# Patient Record
Sex: Female | Born: 1980 | Race: White | Hispanic: No | Smoking: Current every day smoker
Health system: Southern US, Community
[De-identification: ages and names within clinical notes are randomized; demographics above are authoritative.]

## PROBLEM LIST (undated history)

## (undated) ENCOUNTER — Emergency Department: Admission: EM | Payer: Medicare Other | Source: Home / Self Care

## (undated) DIAGNOSIS — R569 Unspecified convulsions: Secondary | ICD-10-CM

## (undated) DIAGNOSIS — O2230 Deep phlebothrombosis in pregnancy, unspecified trimester: Secondary | ICD-10-CM

## (undated) DIAGNOSIS — F32A Depression, unspecified: Secondary | ICD-10-CM

## (undated) DIAGNOSIS — M5136 Other intervertebral disc degeneration, lumbar region: Secondary | ICD-10-CM

## (undated) DIAGNOSIS — F259 Schizoaffective disorder, unspecified: Secondary | ICD-10-CM

## (undated) DIAGNOSIS — M25511 Pain in right shoulder: Secondary | ICD-10-CM

## (undated) DIAGNOSIS — M51369 Other intervertebral disc degeneration, lumbar region without mention of lumbar back pain or lower extremity pain: Secondary | ICD-10-CM

## (undated) DIAGNOSIS — F329 Major depressive disorder, single episode, unspecified: Secondary | ICD-10-CM

---

## 2004-05-22 ENCOUNTER — Emergency Department: Payer: Self-pay | Admitting: General Practice

## 2004-07-02 ENCOUNTER — Emergency Department: Payer: Self-pay | Admitting: Emergency Medicine

## 2004-08-03 ENCOUNTER — Emergency Department: Payer: Self-pay | Admitting: General Practice

## 2005-01-24 ENCOUNTER — Emergency Department: Payer: Self-pay | Admitting: Emergency Medicine

## 2005-02-14 ENCOUNTER — Emergency Department: Payer: Self-pay | Admitting: Emergency Medicine

## 2005-02-16 ENCOUNTER — Ambulatory Visit: Payer: Self-pay | Admitting: Emergency Medicine

## 2005-03-01 ENCOUNTER — Emergency Department: Payer: Self-pay | Admitting: Emergency Medicine

## 2005-03-02 ENCOUNTER — Emergency Department: Payer: Self-pay | Admitting: Emergency Medicine

## 2006-01-09 ENCOUNTER — Observation Stay: Payer: Self-pay

## 2006-01-31 ENCOUNTER — Emergency Department: Payer: Self-pay | Admitting: Emergency Medicine

## 2006-02-06 ENCOUNTER — Emergency Department: Payer: Self-pay | Admitting: Emergency Medicine

## 2006-02-14 ENCOUNTER — Emergency Department: Payer: Self-pay | Admitting: Emergency Medicine

## 2006-03-24 ENCOUNTER — Observation Stay: Payer: Self-pay | Admitting: Obstetrics and Gynecology

## 2006-05-08 ENCOUNTER — Emergency Department: Payer: Self-pay | Admitting: Emergency Medicine

## 2006-05-08 ENCOUNTER — Emergency Department: Payer: Self-pay

## 2006-12-04 ENCOUNTER — Emergency Department: Payer: Self-pay | Admitting: Unknown Physician Specialty

## 2007-11-09 ENCOUNTER — Other Ambulatory Visit: Payer: Self-pay

## 2007-11-09 ENCOUNTER — Emergency Department: Payer: Self-pay | Admitting: Emergency Medicine

## 2007-11-20 ENCOUNTER — Emergency Department: Payer: Self-pay | Admitting: Emergency Medicine

## 2008-06-22 ENCOUNTER — Emergency Department: Payer: Self-pay | Admitting: Emergency Medicine

## 2008-06-30 ENCOUNTER — Emergency Department: Payer: Self-pay | Admitting: Emergency Medicine

## 2008-07-09 ENCOUNTER — Emergency Department: Payer: Self-pay | Admitting: Emergency Medicine

## 2008-07-11 ENCOUNTER — Emergency Department: Payer: Self-pay | Admitting: Emergency Medicine

## 2008-08-10 ENCOUNTER — Ambulatory Visit: Payer: Self-pay | Admitting: Internal Medicine

## 2009-01-12 ENCOUNTER — Ambulatory Visit: Payer: Self-pay | Admitting: Anesthesiology

## 2009-01-20 ENCOUNTER — Encounter: Payer: Self-pay | Admitting: Surgery

## 2009-01-25 ENCOUNTER — Encounter: Payer: Self-pay | Admitting: Surgery

## 2009-02-25 ENCOUNTER — Encounter: Payer: Self-pay | Admitting: Surgery

## 2009-07-15 ENCOUNTER — Encounter: Admission: RE | Admit: 2009-07-15 | Discharge: 2009-07-15 | Payer: Self-pay | Admitting: Family Medicine

## 2009-11-10 ENCOUNTER — Ambulatory Visit: Payer: Self-pay | Admitting: Internal Medicine

## 2009-11-11 ENCOUNTER — Ambulatory Visit: Payer: Self-pay | Admitting: Psychiatry

## 2009-11-26 ENCOUNTER — Inpatient Hospital Stay: Payer: Self-pay | Admitting: Psychiatry

## 2009-12-21 ENCOUNTER — Ambulatory Visit: Payer: Self-pay | Admitting: Family Medicine

## 2010-04-23 ENCOUNTER — Emergency Department: Payer: Self-pay | Admitting: Emergency Medicine

## 2010-08-06 ENCOUNTER — Emergency Department: Payer: Self-pay | Admitting: Emergency Medicine

## 2010-09-17 ENCOUNTER — Inpatient Hospital Stay: Payer: Self-pay | Admitting: Psychiatry

## 2011-05-05 ENCOUNTER — Emergency Department: Payer: Self-pay | Admitting: Emergency Medicine

## 2011-05-10 ENCOUNTER — Emergency Department: Payer: Self-pay | Admitting: Emergency Medicine

## 2011-10-26 ENCOUNTER — Emergency Department: Payer: Self-pay | Admitting: *Deleted

## 2011-12-01 ENCOUNTER — Emergency Department: Payer: Self-pay | Admitting: Emergency Medicine

## 2011-12-01 LAB — CBC
HCT: 41.2 % (ref 35.0–47.0)
HGB: 13.6 g/dL (ref 12.0–16.0)
MCHC: 33.1 g/dL (ref 32.0–36.0)
MCV: 89 fL (ref 80–100)
RBC: 4.61 10*6/uL (ref 3.80–5.20)
RDW: 14.4 % (ref 11.5–14.5)
WBC: 7.7 10*3/uL (ref 3.6–11.0)

## 2011-12-01 LAB — URINALYSIS, COMPLETE
Bilirubin,UR: NEGATIVE
Blood: NEGATIVE
Glucose,UR: NEGATIVE mg/dL (ref 0–75)
Ketone: NEGATIVE
Leukocyte Esterase: NEGATIVE
Ph: 6 (ref 4.5–8.0)
Protein: NEGATIVE
RBC,UR: <1 /HPF (ref 0–5)
Specific Gravity: 1.021 (ref 1.003–1.030)
WBC UR: 1 /HPF (ref 0–5)

## 2011-12-01 LAB — BASIC METABOLIC PANEL
BUN: 6 mg/dL — ABNORMAL LOW (ref 7–18)
Calcium, Total: 8.2 mg/dL — ABNORMAL LOW (ref 8.5–10.1)
Chloride: 110 mmol/L — ABNORMAL HIGH (ref 98–107)
Creatinine: 0.69 mg/dL (ref 0.60–1.30)
EGFR (African American): >60
EGFR (Non-African Amer.): >60
Osmolality: 282 (ref 275–301)
Potassium: 3.7 mmol/L (ref 3.5–5.1)
Sodium: 142 mmol/L (ref 136–145)

## 2011-12-01 LAB — WET PREP, GENITAL

## 2012-02-02 ENCOUNTER — Emergency Department: Payer: Self-pay | Admitting: Emergency Medicine

## 2012-02-02 LAB — COMPREHENSIVE METABOLIC PANEL
Alkaline Phosphatase: 99 U/L (ref 50–136)
Calcium, Total: 9 mg/dL (ref 8.5–10.1)
Chloride: 113 mmol/L — ABNORMAL HIGH (ref 98–107)
Co2: 24 mmol/L (ref 21–32)
EGFR (African American): >60
EGFR (Non-African Amer.): >60
SGPT (ALT): 15 U/L (ref 12–78)

## 2012-02-02 LAB — URINALYSIS, COMPLETE
Bilirubin,UR: NEGATIVE
Blood: NEGATIVE
Leukocyte Esterase: NEGATIVE
Ph: 6 (ref 4.5–8.0)
Protein: NEGATIVE
Squamous Epithelial: <1

## 2012-02-02 LAB — CBC
HGB: 13.7 g/dL (ref 12.0–16.0)
MCH: 29.9 pg (ref 26.0–34.0)
MCHC: 34 g/dL (ref 32.0–36.0)
Platelet: 211 10*3/uL (ref 150–440)
RDW: 13.3 % (ref 11.5–14.5)

## 2012-02-02 LAB — DRUG SCREEN, URINE
Amphetamines, Ur Screen: NEGATIVE (ref ?–1000)
Barbiturates, Ur Screen: NEGATIVE (ref ?–200)
Cocaine Metabolite,Ur ~~LOC~~: NEGATIVE (ref ?–300)
Tricyclic, Ur Screen: NEGATIVE (ref ?–1000)

## 2012-02-02 LAB — ETHANOL: Ethanol %: <0.003 % (ref 0.000–0.080)

## 2012-03-05 ENCOUNTER — Emergency Department: Payer: Self-pay | Admitting: *Deleted

## 2012-03-05 LAB — COMPREHENSIVE METABOLIC PANEL
Anion Gap: 15 (ref 7–16)
Bilirubin,Total: 0.3 mg/dL (ref 0.2–1.0)
Calcium, Total: 8.8 mg/dL (ref 8.5–10.1)
EGFR (African American): 52 — ABNORMAL LOW
EGFR (Non-African Amer.): 45 — ABNORMAL LOW
Glucose: 222 mg/dL — ABNORMAL HIGH (ref 65–99)
Osmolality: 285 (ref 275–301)
Potassium: 4 mmol/L (ref 3.5–5.1)
Sodium: 141 mmol/L (ref 136–145)

## 2012-03-05 LAB — T4, FREE: Free Thyroxine: 1.12 ng/dL

## 2012-03-05 LAB — DRUG SCREEN, URINE
Amphetamines, Ur Screen: POSITIVE (ref ?–1000)
Cannabinoid 50 Ng, Ur ~~LOC~~: NEGATIVE (ref ?–50)
Methadone, Ur Screen: POSITIVE (ref ?–300)
Opiate, Ur Screen: POSITIVE (ref ?–300)
Phencyclidine (PCP) Ur S: NEGATIVE (ref ?–25)

## 2012-03-05 LAB — URINALYSIS, COMPLETE
Bilirubin,UR: NEGATIVE
Granular Cast: 3
Ketone: NEGATIVE
Ph: 5 (ref 4.5–8.0)
RBC,UR: 2 /HPF (ref 0–5)

## 2012-03-05 LAB — ACETAMINOPHEN LEVEL: Acetaminophen: <2 ug/mL

## 2012-03-05 LAB — SALICYLATE LEVEL: Salicylates, Serum: 2.4 mg/dL

## 2012-03-05 LAB — CBC
HCT: 43.2 % (ref 35.0–47.0)
HGB: 14.4 g/dL (ref 12.0–16.0)
WBC: 16.6 10*3/uL — ABNORMAL HIGH (ref 3.6–11.0)

## 2012-03-05 LAB — PREGNANCY, URINE: Pregnancy Test, Urine: NEGATIVE m[IU]/mL

## 2012-04-16 ENCOUNTER — Ambulatory Visit: Payer: Self-pay | Admitting: Internal Medicine

## 2012-05-06 ENCOUNTER — Inpatient Hospital Stay: Payer: Self-pay | Admitting: Internal Medicine

## 2012-05-06 LAB — CBC
HCT: 41.3 % (ref 35.0–47.0)
MCH: 29.8 pg (ref 26.0–34.0)
MCHC: 33.9 g/dL (ref 32.0–36.0)
MCV: 88 fL (ref 80–100)
Platelet: 218 10*3/uL (ref 150–440)
RDW: 14.1 % (ref 11.5–14.5)
WBC: 9.3 10*3/uL (ref 3.6–11.0)

## 2012-05-06 LAB — URINALYSIS, COMPLETE
Bilirubin,UR: NEGATIVE
Blood: NEGATIVE
Glucose,UR: NEGATIVE mg/dL (ref 0–75)
Ketone: NEGATIVE
Specific Gravity: 1.028 (ref 1.003–1.030)
Squamous Epithelial: <1

## 2012-05-06 LAB — DRUG SCREEN, URINE
Amphetamines, Ur Screen: POSITIVE (ref ?–1000)
Cocaine Metabolite,Ur ~~LOC~~: NEGATIVE (ref ?–300)
Methadone, Ur Screen: NEGATIVE (ref ?–300)
Opiate, Ur Screen: POSITIVE (ref ?–300)
Phencyclidine (PCP) Ur S: NEGATIVE (ref ?–25)
Tricyclic, Ur Screen: POSITIVE (ref ?–1000)

## 2012-05-06 LAB — PREGNANCY, URINE: Pregnancy Test, Urine: NEGATIVE m[IU]/mL

## 2012-05-06 LAB — COMPREHENSIVE METABOLIC PANEL
Anion Gap: 16 (ref 7–16)
Calcium, Total: 8.6 mg/dL (ref 8.5–10.1)
Chloride: 107 mmol/L (ref 98–107)
EGFR (African American): >60
Potassium: 3.6 mmol/L (ref 3.5–5.1)
SGOT(AST): 14 U/L — ABNORMAL LOW (ref 15–37)
Total Protein: 7.6 g/dL (ref 6.4–8.2)

## 2012-05-06 LAB — ETHANOL
Ethanol %: <0.003 %
Ethanol: <3 mg/dL

## 2012-05-06 LAB — ACETAMINOPHEN LEVEL: Acetaminophen: <2 ug/mL

## 2012-05-06 LAB — TROPONIN I: Troponin-I: <0.02 ng/mL

## 2012-05-06 LAB — HCG, QUANTITATIVE, PREGNANCY: Beta Hcg, Quant.: <1 m[IU]/mL — ABNORMAL LOW

## 2012-05-06 LAB — SALICYLATE LEVEL: Salicylates, Serum: 9.8 mg/dL — ABNORMAL HIGH

## 2012-05-07 LAB — CBC WITH DIFFERENTIAL/PLATELET
Basophil %: 0.8 %
Eosinophil #: 0.1 10*3/uL (ref 0.0–0.7)
HCT: 35.5 % (ref 35.0–47.0)
HGB: 11.9 g/dL — ABNORMAL LOW (ref 12.0–16.0)
Lymphocyte %: 31.2 %
MCHC: 33.6 g/dL (ref 32.0–36.0)
Monocyte %: 7 %
Neutrophil %: 59.2 %
RBC: 4.07 10*6/uL (ref 3.80–5.20)
WBC: 6.6 10*3/uL (ref 3.6–11.0)

## 2012-05-07 LAB — BASIC METABOLIC PANEL
Calcium, Total: 8 mg/dL — ABNORMAL LOW (ref 8.5–10.1)
EGFR (African American): >60
EGFR (Non-African Amer.): >60
Glucose: 75 mg/dL (ref 65–99)
Sodium: 141 mmol/L (ref 136–145)

## 2012-05-08 ENCOUNTER — Ambulatory Visit: Payer: Self-pay | Admitting: Internal Medicine

## 2012-05-13 ENCOUNTER — Emergency Department: Payer: Self-pay | Admitting: Unknown Physician Specialty

## 2012-05-13 LAB — DRUG SCREEN, URINE
Barbiturates, Ur Screen: NEGATIVE (ref ?–200)
Methadone, Ur Screen: NEGATIVE (ref ?–300)
Tricyclic, Ur Screen: NEGATIVE (ref ?–1000)

## 2012-05-13 LAB — URINALYSIS, COMPLETE
Blood: NEGATIVE
Leukocyte Esterase: NEGATIVE
Nitrite: NEGATIVE
Ph: 8 (ref 4.5–8.0)
RBC,UR: NONE SEEN /HPF (ref 0–5)

## 2012-05-13 LAB — PREGNANCY, URINE: Pregnancy Test, Urine: NEGATIVE m[IU]/mL

## 2012-05-13 LAB — COMPREHENSIVE METABOLIC PANEL
Albumin: 3.7 g/dL (ref 3.4–5.0)
Anion Gap: 6 — ABNORMAL LOW (ref 7–16)
BUN: 14 mg/dL (ref 7–18)
Chloride: 110 mmol/L — ABNORMAL HIGH (ref 98–107)
EGFR (African American): >60
Glucose: 98 mg/dL (ref 65–99)
Osmolality: 285 (ref 275–301)
Potassium: 3.9 mmol/L (ref 3.5–5.1)
SGOT(AST): 19 U/L (ref 15–37)
Sodium: 143 mmol/L (ref 136–145)
Total Protein: 6.9 g/dL (ref 6.4–8.2)

## 2012-05-13 LAB — CBC
MCV: 87 fL (ref 80–100)
Platelet: 244 10*3/uL (ref 150–440)
RBC: 4.41 10*6/uL (ref 3.80–5.20)
RDW: 14.5 % (ref 11.5–14.5)
WBC: 5.3 10*3/uL (ref 3.6–11.0)

## 2012-05-13 LAB — SALICYLATE LEVEL: Salicylates, Serum: 3.2 mg/dL — ABNORMAL HIGH

## 2012-05-13 LAB — LIPASE, BLOOD: Lipase: 106 U/L (ref 73–393)

## 2012-06-03 ENCOUNTER — Emergency Department: Payer: Self-pay | Admitting: Emergency Medicine

## 2012-06-03 LAB — CBC
MCH: 29.8 pg (ref 26.0–34.0)
MCHC: 33.5 g/dL (ref 32.0–36.0)
Platelet: 195 10*3/uL (ref 150–440)
WBC: 7.4 10*3/uL (ref 3.6–11.0)

## 2012-06-03 LAB — PREGNANCY, URINE: Pregnancy Test, Urine: NEGATIVE m[IU]/mL

## 2012-06-03 LAB — COMPREHENSIVE METABOLIC PANEL
Albumin: 3.9 g/dL (ref 3.4–5.0)
Alkaline Phosphatase: 103 U/L (ref 50–136)
Anion Gap: 9 (ref 7–16)
Bilirubin,Total: 0.3 mg/dL (ref 0.2–1.0)
Calcium, Total: 8.3 mg/dL — ABNORMAL LOW (ref 8.5–10.1)
Chloride: 110 mmol/L — ABNORMAL HIGH (ref 98–107)
Co2: 21 mmol/L (ref 21–32)
Creatinine: 0.84 mg/dL (ref 0.60–1.30)
EGFR (African American): >60
EGFR (Non-African Amer.): >60
Glucose: 123 mg/dL — ABNORMAL HIGH (ref 65–99)
Osmolality: 280 (ref 275–301)
SGOT(AST): 20 U/L (ref 15–37)
Sodium: 140 mmol/L (ref 136–145)

## 2012-06-03 LAB — URINALYSIS, COMPLETE
Bilirubin,UR: NEGATIVE
Blood: NEGATIVE
Ketone: NEGATIVE
Ph: 5 (ref 4.5–8.0)
RBC,UR: <1 /HPF (ref 0–5)
Specific Gravity: 1.031 (ref 1.003–1.030)
Squamous Epithelial: 2
WBC UR: 3 /HPF (ref 0–5)

## 2012-06-03 LAB — DRUG SCREEN, URINE
Barbiturates, Ur Screen: NEGATIVE (ref ?–200)
Benzodiazepine, Ur Scrn: NEGATIVE (ref ?–200)
Cannabinoid 50 Ng, Ur ~~LOC~~: NEGATIVE (ref ?–50)
Cocaine Metabolite,Ur ~~LOC~~: NEGATIVE (ref ?–300)
Opiate, Ur Screen: POSITIVE (ref ?–300)
Phencyclidine (PCP) Ur S: NEGATIVE (ref ?–25)

## 2012-07-04 ENCOUNTER — Ambulatory Visit: Payer: Self-pay | Admitting: Family Medicine

## 2012-07-26 ENCOUNTER — Emergency Department: Payer: Self-pay | Admitting: Emergency Medicine

## 2012-07-26 LAB — DRUG SCREEN, URINE
Amphetamines, Ur Screen: NEGATIVE (ref ?–1000)
Barbiturates, Ur Screen: NEGATIVE (ref ?–200)
Benzodiazepine, Ur Scrn: NEGATIVE (ref ?–200)
Cannabinoid 50 Ng, Ur ~~LOC~~: NEGATIVE (ref ?–50)
MDMA (Ecstasy)Ur Screen: NEGATIVE (ref ?–500)
Methadone, Ur Screen: NEGATIVE (ref ?–300)
Opiate, Ur Screen: NEGATIVE (ref ?–300)
Phencyclidine (PCP) Ur S: NEGATIVE (ref ?–25)

## 2012-07-26 LAB — CBC
HCT: 40 % (ref 35.0–47.0)
MCH: 30.4 pg (ref 26.0–34.0)
MCV: 91 fL (ref 80–100)
Platelet: 288 10*3/uL (ref 150–440)
RDW: 13.8 % (ref 11.5–14.5)
WBC: 8.5 10*3/uL (ref 3.6–11.0)

## 2012-07-26 LAB — COMPREHENSIVE METABOLIC PANEL
Alkaline Phosphatase: 110 U/L (ref 50–136)
Anion Gap: 8 (ref 7–16)
BUN: 7 mg/dL (ref 7–18)
Calcium, Total: 8.7 mg/dL (ref 8.5–10.1)
Co2: 24 mmol/L (ref 21–32)
Creatinine: 0.76 mg/dL (ref 0.60–1.30)
EGFR (Non-African Amer.): >60
Osmolality: 273 (ref 275–301)
Potassium: 3.7 mmol/L (ref 3.5–5.1)
Total Protein: 7.8 g/dL (ref 6.4–8.2)

## 2012-07-26 LAB — ETHANOL: Ethanol %: <0.003 % (ref 0.000–0.080)

## 2012-07-26 LAB — URINALYSIS, COMPLETE
Bilirubin,UR: NEGATIVE
Blood: NEGATIVE
Glucose,UR: NEGATIVE mg/dL (ref 0–75)
Leukocyte Esterase: NEGATIVE
Ph: 7 (ref 4.5–8.0)
RBC,UR: <1 /HPF (ref 0–5)
Specific Gravity: 1.016 (ref 1.003–1.030)
Squamous Epithelial: 1

## 2012-08-02 ENCOUNTER — Ambulatory Visit: Payer: Self-pay | Admitting: Internal Medicine

## 2012-10-30 ENCOUNTER — Ambulatory Visit: Payer: Self-pay | Admitting: Family Medicine

## 2012-10-30 LAB — PREGNANCY, URINE: Pregnancy Test, Urine: NEGATIVE m[IU]/mL

## 2012-10-30 LAB — URINALYSIS, COMPLETE
Bacteria: NEGATIVE
Glucose,UR: NEGATIVE mg/dL (ref 0–75)
Leukocyte Esterase: NEGATIVE
Nitrite: NEGATIVE
Specific Gravity: 1.015 (ref 1.003–1.030)

## 2012-10-30 LAB — WET PREP, GENITAL

## 2012-10-30 LAB — CBC WITH DIFFERENTIAL/PLATELET
Eosinophil #: 0.1 10*3/uL (ref 0.0–0.7)
Eosinophil %: 1.3 %
HCT: 39.4 % (ref 35.0–47.0)
HGB: 13.2 g/dL (ref 12.0–16.0)
Lymphocyte #: 1.9 10*3/uL (ref 1.0–3.6)
MCV: 86 fL (ref 80–100)
Monocyte #: 0.4 x10 3/mm (ref 0.2–0.9)
Neutrophil %: 56.3 %
Platelet: 178 10*3/uL (ref 150–440)
RBC: 4.59 10*6/uL (ref 3.80–5.20)
RDW: 12.7 % (ref 11.5–14.5)

## 2012-11-27 ENCOUNTER — Ambulatory Visit: Payer: Self-pay

## 2012-11-27 LAB — URINALYSIS, COMPLETE
Bilirubin,UR: NEGATIVE
Ketone: NEGATIVE
Nitrite: POSITIVE
Protein: 30
Specific Gravity: 1.01 (ref 1.003–1.030)

## 2012-12-10 LAB — COMPREHENSIVE METABOLIC PANEL
Albumin: 3.2 g/dL — ABNORMAL LOW (ref 3.4–5.0)
Alkaline Phosphatase: 96 U/L (ref 50–136)
BUN: 6 mg/dL — ABNORMAL LOW (ref 7–18)
Bilirubin,Total: 0.3 mg/dL (ref 0.2–1.0)
Calcium, Total: 8.8 mg/dL (ref 8.5–10.1)
Chloride: 105 mmol/L (ref 98–107)
Co2: 28 mmol/L (ref 21–32)
EGFR (African American): >60
EGFR (Non-African Amer.): >60
Osmolality: 274 (ref 275–301)
Potassium: 3.2 mmol/L — ABNORMAL LOW (ref 3.5–5.1)
SGOT(AST): 12 U/L — ABNORMAL LOW (ref 15–37)
SGPT (ALT): 12 U/L (ref 12–78)
Total Protein: 7.1 g/dL (ref 6.4–8.2)

## 2012-12-10 LAB — DRUG SCREEN, URINE
Amphetamines, Ur Screen: NEGATIVE (ref ?–1000)
Barbiturates, Ur Screen: NEGATIVE (ref ?–200)
Cannabinoid 50 Ng, Ur ~~LOC~~: NEGATIVE (ref ?–50)
Cocaine Metabolite,Ur ~~LOC~~: NEGATIVE (ref ?–300)
MDMA (Ecstasy)Ur Screen: NEGATIVE (ref ?–500)
Methadone, Ur Screen: NEGATIVE (ref ?–300)
Phencyclidine (PCP) Ur S: NEGATIVE (ref ?–25)
Tricyclic, Ur Screen: POSITIVE (ref ?–1000)

## 2012-12-10 LAB — CBC
HCT: 34.8 % — ABNORMAL LOW (ref 35.0–47.0)
HGB: 11.9 g/dL — ABNORMAL LOW (ref 12.0–16.0)
MCHC: 34.2 g/dL (ref 32.0–36.0)
MCV: 84 fL (ref 80–100)
Platelet: 298 10*3/uL (ref 150–440)

## 2012-12-10 LAB — ETHANOL: Ethanol: <3 mg/dL

## 2012-12-10 LAB — URINALYSIS, COMPLETE
Bacteria: NONE SEEN
Bilirubin,UR: NEGATIVE
Glucose,UR: NEGATIVE mg/dL (ref 0–75)
Hyaline Cast: 7
Ketone: NEGATIVE
Nitrite: NEGATIVE
Ph: 5 (ref 4.5–8.0)
RBC,UR: 1 /HPF (ref 0–5)
Specific Gravity: 1.015 (ref 1.003–1.030)
Squamous Epithelial: 3

## 2012-12-10 LAB — SALICYLATE LEVEL: Salicylates, Serum: 3.5 mg/dL — ABNORMAL HIGH

## 2012-12-11 ENCOUNTER — Inpatient Hospital Stay: Payer: Self-pay | Admitting: Psychiatry

## 2012-12-12 LAB — BEHAVIORAL MEDICINE 1 PANEL
Albumin: 3.1 g/dL — ABNORMAL LOW (ref 3.4–5.0)
Alkaline Phosphatase: 92 U/L (ref 50–136)
Anion Gap: 4 — ABNORMAL LOW (ref 7–16)
BUN: 16 mg/dL (ref 7–18)
Basophil #: 0.1 10*3/uL (ref 0.0–0.1)
Basophil %: 1.8 %
Bilirubin,Total: 0.3 mg/dL (ref 0.2–1.0)
Calcium, Total: 8.3 mg/dL — ABNORMAL LOW (ref 8.5–10.1)
Chloride: 107 mmol/L (ref 98–107)
Co2: 30 mmol/L (ref 21–32)
Creatinine: 0.89 mg/dL (ref 0.60–1.30)
EGFR (African American): >60
EGFR (Non-African Amer.): >60
Eosinophil #: 0.2 10*3/uL (ref 0.0–0.7)
Eosinophil %: 3.8 %
Glucose: 106 mg/dL — ABNORMAL HIGH (ref 65–99)
HCT: 35.9 % (ref 35.0–47.0)
HGB: 11.9 g/dL — ABNORMAL LOW (ref 12.0–16.0)
Lymphocyte #: 2.7 10*3/uL (ref 1.0–3.6)
Lymphocyte %: 43.9 %
MCH: 28.4 pg (ref 26.0–34.0)
MCHC: 33.1 g/dL (ref 32.0–36.0)
MCV: 86 fL (ref 80–100)
Monocyte #: 0.4 x10 3/mm (ref 0.2–0.9)
Monocyte %: 6.6 %
Neutrophil #: 2.7 10*3/uL (ref 1.4–6.5)
Neutrophil %: 43.9 %
Osmolality: 283 (ref 275–301)
Platelet: 304 10*3/uL (ref 150–440)
Potassium: 4.1 mmol/L (ref 3.5–5.1)
RBC: 4.18 10*6/uL (ref 3.80–5.20)
RDW: 13.8 % (ref 11.5–14.5)
SGOT(AST): 13 U/L — ABNORMAL LOW (ref 15–37)
SGPT (ALT): 12 U/L (ref 12–78)
Sodium: 141 mmol/L (ref 136–145)
Thyroid Stimulating Horm: 0.507 u[IU]/mL
Total Protein: 6.3 g/dL — ABNORMAL LOW (ref 6.4–8.2)
WBC: 6.3 10*3/uL (ref 3.6–11.0)

## 2012-12-21 ENCOUNTER — Emergency Department: Payer: Self-pay | Admitting: Emergency Medicine

## 2012-12-21 LAB — COMPREHENSIVE METABOLIC PANEL
Anion Gap: 5 — ABNORMAL LOW (ref 7–16)
Co2: 27 mmol/L (ref 21–32)
EGFR (African American): >60
EGFR (Non-African Amer.): >60
Glucose: 105 mg/dL — ABNORMAL HIGH (ref 65–99)
Osmolality: 285 (ref 275–301)
SGOT(AST): 18 U/L (ref 15–37)
Sodium: 143 mmol/L (ref 136–145)

## 2012-12-21 LAB — URINALYSIS, COMPLETE
Bilirubin,UR: NEGATIVE
Blood: NEGATIVE
Ketone: NEGATIVE
Leukocyte Esterase: NEGATIVE
Nitrite: NEGATIVE
Ph: 5 (ref 4.5–8.0)
RBC,UR: 1 /HPF (ref 0–5)
Squamous Epithelial: <1
WBC UR: 2 /HPF (ref 0–5)

## 2012-12-21 LAB — CBC
MCH: 28.8 pg (ref 26.0–34.0)
MCV: 86 fL (ref 80–100)
Platelet: 196 10*3/uL (ref 150–440)
RBC: 3.42 10*6/uL — ABNORMAL LOW (ref 3.80–5.20)
RDW: 14.2 % (ref 11.5–14.5)

## 2012-12-21 LAB — PREGNANCY, URINE: Pregnancy Test, Urine: NEGATIVE m[IU]/mL

## 2013-01-20 LAB — DRUG SCREEN, URINE
Amphetamines, Ur Screen: NEGATIVE (ref ?–1000)
Barbiturates, Ur Screen: NEGATIVE (ref ?–200)
Cannabinoid 50 Ng, Ur ~~LOC~~: NEGATIVE (ref ?–50)
Cocaine Metabolite,Ur ~~LOC~~: NEGATIVE (ref ?–300)
MDMA (Ecstasy)Ur Screen: NEGATIVE (ref ?–500)
Phencyclidine (PCP) Ur S: NEGATIVE (ref ?–25)

## 2013-01-20 LAB — CBC
HCT: 31.3 % — ABNORMAL LOW (ref 35.0–47.0)
MCHC: 33.6 g/dL (ref 32.0–36.0)
Platelet: 214 10*3/uL (ref 150–440)
RDW: 14.9 % — ABNORMAL HIGH (ref 11.5–14.5)

## 2013-01-20 LAB — URINALYSIS, COMPLETE
Bacteria: NONE SEEN
Bilirubin,UR: NEGATIVE
Blood: NEGATIVE
Glucose,UR: NEGATIVE mg/dL
Ketone: NEGATIVE
Leukocyte Esterase: NEGATIVE
Nitrite: NEGATIVE
Ph: 5
Protein: NEGATIVE
RBC,UR: <1 /HPF
Specific Gravity: 1.016
Squamous Epithelial: NONE SEEN
WBC UR: 1 /HPF

## 2013-01-20 LAB — COMPREHENSIVE METABOLIC PANEL
Albumin: 3.3 g/dL — ABNORMAL LOW (ref 3.4–5.0)
Alkaline Phosphatase: 90 U/L (ref 50–136)
Bilirubin,Total: 0.2 mg/dL (ref 0.2–1.0)
Calcium, Total: 8.5 mg/dL (ref 8.5–10.1)
Creatinine: 0.71 mg/dL (ref 0.60–1.30)
Potassium: 3.3 mmol/L — ABNORMAL LOW (ref 3.5–5.1)

## 2013-01-20 LAB — TSH: Thyroid Stimulating Horm: 2.57 u[IU]/mL

## 2013-01-20 LAB — ETHANOL
Ethanol %: <0.003 %
Ethanol: <3 mg/dL

## 2013-01-20 LAB — SALICYLATE LEVEL: Salicylates, Serum: 3.5 mg/dL — ABNORMAL HIGH

## 2013-01-20 LAB — CARBAMAZEPINE LEVEL, TOTAL: Carbamazepine: 9.2 ug/mL

## 2013-01-20 LAB — ACETAMINOPHEN LEVEL: Acetaminophen: <2 ug/mL

## 2013-01-21 ENCOUNTER — Inpatient Hospital Stay: Payer: Self-pay | Admitting: Psychiatry

## 2013-01-22 LAB — BEHAVIORAL MEDICINE 1 PANEL
Alkaline Phosphatase: 78 U/L (ref 50–136)
Anion Gap: 5 — ABNORMAL LOW (ref 7–16)
BUN: 14 mg/dL (ref 7–18)
Bilirubin,Total: 0.2 mg/dL (ref 0.2–1.0)
Chloride: 107 mmol/L (ref 98–107)
Co2: 27 mmol/L (ref 21–32)
Creatinine: 0.73 mg/dL (ref 0.60–1.30)
EGFR (African American): >60
EGFR (Non-African Amer.): >60
Eosinophil #: 0.3 10*3/uL (ref 0.0–0.7)
Glucose: 95 mg/dL (ref 65–99)
HCT: 34.1 % — ABNORMAL LOW (ref 35.0–47.0)
MCHC: 32.3 g/dL (ref 32.0–36.0)
MCV: 85 fL (ref 80–100)
Monocyte #: 0.3 x10 3/mm (ref 0.2–0.9)
Neutrophil %: 35.1 %
Osmolality: 278 (ref 275–301)
Platelet: 257 10*3/uL (ref 150–440)
Potassium: 4 mmol/L (ref 3.5–5.1)
RBC: 4.02 10*6/uL (ref 3.80–5.20)
RDW: 15 % — ABNORMAL HIGH (ref 11.5–14.5)
SGOT(AST): 15 U/L (ref 15–37)
Sodium: 139 mmol/L (ref 136–145)
Thyroid Stimulating Horm: 1.26 u[IU]/mL
Total Protein: 6.4 g/dL (ref 6.4–8.2)

## 2013-01-22 LAB — URINALYSIS, COMPLETE
Bacteria: NONE SEEN
Bilirubin,UR: NEGATIVE
Glucose,UR: NEGATIVE mg/dL (ref 0–75)
Leukocyte Esterase: NEGATIVE
Protein: NEGATIVE
RBC,UR: NONE SEEN /HPF (ref 0–5)
Specific Gravity: 1.02 (ref 1.003–1.030)
Squamous Epithelial: 4
WBC UR: NONE SEEN /HPF (ref 0–5)

## 2013-06-11 ENCOUNTER — Emergency Department: Payer: Self-pay | Admitting: Emergency Medicine

## 2013-06-11 LAB — CBC WITH DIFFERENTIAL/PLATELET
Basophil #: 0.1 10*3/uL (ref 0.0–0.1)
Eosinophil %: 4 %
HGB: 13.2 g/dL (ref 12.0–16.0)
Lymphocyte #: 2.8 10*3/uL (ref 1.0–3.6)
MCH: 29.5 pg (ref 26.0–34.0)
MCHC: 34.4 g/dL (ref 32.0–36.0)
Monocyte #: 0.5 x10 3/mm (ref 0.2–0.9)
Monocyte %: 8.3 %
Neutrophil #: 2.7 10*3/uL (ref 1.4–6.5)
Neutrophil %: 42.7 %
RDW: 13.4 % (ref 11.5–14.5)
WBC: 6.4 10*3/uL (ref 3.6–11.0)

## 2013-06-11 LAB — COMPREHENSIVE METABOLIC PANEL
Albumin: 3.2 g/dL — ABNORMAL LOW (ref 3.4–5.0)
Alkaline Phosphatase: 89 U/L
Anion Gap: 6 — ABNORMAL LOW (ref 7–16)
Bilirubin,Total: 0.1 mg/dL — ABNORMAL LOW (ref 0.2–1.0)
Chloride: 111 mmol/L — ABNORMAL HIGH (ref 98–107)
Co2: 22 mmol/L (ref 21–32)
Glucose: 123 mg/dL — ABNORMAL HIGH (ref 65–99)
Osmolality: 277 (ref 275–301)
SGOT(AST): 21 U/L (ref 15–37)
Total Protein: 6.6 g/dL (ref 6.4–8.2)

## 2013-06-12 LAB — URINALYSIS, COMPLETE
Blood: NEGATIVE
Glucose,UR: NEGATIVE mg/dL (ref 0–75)
Ketone: NEGATIVE
Leukocyte Esterase: NEGATIVE
Nitrite: POSITIVE
Specific Gravity: 1.024 (ref 1.003–1.030)
Squamous Epithelial: 1

## 2013-06-12 LAB — PREGNANCY, URINE: Pregnancy Test, Urine: NEGATIVE m[IU]/mL

## 2013-06-14 LAB — URINE CULTURE

## 2014-07-10 ENCOUNTER — Ambulatory Visit: Payer: Self-pay | Admitting: Physician Assistant

## 2014-07-10 LAB — GC/CHLAMYDIA PROBE AMP

## 2014-07-10 LAB — URINALYSIS, COMPLETE
BILIRUBIN, UR: NEGATIVE
Glucose,UR: NEGATIVE
KETONE: NEGATIVE
Nitrite: NEGATIVE
PH: 7.5 (ref 5.0–8.0)
PROTEIN: NEGATIVE
Specific Gravity: 1.02 (ref 1.000–1.030)

## 2014-07-10 LAB — WET PREP, GENITAL

## 2014-10-07 ENCOUNTER — Ambulatory Visit
Admit: 2014-10-07 | Disposition: A | Discharge status: Home / Self Care | Payer: Self-pay | Attending: Physician Assistant | Admitting: Physician Assistant

## 2014-10-07 ENCOUNTER — Ambulatory Visit
Admit: 2014-10-07 | Disposition: A | Discharge status: Home / Self Care | Payer: Self-pay | Attending: Family Medicine | Admitting: Family Medicine

## 2014-10-07 LAB — URINALYSIS, COMPLETE
BILIRUBIN, UR: NEGATIVE
Bacteria: NEGATIVE
Blood: NEGATIVE
Glucose,UR: NEGATIVE
Ketone: NEGATIVE
Leukocyte Esterase: NEGATIVE
NITRITE: NEGATIVE
Ph: 7 (ref 5.0–8.0)
Specific Gravity: >=1.03 (ref 1.000–1.030)
WBC UR: NONE SEEN /HPF (ref 0–5)

## 2014-10-07 LAB — CBC WITH DIFFERENTIAL/PLATELET
BASOS PCT: 1.2 %
Basophil #: 0.1 10*3/uL (ref 0.0–0.1)
Eosinophil #: 0.1 10*3/uL (ref 0.0–0.7)
Eosinophil %: 0.8 %
HCT: 37.7 % (ref 35.0–47.0)
HGB: 12.9 g/dL (ref 12.0–16.0)
LYMPHS PCT: 27.5 %
Lymphocyte #: 2.1 10*3/uL (ref 1.0–3.6)
MCH: 29.4 pg (ref 26.0–34.0)
MCHC: 34.1 g/dL (ref 32.0–36.0)
MCV: 86 fL (ref 80–100)
Monocyte #: 0.4 x10 3/mm (ref 0.2–0.9)
Monocyte %: 4.7 %
NEUTROS PCT: 65.8 %
Neutrophil #: 5 10*3/uL (ref 1.4–6.5)
Platelet: 240 10*3/uL (ref 150–440)
RBC: 4.37 10*6/uL (ref 3.80–5.20)
RDW: 13.4 % (ref 11.5–14.5)
WBC: 7.5 10*3/uL (ref 3.6–11.0)

## 2014-10-07 LAB — COMPREHENSIVE METABOLIC PANEL
ALBUMIN: 3.5 g/dL
ALT: 10 U/L — AB
AST: 15 U/L
Alkaline Phosphatase: 85 U/L
Anion Gap: 8 (ref 7–16)
BILIRUBIN TOTAL: 0.3 mg/dL
BUN: 12 mg/dL
CHLORIDE: 109 mmol/L
CREATININE: 0.69 mg/dL
Calcium, Total: 8.5 mg/dL — ABNORMAL LOW
Co2: 21 mmol/L — ABNORMAL LOW
EGFR (African American): >60
EGFR (Non-African Amer.): >60
GLUCOSE: 100 mg/dL — AB
Potassium: 3.9 mmol/L
Sodium: 138 mmol/L
TOTAL PROTEIN: 6.7 g/dL

## 2014-10-14 NOTE — Consult Note (Signed)
Brief Consult Note: Diagnosis: Bipolar affective disorder, Polysubstance dependence.   Patient was seen by consultant.   Consult note dictated.   Recommend further assessment or treatment.   Orders entered.   Discussed with Attending MD.   Comments: [name] run out of medications of Seroquel XR 300 mg at bedtime and Clonazepam 2 mg twice daily prescribed by Dr. Janeece RiggersSu. She was admitted ghere for seizures.  PLAN: 1. Please continue Clonazepam 1 mg twice daily.  2. She will see Dr. Janeece RiggersSu on 11/18 at 9:30 am to manage all her other medications.   3. I will sign off..  Electronic Signatures: Kristine LineaPucilowska, Dannel Rafter (MD)  (Signed 11-Nov-13 13:14)  Authored: Brief Consult Note   Last Updated: 11-Nov-13 13:14 by Kristine LineaPucilowska, Olanrewaju Osborn (MD)
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
## 2014-10-14 NOTE — Consult Note (Signed)
Brief Consult Note: Diagnosis: Bipolar disorder depressed, opiod dependence.   Patient was seen by consultant.   Consult note dictated.   Recommend further assessment or treatment.   Orders entered.   Discussed with Attending MD.   Comments: [name] has a h/o bipolar disorder and narcotic dependence. She was brought to the ER after "accidently" ingesting a fentanyl patch. She is not suicidal or homicidal.    PLAN: 1. The patient no longer meets criteria for IVC. I will terminate proceedings. Please discharge as appropriate.  2. She is to continue all medications as prescribed by Dr. Janeece Jimenez.  3. She will follow up with Dr. Janeece Jimenez tomorrow at 1:00.  4. The patient denies narcotic abuse but has been getting them from the streets. She has been fired from pain clinic and needs to establish care with a new provider.   5. Her father will pick her up..  Electronic Signatures: [name], [name] (MD)  (Signed 09-Sep-13 15:29)  Authored: Brief Consult Note   Last Updated: 09-Sep-13 15:29 by [name], [name] (MD)

## 2014-10-14 NOTE — Discharge Summary (Signed)
PATIENT NAME:  [name], [name] MR#:  [id number] DATE OF BIRTH:  [birth year]  DATE OF ADMISSION:  05/06/2012 DATE OF DISCHARGE:  05/07/2012  CONSULTATIONS:  1. Neurological, Dr. Katrinka BlazingSmith. 2. Dr. Jennet MaduroPucilowska.   PRIMARY CARE PHYSICIAN: Dr. Mylinda LatinaVines   DISCHARGE DIAGNOSES:  1. Epilepsy. 2. Depression, anxiety.   CODE STATUS: FULL CODE.   CONDITION: Stable   HOME MEDICATIONS:  1. [name] daily.  2. [name] b.i.d.  3. [name] daily b.i.d. for 14 days then 50 mg Jimenez.o. b.i.d. for seven days then 75 mg Jimenez.o. b.i.d. for seven days then 100 mg Jimenez.o. daily.  STOP MEDICATION: [name] b.i.d.   DIET: Regular.   ACTIVITY: As tolerated.   FOLLOW-UP CARE: Follow up with PCP within 1 to 2 weeks. Follow up with Dr. Katrinka BlazingSmith, neurology, within three months.   HOSPITAL COURSE: Patient is a 34 year old Caucasian female with history of seizure disorder, bipolar disorder, chronic pain issue with narcotic dependence was brought to the ED due to seizure. Patient received 3 mg IV Ativan for seizure in ED and was not responding. For a detailed history and physical examination, please refer to the admission note dictated by Dr. Nemiah CommanderKalisetti. Patient was admitted for seizure activity possibly due to benzodiazepine withdraw because patient ran out of Klonopin for five days. After admission patient got a CAT scan of head which is negative. She was treated with IV fosphenytoin and continued [name] Dr. Katrinka BlazingSmith, neurologist, evaluated patient, suggested continue Klonopin and start lamotrigine. He suggested patient may be discharged today and follow up neurology clinic, PCP, as outpatient. Patient has no seizure activity after admission. Patient is clinically stable, will be discharged to home today.   Discussed the patient's discharge plan with patient, case manager and nurse.   TIME SPENT: About 36 minutes.  ____________________________ Adrienne PollackQing Verlisa Vara, Jimenez qc:cms D: 05/07/2012  17:07:06 ET T: 05/08/2012 12:35:31 ET JOB#: [id number]  cc: Adrienne PollackQing Chipper Koudelka, Jimenez, <Dictator> [name]  [name] ELECTRONICALLY SIGNED 05/08/2012 16:43

## 2014-10-14 NOTE — Consult Note (Signed)
PATIENT NAME:  [name], [name] MR#:  [id number] DATE OF BIRTH:  16-Nov-1980  DATE OF CONSULTATION:  03/05/2012  REFERRING PHYSICIAN: Gaetano NetKevin Boland, MD   CONSULTING PHYSICIAN: Annie Saephan B. Zainah Steven, MD   REASON FOR CONSULTATION: To evaluate a patient after an overdose.   IDENTIFYING DATA: [name] is a 34 year old female with history of mood instability.   CHIEF COMPLAINT: "I am fine now."   HISTORY OF PRESENT ILLNESS: [name] was brought to the Emergency Room from a local [employer] where she reportedly accidentally swallowed a fentanyl patch while eating her McNuggets.  She stopped breathing and was brought to the Emergency Room. The patient adamantly denies that it was a suicide attempt; rather, she has been addicted to narcotic pain killers and has been buying them off the streets. Reportedly this was her first overture with a fentanyl patch. She did not realize how dangerous it can be when used by mouth. She does have a history of methadone use and other opioid pain killers and has been obtaining them from pain clinic at some point, but now due to misuse she has been kicked out of all pain clinics in the area. She denies symptoms of depression, anxiety or psychosis. She denies symptoms suggestive of bipolar mania. She reports good compliance with medications prescribed by Dr. Janeece RiggersSu, including Seroquel, clonazepam and trazodone. She denies alcohol or illicit substance use.   PAST PSYCHIATRIC HISTORY: The patient was diagnosed with bipolar disorder. There is a history of suicide attempt by wrist cutting. She is a patient of Dr. Janeece RiggersSu. She has been tried on several medications but feels that the current combination works best for her.   FAMILY PSYCHIATRIC HISTORY: None reported.   PAST MEDICAL HISTORY: Chronic pain.   ALLERGIES: Codeine, Cymbalta, morphine, Toradol, tramadol, Ultram, latex.   MEDICATIONS ON ADMISSION:  1. Trazodone 150 mg at bedtime. 2. Clonazepam 2 mg twice daily.   3. Seroquel XR 300 mg daily. 4. Saphris 5 mg, up to 2 pills a day for agitation.   SOCIAL HISTORY: She is on Disability. She lives with her father and two young children. Her mother is currently living in [place], reportedly suffering from cancer. She does not wish that the patient visit her, and the patient is upset about.   REVIEW OF SYSTEMS: CONSTITUTIONAL: No fevers or chills. No weight changes. EYES: No double or blurred vision. ENT: No hearing loss. RESPIRATORY: No shortness of breath or cough. CARDIOVASCULAR: No chest pain or orthopnea. GASTROINTESTINAL: No abdominal pain, nausea, vomiting, or diarrhea. GU: No incontinence or frequency. ENDOCRINE: No heat or cold intolerance. LYMPHATIC: No anemia or easy bruising. INTEGUMENTARY: No acne or rash. MUSCULOSKELETAL: No muscle or joint pain. NEUROLOGIC: No tingling or weakness. PSYCHIATRIC: See history of present illness for details.   PHYSICAL EXAMINATION:  VITAL SIGNS: Blood pressure 108/56, pulse 108, respirations 12.   GENERAL: This is a well-developed female in no acute distress. The rest of the physical examination is deferred to her primary attending.   LABORATORY DATA: Blood glucose 222, BUN 5, creatinine 1.56, sodium 141, potassium 4.0. Blood alcohol level on admission 0. LFTs within normal limits except for AST of 88, TSH 0.41, free thyroxine 1.12. Urine tox screen positive for amphetamines, opiates and methadone. CBC within normal limits except for white blood count of 16.6. Urinalysis is not suggestive of urinary tract infection. Serum acetaminophen and salicylates are low. Urine pregnancy test is negative. EKG: Sinus tachycardia, T wave abnormality. Abnormal EKG.   MENTAL STATUS EXAMINATION: The patient  is alert and oriented to person, place, time, and situation. She is pleasant, polite, and cooperative. She is cool and collected, no behavioral problems. She maintains good eye contact. She wears hospital scrubs and a yellow shirt. Her  speech is soft. Mood is fine with a full affect. Thought processing is logical and goal oriented with its own logic. Thought content: She denies suicidal or homicidal ideation. There are no delusions or paranoia. There are no auditory or visual hallucinations. Her cognition is grossly intact. Insight and judgment are questionable.   SUICIDE RISK ASSESSMENT: This is a patient with a lifelong history of mood instability and substance abuse who just overdosed "accidentally" on fentanyl patch. She is no longer suicidal or homicidal. She is able to contract for safety. She is a loving mother and daughter.   DIAGNOSES:  AXIS I: Polysubstance dependence.   AXIS II: Deferred.   AXIS III: Chronic pain.   AXIS IV: Mental illness, substance abuse, primary support.   AXIS V: Global Assessment of Functioning score is 45.   PLAN:  1. The patient no longer meets criteria for involuntary inpatient psychiatric commitment. I will terminate proceedings. Please discharge as appropriate.  2. She is to continue all her medications as prescribed by Dr. [name].  3. She will follow-up with Dr. [name] tomorrow, September 10th, at 1:00.  4. The patient denies narcotic abuse but  has been getting all her supply from the streets. She has been fired from pain clinics and needs to establish care with a new provider. No prescription for pain killers was given.  5. Her father will pick her up from the Emergency Room.   ____________________________ Ellin Goodie Jennet Maduro, MD jbp:cbb D: 03/07/2012 20:03:18 ET T: 03/08/2012 10:05:12 ET JOB#: [id number]  cc: Yehya Brendle B. Jennet Maduro, MD, <Dictator> Shari Prows MD ELECTRONICALLY SIGNED 03/09/2012 8:59
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
## 2014-10-14 NOTE — Consult Note (Signed)
Referring Physician:  Gladstone Lighter :   Primary Care Physician:  Gladstone Lighter : [facility], [address], [place], [place] [address], No Name  Reason for Consult:  Admit Date: 07-May-2012   Chief Complaint: seizures   Reason for Consult: seizure   History of Present Illness:  History of Present Illness:   34 yo RHD F with hx of seizures since age 85 presents with 4 seizures total yesterday.  Pt did return to baseline between episodes however.  Of note, she is on Clonazepam for anxiety and seizures for the past 10+ years and she ran out of medications 5 days ago.  She has had seizures before when she ran out of medication.  No she is a little sleepy but feels ok.  ROS:   General weakness    HEENT no complaints    Lungs no complaints    Cardiac no complaints    GI no complaints    GU no complaints    Musculoskeletal no complaints    Extremities no complaints    Skin no complaints    Neuro no complaints    Endocrine no complaints    Psych no complaints   Past Medical/Surgical Hx:  overdose:   Panic Attacks:   Scoliosis:   miscarraige:   Bipolar Disorder:   D&C - Dilation and Curretage:   C-Section:   Past Medical/ Surgical Hx:   Past Medical History as above    Past Surgical History as above   Home Medications: Medication Instructions Last Modified Date/Time  naproxen 500 mg tablet 1 tab(s) orally 2 times a day for pain/inflammation after L nasal injury 10-Nov-13 18:28  clonazepam 2 mg oral tablet 1 tab(s) orally 2 times a day 10-Nov-13 18:28  Seroquel  orally  10-Nov-13 18:28   Allergies:  Ultram: Resp Arrest  Morphine: Resp Arrest  Toradol: Resp Arrest  Codeine: Resp. Distress  Cymbalta: Alt Ment Status  Tramadol: Other  Latex: Unknown  Social/Family History:  Employment Status: unemployed   Lives With: other relative   Living Arrangements: house   Social History: smoker, no illicits, no  EtOH   Family History: no seizures   Vital Signs: **Vital Signs.:   11-Nov-13 08:00   Vital Signs Type Routine   Temperature Temperature (F) 97.7   Celsius 36.5   Temperature Source oral   Pulse Pulse 91   Respirations Respirations 16   Systolic BP Systolic BP 93   Diastolic BP (mmHg) Diastolic BP (mmHg) 57   Mean BP 69   Pulse Ox % Pulse Ox % 99   Pulse Ox Activity Level  At rest   Oxygen Delivery Room Air/ 21 %   Physical Exam:  General: no acute distress, appropiate weight   HEENT: normocephalic, sclera nonicteric, oropharynx clear   Neck: supple, no JVD, no bruits   Chest: CTA B, no wheezing, good movement   Cardiac: RRR, no murmurs, no edema, 2+ pulses,   Extremities: no C/C/E, FROM,   Neurologic Exam:  Mental Status: alert and oriented x 3, normal speech and language, follows complex commands, minimal confusion   Cranial Nerves: PERRLA, EOMI, nl VF, face symmetric, tongue midline, shoulder shrug equal   Motor Exam: 5/5 B normal, tone, high frequency tremor   Deep Tendon Reflexes: 2+/4 B, plantars downgoing B, no Hoffman   Sensory Exam: pinprick, temperature, and vibration intact B   Coordination: FTN and HTS WNL, nl RAM, nl gait   Lab Results: Hepatic:  10-Nov-13 15:03    Bilirubin, Total 0.4   Alkaline Phosphatase 91   SGPT (ALT) 12   SGOT (AST)  14   Total Protein, Serum 7.6   Albumin, Serum 4.0  Routine Micro:  10-Nov-13 18:51    Micro Text Report BLOOD CULTURE   COMMENT                   NO GROWTH IN 8-12 HOURS   ANTIBIOTIC                        Culture Comment NO GROWTH IN 8-12 HOURS  Result(s) reported on 07 May 2012 at 07:42AM.  General Ref:  10-Nov-13 15:03    Acetaminophen, Serum < 2 (10-30 POTENTIALLY TOXIC:  > 200 mcg/mL  > 50 mcg/mL at 12 hr after  ingestion  > 300 mcg/mL at 4 hr after  ingestion)   Salicylates, Serum  9.8 (0.0-2.8 Therapeutic 2.8-20.0 mg/dL Toxic >30.0 mg/dL)  Routine Chem:  10-Nov-13 15:03    Ethanol, S. < 3    Ethanol % (comp) < 0.003 (Result(s) reported on 06 May 2012 at 06:01PM.)   HCG Betasubunit Quant. Serum  < 1 (1-3  (International Unit)  ----------------- Non-pregnant <5 Weeks Post LMP mIU/mL  3- 4 wk 9 - 130  4- 5 wk 75 - 2,600  5- 6 wk 850 - 20,800  6- 7 wk 4,000 - 100,000  7-12 wk 11,500 - 289,000 12-16 wk 18,000 - 137,000 16-29 wk 1,400 - 53,000 29-41 wk 940 - 60,000)  11-Nov-13 04:27    Glucose, Serum 75   BUN 8   Creatinine (comp) 0.67   Sodium, Serum 141   Potassium, Serum 3.6   Chloride, Serum  111   CO2, Serum 22   Calcium (Total), Serum  8.0   Anion Gap 8   Osmolality (calc) 278   eGFR (African American) >60   eGFR (Non-African American) >60 (eGFR values <45m/min/1.73 m2 may be an indication of chronic kidney disease (CKD). Calculated eGFR is useful in patients with stable renal function. The eGFR calculation will not be reliable in acutely ill patients when serum creatinine is changing rapidly. It is not useful in  patients on dialysis. The eGFR calculation may not be applicable to patients at the low and high extremes of body sizes, pregnant women, and vegetarians.)  Urine Drugs:  [id number]   Tricyclic Antidepressant, Ur Qual (comp) POSITIVE (Result(s) reported on 06 May 2012 at 05:23PM.)   Amphetamines, Urine Qual. POSITIVE   MDMA, Urine Qual. NEGATIVE   Cocaine Metabolite, Urine Qual. NEGATIVE   Opiate, Urine qual POSITIVE   Phencyclidine, Urine Qual. NEGATIVE   Cannabinoid, Urine Qual. NEGATIVE   Barbiturates, Urine Qual. NEGATIVE   Benzodiazepine, Urine Qual. NEGATIVE (----------------- The URINE DRUG SCREEN provides only a preliminary, unconfirmed analytical test result and should not be used for non-medical  purposes.  Clinical consideration and professional judgment should be  applied to any positive drug screen result due to possible interfering substances.  A more specific alternate chemical method must be used in order to obtain a  confirmed analytical result.  Gas chromatography/mass spectrometry (GC/MS) is the preferred confirmatory method.)   Methadone, Urine Qual. NEGATIVE  Cardiac:  10-Nov-13 15:03    Troponin I < 0.02 (0.00-0.05 0.05 ng/mL or less: NEGATIVE  Repeat testing in 3-6 hrs  if clinically indicated. >0.05 ng/mL: POTENTIAL  MYOCARDIAL INJURY. Repeat  testing in 3-6 hrs if  clinically indicated. NOTE: An increase or decrease  of 30% or more on serial  testing suggests a  clinically important change)  Routine UA:  10-Nov-13 15:52    Color (UA) Yellow   Clarity (UA) Clear   Glucose (UA) Negative   Bilirubin (UA) Negative   Ketones (UA) Negative   Specific Gravity (UA) 1.028   Blood (UA) Negative   pH (UA) 5.0   Protein (UA) 30 mg/dL   Nitrite (UA) Negative   Leukocyte Esterase (UA) Negative (Result(s) reported on 06 May 2012 at 04:42PM.)   RBC (UA) 1 /HPF   WBC (UA) 1 /HPF   Bacteria (UA) NONE SEEN   Epithelial Cells (UA) <1 /HPF   Mucous (UA) PRESENT (Result(s) reported on 06 May 2012 at 04:42PM.)  Routine Sero:  10-Nov-13 15:52    Pregnancy Test, Urine NEGATIVE (The results of the qualitative urine HCG (Pregnancy Test) should be evaluated in light of other clinical information.  There are limitations to the test which, in certain clinical situations, may result in a false positive or negative result. Thehigh dose hook effect can occur in urine samples with extremely high HCG concentrations.  This effect can produce a negative result in certain situations. It is suggested that results of the qualitative HCG be confirmed by an alternate methodology, such as the quantitative serum beta HCG test.)  Routine Hem:  11-Nov-13 04:27    WBC (CBC) 6.6   RBC (CBC) 4.07   Hemoglobin (CBC)  11.9   Hematocrit (CBC) 35.5   Platelet Count (CBC) 173   MCV 87   MCH 29.3   MCHC 33.6   RDW  14.6   Neutrophil % 59.2   Lymphocyte % 31.2   Monocyte % 7.0   Eosinophil % 1.8   Basophil % 0.8    Neutrophil # 3.9   Lymphocyte # 2.1   Monocyte # 0.5   Eosinophil # 0.1   Basophil # 0.1 (Result(s) reported on 07 May 2012 at 05:10AM.)   Radiology Results: CT:    10-Nov-13 17:23, CT Head Without Contrast   CT Head Without Contrast    REASON FOR EXAM:    HA s/p sz  COMMENTS:       PROCEDURE: CT  - CT HEAD WITHOUT CONTRAST  - May 06 2012  5:23PM     RESULT: Comparison:  None    Technique: Multiple axial images from the foramen magnum to the vertex   were obtained without IV contrast.    Findings:      There is no evidence of mass effect, midline shift, or extra-axial fluid   collections.  There is no evidence of a space-occupying lesion or   intracranial hemorrhage. There is no evidence of a cortical-based area of     acute infarction.      The ventricles and sulci are appropriate for the patient's age. The basal   cisterns are patent.    Visualized portions of the orbits are unremarkable. The visualized   portions of the paranasal sinuses and mastoid air cells are unremarkable.     The osseous structures are unremarkable.    IMPRESSION:      No acute intracranial process.      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 Depression/anxiety-  not well controlled on Seroquel and Clonazepam per pt  restart Clonazepam 66m BID per pt (would double check dose with outpatient pharmacy) and can wean after a few months start Lamictal 264mPO daily x 2 weeks, then 2595mID x 2 weeks, then 69m33mD x 1 week, then 75mg19mD x 1 week, then 100mg 37mok to continue Seroquel but be aware that this can lower seizure threshold as well no need for EEG no need for MRI no driving or operating heavy machinery x 6 months can d/c today but pt will need to f/u with Neurology in 3 months, please call with questions  Electronic Signatures: Deauna Yaw,Jamison Neighbor (Signed 11-Nov-13 10:04)  Authored: REFERRING PHYSICIAN, Primary Care Physician, Consult, History of Present Illness, Review of Systems, PAST MEDICAL/SURGICAL HISTORY, HOME MEDICATIONS, ALLERGIES, Social/Family History, NURSING VITAL SIGNS, Physical Exam-, LAB RESULTS, RADIOLOGY RESULTS, Recommendations   Last Updated: 11-Nov-13 10:04 by Ekam Besson,Jamison Neighbor

## 2014-10-14 NOTE — Consult Note (Signed)
PATIENT NAME:  [name], [name] MR#:  [id number] DATE OF BIRTH:  04/02/1981  DATE OF CONSULTATION:  05/07/2012  REFERRING PHYSICIAN:  Enid Baas, MD  CONSULTING PHYSICIAN:  Jolanta B. Pucilowska, MD  REASON FOR CONSULTATION: To evaluate a patient with history of bipolar disorder.   IDENTIFYING DATA: [name] is a 34 year old female with a history of mood instability and substance abuse.   CHIEF COMPLAINT: "I don't know what happened".   HISTORY OF PRESENT ILLNESS: [name] was brought to the Emergency Room after her father noticed that the patient was having seizures. [name] does not have history of seizure disorder and has not been on medication for that. The patient admits that [name] has not seen Dr. [name], her primary psychiatrist in the community, for the past three months in spite of the fact that [name] was seen in the Emergency Room in September and had an appointment scheduled with Dr. [name] for September 10th. [name] does not remember whether or not [name] attended her appointment but probably not because [name] ran out of her medications that at that time included Seroquel XR 300 mg and Klonopin 2 mg twice daily. [name] has been off her medicines for anything about a month per patient or eight days per her father. It is quite likely that [name] had benzodiazepine withdrawal seizures. The patient reports that her mood and anxiety have worsened since [name] stopped taking medications but [name] denies feeling suicidal, homicidal, or psychotic. [name] denies current use of narcotic pain killers even though [name] has had a habit. Her last consultation in the Emergency Room was after [name] accidentally swallowed a fentanyl patch. Her urine tox screen is positive for amphetamines and opiates as well as tricyclic antidepressants probably from the Seroquel.   PAST PSYCHIATRIC HISTORY: The patient was diagnosed with bipolar disorder. There is a history of suicide attempt by wrist cutting. [name] has been in the care of Dr. [name]. [name]  likes the combination of Seroquel and clonazepam. [name] lately was tried on Saphris but did not like the way it made her feel. [name] denies having an outstanding bill in Dr. Berline Chough office and believes that [name] can return there anytime.   FAMILY PSYCHIATRIC HISTORY: None reported.   PAST MEDICAL HISTORY: Chronic pain.   ALLERGIES: Codeine, Cymbalta, morphine, Toradol, tramadol, Ultram, latex.   MEDICATIONS ON ADMISSION: None.   MEDICATIONS AT THE TIME OF CONSULTATION:  1. Tylenol as needed.  2. Lorazepam injection 2 mg as needed for seizures.  3. Zofran injection as needed.  4. Clonazepam 1 mg twice daily.  5. Lamictal 25 mg daily.  6. [name] was also loaded with Dilantin.   SOCIAL HISTORY: [name] is disabled from mental illness. [name] lives with her father and her two young children. [name] has Medicare and Medicaid.   REVIEW OF SYSTEMS: CONSTITUTIONAL: No fevers or chills. No weight changes. EYES: No double or blurred vision. ENT: No hearing loss. RESPIRATORY: No shortness of breath or cough. CARDIOVASCULAR: No chest pain or orthopnea. GASTROINTESTINAL: No abdominal pain, nausea, vomiting, or diarrhea. GU: No incontinence or frequency. ENDOCRINE: No heat or cold intolerance. LYMPHATIC: No anemia or easy bruising. INTEGUMENTARY: No acne or rash. MUSCULOSKELETAL: No muscle or joint pain. NEUROLOGIC: Status post seizure episode. PSYCHIATRIC: See history of present illness for details.   PHYSICAL EXAMINATION:   VITAL SIGNS: Blood pressure 85/50, pulse 107, respirations 18, temperature 98.1.   GENERAL: This is a slender female in no acute distress.   The rest of the physical  examination is deferred to her primary attending.   LABORATORY DATA: Chemistries are within normal limits. Blood alcohol level 0. Beta-HCG less than 1. LFTs within normal limits. Urine tox screen as above positive for amphetamines, opiates, and tricyclic antidepressants. CBC within normal limits. Urinalysis is not suggestive of urinary  tract infection. Serum acetaminophen less than 2. Serum salicylates 9.8.   MENTAL STATUS EXAMINATION: The patient is alert and oriented to person, place, time, and situation. [name] is pleasant, polite, and cooperative. [name] does not feel physically well but has no specific complaints. [name] maintains good eye contact. [name] is in bed wearing hospital gown. Her speech is of normal rhythm, rate, and volume. Mood is okay with full affect. Thought processing is logical and goal oriented. Thought content [name] denies suicidal or homicidal ideation. There are no delusions or paranoia. There are no auditory or visual hallucinations. Her cognition is grossly intact. Her insight and judgment are questionable.   SUICIDE RISK ASSESSMENT: This is a patient with a long history of mood instability and substance abuse who returns to the hospital for a seizure episode most likely precipitated by benzodiazepine withdrawal. This is in the context of medication noncompliance and substance abuse. [name] adamantly denies feeling suicidal or homicidal.   DIAGNOSES:  AXIS I: Polysubstance dependence.   AXIS II: Deferred.   AXIS III:  1. Seizures.  2. Chronic pain.   AXIS IV: Mental illness, substance abuse, primary support.   AXIS V: GAF 45.   PLAN:  1. The patient does not meet criteria for involuntary inpatient psychiatric commitment.  2. Please continue clonazepam 1 mg twice daily until [name] sees Dr. Janeece RiggersSu.  3. Please make an appointment with Dr. Berline ChoughSu's office preferably this week or before [name] runs out of Klonopin.  4. I will sign off.   ____________________________ Ellin GoodieJolanta B. Jennet MaduroPucilowska, MD jbp:drc D: 05/07/2012 12:32:48 ET T: 05/07/2012 12:58:45 ET JOB#: [id number]  cc: Jolanta B. Jennet MaduroPucilowska, MD, <Dictator> Shari ProwsJOLANTA B PUCILOWSKA MD ELECTRONICALLY SIGNED 05/08/2012 19:51
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
## 2014-11-04 NOTE — H&P (Signed)
Psychiatric Admission Assessment Adult Patient Identification:  34 year old female Date of Evaluation:  12/12/2012 Chief Complaint:  "I am here for seizures" History of Present Illness (8 essential elements):  Patient denies any mood symptoms. States she has seizures for which she takes 2mg  of Klonopin twice daily. Not seeing a neurologist for seizures, [facility]. Denies abuse of alcohol or other drugs.poor historian, focused on obtaining klonopin and oxycodone.  Per consult note" The patient was noticed to be confused and sluggish upon arrival to the Emergency Department. The patient was noticed to be mumbling at the time. She had incoherent speech and was mute and was unable to provide any history. She reported that she needed help. The patient was staring and was nonverbal. She denied having any suicidal ideation or plan. She reported that she [facility] and her next appointment was in a couple of weeks. "   Associated Signs/SymptomsSymptoms:  Denies (Hypo) Manic Symptoms:  denies Anxiety Symptoms:  denies Psychotic Symptoms:  denies PTSD Symptoms:  Denies  Psychiatric Specialty Exam: Physical Exam:  Completed in ED, reviewed, stable  ROS:   CONSTITUTIONAL: No weight loss, fever, chills, weakness or fatigue. HEENT: Eyes: No diplopia or blurred vision. ENT: No earache, sore throat or runny nose.  CARDIOVASCULAR: No pressure, squeezing, strangling, tightness, heaviness or aching about the chest, neck, axilla or epigastrium.  RESPIRATORY: No cough, shortness of breath, dyspnea or orthopnea.  GASTROINTESTINAL: No nausea, vomiting or diarrhea.  GENITOURINARY: No dysuria, frequency or urgency.  MUSCULOSKELETAL: No muscle or joint pain, stiffness.  SKIN: No change in skin, hair or nails.  NEURO: No paresthesias, fasciculations, seizures or weakness.  PSYCHIATRIC: denies any symptoms ENDOCRINE: No heat or cold intolerance,  polyuria or polydipsia.  HEMATOLOGICAL: No easy bruising or bleeding.   Vital Signs:  Blood pressure 100/68, pulse 83, temperature 98.7 degrees F oral, respiratory rate 18  General Appearance:  Neat  Eye Contact:  Fair  Speech:  Normal rate  Volume:  Normal  Mood:  Agitated  Affect:  Flat  Thought Process:  tangential  Orientation:  Alert and oriented x 3  Thought Content:  ruminative  Suicidal Thoughts: Denies  Homicidal Thoughts:  Denies  Memory:  Poor, except the past week--could not remember or communicate the past week's events  Judgment:  Impaired  Insight:  Lacking  Psychomotor Activity:  decreased  Concentration:  Poor  Recall:  Poor, events over past week  Akathisia:  None  Handed:  Right  AIMS (if indicated):    Assets:  Resilience, physical health, social support  Sleep:  0 hours   Past Psychiatric History: Diagnosis:  Bipolar disorder, polysubstance abuse  Hospitalizations:  multiple  Outpatient Care:  Dr.Su  Substance Abuse Care:  None  Self-Mutilation:  None  Suicidal Attempts:  previous attempt by cutting wrist  Violent Behaviors:  Denies   Past Medical History:  seizures  Past Medical (concussions, head injury, cardiac issues with apnea episode):  None  Allergies:  None  PTA Medications:  klonopin  Previous Psychotropic Medications:  lamictal  Medication/Dose:  None           Substance Abuse History in the last 12 months:  denies  Consequences of Substance Abuse:  poor health  Social History:  livesw ith father and 2 young children  Additional Social History: Marital Status:  single Education:  high school History of Abuse (Emotional/Physical/Sexual):  Denies Hotel managerMilitary History:  None  Family History:  Denies  No results found for this or any previous visit (from the past 72 hours).Evaluations Assessment:I:  Bipolar disorderII:  DeferredIII:  seizures AXIS IV: social issues AXIS V:  35  Treatment Plan/Recommendations:  Review of  chart, vital signs, medications, and notes. 1. Admit for crisis management and stabilization; estimated length of stay 5-7 days.Individual and group therapy encouraged.Medication management for detox from klonopin.Coping skills to help with mood lability.Continue crisis stabilization and management.Address health issues:  Monitoring vital signs, elevated due to agitation upon arriving     to the unit; will continue to monitor closely.Treatment plan in progress to prevent relapse of depression and anxiety.Psychosocial education regarding relapse prevention and self-care.Health care follow-up for any health concerns that may arise.Call for consult with hospitalist for addtional specialty patient services as needed.  Current Medications: Librium 25mg  po bid, Librium 25mg  po q 6hrs prn for anxiety, Tegretol 100mg  po daily for seizures  Observation Level/Precautions:  Every 15 minute checks  Laboratory:  Completed and reviewed, stable  Psychotherapy:  Individual and group therapy  Medications:  Management  Consultations:  None  Discharge Concerns:  substance abuse treatment  Estimated LOS:  5-7 days  Other:  certify that inpatient services furnished can reasonably be expected to improve the patient's condition.  Hima Thy Gullikson,MD   Electronic Signatures: Patrick Northavi, Fedrick Cefalu (MD)  (Signed on 18-Jun-14 13:34)  Authored  Last Updated: 18-Jun-14 13:34 by Patrick Northavi, Omaya Nieland (MD)
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
## 2014-11-18 ENCOUNTER — Ambulatory Visit
Admission: EM | Admit: 2014-11-18 | Discharge: 2014-11-18 | Payer: Medicare Other | Attending: Family Medicine | Admitting: Family Medicine

## 2014-11-18 ENCOUNTER — Encounter: Payer: Self-pay | Admitting: Emergency Medicine

## 2014-11-18 ENCOUNTER — Other Ambulatory Visit: Payer: Self-pay

## 2014-11-18 DIAGNOSIS — R0602 Shortness of breath: Secondary | ICD-10-CM | POA: Diagnosis not present

## 2014-11-18 DIAGNOSIS — F17210 Nicotine dependence, cigarettes, uncomplicated: Secondary | ICD-10-CM | POA: Diagnosis not present

## 2014-11-18 DIAGNOSIS — R9431 Abnormal electrocardiogram [ECG] [EKG]: Secondary | ICD-10-CM | POA: Insufficient documentation

## 2014-11-18 DIAGNOSIS — R0789 Other chest pain: Secondary | ICD-10-CM

## 2014-11-18 DIAGNOSIS — L249 Irritant contact dermatitis, unspecified cause: Secondary | ICD-10-CM | POA: Insufficient documentation

## 2014-11-18 DIAGNOSIS — R21 Rash and other nonspecific skin eruption: Secondary | ICD-10-CM | POA: Diagnosis present

## 2014-11-18 DIAGNOSIS — R079 Chest pain, unspecified: Secondary | ICD-10-CM | POA: Insufficient documentation

## 2014-11-18 HISTORY — DX: Unspecified convulsions: R56.9

## 2014-11-18 HISTORY — DX: Other intervertebral disc degeneration, lumbar region: M51.36

## 2014-11-18 HISTORY — DX: Other intervertebral disc degeneration, lumbar region without mention of lumbar back pain or lower extremity pain: M51.369

## 2014-11-18 HISTORY — DX: Pain in right shoulder: M25.511

## 2014-11-18 MED ORDER — SODIUM CHLORIDE 0.9 % IV BOLUS (SEPSIS)
1000.0000 mL | Freq: Once | INTRAVENOUS | Status: AC
  Administered 2014-11-18: 1000 mL via INTRAVENOUS

## 2014-11-18 NOTE — ED Notes (Signed)
Patient being sent to [facility] ED for further evaluation

## 2014-11-18 NOTE — ED Provider Notes (Signed)
CSN: [id number]     Arrival date & time 11/18/14  1438 History   None    Chief Complaint  Patient presents with  . Rash   (Consider location/radiation/quality/duration/timing/severity/associated sxs/prior Treatment) HPI        34 year old female presents complaining of a poison ivy rash on her arm and face. She reports that 2 days ago she was looking for Which was an extremely and went in the woods, she thinks she was exposed to poison ivy. This started as one spot on her right arm and has since spread up and down her arm as well as onto the right side of her face. It is extremely itchy. Additionally she has been feeling lightheaded, and she has chest pain and shortness of breath. No chest tightness or wheezing. No NVD. She says she has a history of anaphylaxis from poison ivy.   Past Medical History  Diagnosis Date  . Shoulder pain, right   . Degeneration of lumbar intervertebral disc   . Seizures    Past Surgical History  Procedure Laterality Date  . Cesarean section      X2   No family history on file. History  Substance Use Topics  . Smoking status: Current Every Day Smoker    Types: Cigarettes  . Smokeless tobacco: Never Used  . Alcohol Use: No   OB History    No data available     Review of Systems  Constitutional: Positive for fatigue. Negative for fever and chills.  Respiratory: Positive for shortness of breath. Negative for chest tightness and wheezing.   Cardiovascular: Positive for chest pain. Negative for palpitations and leg swelling.  Gastrointestinal: Negative for nausea, vomiting, abdominal pain and diarrhea.  Skin: Positive for rash.  All other systems reviewed and are negative.   Allergies  Prednisone; Poison ivy extract; Poison oak extract; and Sumac  Home Medications   Prior to Admission medications   Medication Sig Start Date End Date Taking? Authorizing Provider  Buprenorphine 7.5 MCG/HR PTWK Place onto the skin.   Yes Historical Provider, MD   clonazePAM (KLONOPIN) 2 MG tablet Take 2 mg by mouth 2 (two) times daily.   Yes Historical Provider, MD  oxycodone (OXY-IR) 5 MG capsule Take 5 mg by mouth every 4 (four) hours as needed.   Yes Historical Provider, MD  QUEtiapine (SEROQUEL) 300 MG tablet Take 300 mg by mouth at bedtime.   Yes Historical Provider, MD   BP 119/67 mmHg  Pulse 122  Temp(Src) 99.3 F (37.4 C) (Oral)  Resp 16  Ht 5\' 2"  (1.575 m)  Wt 134 lb (60.782 kg)  BMI 24.50 kg/m2  SpO2 98%  LMP 11/01/2014 Physical Exam  Constitutional: She is oriented to person, place, and time. Vital signs are normal. She appears well-developed and well-nourished. She appears ill. No distress.  Appears fatigued, slurring speech  HENT:  Head: Normocephalic and atraumatic.  Neck: Normal range of motion. Neck supple.  Cardiovascular: Regular rhythm, normal heart sounds and normal pulses.  Tachycardia present.   Pulmonary/Chest: Effort normal and breath sounds normal. No respiratory distress. She has no wheezes. She has no rales.  Neurological: She is alert and oriented to person, place, and time. She has normal strength. Coordination normal.  Skin: Skin is warm and dry. Rash (erythematous maculopapular rash on the right forearm. There is a central linear area of crusting) noted. She is not diaphoretic.  Dried calamine lotion on the right arm and the right side of the face  Psychiatric:  She has a normal mood and affect. Judgment normal.  Nursing note and vitals reviewed.   ED Course  ED EKG  Date/Time: 11/18/2014 3:46 PM Performed by: Graylon Good Authorized by: Autumn Messing H Comparison: not compared with previous ECG  Previous ECG: no previous ECG available Rhythm: sinus tachycardia Rate: tachycardic QRS axis: right Conduction: conduction normal ST Segments: ST segments normal T depression: III, aVF, V3, V2 and V4 T flattening: V5 and V6 Other: no other findings Clinical impression: abnormal ECG Comments: Sinus  tachycardia with right axis deviation, anterior lateral and inferior T-wave inversions.  EKG independently reviewed by me as above, agree with automatic read   (including critical care time) Labs Review Labs Reviewed - No data to display  Imaging Review No results found.   MDM   1. Chest pain, atypical   2. SOB (shortness of breath)   3. Abnormal EKG   4. Irritant contact dermatitis    chest pain and shortness of breath with an abnormal, possibly ischemic EKG. Transferred to emergency department via EMS for further evaluation.      Graylon Good, PA-C 11/18/14 1553

## 2014-11-18 NOTE — ED Notes (Signed)
Rash started on right arm and right side of face 4 days ago. Rash is itchy and burning. Reports her cat got lost a few days ago and she was looking in the bushes for her and was exposed to poison ivy.

## 2015-03-09 ENCOUNTER — Emergency Department
Admission: EM | Admit: 2015-03-09 | Discharge: 2015-03-09 | Disposition: A | Discharge status: Home / Self Care | Payer: Medicare Other | Attending: Emergency Medicine | Admitting: Emergency Medicine

## 2015-03-09 DIAGNOSIS — R569 Unspecified convulsions: Secondary | ICD-10-CM

## 2015-03-09 DIAGNOSIS — Z720 Tobacco use: Secondary | ICD-10-CM | POA: Insufficient documentation

## 2015-03-09 DIAGNOSIS — Z79899 Other long term (current) drug therapy: Secondary | ICD-10-CM | POA: Diagnosis not present

## 2015-03-09 DIAGNOSIS — Z3202 Encounter for pregnancy test, result negative: Secondary | ICD-10-CM | POA: Insufficient documentation

## 2015-03-09 DIAGNOSIS — G40909 Epilepsy, unspecified, not intractable, without status epilepticus: Secondary | ICD-10-CM | POA: Diagnosis not present

## 2015-03-09 DIAGNOSIS — Z79891 Long term (current) use of opiate analgesic: Secondary | ICD-10-CM | POA: Diagnosis not present

## 2015-03-09 DIAGNOSIS — R51 Headache: Secondary | ICD-10-CM | POA: Insufficient documentation

## 2015-03-09 LAB — BASIC METABOLIC PANEL
ANION GAP: 9 (ref 5–15)
BUN: 8 mg/dL (ref 6–20)
CHLORIDE: 110 mmol/L (ref 101–111)
CO2: 23 mmol/L (ref 22–32)
Calcium: 8.6 mg/dL — ABNORMAL LOW (ref 8.9–10.3)
Creatinine, Ser: 0.63 mg/dL (ref 0.44–1.00)
GFR calc non Af Amer: >60 mL/min (ref >60)
Glucose, Bld: 128 mg/dL — ABNORMAL HIGH (ref 65–99)
POTASSIUM: 3.3 mmol/L — AB (ref 3.5–5.1)
Sodium: 142 mmol/L (ref 135–145)

## 2015-03-09 LAB — CBC WITH DIFFERENTIAL/PLATELET
Basophils Absolute: 0 10*3/uL (ref 0–0.1)
Basophils Relative: 1 %
Eosinophils Absolute: 0.1 10*3/uL (ref 0–0.7)
Eosinophils Relative: 2 %
HCT: 37.4 % (ref 35.0–47.0)
HEMOGLOBIN: 12.5 g/dL (ref 12.0–16.0)
LYMPHS ABS: 1.3 10*3/uL (ref 1.0–3.6)
Lymphocytes Relative: 26 %
MCH: 30 pg (ref 26.0–34.0)
MCHC: 33.6 g/dL (ref 32.0–36.0)
MCV: 89.6 fL (ref 80.0–100.0)
Monocytes Absolute: 0.3 10*3/uL (ref 0.2–0.9)
Monocytes Relative: 6 %
NEUTROS PCT: 65 %
Neutro Abs: 3.2 10*3/uL (ref 1.4–6.5)
PLATELETS: 182 10*3/uL (ref 150–440)
RBC: 4.17 MIL/uL (ref 3.80–5.20)
RDW: 13.6 % (ref 11.5–14.5)
WBC: 4.9 10*3/uL (ref 3.6–11.0)

## 2015-03-09 LAB — URINALYSIS COMPLETE WITH MICROSCOPIC (ARMC ONLY)
BILIRUBIN URINE: NEGATIVE
Glucose, UA: NEGATIVE mg/dL
Hgb urine dipstick: NEGATIVE
KETONES UR: NEGATIVE mg/dL
LEUKOCYTES UA: NEGATIVE
NITRITE: NEGATIVE
PH: 5 (ref 5.0–8.0)
Protein, ur: 30 mg/dL — AB
SPECIFIC GRAVITY, URINE: 1.023 (ref 1.005–1.030)

## 2015-03-09 LAB — URINE DRUG SCREEN, QUALITATIVE (ARMC ONLY)
AMPHETAMINES, UR SCREEN: NOT DETECTED
Barbiturates, Ur Screen: NOT DETECTED
Benzodiazepine, Ur Scrn: POSITIVE — AB
CANNABINOID 50 NG, UR ~~LOC~~: NOT DETECTED
Cocaine Metabolite,Ur ~~LOC~~: NOT DETECTED
MDMA (Ecstasy)Ur Screen: NOT DETECTED
Methadone Scn, Ur: NOT DETECTED
OPIATE, UR SCREEN: NOT DETECTED
Phencyclidine (PCP) Ur S: NOT DETECTED
Tricyclic, Ur Screen: POSITIVE — AB

## 2015-03-09 MED ORDER — LEVETIRACETAM 500 MG/5ML IV SOLN
1000.0000 mg | Freq: Once | INTRAVENOUS | Status: AC
  Administered 2015-03-09: 1000 mg via INTRAVENOUS
  Filled 2015-03-09: qty 10

## 2015-03-09 NOTE — ED Notes (Signed)
Patient resting in stretcher. Respirations even and unlabored. No obvious distress. Cardiac monitor in place. No needs/concerns verbalized at this time. Call bell within reach. Encouraged to call with needs. Will continue to monitor. 

## 2015-03-09 NOTE — ED Notes (Signed)
MD at bedside. 

## 2015-03-09 NOTE — ED Provider Notes (Signed)
[facility] Emergency Department Provider Note   ____________________________________________  Time seen: 1915  I have reviewed the triage vital signs and the nursing notes.   HISTORY  Chief Complaint Seizures   History limited by: Not Limited   HPI [name] is a 34 y.o. female with a history of epilepsy who presents to the emergency department today after 3 seizures. Her boyfriend states that the seizures all to place within 5 minutes. The patient does have a history of seizures and is on Keppra. She states however she is not consistent in taking it. She has missed a couple of doses in the past couple of days. Additionally the patient states that she did not sleep last night. Furthermore the patient double her dose of Seroquel this morning after not sleeping overnight. The patient is currently complaining of generalized body cramps and headache. She states this is normal for her after her seizures.   Past Medical History  Diagnosis Date  . Shoulder pain, right   . Degeneration of lumbar intervertebral disc   . Seizures     There are no active problems to display for this patient.   Past Surgical History  Procedure Laterality Date  . Cesarean section      X2    Current Outpatient Rx  Name  Route  Sig  Dispense  Refill  . Buprenorphine 7.5 MCG/HR PTWK   Transdermal   Place onto the skin.         . clonazePAM (KLONOPIN) 2 MG tablet   Oral   Take 2 mg by mouth 2 (two) times daily.         Marland Kitchen oxycodone (OXY-IR) 5 MG capsule   Oral   Take 5 mg by mouth every 4 (four) hours as needed.         Marland Kitchen QUEtiapine (SEROQUEL) 300 MG tablet   Oral   Take 300 mg by mouth at bedtime.           Allergies Prednisone; Poison ivy extract; Poison oak extract; and Sumac  No family history on file.  Social History Social History  Substance Use Topics  . Smoking status: Current Every Day Smoker    Types: Cigarettes  . Smokeless tobacco:  Never Used  . Alcohol Use: No    Review of Systems  Constitutional: Negative for fever. Cardiovascular: Negative for chest pain. Respiratory: Negative for shortness of breath. Gastrointestinal: Negative for abdominal pain, vomiting and diarrhea. Genitourinary: Negative for dysuria. Musculoskeletal: Positive for generalized cramping Skin: Negative for rash. Neurological: Positive for headache  10-point ROS otherwise negative.  ____________________________________________   PHYSICAL EXAM:  VITAL SIGNS: ED Triage Vitals  Enc Vitals Group     BP 03/09/15 1852 128/92 mmHg     Pulse Rate 03/09/15 1852 109     Resp 03/09/15 1852 13     Temp 03/09/15 1852 98.6 F (37 C)     Temp Source 03/09/15 1852 Oral     SpO2 03/09/15 1848 97 %     Weight --      Height 03/09/15 1852 5\' 1"  (1.549 m)     Head Cir --      Peak Flow --      Pain Score 03/09/15 1853 0   Constitutional: Alert and oriented. Well appearing and in no distress. Eyes: Conjunctivae are normal. PERRL. Normal extraocular movements. ENT   Head: Normocephalic and atraumatic.   Nose: No congestion/rhinnorhea.   Mouth/Throat: Mucous membranes are moist.  Neck: No stridor. Hematological/Lymphatic/Immunilogical: No cervical lymphadenopathy. Cardiovascular: Normal rate, regular rhythm.  No murmurs, rubs, or gallops. Respiratory: Normal respiratory effort without tachypnea nor retractions. Breath sounds are clear and equal bilaterally. No wheezes/rales/rhonchi. Gastrointestinal: Soft and nontender. No distention.  Genitourinary: Deferred Musculoskeletal: Normal range of motion in all extremities. No joint effusions.  No lower extremity tenderness nor edema. Neurologic:  Normal speech and language. No gross focal neurologic deficits are appreciated. Speech is normal.  Skin:  Skin is warm, dry and intact. No rash noted. Psychiatric: Mood and affect are normal. Speech and behavior are normal. Patient exhibits  appropriate insight and judgment.  ____________________________________________    LABS (pertinent positives/negatives)  Labs Reviewed  BASIC METABOLIC PANEL - Abnormal; Notable for the following:    Potassium 3.3 (*)    Glucose, Bld 128 (*)    Calcium 8.6 (*)    All other components within normal limits  URINALYSIS COMPLETEWITH MICROSCOPIC ([facility] ONLY) - Abnormal; Notable for the following:    Color, Urine YELLOW (*)    APPearance HAZY (*)    Protein, ur 30 (*)    Bacteria, UA RARE (*)    Squamous Epithelial / LPF 6-30 (*)    All other components within normal limits  URINE DRUG SCREEN, QUALITATIVE ([facility] ONLY) - Abnormal; Notable for the following:    Tricyclic, Ur Screen POSITIVE (*)    Benzodiazepine, Ur Scrn POSITIVE (*)    All other components within normal limits  CBC WITH DIFFERENTIAL/PLATELET  CBG MONITORING, ED    ____________________________________________   EKG  None  ____________________________________________    RADIOLOGY  None  ____________________________________________   PROCEDURES  Procedure(s) performed: None  Critical Care performed: No  ____________________________________________   INITIAL IMPRESSION / ASSESSMENT AND PLAN / ED COURSE  Pertinent labs & imaging results that were available during my care of the patient were reviewed by me and considered in my medical decision making (see chart for details).  Patient presents to the emergency department today after seizures. Patient now back to baseline. It does sound the patient is a not 100% compliant with her medications. Additionally patient did have sleep deprivation last night. I did write patient to get a gram of Keppra. I  I discussed with patient importance of not driving for 6 months. Patient verbalized this back to me. I will discharge patient to follow-up with her primary neurologist. I stressed importance of taking her Keppra as  prescribed.  ____________________________________________   FINAL CLINICAL IMPRESSION(S) / ED DIAGNOSES  Final diagnoses:  Seizure     Phineas Semen, MD 03/09/15 2130

## 2015-03-09 NOTE — ED Notes (Signed)
Pt brought to ED from home via EMS w/ c/o seizure.  Per EMS, pt had 2 seizures at home witnessed by boyfriend and one witnessed by EMS.  Per EMS, seizure was grand mal and lasted approx 1 min.  Per EMS, pt has hx and has rx for Kepra, but does not always take it.

## 2015-03-09 NOTE — ED Notes (Signed)
POCT PREG NEGATIVE. °

## 2015-03-09 NOTE — Discharge Instructions (Signed)
As we discussed it is very important that you do not drive for 6 months and only after that if you are cleared by your neurologist. Please seek medical attention for any high fevers, chest pain, shortness of breath, change in behavior, persistent vomiting, bloody stool or any other new or concerning symptoms.   Epilepsy Epilepsy is a disorder in which a person has repeated seizures over time. A seizure is a release of abnormal electrical activity in the brain. Seizures can cause a change in attention, behavior, or the ability to remain awake and alert (altered mental status). Seizures often involve uncontrollable shaking (convulsions).  Most people with epilepsy lead normal lives. However, people with epilepsy are at an increased risk of falls, accidents, and injuries. Therefore, it is important to begin treatment right away. CAUSES  Epilepsy has many possible causes. Anything that disturbs the normal pattern of brain cell activity can lead to seizures. This may include:   Head injury.  Birth trauma.  High fever as a child.  Stroke.  Bleeding into or around the brain.  Certain drugs.  Prolonged low oxygen, such as what occurs after CPR efforts.  Abnormal brain development.  Certain illnesses, such as meningitis, encephalitis (brain infection), malaria, and other infections.  An imbalance of nerve signaling chemicals (neurotransmitters).  SIGNS AND SYMPTOMS  The symptoms of a seizure can vary greatly from one person to another. Right before a seizure, you may have a warning (aura) that a seizure is about to occur. An aura may include the following symptoms:  Fear or anxiety.  Nausea.  Feeling like the room is spinning (vertigo).  Vision changes, such as seeing flashing lights or spots. Common symptoms during a seizure include:  Abnormal sensations, such as an abnormal smell or a bitter taste in the mouth.   Sudden, general body stiffness.   Convulsions that involve  rhythmic jerking of the face, arm, or leg on one or both sides.   Sudden change in consciousness.   Appearing to be awake but not responding.   Appearing to be asleep but cannot be awakened.   Grimacing, chewing, lip smacking, drooling, tongue biting, or loss of bowel or bladder control. After a seizure, you may feel sleepy for a while. DIAGNOSIS  Your health care provider will ask about your symptoms and take a medical history. Descriptions from any witnesses to your seizures will be very helpful in the diagnosis. A physical exam, including a detailed neurological exam, is necessary. Various tests may be done, such as:   An electroencephalogram (EEG). This is a painless test of your brain waves. In this test, a diagram is created of your brain waves. These diagrams can be interpreted by a specialist.  An MRI of the brain.   A CT scan of the brain.   A spinal tap (lumbar puncture, LP).  Blood tests to check for signs of infection or abnormal blood chemistry. TREATMENT  There is no cure for epilepsy, but it is generally treatable. Once epilepsy is diagnosed, it is important to begin treatment as soon as possible. For most people with epilepsy, seizures can be controlled with medicines. The following may also be used:  A pacemaker for the brain (vagus nerve stimulator) can be used for people with seizures that are not well controlled by medicine.  Surgery on the brain. For some people, epilepsy eventually goes away. HOME CARE INSTRUCTIONS   Follow your health care provider's recommendations on driving and safety in normal activities.  Get enough  rest. Lack of sleep can cause seizures.  Only take over-the-counter or prescription medicines as directed by your health care provider. Take any prescribed medicine exactly as directed.  Avoid any known triggers of your seizures.  Keep a seizure diary. Record what you recall about any seizure, especially any possible trigger.    Make sure the people you live and work with know that you are prone to seizures. They should receive instructions on how to help you. In general, a witness to a seizure should:   Cushion your head and body.   Turn you on your side.   Avoid unnecessarily restraining you.   Not place anything inside your mouth.   Call for emergency medical help if there is any question about what has occurred.   Follow up with your health care provider as directed. You may need regular blood tests to monitor the levels of your medicine.  SEEK MEDICAL CARE IF:   You develop signs of infection or other illness. This might increase the risk of a seizure.   You seem to be having more frequent seizures.   Your seizure pattern is changing.  SEEK IMMEDIATE MEDICAL CARE IF:   You have a seizure that does not stop after a few moments.   You have a seizure that causes any difficulty in breathing.   You have a seizure that results in a very severe headache.   You have a seizure that leaves you with the inability to speak or use a part of your body.  Document Released: 06/13/2005 Document Revised: 04/03/2013 Document Reviewed: 01/23/2013 [facility] Patient Information 2015 [place], [place]. This information is not intended to replace advice given to you by your health care provider. Make sure you discuss any questions you have with your health care provider.

## 2015-03-09 NOTE — ED Notes (Signed)
Pts family at bedside.  Pts boyfriend sts that pt took 2 seraquil this AM instead of 1

## 2015-03-10 LAB — POCT PREGNANCY, URINE: Preg Test, Ur: NEGATIVE

## 2015-10-22 ENCOUNTER — Emergency Department
Admission: EM | Admit: 2015-10-22 | Discharge: 2015-10-22 | Disposition: A | Discharge status: Home / Self Care | Payer: Medicare Other | Attending: Emergency Medicine | Admitting: Emergency Medicine

## 2015-10-22 ENCOUNTER — Encounter: Payer: Self-pay | Admitting: Emergency Medicine

## 2015-10-22 DIAGNOSIS — F29 Unspecified psychosis not due to a substance or known physiological condition: Secondary | ICD-10-CM | POA: Diagnosis not present

## 2015-10-22 DIAGNOSIS — F1110 Opioid abuse, uncomplicated: Secondary | ICD-10-CM

## 2015-10-22 DIAGNOSIS — Z8669 Personal history of other diseases of the nervous system and sense organs: Secondary | ICD-10-CM | POA: Diagnosis not present

## 2015-10-22 DIAGNOSIS — F17210 Nicotine dependence, cigarettes, uncomplicated: Secondary | ICD-10-CM | POA: Diagnosis not present

## 2015-10-22 DIAGNOSIS — R451 Restlessness and agitation: Secondary | ICD-10-CM | POA: Diagnosis present

## 2015-10-22 DIAGNOSIS — F259 Schizoaffective disorder, unspecified: Secondary | ICD-10-CM

## 2015-10-22 DIAGNOSIS — G40909 Epilepsy, unspecified, not intractable, without status epilepticus: Secondary | ICD-10-CM | POA: Insufficient documentation

## 2015-10-22 DIAGNOSIS — F23 Brief psychotic disorder: Secondary | ICD-10-CM

## 2015-10-22 DIAGNOSIS — M5136 Other intervertebral disc degeneration, lumbar region: Secondary | ICD-10-CM | POA: Insufficient documentation

## 2015-10-22 LAB — SALICYLATE LEVEL

## 2015-10-22 LAB — CBC
HCT: 39.4 % (ref 35.0–47.0)
Hemoglobin: 13.6 g/dL (ref 12.0–16.0)
MCH: 29.9 pg (ref 26.0–34.0)
MCHC: 34.4 g/dL (ref 32.0–36.0)
MCV: 87 fL (ref 80.0–100.0)
PLATELETS: 231 10*3/uL (ref 150–440)
RBC: 4.53 MIL/uL (ref 3.80–5.20)
RDW: 13.4 % (ref 11.5–14.5)
WBC: 4.5 10*3/uL (ref 3.6–11.0)

## 2015-10-22 LAB — COMPREHENSIVE METABOLIC PANEL
ALT: 8 U/L — ABNORMAL LOW (ref 14–54)
ANION GAP: 10 (ref 5–15)
AST: 14 U/L — AB (ref 15–41)
Albumin: 4.4 g/dL (ref 3.5–5.0)
Alkaline Phosphatase: 64 U/L (ref 38–126)
BUN: 9 mg/dL (ref 6–20)
CHLORIDE: 111 mmol/L (ref 101–111)
CO2: 20 mmol/L — ABNORMAL LOW (ref 22–32)
Calcium: 9.1 mg/dL (ref 8.9–10.3)
Creatinine, Ser: 0.69 mg/dL (ref 0.44–1.00)
GFR calc Af Amer: >60 mL/min (ref >60)
Glucose, Bld: 90 mg/dL (ref 65–99)
POTASSIUM: 3.4 mmol/L — AB (ref 3.5–5.1)
Sodium: 141 mmol/L (ref 135–145)
Total Bilirubin: 0.7 mg/dL (ref 0.3–1.2)
Total Protein: 7.4 g/dL (ref 6.5–8.1)

## 2015-10-22 LAB — URINE DRUG SCREEN, QUALITATIVE (ARMC ONLY)
AMPHETAMINES, UR SCREEN: NOT DETECTED
BENZODIAZEPINE, UR SCRN: POSITIVE — AB
Barbiturates, Ur Screen: NOT DETECTED
Cannabinoid 50 Ng, Ur ~~LOC~~: NOT DETECTED
Cocaine Metabolite,Ur ~~LOC~~: NOT DETECTED
MDMA (ECSTASY) UR SCREEN: NOT DETECTED
METHADONE SCREEN, URINE: NOT DETECTED
Opiate, Ur Screen: NOT DETECTED
Phencyclidine (PCP) Ur S: NOT DETECTED
TRICYCLIC, UR SCREEN: POSITIVE — AB

## 2015-10-22 LAB — ACETAMINOPHEN LEVEL

## 2015-10-22 LAB — POCT PREGNANCY, URINE: PREG TEST UR: NEGATIVE

## 2015-10-22 LAB — ETHANOL

## 2015-10-22 MED ORDER — LORAZEPAM 2 MG/ML IJ SOLN
2.0000 mg | Freq: Once | INTRAMUSCULAR | Status: AC
  Administered 2015-10-22: 2 mg via INTRAMUSCULAR

## 2015-10-22 MED ORDER — DIPHENHYDRAMINE HCL 50 MG/ML IJ SOLN
INTRAMUSCULAR | Status: AC
  Administered 2015-10-22: 50 mg via INTRAMUSCULAR
  Filled 2015-10-22: qty 1

## 2015-10-22 MED ORDER — POTASSIUM CHLORIDE CRYS ER 20 MEQ PO TBCR
EXTENDED_RELEASE_TABLET | ORAL | Status: AC
  Filled 2015-10-22: qty 2

## 2015-10-22 MED ORDER — LORAZEPAM 2 MG/ML IJ SOLN
INTRAMUSCULAR | Status: AC
  Administered 2015-10-22: 2 mg via INTRAMUSCULAR
  Filled 2015-10-22: qty 1

## 2015-10-22 MED ORDER — HALOPERIDOL LACTATE 5 MG/ML IJ SOLN
INTRAMUSCULAR | Status: AC
  Administered 2015-10-22: 5 mg via INTRAMUSCULAR
  Filled 2015-10-22: qty 1

## 2015-10-22 MED ORDER — CLONAZEPAM 2 MG PO TABS: 2.0000 mg | ORAL_TABLET | Freq: Two times a day (BID) | ORAL | Status: DC

## 2015-10-22 MED ORDER — HALOPERIDOL LACTATE 5 MG/ML IJ SOLN
5.0000 mg | Freq: Once | INTRAMUSCULAR | Status: AC
  Administered 2015-10-22: 5 mg via INTRAMUSCULAR

## 2015-10-22 MED ORDER — DIPHENHYDRAMINE HCL 50 MG/ML IJ SOLN
50.0000 mg | Freq: Once | INTRAMUSCULAR | Status: AC
  Administered 2015-10-22: 50 mg via INTRAMUSCULAR

## 2015-10-22 MED ORDER — QUETIAPINE FUMARATE ER 300 MG PO TB24: 300.0000 mg | ORAL_TABLET | Freq: Every day | ORAL | Status: DC

## 2015-10-22 NOTE — ED Notes (Addendum)

## 2015-10-22 NOTE — ED Notes (Signed)
Pt ambulated to bathroom. Pt very timid. Pt used emergency light in bathroom to have someone open the door and she returned to her room. Pt was just standing in bathroom when tech opened the door.

## 2015-10-22 NOTE — ED Notes (Signed)
BEHAVIORAL HEALTH ROUNDING Patient sleeping: No. Patient alert and oriented: yes Behavior appropriate: Yes.  ; If no, describe:  Nutrition and fluids offered: yes Toileting and hygiene offered: Yes  Sitter present: q15 minute observations and security  monitoring Law enforcement present: Yes  ODS  

## 2015-10-22 NOTE — Discharge Instructions (Signed)
Psychosis °Psychosis refers to a severe loss of contact with reality. During a psychotic episode, a person is not able to think clearly, and his or her emotions and responses do not match up with what is actually happening. Someone may have false beliefs about what is happening or who they are (delusions). Someone may see, hear, taste, smell, or feel things that are not present (hallucinations).  °Psychosis usually occurs with very serious mental health (psychiatric) conditions such as schizophrenia, bipolar disorder, or major depression. It can sometimes also be the result of drug use or certain medical conditions. °SYMPTOMS °Symptoms of a psychotic episode include: °· Delusions, such as: °¨ Feeling excessive fear or suspicion (paranoia). °¨ Believing something that is odd, unrealistic, or false, such as having a false belief about being someone else. °· Hallucinations. °· Disorganized thinking, such as thoughts that jump from one to another that do not make sense to others. °· Disorganized speech, such as saying things that do not make sense to others. °· Inappropriate behavior, such as talking to oneself or intruding on unfamiliar people. °DIAGNOSIS °A diagnosis of psychosis is made through an assessment by a health care provider, who will ask questions about thoughts, feelings, behavior, drug use, and medical conditions. The health care provider may also do one or more of the following: °· Physical exam. °· Blood tests. °· Brain imaging, such as a CT scan or MRI. °· Brain wave study (EEG). °The health care provider may make a referral for further evaluation by a mental health professional. °TREATMENT  °Treatment depends on the cause of the psychosis. Treatment may include one or more of the following: °· Monitoring and supportive care in the emergency room or hospital.   °· Taking medicines (antipsychotic medicine) to reduce symptoms and to balance chemicals in the brain. °· Treating an underlying medical  condition. °· Stopping or reducing drugs that are causing psychosis. °· Therapy and other supportive programs outside of the hospital. °HOME CARE INSTRUCTIONS °· Over-the-counter and prescription medicines should be taken only as told by the health care provider. °· The health care provider should be consulted before over-the-counter medicines, herbs, or supplements are used. °· All follow-up visits should be kept as told by the health care provider. This is important. °· A healthy lifestyle should be maintained. This includes: °¨ Eating a healthy diet. °¨ Getting enough sleep. °¨ Exercising regularly. °¨ Avoiding alcohol and recreational drugs as told by the health care provider. °SEEK MEDICAL CARE IF: °· Medicines do not seem to be helping. °· The person hears voices telling him or her to do things. °· The person continues to see, smell, or feel things that are not there. °· The person feels extremely fearful and suspicious that someone or something will harm him or her. °· The person feels unable to leave his or her house. °· The person has trouble taking care of himself or herself. °· The person experiences side effects of medicines, such as: °¨ Changes in sleep patterns. °¨ Dizziness. °¨ Weight gain. °¨ Restlessness. °¨ Movement changes. °¨ Muscle spasms. °¨ Tremors. °SEEK IMMEDIATE MEDICAL CARE IF: °· Serious thoughts occur about self-harm or about hurting others. °· There are serious side effects of medicine, such as: °¨ Swelling of the face, lips, tongue, or throat. °¨ Fever, confusion, muscle spasms, or seizures. °  °This information is not intended to replace advice given to you by your health care provider. Make sure you discuss any questions you have with your health care provider. °  °  Document Released: 12/01/2009 Document Revised: 10/28/2014 Document Reviewed: 06/17/2014 °Elsevier Interactive Patient Education ©2016 [employer] ° °

## 2015-10-22 NOTE — ED Notes (Signed)
Charge RN changed out patient with ED Daivd Councilech Julie.
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
## 2015-10-22 NOTE — ED Notes (Signed)
Verbal orders received by MD [name].
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
## 2015-10-22 NOTE — ED Notes (Signed)
Pt given breakfast tray

## 2015-10-22 NOTE — ED Notes (Signed)
Patient verbally aggressive with staff and hospital security. Patient refusing ALL medical treatment. MD [name] called immediately to treatment room by Charge RN. Awaiting further instructions.

## 2015-10-22 NOTE — ED Notes (Signed)
Pt given lunch tray.

## 2015-10-22 NOTE — ED Notes (Signed)
Will attempt to obtain laboratory specimens and urine specimen after time of medication desired effect.
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
## 2015-10-22 NOTE — ED Notes (Signed)
ED BHU PLACEMENT JUSTIFICATION Is the patient under IVC or is there intent for IVC: Yes.   Is the patient medically cleared: Yes.   Is there vacancy in the ED BHU: Yes.   Is the population mix appropriate for patient: NO - brother is in BHU  Is the patient awaiting placement in inpatient or outpatient setting:   Has the patient had a psychiatric consult:  Consult pending    Survey of unit performed for contraband, proper placement and condition of furniture, tampering with fixtures in bathroom, shower, and each patient room: Yes.  ; Findings:  APPEARANCE/BEHAVIOR Calm and cooperative NEURO ASSESSMENT Orientation: oriented x3  Denies pain Hallucinations: No.None noted (Hallucinations) Speech: Normal Gait: normal RESPIRATORY ASSESSMENT Even  Unlabored respirations  CARDIOVASCULAR ASSESSMENT Pulses equal   regular rate  Skin warm and dry   GASTROINTESTINAL ASSESSMENT no GI complaint EXTREMITIES Full ROM  PLAN OF CARE Provide calm/safe environment. Vital signs assessed twice daily. ED BHU Assessment once each 12-hour shift. Collaborate with intake RN daily or as condition indicates. Assure the ED provider has rounded once each shift. Provide and encourage hygiene. Provide redirection as needed. Assess for escalating behavior; address immediately and inform ED provider.  Assess family dynamic and appropriateness for visitation as needed: Yes.  ; If necessary, describe findings:  Educate the patient/family about BHU procedures/visitation: Yes.  ; If necessary, describe findings:

## 2015-10-22 NOTE — ED Notes (Signed)
Pt observed lying in bed - with the TV on    Pt visualized with NAD she has reported to tech that she needs her klonipin  - I reported to her that the psychiatrist will order medications after he consults with her  - consult pending    Continue to monitor

## 2015-10-23 ENCOUNTER — Other Ambulatory Visit: Payer: Self-pay

## 2015-10-23 ENCOUNTER — Emergency Department: Payer: Medicare Other

## 2015-10-23 ENCOUNTER — Emergency Department
Admission: EM | Admit: 2015-10-23 | Discharge: 2015-10-23 | Disposition: A | Discharge status: Home / Self Care | Payer: Medicare Other | Attending: Emergency Medicine | Admitting: Emergency Medicine

## 2015-10-23 DIAGNOSIS — Z79899 Other long term (current) drug therapy: Secondary | ICD-10-CM | POA: Insufficient documentation

## 2015-10-23 DIAGNOSIS — F17210 Nicotine dependence, cigarettes, uncomplicated: Secondary | ICD-10-CM | POA: Insufficient documentation

## 2015-10-23 DIAGNOSIS — F259 Schizoaffective disorder, unspecified: Secondary | ICD-10-CM | POA: Insufficient documentation

## 2015-10-23 DIAGNOSIS — G40909 Epilepsy, unspecified, not intractable, without status epilepticus: Secondary | ICD-10-CM | POA: Diagnosis not present

## 2015-10-23 DIAGNOSIS — Z760 Encounter for issue of repeat prescription: Secondary | ICD-10-CM | POA: Diagnosis present

## 2015-10-23 DIAGNOSIS — R569 Unspecified convulsions: Secondary | ICD-10-CM

## 2015-10-23 LAB — COMPREHENSIVE METABOLIC PANEL
ALBUMIN: 4.8 g/dL (ref 3.5–5.0)
ALT: 14 U/L (ref 14–54)
AST: 42 U/L — AB (ref 15–41)
Alkaline Phosphatase: 69 U/L (ref 38–126)
Anion gap: 22 — ABNORMAL HIGH (ref 5–15)
BUN: 10 mg/dL (ref 6–20)
CHLORIDE: 107 mmol/L (ref 101–111)
CO2: 12 mmol/L — AB (ref 22–32)
CREATININE: 0.97 mg/dL (ref 0.44–1.00)
Calcium: 9.3 mg/dL (ref 8.9–10.3)
GFR calc Af Amer: >60 mL/min (ref >60)
GFR calc non Af Amer: >60 mL/min (ref >60)
Glucose, Bld: 183 mg/dL — ABNORMAL HIGH (ref 65–99)
POTASSIUM: 3 mmol/L — AB (ref 3.5–5.1)
SODIUM: 141 mmol/L (ref 135–145)
Total Bilirubin: 0.8 mg/dL (ref 0.3–1.2)
Total Protein: 8 g/dL (ref 6.5–8.1)

## 2015-10-23 LAB — CBC WITH DIFFERENTIAL/PLATELET
BASOS ABS: 0.1 10*3/uL (ref 0–0.1)
BASOS PCT: 1 %
EOS ABS: 0.1 10*3/uL (ref 0–0.7)
EOS PCT: 1 %
HCT: 45.4 % (ref 35.0–47.0)
Hemoglobin: 15 g/dL (ref 12.0–16.0)
LYMPHS PCT: 43 %
Lymphs Abs: 3.6 10*3/uL (ref 1.0–3.6)
MCH: 30.2 pg (ref 26.0–34.0)
MCHC: 33.1 g/dL (ref 32.0–36.0)
MCV: 91.2 fL (ref 80.0–100.0)
Monocytes Absolute: 0.6 10*3/uL (ref 0.2–0.9)
Monocytes Relative: 7 %
Neutro Abs: 4.1 10*3/uL (ref 1.4–6.5)
Neutrophils Relative %: 48 %
PLATELETS: 249 10*3/uL (ref 150–440)
RBC: 4.98 MIL/uL (ref 3.80–5.20)
RDW: 13.7 % (ref 11.5–14.5)
WBC: 8.3 10*3/uL (ref 3.6–11.0)

## 2015-10-23 LAB — ACETAMINOPHEN LEVEL: Acetaminophen (Tylenol), Serum: <10 ug/mL — ABNORMAL LOW (ref 10–30)

## 2015-10-23 LAB — SALICYLATE LEVEL: Salicylate Lvl: <4 mg/dL (ref 2.8–30.0)

## 2015-10-23 LAB — ETHANOL: Alcohol, Ethyl (B): <5 mg/dL (ref <5)

## 2015-10-23 MED ORDER — LORAZEPAM 2 MG/ML IJ SOLN
1.0000 mg | Freq: Once | INTRAMUSCULAR | Status: AC
  Administered 2015-10-23: 1 mg via INTRAVENOUS

## 2015-10-23 MED ORDER — LEVETIRACETAM 1000 MG PO TABS: 1000.0000 mg | ORAL_TABLET | Freq: Two times a day (BID) | ORAL | Status: DC

## 2015-10-23 MED ORDER — POTASSIUM CHLORIDE 20 MEQ PO PACK
PACK | ORAL | Status: AC
  Filled 2015-10-23: qty 2

## 2015-10-23 MED ORDER — LAMOTRIGINE 25 MG PO TABS: 50.0000 mg | ORAL_TABLET | Freq: Once | ORAL | Status: DC

## 2015-10-23 MED ORDER — POTASSIUM CHLORIDE CRYS ER 20 MEQ PO TBCR
40.0000 meq | EXTENDED_RELEASE_TABLET | Freq: Once | ORAL | Status: AC
  Administered 2015-10-23: 40 meq via ORAL

## 2015-10-23 MED ORDER — SODIUM CHLORIDE 0.9 % IV SOLN
1000.0000 mg | Freq: Once | INTRAVENOUS | Status: AC
  Administered 2015-10-23: 1000 mg via INTRAVENOUS
  Filled 2015-10-23: qty 10

## 2015-10-23 MED ORDER — QUETIAPINE FUMARATE 200 MG PO TABS
ORAL_TABLET | ORAL | Status: AC
  Administered 2015-10-23: 300 mg
  Filled 2015-10-23: qty 1

## 2015-10-23 MED ORDER — QUETIAPINE FUMARATE ER 300 MG PO TB24: 300.0000 mg | ORAL_TABLET | Freq: Every day | ORAL | Status: DC

## 2015-10-23 MED ORDER — LAMOTRIGINE 25 MG PO TABS
50.0000 mg | ORAL_TABLET | Freq: Every day | ORAL | Status: DC
Start: 2015-10-23 — End: 2015-10-23

## 2015-10-23 MED ORDER — SODIUM CHLORIDE 0.9 % IV BOLUS (SEPSIS)
1000.0000 mL | Freq: Once | INTRAVENOUS | Status: AC
  Administered 2015-10-23: 1000 mL via INTRAVENOUS

## 2015-10-23 MED ORDER — QUETIAPINE FUMARATE ER 300 MG PO TB24
300.0000 mg | ORAL_TABLET | Freq: Every day | ORAL | Status: DC
  Filled 2015-10-23: qty 6

## 2015-10-23 MED ORDER — LEVETIRACETAM 500 MG PO TABS: 1000.0000 mg | ORAL_TABLET | Freq: Two times a day (BID) | ORAL | Status: DC

## 2015-10-23 MED ORDER — LORAZEPAM 2 MG/ML IJ SOLN
INTRAMUSCULAR | Status: AC
  Filled 2015-10-23: qty 1

## 2015-10-23 MED ORDER — QUETIAPINE FUMARATE 200 MG PO TABS
ORAL_TABLET | ORAL | Status: AC
  Filled 2015-10-23: qty 1

## 2015-10-23 NOTE — ED Notes (Addendum)
Pt drinking apple juice in bed, easily arousable, seizure pads on pad

## 2015-10-23 NOTE — ED Notes (Addendum)
Patient transported to CT; ED tech with pt for safety

## 2015-10-23 NOTE — ED Notes (Signed)
Pt up using restroom at this time with no assistance 

## 2015-10-23 NOTE — ED Provider Notes (Signed)
[facility] Emergency Department Provider Note  ____________________________________________  Time seen: Approximately 2:24 AM  I have reviewed the triage vital signs and the nursing notes.   HISTORY  Chief Complaint Medication Refill    HPI NESIAH JUMP is a 35 y.o. female with a history of schizoaffective disorder who was recently in the EDfor psychosis. She was released by psychiatry and discharged approximately 4 PM yesterday. Night shift triage nurse noted patient sitting in the waiting room approximately 7 PM at the beginning of night shift. Patient remained in the waiting room all night and when questioned, states her father is in [facility] and will return tomorrow evening to pick her up. She was brought back to the main ED as she has had nothing to eat or drink since 4 PM, nor has she had her medicines. Patient voices no medical complaints. Denies active SI/HI/AH/VH.   Past Medical History  Diagnosis Date  . Shoulder pain, right   . Degeneration of lumbar intervertebral disc   . Seizures [facility])     Patient Active Problem List   Diagnosis Date Noted  . Schizoaffective disorder (HCC) 10/22/2015  . Opiate abuse, episodic 10/22/2015    Past Surgical History  Procedure Laterality Date  . Cesarean section      X2    Current Outpatient Rx  Name  Route  Sig  Dispense  Refill  . baclofen (LIORESAL) 10 MG tablet   Oral   Take 10 mg by mouth 3 (three) times daily.          . clonazePAM (KLONOPIN) 2 MG tablet   Oral   Take 1 tablet (2 mg total) by mouth 2 (two) times daily.   16 tablet   0   . oxyCODONE (OXY IR/ROXICODONE) 5 MG immediate release tablet   Oral   Take 5 mg by mouth every 4 (four) hours as needed for severe pain.         Marland Kitchen oxyCODONE (OXYCONTIN) 10 mg 12 hr tablet   Oral   Take 10 mg by mouth every 12 (twelve) hours.         Marland Kitchen QUEtiapine (SEROQUEL XR) 300 MG 24 hr tablet   Oral   Take 1 tablet (300 mg total) by  mouth at bedtime.   8 tablet   0   . lamoTRIgine (LAMICTAL) 25 MG tablet   Oral   Take 50 mg by mouth daily. Reported on 10/23/2015         . levETIRAcetam (KEPPRA) 1000 MG tablet   Oral   Take 1,000 mg by mouth 2 (two) times daily.         Marland Kitchen topiramate (TOPAMAX) 25 MG tablet   Oral   Take 25 mg by mouth 2 (two) times daily. Reported on 10/23/2015           Allergies Latex; Morphine; Buprenorphine hcl-naloxone hcl; Duloxetine; Ketorolac tromethamine; Prednisone; Poison ivy extract; Poison oak extract; and Sumac  No family history on file.  Social History Social History  Substance Use Topics  . Smoking status: Current Every Day Smoker    Types: Cigarettes  . Smokeless tobacco: Never Used  . Alcohol Use: No    Review of Systems  Constitutional: No fever/chills Eyes: No visual changes. ENT: No sore throat. Cardiovascular: Denies chest pain. Respiratory: Denies shortness of breath. Gastrointestinal: No abdominal pain.  No nausea, no vomiting.  No diarrhea.  No constipation. Genitourinary: Negative for dysuria. Musculoskeletal: Negative for back pain. Skin:  Negative for rash. Neurological: Negative for headaches, focal weakness or numbness. Psychiatric:Positive for recent psychosis.  10 point review of systems otherwise negative. ____________________________________________   PHYSICAL EXAM:  VITAL SIGNS: ED Triage Vitals  Enc Vitals Group     BP 10/23/15 0207 117/84 mmHg     Pulse Rate 10/23/15 0207 86     Resp 10/23/15 0207 18     Temp 10/23/15 0207 98.1 F (36.7 C)     Temp Source 10/23/15 0207 Oral     SpO2 10/23/15 0207 100 %     Weight 10/23/15 0207 120 lb (54.432 kg)     Height 10/23/15 0207 5\' 1"  (1.549 m)     Head Cir --      Peak Flow --      Pain Score --      Pain Loc --      Pain Edu? --      Excl. in GC? --     Constitutional: Alert and oriented. Well appearing and in no acute distress. Eyes: Conjunctivae are normal. PERRL.  EOMI. Head: Atraumatic. Nose: No congestion/rhinnorhea. Mouth/Throat: Mucous membranes are moist.  Oropharynx non-erythematous. Neck: No stridor.   Cardiovascular: Normal rate, regular rhythm. Grossly normal heart sounds.  Good peripheral circulation. Respiratory: Normal respiratory effort.  No retractions. Lungs CTAB. Gastrointestinal: Soft and nontender. No distention. No abdominal bruits. No CVA tenderness. Musculoskeletal: No lower extremity tenderness nor edema.  No joint effusions. Neurologic:  Normal speech and language. No gross focal neurologic deficits are appreciated. No gait instability. Skin:  Skin is warm, dry and intact. No rash noted. Psychiatric: Mood and affect are flat. Speech and behavior are flat.  ____________________________________________   LABS (all labs ordered are listed, but only abnormal results are displayed)  Labs Reviewed  COMPREHENSIVE METABOLIC PANEL - Abnormal; Notable for the following:    Potassium 3.0 (*)    CO2 12 (*)    Glucose, Bld 183 (*)    AST 42 (*)    Anion gap 22 (*)    All other components within normal limits  ACETAMINOPHEN LEVEL - Abnormal; Notable for the following:    Acetaminophen (Tylenol), Serum <10 (*)    All other components within normal limits  CBC WITH DIFFERENTIAL/PLATELET  SALICYLATE LEVEL  ETHANOL  URINALYSIS COMPLETEWITH MICROSCOPIC ([facility] ONLY)  URINE DRUG SCREEN, QUALITATIVE ([facility] ONLY)  POC URINE PREG, ED   ____________________________________________  EKG  None ____________________________________________  RADIOLOGY  None ____________________________________________   PROCEDURES  Procedure(s) performed: None  Critical Care performed: No  ____________________________________________   INITIAL IMPRESSION / ASSESSMENT AND PLAN / ED COURSE  Pertinent labs & imaging results that were available during my care of the patient were reviewed by me and considered in my medical decision making (see  chart for details).  35 year old female with a history of schizoaffective disorder who was discharged by psychiatry yesterday afternoon and brought back to the main ED to receive food and medications as her family will not be able to pick her up until tomorrow evening. She was given her nighttime dose of Seroquel and [facility].  ----------------------------------------- 3:34 AM on 10/23/2015 -----------------------------------------  Patient had mild tonic-clonic seizure while in BHU; fell forward and struck her forehead. Has a small hematoma to left forehead. Cervical spine is nontender without step-offs or deformities. Per pharmacy tech, patient has not filled her prescriptions for Lamictal and Keppra since January. She is supposed to be taking Lamictal 25 mg tablets 2  tablets daily, and Keppra 1000 mg twice daily. Will repeat lab work, obtain CT head, administer IV Ativan and fluids.  ----------------------------------------- 6:37 AM on 10/23/2015 -----------------------------------------  Patient is currently sleeping in no acute distress. Heart rate 96, BP 99/72. Will place standing order for patient's medications. She is to be reevaluated by Dr. Di Kindle from psychiatry as well as clinical social work as she is not safe to return home to live independently. ____________________________________________   FINAL CLINICAL IMPRESSION(S) / ED DIAGNOSES  Final diagnoses:  Schizoaffective disorder, unspecified type (HCC)  Seizure (HCC)      Irean Hong, MD 10/23/15 [phone]

## 2015-10-23 NOTE — ED Notes (Signed)
Report given to BIll, pt moved to the quad room 23, Dr. Inocencio HomesGayle made aware

## 2015-10-23 NOTE — ED Notes (Signed)
Acuity of patient changed to ESI level to to reflect change in patient condition.

## 2015-10-23 NOTE — ED Notes (Signed)
Pt was transported to the ED BHU accompanied by Misty StanleyLisa, ED Tech without difficulty. A couple of minutes [facility] Sigurd Pugh RN and [place], ED Tech noticed that the Pt fell to the ground and was experiencing seizure like activity. Sherilyn CooterHenry RN and Misty StanleyLisa went to the Pt's room and placed the Pt in the recovery position. Misty StanleyLisa went to get a stretcher and notify Dr. Dolores FrameSung of the event. Pt was placed on the stretcher and taken to CT in rout to the main ED.  Pt was post-ictal following the event with a notable hematoma to the left orbital.

## 2015-10-23 NOTE — ED Notes (Signed)
Pt resting in bed, eyes closed, sitter at bedside, vitals WDL

## 2015-10-23 NOTE — ED Notes (Signed)
Pt was discharged from ED yest at 1600 pt has been in ED waiting room since states father coming for her but [place]  Pt has flat affect and slow to answer question, pt seems disoriented on way home.  Uncle contacted and he is unable to pick her up and states he does not feel like patient is able to be sent home alone due to delusional disorder and paranoia. States her father will return Thursday in the evening and will pick  Her up then.  Spoke with Dr Dolores FrameSung and decided to recheck patient back in due to mental status and provide psych medications needed and will discharge to father when he arrives.  Pt noted to be sitting in same chair in WR has not eaten or used the restroom since discharged at 1600.

## 2015-10-23 NOTE — ED Provider Notes (Addendum)
-----------------------------------------   10:52 AM on 10/23/2015 -----------------------------------------  Care was assumed from Dr. Dolores FrameSung at 7 AM. Previously this is a 35 year old female with history of schizoaffective disorder and seizures who was discharged yesterday from the emergency department after being evaluated by psychiatry for possible psychosis. She was thought to be in a stable mood and did not require inpatient hospitalization however she was found waiting in the waiting room until the wee hours of the morning. She was unable to find a ride home. She was brought back into the emergency department for observation, had a seizure, received her home medications and has been waiting for a repeat psychiatric evaluation. At this time, she is awake, alert, oriented, ambulating and requesting discharge. She is calling her uncle who will provide a ride home. She is not homicidal, suicidal, has no hallucinations and there is no indication for involuntary commitment therefore she'll be discharged with outpatient psychiatric follow-up. DC home. She has been observed in this emergency department for greater than 5 hours without recurrence of seizure activity.  Gayla DossEryka A Chiante Peden, MD 10/23/15 1054  Gayla DossEryka A Janayia Burggraf, MD 10/23/15 1059

## 2015-10-23 NOTE — ED Notes (Signed)
Pt had seizure like activity while in the BHU. Hematoma noted to the left eyebrow and eyelid. Pt not answering questions when asked. Tells staff to stop when attempting to place iv and evaluate pt. No increased work of breathing or acute distress noted at this time.

## 2015-10-23 NOTE — ED Notes (Signed)
Adrienne StanleyLisa, EDT placed at bedside with patient as a Recruitment consultantsafety sitter secondary to patient being acutely confused s/p seizure; patient attempting to remove medical lines and equipment. MD aware.

## 2015-10-24 ENCOUNTER — Emergency Department
Admission: EM | Admit: 2015-10-24 | Discharge: 2015-10-26 | Disposition: A | Discharge status: Other Acute Inpatient Hospital | Payer: Medicare Other | Attending: Emergency Medicine | Admitting: Emergency Medicine

## 2015-10-24 ENCOUNTER — Encounter: Payer: Self-pay | Admitting: Emergency Medicine

## 2015-10-24 DIAGNOSIS — F17210 Nicotine dependence, cigarettes, uncomplicated: Secondary | ICD-10-CM | POA: Insufficient documentation

## 2015-10-24 DIAGNOSIS — Z8669 Personal history of other diseases of the nervous system and sense organs: Secondary | ICD-10-CM | POA: Diagnosis not present

## 2015-10-24 DIAGNOSIS — F1110 Opioid abuse, uncomplicated: Secondary | ICD-10-CM | POA: Diagnosis present

## 2015-10-24 DIAGNOSIS — F250 Schizoaffective disorder, bipolar type: Secondary | ICD-10-CM | POA: Diagnosis not present

## 2015-10-24 DIAGNOSIS — F918 Other conduct disorders: Secondary | ICD-10-CM | POA: Diagnosis present

## 2015-10-24 DIAGNOSIS — M25511 Pain in right shoulder: Secondary | ICD-10-CM

## 2015-10-24 DIAGNOSIS — F259 Schizoaffective disorder, unspecified: Secondary | ICD-10-CM | POA: Diagnosis not present

## 2015-10-24 LAB — COMPREHENSIVE METABOLIC PANEL
ALT: 9 U/L — ABNORMAL LOW (ref 14–54)
AST: 17 U/L (ref 15–41)
Albumin: 4.5 g/dL (ref 3.5–5.0)
Alkaline Phosphatase: 61 U/L (ref 38–126)
Anion gap: 11 (ref 5–15)
BILIRUBIN TOTAL: 0.9 mg/dL (ref 0.3–1.2)
BUN: 11 mg/dL (ref 6–20)
CHLORIDE: 111 mmol/L (ref 101–111)
CO2: 19 mmol/L — ABNORMAL LOW (ref 22–32)
Calcium: 9.3 mg/dL (ref 8.9–10.3)
Creatinine, Ser: 0.74 mg/dL (ref 0.44–1.00)
Glucose, Bld: 89 mg/dL (ref 65–99)
POTASSIUM: 3.6 mmol/L (ref 3.5–5.1)
Sodium: 141 mmol/L (ref 135–145)
Total Protein: 7.5 g/dL (ref 6.5–8.1)

## 2015-10-24 LAB — URINE DRUG SCREEN, QUALITATIVE (ARMC ONLY)
AMPHETAMINES, UR SCREEN: NOT DETECTED
Barbiturates, Ur Screen: NOT DETECTED
Benzodiazepine, Ur Scrn: POSITIVE — AB
COCAINE METABOLITE, UR ~~LOC~~: NOT DETECTED
Cannabinoid 50 Ng, Ur ~~LOC~~: NOT DETECTED
MDMA (Ecstasy)Ur Screen: NOT DETECTED
METHADONE SCREEN, URINE: NOT DETECTED
OPIATE, UR SCREEN: NOT DETECTED
Phencyclidine (PCP) Ur S: NOT DETECTED
Tricyclic, Ur Screen: POSITIVE — AB

## 2015-10-24 LAB — ACETAMINOPHEN LEVEL: Acetaminophen (Tylenol), Serum: <10 ug/mL — ABNORMAL LOW (ref 10–30)

## 2015-10-24 LAB — CBC
HCT: 41.7 % (ref 35.0–47.0)
Hemoglobin: 14.1 g/dL (ref 12.0–16.0)
MCH: 29.9 pg (ref 26.0–34.0)
MCHC: 33.9 g/dL (ref 32.0–36.0)
MCV: 88.2 fL (ref 80.0–100.0)
Platelets: 254 10*3/uL (ref 150–440)
RBC: 4.73 MIL/uL (ref 3.80–5.20)
RDW: 13.4 % (ref 11.5–14.5)
WBC: 9.6 10*3/uL (ref 3.6–11.0)

## 2015-10-24 LAB — PREGNANCY, URINE: PREG TEST UR: NEGATIVE

## 2015-10-24 LAB — ETHANOL

## 2015-10-24 LAB — SALICYLATE LEVEL

## 2015-10-24 MED ORDER — OXYCODONE HCL ER 10 MG PO T12A
10.0000 mg | EXTENDED_RELEASE_TABLET | Freq: Two times a day (BID) | ORAL | Status: DC
  Administered 2015-10-24 – 2015-10-25 (×3): 10 mg via ORAL
  Filled 2015-10-24 (×3): qty 1

## 2015-10-24 MED ORDER — ZIPRASIDONE MESYLATE 20 MG IM SOLR
INTRAMUSCULAR | Status: AC
  Filled 2015-10-24: qty 20

## 2015-10-24 MED ORDER — TOPIRAMATE 25 MG PO TABS
25.0000 mg | ORAL_TABLET | Freq: Two times a day (BID) | ORAL | Status: DC
  Administered 2015-10-25 (×2): 25 mg via ORAL
  Filled 2015-10-24 (×6): qty 1

## 2015-10-24 MED ORDER — CLONAZEPAM 1 MG PO TABS
2.0000 mg | ORAL_TABLET | Freq: Two times a day (BID) | ORAL | Status: DC
  Administered 2015-10-24 – 2015-10-25 (×3): 2 mg via ORAL
  Filled 2015-10-24 (×2): qty 4
  Filled 2015-10-24: qty 2

## 2015-10-24 MED ORDER — QUETIAPINE FUMARATE ER 300 MG PO TB24
300.0000 mg | ORAL_TABLET | Freq: Every day | ORAL | Status: DC
  Administered 2015-10-24 – 2015-10-25 (×2): 300 mg via ORAL
  Filled 2015-10-24 (×3): qty 1
  Filled 2015-10-24: qty 6

## 2015-10-24 MED ORDER — NICOTINE 21 MG/24HR TD PT24
21.0000 mg | MEDICATED_PATCH | Freq: Once | TRANSDERMAL | Status: AC
  Administered 2015-10-24 – 2015-10-25 (×2): 21 mg via TRANSDERMAL
  Filled 2015-10-24: qty 1

## 2015-10-24 MED ORDER — LAMOTRIGINE 25 MG PO TABS
50.0000 mg | ORAL_TABLET | Freq: Every day | ORAL | Status: DC
Start: 2015-10-24 — End: 2015-10-24
  Filled 2015-10-24: qty 2

## 2015-10-24 MED ORDER — LEVETIRACETAM 500 MG PO TABS
1000.0000 mg | ORAL_TABLET | Freq: Two times a day (BID) | ORAL | Status: DC
  Administered 2015-10-25: 1000 mg via ORAL
  Filled 2015-10-24 (×3): qty 2

## 2015-10-24 MED ORDER — BACLOFEN 10 MG PO TABS
10.0000 mg | ORAL_TABLET | Freq: Three times a day (TID) | ORAL | Status: DC
  Administered 2015-10-25 (×3): 10 mg via ORAL
  Filled 2015-10-24 (×4): qty 1
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
## 2015-10-24 NOTE — ED Provider Notes (Signed)
[facility] Emergency Department Provider Note  ____________________________________________    I have reviewed the triage vital signs and the nursing notes.   HISTORY  Chief Complaint IVC   History limited by mental illness  HPI [name] is a 35 y.o. female who presents via Fish farm manager for bizarre behavior. Patient was apparently knocking on the neighbor's door and stating that someone was coming to kill her. Patient with a history of mental illness. Recently discharged from our ED.     Past Medical History  Diagnosis Date  . Shoulder pain, right   . Degeneration of lumbar intervertebral disc   . Seizures [facility])     Patient Active Problem List   Diagnosis Date Noted  . Schizoaffective disorder (HCC) 10/22/2015  . Opiate abuse, episodic 10/22/2015    Past Surgical History  Procedure Laterality Date  . Cesarean section      X2    Current Outpatient Rx  Name  Route  Sig  Dispense  Refill  . baclofen (LIORESAL) 10 MG tablet   Oral   Take 10 mg by mouth 3 (three) times daily.          . clonazePAM (KLONOPIN) 2 MG tablet   Oral   Take 1 tablet (2 mg total) by mouth 2 (two) times daily.   16 tablet   0   . levETIRAcetam (KEPPRA) 1000 MG tablet   Oral   Take 1 tablet (1,000 mg total) by mouth 2 (two) times daily.   60 tablet   0   . meloxicam (MOBIC) 15 MG tablet   Oral   Take 15 mg by mouth daily. with food      5   . oxyCODONE (OXY IR/ROXICODONE) 5 MG immediate release tablet   Oral   Take 5-10 mg by mouth See admin instructions. Take 1 to 2 tablets by mouth every 4 to 6 hours.      0   . OXYCONTIN 10 MG 12 hr tablet   Oral   Take 10 mg by mouth 2 (two) times daily.      0     Dispense as written.   Marland Kitchen QUEtiapine (SEROQUEL XR) 300 MG 24 hr tablet   Oral   Take 1 tablet (300 mg total) by mouth at bedtime.   8 tablet   0     Allergies Latex; Morphine; Prednisone; Buprenorphine  hcl-naloxone hcl; Duloxetine; Ketorolac tromethamine; Tramadol; Poison ivy extract; Poison oak extract; and Sumac  No family history on file.  Social History Social History  Substance Use Topics  . Smoking status: Current Every Day Smoker    Types: Cigarettes  . Smokeless tobacco: Never Used  . Alcohol Use: No    L5 caveat: Unable to obtain Review of Systems due to lack of cooperation      ____________________________________________   PHYSICAL EXAM:  VITAL SIGNS: ED Triage Vitals  Enc Vitals Group     BP 10/24/15 1730 113/88 mmHg     Pulse Rate 10/24/15 1730 137     Resp 10/24/15 1730 18     Temp 10/24/15 1730 97.7 F (36.5 C)     Temp Source 10/24/15 1730 Oral     SpO2 10/24/15 1730 96 %     Weight 10/24/15 1730 130 lb (58.968 kg)     Height 10/24/15 1730  (1.549 m)     Head Cir --      Peak Flow --  Pain Score --      Pain Loc --      Pain Edu? --      Excl. in GC? --      Constitutional: Agitated and aggressive Eyes: Conjunctivae are normal. No erythema or injection ENT   Head: Normocephalic. Bruise to the left forehead   Mouth/Throat: Mucous membranes are moist. Cardiovascular: Tachycardia, regular rhythm. Normal and symmetric distal pulses are present in the upper extremities.  Respiratory: Normal respiratory effort without tachypnea nor retractions. Breath sounds are clear and equal bilaterally.  Gastrointestinal: Soft and non-tender in all quadrants. No distention. There is no CVA tenderness. Genitourinary: deferred Musculoskeletal: Nontender with normal range of motion in all extremities. No lower extremity tenderness nor edema. Neurologic:  Normal speech and language. No gross focal neurologic deficits are appreciated. Skin:  Skin is warm, dry and intact. No rash noted. Psychiatric: Bizarre behavior, confusion and paranoia  ____________________________________________    LABS (pertinent positives/negatives)  Labs Reviewed   COMPREHENSIVE METABOLIC PANEL - Abnormal; Notable for the following:    CO2 19 (*)    ALT 9 (*)    All other components within normal limits  ACETAMINOPHEN LEVEL - Abnormal; Notable for the following:    Acetaminophen (Tylenol), Serum <10 (*)    All other components within normal limits  URINE DRUG SCREEN, QUALITATIVE ([facility] ONLY) - Abnormal; Notable for the following:    Tricyclic, Ur Screen POSITIVE (*)    Benzodiazepine, Ur Scrn POSITIVE (*)    All other components within normal limits  ETHANOL  SALICYLATE LEVEL  CBC  PREGNANCY, URINE    ____________________________________________   EKG  None  ____________________________________________    RADIOLOGY  None  ____________________________________________   PROCEDURES  Procedure(s) performed: none  Critical Care performed: none  ____________________________________________   INITIAL IMPRESSION / ASSESSMENT AND PLAN / ED COURSE  Pertinent labs & imaging results that were available during my care of the patient were reviewed by me and considered in my medical decision making (see chart for details).  Patient with history of mental illness presents with paranoia and bizarre behavior. We will consult TTS and psychiatry. I will complete the IVC. Home medications ordered. Patient is medically cleared for psychiatric treatment  ____________________________________________   FINAL CLINICAL IMPRESSION(S) / ED DIAGNOSES  Final diagnoses:  Schizoaffective disorder, unspecified type (HCC)          Jene Everyobert Oline Belk, MD 10/24/15 2158
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
## 2015-10-24 NOTE — ED Notes (Signed)
TTS at bedside. 

## 2015-10-25 ENCOUNTER — Emergency Department: Payer: Medicare Other

## 2015-10-25 DIAGNOSIS — F259 Schizoaffective disorder, unspecified: Secondary | ICD-10-CM | POA: Diagnosis not present

## 2015-10-25 DIAGNOSIS — M25511 Pain in right shoulder: Secondary | ICD-10-CM

## 2015-10-25 DIAGNOSIS — F250 Schizoaffective disorder, bipolar type: Secondary | ICD-10-CM | POA: Diagnosis not present

## 2015-10-25 MED ORDER — ACETAMINOPHEN 325 MG PO TABS
650.0000 mg | ORAL_TABLET | Freq: Once | ORAL | Status: AC
  Administered 2015-10-25: 650 mg via ORAL
  Filled 2015-10-25: qty 2

## 2015-10-25 MED ORDER — OXYCODONE HCL 5 MG PO TABS
10.0000 mg | ORAL_TABLET | Freq: Four times a day (QID) | ORAL | Status: DC | PRN
  Administered 2015-10-25 – 2015-10-26 (×2): 10 mg via ORAL
  Filled 2015-10-25 (×2): qty 2

## 2015-10-25 MED ORDER — NICOTINE 21 MG/24HR TD PT24
MEDICATED_PATCH | TRANSDERMAL | Status: AC
  Administered 2015-10-25: 21 mg via TRANSDERMAL
  Filled 2015-10-25: qty 1

## 2015-10-25 MED ORDER — DIPHENHYDRAMINE HCL 25 MG PO CAPS
50.0000 mg | ORAL_CAPSULE | Freq: Once | ORAL | Status: AC
  Administered 2015-10-25: 50 mg via ORAL
  Filled 2015-10-25: qty 2

## 2015-10-25 MED ORDER — MELOXICAM 7.5 MG PO TABS
15.0000 mg | ORAL_TABLET | Freq: Every day | ORAL | Status: DC
  Administered 2015-10-25: 15 mg via ORAL
  Filled 2015-10-25: qty 2
  Filled 2015-10-25: qty 1

## 2015-10-25 MED ORDER — NICOTINE 21 MG/24HR TD PT24
21.0000 mg | MEDICATED_PATCH | Freq: Once | TRANSDERMAL | Status: DC
  Administered 2015-10-25: 21 mg via TRANSDERMAL

## 2015-10-25 MED ORDER — BACLOFEN 10 MG PO TABS
ORAL_TABLET | ORAL | Status: AC
  Administered 2015-10-25: 10 mg via ORAL
  Filled 2015-10-25: qty 1

## 2015-10-25 MED ORDER — IBUPROFEN 600 MG PO TABS
600.0000 mg | ORAL_TABLET | Freq: Once | ORAL | Status: AC
  Administered 2015-10-25: 600 mg via ORAL
  Filled 2015-10-25: qty 1
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
## 2015-10-25 NOTE — ED Notes (Signed)
Pt makes repeated trips to the door to ask for pain medication and to c/o neck pain. Pt states I feel like I'm going to pass out, this RN instructed patient to lay back down if she felt like she was going to pass out. Pt states "Is this a hospital or is this a jail?" This RN explained to patient that the hospital had rules, pt cooperative in laying down but appears mildly agitated at this time, rolling eyes and interrupting this RN.

## 2015-10-25 NOTE — ED Notes (Signed)
Patient in dayroom. She is somewhat quieter and in good behavioral control. Maintained on all safety checks.

## 2015-10-25 NOTE — ED Notes (Signed)
Pt lying flat in bed with eyes open, resps unlabored. Pt with hands visible above blanket.

## 2015-10-25 NOTE — ED Notes (Signed)
Patient has been able to tolerate sitting in day room. Still exhibiting s/s mania with delusional thinking, tangential thoughts and speech.  Maintained on all safety checks.

## 2015-10-25 NOTE — ED Notes (Signed)
Patient very manic, constant rapid speech, delusional thinking.  She did take medications with strong encouragement. She also took a shower. Maintained on 15 minute checks and observation by security camera for safety.

## 2015-10-25 NOTE — ED Notes (Signed)
Patient more irritable and reactive to milieu. She met with psychiatrist for assessment. Maintained on all safety precautions.

## 2015-10-25 NOTE — ED Notes (Signed)
Patient given breakfast tray. Patient sitting in bed eating breakfast at this time.

## 2015-10-25 NOTE — ED Notes (Signed)
Pt c/o L sided eye pain, MD notified. Pt also has multiple pain complaints, her head, neck, shoulder, back, hip and legs. MD aware of patient's pain complaints at this time.

## 2015-10-25 NOTE — ED Notes (Signed)

## 2015-10-25 NOTE — ED Notes (Signed)
Report to megan, rn.  

## 2015-10-25 NOTE — ED Notes (Signed)
Pt given phone for phone call. Pt explained she has 1 call for 10 mins at this time.

## 2015-10-25 NOTE — ED Notes (Signed)
Pt requesting to speak with md. Dr. Zenda AlpersWebster notified and in to speak with pt.

## 2015-10-25 NOTE — ED Notes (Signed)
Patient with frequent requests for numerous medications. Poor response to explanations regarding need to see psychiatrist for new orders. She continues to exhibit delusional thinking with poor response to reality orientation. She is able to sit calmly in the day area. Maintained on 15 minute checks and observation by security camera for safety.

## 2015-10-25 NOTE — ED Notes (Signed)
Maintained on 15 minute checks and observation by security camera for safety. 

## 2015-10-25 NOTE — ED Notes (Signed)
IVC/Pending consult °

## 2015-10-25 NOTE — ED Notes (Signed)
Pt transported to X-ray. Vernona RiegerLaura, EDT and officer Stahl to accompany.

## 2015-10-25 NOTE — ED Notes (Signed)
Patient able to sit in dayroom with other patients watching television. Maintained on 15 minute checks and observation by security camera for safety.

## 2015-10-25 NOTE — Consult Note (Signed)
[facility] Consult   Reason for Consult:  Consult for 35 year old woman with a history of schizoaffective disorder. Seen in the emergency room just a couple days ago she went home but was sent back here under commitment filed by neighbors Referring Physician:  Quale Patient Identification: [name] MRN:  [id number] Principal Diagnosis: Schizoaffective disorder [facility]) Diagnosis:   Patient Active Problem List   Diagnosis Date Noted  . Shoulder pain, right [M25.511] 10/25/2015  . Schizoaffective disorder ([place]) [F25.9] 10/22/2015  . 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 female patient admitted with "I really didn't do anything wrong".  HPI:  Patient evaluated. Patient interviewed. Case discussed with nursing. Chart and labs reviewed. Patient is a 35 year old woman with a history of schizoaffective disorder. She was seen in the emergency room 2 days ago but was discharged with a plan that she get her medicine filled and follow-up with her primary psychiatrist. She went back home and apparently was behaving in an agitated and bizarre manner. Neighbors were concerned that she was banging on doors and not making any sense making paranoid statements. On presentation to the emergency room nursing also assess the patient as being agitated confused and paranoid. By the time I saw her the patient had calmed herself down a little bit and was minimizing or denying symptoms. Denied to me that she was hallucinating. Denied that her behavior was unusual. She denied that she was abusing drugs or alcohol and said that she had taken her medicine as prescribed. Patient has had recent stresses related to conflict at home and also being off her medicine for several weeks.  Social history: She lives with her father and stepbrother. Stepbrother also is chronically mentally ill and is currently admitted  to the psychiatry ward here. Her father evidently is out of town for a couple days which left her sort of loose ends at home.  Medical history: Patient has chronic musculoskeletal pain and is prescribed narcotic pain medicines regularly for it.  Substance abuse history: Patient denies that she abuses her opiates although there has been concern raised about the appropriateness of her continued use of them in the past.  Past Psychiatric History: Patient has a long history of schizoaffective disorder. Multiple presentations with psychosis. Denies suicide attempts denies violence. She has had inpatient hospitalizations in the past.  Risk to Self: Suicidal Ideation: No Suicidal Intent: No Is patient at risk for suicide?: No Suicidal Plan?: No Access to Means: No What has been your use of drugs/alcohol within the last 12 months?: None Reported How many times?: 0 Other Self Harm Risks: None Reported Triggers for Past Attempts: None known Intentional Self Injurious Behavior: None Risk to Others: Homicidal Ideation: No Thoughts of Harm to Others: No Current Homicidal Intent: No Current Homicidal Plan: No Access to Homicidal Means: No Identified Victim: None Reported History of harm to others?: No Assessment of Violence: None Noted Violent Behavior Description: N/A Does patient have access to weapons?: No Criminal Charges Pending?: No Does patient have a court date: No Prior Inpatient Therapy: Prior Inpatient Therapy: Yes Prior Therapy Dates: 10/23/2015 Prior Therapy Facilty/Provider(s): [facility] Reason for Treatment: Psychosis Prior Outpatient Therapy: Prior Outpatient Therapy: Yes Prior Therapy Dates: Current Prior Therapy Facilty/Provider(s): [facility] Reason for Treatment: Paranoia/Delusions/Medication Management Does patient have an ACCT team?: No Does patient have Intensive In-House Services?  : No Does patient have [employer]  services? : No Does patient have P4CC services?:  No  Past Medical History:  Past Medical History  Diagnosis Date  . Shoulder pain, right   . Degeneration of lumbar intervertebral disc   . Seizures [facility])     Past Surgical History  Procedure Laterality Date  . Cesarean section      X2   Family History: No family history on file. Family Psychiatric  History: She has a stepbrother who is also chronically psychotic. Social History:  History  Alcohol Use No     History  Drug Use No    Social History   Social History  . Marital Status: Divorced    Spouse Name: N/A  . Number of Children: N/A  . Years of Education: N/A   Social History Main Topics  . Smoking status: Current Every Day Smoker    Types: Cigarettes  . Smokeless tobacco: Never Used  . Alcohol Use: No  . Drug Use: No  . Sexual Activity: Not Asked   Other Topics Concern  . None   Social History Narrative   Additional Social History:    Allergies:   Allergies  Allergen Reactions  . Latex Rash and Swelling    Other reaction(s): Unknown Skin turns red  . Morphine Other (See Comments) and Swelling    Patient states that her reaction is that her skin gets a bit red.   No urticaria or airway difficulty.    . Prednisone Other (See Comments)    Other reaction(s): Other (See Comments) Stayed up 30 days when pt. Was on prednisone insomnia  . Buprenorphine Hcl-Naloxone Hcl   . Duloxetine Other (See Comments)    Unknown  . Ketorolac Tromethamine   . Tramadol     Other reaction(s): Other (See Comments) Seizures  . Poison Ivy Extract [Extract Of Poison Ivy] Rash  . Poison Oak Extract [Extract Of Poison Oak] Rash  . Sumac Rash    Labs:  Results for orders placed or performed during the hospital encounter of 10/24/15 (from the past 48 hour(s))  Comprehensive metabolic panel     Status: Abnormal   Collection Time: 10/24/15  5:32 PM  Result Value Ref Range   Sodium 141 135 - 145 mmol/L   Potassium 3.6 3.5 - 5.1 mmol/L   Chloride 111 101 - 111 mmol/L    CO2 19 (L) 22 - 32 mmol/L   Glucose, Bld 89 65 - 99 mg/dL   BUN 11 6 - 20 mg/dL   Creatinine, Ser 0.74 0.44 - 1.00 mg/dL   Calcium 9.3 8.9 - 10.3 mg/dL   Total Protein 7.5 6.5 - 8.1 g/dL   Albumin 4.5 3.5 - 5.0 g/dL   AST 17 15 - 41 U/L   ALT 9 (L) 14 - 54 U/L   Alkaline Phosphatase 61 38 - 126 U/L   Total Bilirubin 0.9 0.3 - 1.2 mg/dL   GFR calc non Af Amer >60 >60 mL/min   GFR calc Af Amer >60 >60 mL/min    Comment: (NOTE) The eGFR has been calculated using the CKD EPI equation. This calculation has not been validated in all clinical situations. eGFR's persistently <60 mL/min signify possible Chronic Kidney Disease.    Anion gap 11 5 - 15  Ethanol     Status: None   Collection Time: 10/24/15  5:32 PM  Result Value Ref Range   Alcohol, Ethyl (B) <5 <5 mg/dL    Comment:        LOWEST DETECTABLE LIMIT  FOR SERUM ALCOHOL IS 5 mg/dL FOR MEDICAL PURPOSES ONLY   Salicylate level     Status: None   Collection Time: 10/24/15  5:32 PM  Result Value Ref Range   Salicylate Lvl <9.2 2.8 - 30.0 mg/dL  Acetaminophen level     Status: Abnormal   Collection Time: 10/24/15  5:32 PM  Result Value Ref Range   Acetaminophen (Tylenol), Serum <10 (L) 10 - 30 ug/mL    Comment:        THERAPEUTIC CONCENTRATIONS VARY SIGNIFICANTLY. A RANGE OF 10-30 ug/mL MAY BE AN EFFECTIVE CONCENTRATION FOR MANY PATIENTS. HOWEVER, SOME ARE BEST TREATED AT CONCENTRATIONS OUTSIDE THIS RANGE. ACETAMINOPHEN CONCENTRATIONS >150 ug/mL AT 4 HOURS AFTER INGESTION AND >50 ug/mL AT 12 HOURS AFTER INGESTION ARE OFTEN ASSOCIATED WITH TOXIC REACTIONS.   cbc     Status: None   Collection Time: 10/24/15  5:32 PM  Result Value Ref Range   WBC 9.6 3.6 - 11.0 K/uL   RBC 4.73 3.80 - 5.20 MIL/uL   Hemoglobin 14.1 12.0 - 16.0 g/dL   HCT 41.7 35.0 - 47.0 %   MCV 88.2 80.0 - 100.0 fL   MCH 29.9 26.0 - 34.0 pg   MCHC 33.9 32.0 - 36.0 g/dL   RDW 13.4 11.5 - 14.5 %   Platelets 254 150 - 440 K/uL  Urine Drug Screen,  Qualitative     Status: Abnormal   Collection Time: 10/24/15  5:32 PM  Result Value Ref Range   Tricyclic, Ur Screen POSITIVE (A) NONE DETECTED   Amphetamines, Ur Screen NONE DETECTED NONE DETECTED   MDMA (Ecstasy)Ur Screen NONE DETECTED NONE DETECTED   Cocaine Metabolite,Ur [place] NONE DETECTED NONE DETECTED   Opiate, Ur Screen NONE DETECTED NONE DETECTED   Phencyclidine (PCP) Ur S NONE DETECTED NONE DETECTED   Cannabinoid 50 Ng, Ur [place] NONE DETECTED NONE DETECTED   Barbiturates, Ur Screen NONE DETECTED NONE DETECTED   Benzodiazepine, Ur Scrn POSITIVE (A) NONE DETECTED   Methadone Scn, Ur NONE DETECTED NONE DETECTED    Comment: (NOTE) 426  Tricyclics, urine               Cutoff 1000 ng/mL 200  Amphetamines, urine             Cutoff 1000 ng/mL 300  MDMA (Ecstasy), urine           Cutoff 500 ng/mL 400  Cocaine Metabolite, urine       Cutoff 300 ng/mL 500  Opiate, urine                   Cutoff 300 ng/mL 600  Phencyclidine (PCP), urine      Cutoff 25 ng/mL 700  Cannabinoid, urine              Cutoff 50 ng/mL 800  Barbiturates, urine             Cutoff 200 ng/mL 900  Benzodiazepine, urine           Cutoff 200 ng/mL 1000 Methadone, urine                Cutoff 300 ng/mL 1100 1200 The urine drug screen provides only a preliminary, unconfirmed 1300 analytical test result and should not be used for non-medical 1400 purposes. Clinical consideration and professional judgment should 1500 be applied to any positive drug screen result due to possible 1600 interfering substances. A more specific alternate chemical method 1700 must be used in order to obtain a  confirmed analytical result.  1800 Gas chromato graphy / mass spectrometry (GC/MS) is the preferred 1900 confirmatory method.   Pregnancy, urine     Status: None   Collection Time: 10/24/15  5:32 PM  Result Value Ref Range   Preg Test, Ur NEGATIVE NEGATIVE    Current Facility-Administered Medications  Medication Dose Route Frequency  Provider Last Rate Last Dose  . baclofen (LIORESAL) tablet 10 mg  10 mg Oral TID Lavonia Drafts, MD   10 mg at 10/25/15 4401  . clonazePAM (KLONOPIN) tablet 2 mg  2 mg Oral BID Lavonia Drafts, MD   2 mg at 10/25/15 0272  . levETIRAcetam (KEPPRA) tablet 1,000 mg  1,000 mg Oral BID Lavonia Drafts, MD   1,000 mg at 10/24/15 2152  . meloxicam (MOBIC) tablet 15 mg  15 mg Oral Daily Harvest Dark, MD   15 mg at 10/25/15 1042  . nicotine (NICODERM CQ - dosed in mg/24 hours) patch 21 mg  21 mg Transdermal Once Lavonia Drafts, MD   21 mg at 10/24/15 1828  . oxyCODONE (Oxy IR/ROXICODONE) immediate release tablet 10 mg  10 mg Oral Q6H PRN Gonzella Lex, MD      . oxyCODONE (OXYCONTIN) 12 hr tablet 10 mg  10 mg Oral Q12H Lavonia Drafts, MD   10 mg at 10/25/15 0951  . QUEtiapine (SEROQUEL XR) 24 hr tablet 300 mg  300 mg Oral QHS Lavonia Drafts, MD   300 mg at 10/24/15 2151  . topiramate (TOPAMAX) tablet 25 mg  25 mg Oral BID Lavonia Drafts, MD   25 mg at 10/25/15 1042   Current Outpatient Prescriptions  Medication Sig Dispense Refill  . baclofen (LIORESAL) 10 MG tablet Take 10 mg by mouth 3 (three) times daily.     . clonazePAM (KLONOPIN) 2 MG tablet Take 1 tablet (2 mg total) by mouth 2 (two) times daily. 16 tablet 0  . levETIRAcetam (KEPPRA) 1000 MG tablet Take 1 tablet (1,000 mg total) by mouth 2 (two) times daily. 60 tablet 0  . meloxicam (MOBIC) 15 MG tablet Take 15 mg by mouth daily. with food  5  . oxyCODONE (OXY IR/ROXICODONE) 5 MG immediate release tablet Take 5-10 mg by mouth See admin instructions. Take 1 to 2 tablets by mouth every 4 to 6 hours.  0  . OXYCONTIN 10 MG 12 hr tablet Take 10 mg by mouth 2 (two) times daily.  0  . QUEtiapine (SEROQUEL XR) 300 MG 24 hr tablet Take 1 tablet (300 mg total) by mouth at bedtime. 8 tablet 0    Musculoskeletal: Strength & Muscle Tone: within normal limits Gait & Station: normal Patient leans: N/A  Psychiatric Specialty Exam: Review of Systems   Constitutional: Negative.   HENT: Negative.   Eyes: Negative.   Respiratory: Negative.   Cardiovascular: Negative.   Gastrointestinal: Negative.   Musculoskeletal: Positive for joint pain.  Skin: Negative.   Neurological: Negative.   Psychiatric/Behavioral: Positive for memory loss. Negative for depression, suicidal ideas, hallucinations and substance abuse. The patient is nervous/anxious and has insomnia.     Blood pressure 99/65, pulse 98, temperature 98.6 F (37 C), temperature source Oral, resp. rate 20, height 5' 1"  (1.549 m), weight 58.968 kg (130 lb), SpO2 100 %.Body mass index is 24.58 kg/(m^2).  General Appearance: Disheveled  Eye Sport and exercise psychologist::  Fair  Speech:  Normal Rate  Volume:  Normal  Mood:  Euthymic  Affect:  Constricted  Thought Process:  Disorganized  Orientation:  Full (Time,  Place, and Person)  Thought Content:  Paranoid Ideation  Suicidal Thoughts:  No  Homicidal Thoughts:  No  Memory:  Immediate;   Fair Recent;   Fair Remote;   Fair  Judgement:  Impaired  Insight:  Shallow  Psychomotor Activity:  Normal  Concentration:  Fair  Recall:  [place]  Language: Fair  Akathisia:  No  Handed:  Right  AIMS (if indicated):     Assets:  Communication Skills Desire for Improvement Financial Resources/Insurance Housing Social Support  ADL's:  Intact  Cognition: WNL  Sleep:      Treatment Plan Summary: Daily contact with patient to assess and evaluate symptoms and progress in treatment, Medication management and Plan Patient was not able to behave in a normal way on going home. Has minimal support at home. Even though she is not currently threatening self-harm her behavior was bizarre and paranoid and continues to be agitated intermittently in the emergency room. Does not appear safe to take care of herself at home. I suggest we up pulled the commitment. Continue current medicine including her nighttime Seroquel. Patient needs to be admitted to a  psychiatric unit. Because her stepbrother is currently an inpatient we cannot accept her here. Case reviewed with TTS and we will refer her out to providers at other hospitals in the system. Continue daily evaluation.  Disposition: Recommend psychiatric Inpatient admission when medically cleared. Supportive therapy provided about ongoing stressors.  Alethia Berthold, MD 10/25/2015 3:46 PM

## 2015-10-25 NOTE — ED Notes (Signed)
Report from matt, rn.  

## 2015-10-25 NOTE — BH Assessment (Signed)
Referral information for Psychiatric Hospitalization Treatment have been faxed to;    [phone]),   [phone]),   [facility]([phone]),   [facility]([phone]),   [facility] [phone]),   [facility] [phone]).

## 2015-10-25 NOTE — ED Notes (Signed)
Sandwich tray provided

## 2015-10-26 DIAGNOSIS — F259 Schizoaffective disorder, unspecified: Secondary | ICD-10-CM | POA: Diagnosis not present

## 2015-10-26 NOTE — ED Provider Notes (Signed)
-----------------------------------------   8:44 AM on 10/26/2015 -----------------------------------------   Blood pressure 84/58, pulse 105, temperature 98.1 F (36.7 C), temperature source Oral, resp. rate 18, height 5\' 1"  (1.549 m), weight 130 lb (58.968 kg), SpO2 100 %.  The patient had no acute events since last update.  Calm and cooperative at this time.  Patient has been accepted for transfer [facility] with Dr. Betti Cruzeddy. She remains medically stable. Appears to be mildly dehydrated, but normal mental status and no orthostatic symptoms.   Sharman CheekPhillip Everline Mahaffy, MD 10/26/15 [phone]

## 2015-10-26 NOTE — ED Notes (Signed)
Pt approached RN in hallway. Pt stated she need pain medication because her right arm is out of the socket. Pt asking for her oxycodone. Pt did not understand why she was brought to the hospital. "I only went to my neighbor's house to use the phone to call my dad."  Pt has disorganized thoughts, delusional thinking. Pt stated she will go home today as soon as her dad comes to pick her up. Pt does not feel she has any psychiatric issues and feels there is no need for her to be hospitalized. Maintained on 15 minute checks and observation by security camera for safety.

## 2015-10-26 NOTE — ED Notes (Signed)
Pt comforted by Careers adviserN and officer. Pt voluntarily left with officer. [facility]. Belongings given to [employer].

## 2015-10-26 NOTE — BH Specialist Note (Signed)
[name].  Ms. Adrienne Pottereague has been accepted. She can arrive after 9:00 am. Dr. Betti Cruzeddy is the accepting physician.  Call report to [phone]. [facility] [address] [facility]

## 2015-10-26 NOTE — ED Provider Notes (Signed)
-----------------------------------------   7:56 AM on 10/26/2015 -----------------------------------------   Blood pressure 84/58, pulse 105, temperature 98.1 F (36.7 C), temperature source Oral, resp. rate 18, height 5\' 1"  (1.549 m), weight 130 lb (58.968 kg), SpO2 100 %.  The patient had no acute events since last update.  Calm and cooperative at this time.  Disposition is pending per Psychiatry/Behavioral Medicine team recommendations.     Irean HongJade J Niobe Dick, MD 10/26/15 [phone]

## 2015-10-26 NOTE — ED Notes (Signed)
Trans[facility]. Pt crying and does not understand why she has to leave. Pt stated "Ill be good. Ill stay in my room and watch TV. I don't wanna leave. I want to go downstairs with my brother." Pt would not put on her clothes prior to transport. Belongings given to officer. Pt did walked to [place] willingly with another RN to wait for officer. Maintained on 15 minute checks and observation by security camera for safety.

## 2015-10-26 NOTE — ED Notes (Signed)
Patient asleep in room. No noted distress or abnormal behavior. Will continue 15 minute checks and observation by security cameras for safety. 

## 2015-10-26 NOTE — ED Notes (Signed)
Suspect dehydration. Writer provided fluids and encouraged continued hydration.

## 2015-10-26 NOTE — ED Notes (Addendum)
Pt is alert and oriented this evening watching TV in dayroom. Pt mood is anxious and she needs frequent redirection, but is polite and cooperative with staff. Pt denies SI/HI and AVH at this time. Writer provided food and drink and 15 minute checks are ongoing for safety.

## 2016-01-23 ENCOUNTER — Encounter: Payer: Self-pay | Admitting: Emergency Medicine

## 2016-01-23 ENCOUNTER — Emergency Department: Payer: Medicare Other

## 2016-01-23 ENCOUNTER — Emergency Department
Admission: EM | Admit: 2016-01-23 | Discharge: 2016-01-23 | Disposition: A | Discharge status: Home / Self Care | Payer: Medicare Other | Attending: Emergency Medicine | Admitting: Emergency Medicine

## 2016-01-23 DIAGNOSIS — R339 Retention of urine, unspecified: Secondary | ICD-10-CM

## 2016-01-23 DIAGNOSIS — N83201 Unspecified ovarian cyst, right side: Secondary | ICD-10-CM | POA: Diagnosis not present

## 2016-01-23 DIAGNOSIS — R58 Hemorrhage, not elsewhere classified: Secondary | ICD-10-CM

## 2016-01-23 DIAGNOSIS — K5900 Constipation, unspecified: Secondary | ICD-10-CM | POA: Diagnosis not present

## 2016-01-23 DIAGNOSIS — N939 Abnormal uterine and vaginal bleeding, unspecified: Secondary | ICD-10-CM | POA: Diagnosis not present

## 2016-01-23 DIAGNOSIS — F17210 Nicotine dependence, cigarettes, uncomplicated: Secondary | ICD-10-CM | POA: Diagnosis not present

## 2016-01-23 LAB — URINALYSIS COMPLETE WITH MICROSCOPIC (ARMC ONLY)
BILIRUBIN URINE: NEGATIVE
Bacteria, UA: NONE SEEN
GLUCOSE, UA: NEGATIVE mg/dL
Hgb urine dipstick: NEGATIVE
KETONES UR: NEGATIVE mg/dL
Leukocytes, UA: NEGATIVE
NITRITE: NEGATIVE
Protein, ur: 30 mg/dL — AB
RBC / HPF: NONE SEEN RBC/hpf (ref 0–5)
SPECIFIC GRAVITY, URINE: 1.021 (ref 1.005–1.030)
pH: 6 (ref 5.0–8.0)

## 2016-01-23 LAB — BASIC METABOLIC PANEL
ANION GAP: 6 (ref 5–15)
BUN: 10 mg/dL (ref 6–20)
CHLORIDE: 111 mmol/L (ref 101–111)
CO2: 24 mmol/L (ref 22–32)
Calcium: 8.2 mg/dL — ABNORMAL LOW (ref 8.9–10.3)
Creatinine, Ser: 0.98 mg/dL (ref 0.44–1.00)
GFR calc Af Amer: >60 mL/min (ref >60)
GLUCOSE: 138 mg/dL — AB (ref 65–99)
POTASSIUM: 3.5 mmol/L (ref 3.5–5.1)
Sodium: 141 mmol/L (ref 135–145)

## 2016-01-23 LAB — CBC
HEMATOCRIT: 37.4 % (ref 35.0–47.0)
HEMOGLOBIN: 13 g/dL (ref 12.0–16.0)
MCH: 30.3 pg (ref 26.0–34.0)
MCHC: 34.8 g/dL (ref 32.0–36.0)
MCV: 87.1 fL (ref 80.0–100.0)
Platelets: 211 10*3/uL (ref 150–440)
RBC: 4.29 MIL/uL (ref 3.80–5.20)
RDW: 13.5 % (ref 11.5–14.5)
WBC: 5.1 10*3/uL (ref 3.6–11.0)

## 2016-01-23 LAB — POC URINE PREG, ED: Preg Test, Ur: NEGATIVE

## 2016-01-23 MED ORDER — NICOTINE 21 MG/24HR TD PT24
21.0000 mg | MEDICATED_PATCH | Freq: Once | TRANSDERMAL | Status: DC
  Administered 2016-01-23: 21 mg via TRANSDERMAL
  Filled 2016-01-23: qty 1

## 2016-01-23 MED ORDER — LACTULOSE 10 GM/15ML PO SOLN: 20.0000 g | Freq: Every day | ORAL | 0 refills | Status: DC | PRN

## 2016-01-23 MED ORDER — ACETAMINOPHEN 500 MG PO TABS
1000.0000 mg | ORAL_TABLET | Freq: Once | ORAL | Status: AC
  Administered 2016-01-23: 1000 mg via ORAL
  Filled 2016-01-23: qty 2

## 2016-01-23 MED ORDER — SODIUM CHLORIDE 0.9 % IV BOLUS (SEPSIS)
1000.0000 mL | Freq: Once | INTRAVENOUS | Status: AC
  Administered 2016-01-23: 1000 mL via INTRAVENOUS

## 2016-01-23 NOTE — ED Notes (Signed)
Pt verbalized understanding of discharge papers and catheter care. Pt attempted to call cab but told without a voucher she would not be picked up. Pt placed in wheelchair and taken to lobby to call family/friend for a ride. NAD at this time.

## 2016-01-23 NOTE — ED Triage Notes (Signed)
Pt to rm 19 via ems from home.  EMS report pt hx urinary retention x 4 days and today saw blood clots in bath, possibly from urethra.  Pt reports of lower abd pain and bloating.  Pt NAD at this time, respirations equal and unlabored, skin warm and dry.

## 2016-01-23 NOTE — ED Notes (Signed)
While inserting catheter, bleeding noted from pt's vagina, not urethra, urine return was clear, no obvious blood noted, MD informed.

## 2016-01-23 NOTE — ED Notes (Signed)
Pt seen attempting to get dressed stating MD stated she could go home, Dr. Dolores Frame consulted and states she hasn't seen pt yet.  Pt re-oriented to situation and verbalizes understanding of needing to wait for the doctor.

## 2016-01-23 NOTE — ED Notes (Signed)
Attempted to discharge pt for the 2nd time. Pt stated she did not have a ride. Pt later walked down to the nurses station and asked for a phone to call a cab.

## 2016-01-23 NOTE — Discharge Instructions (Signed)
1. Take laxative as needed for bowel movements (lactulose). 2. Keep Foley catheter in place until seen by the urologist. 3. Return to the ER for worsening symptoms, persistent vomiting, difficulty breathing or other concerns.

## 2016-01-23 NOTE — ED Provider Notes (Signed)
[facility] Emergency Department Provider Note   ____________________________________________   First MD Initiated Contact with Patient 01/23/16 [phone]     (approximate)  I have reviewed the triage vital signs and the nursing notes.   HISTORY  Chief Complaint Vaginal Bleeding and Urinary Retention  Limited by somnolence  HPI LIONEL EYERMAN is a 35 y.o. female who presents to the EDfrom home via EMS with a chief complaint of urinary retention and bleeding. Patient states she "has not peed" in 4 days. Tonight she saw blood clots while bathing. States she has not had her period in 8 months, nor is she on any form of birth control. Also states she has not been evaluated by GYN for this. 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 States she has not had a BM in 5 days which is unusual for her. Also admits she is a patient of the pain clinic and is taking fentanyl as well as Percocets. Denies associated fever, chills, chest pain, shortness of breath, nausea, vomiting, diarrhea. Nothing makes her symptoms better or worse.   Past Medical History:  Diagnosis Date  . Degeneration of lumbar intervertebral disc   . Seizures (HCC)   . Shoulder pain, right     Patient Active Problem List   Diagnosis Date Noted  . Shoulder pain, right 10/25/2015  . Schizoaffective disorder (HCC) 10/22/2015  . Opiate abuse, episodic 10/22/2015    Past Surgical History:  Procedure Laterality Date  . CESAREAN SECTION     X2    Prior to Admission medications   Medication Sig Start Date End Date Taking? Authorizing Provider  baclofen (LIORESAL) 10 MG tablet Take 10 mg by mouth 3 (three) times daily.     Historical Provider, MD  clonazePAM (KLONOPIN) 2 MG tablet Take 1 tablet (2 mg total) by mouth 2 (two) times daily. 10/22/15   Audery Amel, MD  levETIRAcetam (KEPPRA) 1000 MG tablet Take 1 tablet (1,000 mg total) by mouth 2 (two) times daily.  10/23/15   Sharman Cheek, MD  meloxicam (MOBIC) 15 MG tablet Take 15 mg by mouth daily. with food 09/02/15   Historical Provider, MD  oxyCODONE (OXY IR/ROXICODONE) 5 MG immediate release tablet Take 5-10 mg by mouth See admin instructions. Take 1 to 2 tablets by mouth every 4 to 6 hours. 10/12/15   Historical Provider, MD  OXYCONTIN 10 MG 12 hr tablet Take 10 mg by mouth 2 (two) times daily. 10/12/15   Historical Provider, MD  QUEtiapine (SEROQUEL XR) 300 MG 24 hr tablet Take 1 tablet (300 mg total) by mouth at bedtime. 10/22/15   Audery Amel, MD    Allergies Latex; Morphine; Prednisone; Buprenorphine hcl-naloxone hcl; Duloxetine; Ketorolac tromethamine; Tramadol; Poison ivy extract [extract of poison ivy]; Poison oak extract [extract of poison oak]; and Sumac  History reviewed. No pertinent family history.  Social History Social History  Substance Use Topics  . Smoking status: Current Every Day Smoker    Types: Cigarettes  . Smokeless tobacco: Never Used  . Alcohol use No    Review of Systems  Constitutional: No fever/chills. Eyes: No visual changes. ENT: No sore throat. Cardiovascular: Denies chest pain. Respiratory: Denies shortness of breath. Gastrointestinal: No abdominal pain.  No nausea, no vomiting.  No diarrhea.  No constipation. Genitourinary: Positive for urinary retention. Positive for bleeding. Negative for dysuria. Musculoskeletal: Negative for back pain. Skin: Negative for rash. Neurological: Negative for headaches, focal  weakness or numbness.  10-point ROS otherwise negative.  ____________________________________________   PHYSICAL EXAM:  VITAL SIGNS: ED Triage Vitals  Enc Vitals Group     BP 01/23/16 0053 (!) 88/58     Pulse Rate 01/23/16 0053 94     Resp 01/23/16 0053 20     Temp 01/23/16 0053 99.1 F (37.3 C)     Temp Source 01/23/16 0053 Oral     SpO2 01/23/16 0053 96 %     Weight 01/23/16 0053 120 lb (54.4 kg)     Height 01/23/16 0053   (1.549 m)     Head Circumference --      Peak Flow --      Pain Score 01/23/16 0054 8     Pain Loc --      Pain Edu? --      Excl. in GC? --     Constitutional: Somnolence, appears under the influence of opiates. Awakens for history and physical exam. Well appearing and in no acute distress. Eyes: Conjunctivae are normal. PERRL. EOMI. Head: Atraumatic. Nose: No congestion/rhinnorhea. Mouth/Throat: Mucous membranes are moist.  Oropharynx non-erythematous. Neck: No stridor.   Cardiovascular: Normal rate, regular rhythm. Grossly normal heart sounds.  Good peripheral circulation. Respiratory: Normal respiratory effort.  No retractions. Lungs CTAB. Gastrointestinal: Soft and nontender. No distention. No abdominal bruits. No CVA tenderness. Musculoskeletal: No lower extremity tenderness nor edema.  No joint effusions. Neurologic:  Normal speech and language. No gross focal neurologic deficits are appreciated.  Skin:  Skin is warm, dry and intact. No rash noted. Psychiatric: Mood and affect are normal. Speech and behavior are normal.  ____________________________________________   LABS (all labs ordered are listed, but only abnormal results are displayed)  Labs Reviewed  URINALYSIS COMPLETEWITH MICROSCOPIC ([facility] ONLY) - Abnormal; Notable for the following:       Result Value   Color, Urine YELLOW (*)    APPearance CLEAR (*)    Protein, ur 30 (*)    Squamous Epithelial / LPF 0-5 (*)    All other components within normal limits  BASIC METABOLIC PANEL - Abnormal; Notable for the following:    Glucose, Bld 138 (*)    Calcium 8.2 (*)    All other components within normal limits  CBC  POC URINE PREG, ED   ____________________________________________  EKG  None ____________________________________________  RADIOLOGY  KUB (viewed by me, interpreted per Dr. Gwenyth Bender): Constipation. No bowel obstruction.  Pelvis US interpreted per Dr. Gwenyth Bender: Heterogeneous uterus and a 2  cm right ovarian dominant follicle/ cyst. Doppler detected flow is to both ovaries. ____________________________________________   PROCEDURES  Procedure(s) performed:   Pelvic exam: External exam WNL without rashes, lesions or vesicles. Speculum exam reveals dark, old vaginal blood. Bimanual exam WNL.  Procedures  Critical Care performed: No  ____________________________________________   INITIAL IMPRESSION / ASSESSMENT AND PLAN / ED COURSE  Pertinent labs & imaging results that were available during my care of the patient were reviewed by me and considered in my medical decision making (see chart for details).  35 year old female on opiate pain medications who presents with urinary retention likely secondary to constipation. Source of bleeding is vaginal; patient reports no menstrual period for the past 8 months. 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 Will proceed with pelvic ultrasound to evaluate etiology of vaginal bleeding.  Clinical Course  Comment By Time  Updated patient of ultrasound results. Will write prescription for Lactulose as needed for bowel movements. Will discharge home with  Foley catheter in place and referred for urology follow-up. Strict return precautions given. Patient verbalizes understanding and agrees with plan of care. Irean Hong, MD 07/29 [phone]     ____________________________________________   FINAL CLINICAL IMPRESSION(S) / ED DIAGNOSES  Final diagnoses:  Bleeding  Urinary retention  Constipation, unspecified constipation type  Cyst of right ovary      NEW MEDICATIONS STARTED DURING THIS VISIT:  New Prescriptions   No medications on file     Note:  This document was prepared using Dragon voice recognition software and may include unintentional dictation errors.    Irean Hong, MD 01/23/16 [phone]

## 2016-01-25 ENCOUNTER — Encounter: Payer: Self-pay | Admitting: *Deleted

## 2016-01-25 ENCOUNTER — Ambulatory Visit
Admission: EM | Admit: 2016-01-25 | Discharge: 2016-01-25 | Disposition: A | Discharge status: Home / Self Care | Payer: Medicare Other | Attending: Family Medicine | Admitting: Family Medicine

## 2016-01-25 DIAGNOSIS — S20229A Contusion of unspecified back wall of thorax, initial encounter: Secondary | ICD-10-CM | POA: Diagnosis not present

## 2016-01-25 MED ORDER — OXYCODONE HCL ER 10 MG PO T12A: 10.0000 mg | EXTENDED_RELEASE_TABLET | Freq: Two times a day (BID) | ORAL | 0 refills | Status: DC

## 2016-01-25 NOTE — ED Provider Notes (Signed)
[facility]    CSN: [id number] Arrival date & time: 01/25/16  1200  First Provider Contact:  First MD Initiated Contact with Patient 01/25/16 1310        History   Chief Complaint Chief Complaint  Patient presents with  . Back Pain  . Urinary Retention    HPI [name] is a 35 y.o. female.   The history is provided by the patient.  Back Pain  Patient had a urinary catheter placed 3 days ago at [facility] for urinary retention. Catheter is presently still in place and is causing pain.  Patient fell 1 day ago in the shower injuring her back. Bruising is visible mid back. Patient reports that she is going to pain management and is presently out of all her medications.  Past Medical History:  Diagnosis Date  . Degeneration of lumbar intervertebral disc   . Seizures (HCC)   . Shoulder pain, right     Patient Active Problem List   Diagnosis Date Noted  . Shoulder pain, right 10/25/2015  . Schizoaffective disorder (HCC) 10/22/2015  . Opiate abuse, episodic 10/22/2015    Past Surgical History:  Procedure Laterality Date  . CESAREAN SECTION     X2    OB History    No data available       Home Medications    Prior to Admission medications   Medication Sig Start Date End Date Taking? Authorizing Provider  baclofen (LIORESAL) 10 MG tablet Take 10 mg by mouth 3 (three) times daily.    Yes Historical Provider, MD  clonazePAM (KLONOPIN) 2 MG tablet Take 1 tablet (2 mg total) by mouth 2 (two) times daily. 10/22/15  Yes Audery Amel, MD  lamoTRIgine (LAMICTAL) 25 MG tablet Take 25 mg by mouth daily.   Yes Historical Provider, MD  meloxicam (MOBIC) 15 MG tablet Take 15 mg by mouth daily. with food 09/02/15  Yes Historical Provider, MD  QUEtiapine (SEROQUEL XR) 300 MG 24 hr tablet Take 1 tablet (300 mg total) by mouth at bedtime. 10/22/15  Yes Audery Amel, MD  Rivaroxaban (XARELTO) 15 MG TABS tablet Take 15 mg by mouth 2 (two) times daily with a meal.   Yes  Historical Provider, MD  lactulose (CHRONULAC) 10 GM/15ML solution Take 30 mLs (20 g total) by mouth daily as needed for mild constipation. 01/23/16   Irean Hong, MD  levETIRAcetam (KEPPRA) 1000 MG tablet Take 1 tablet (1,000 mg total) by mouth 2 (two) times daily. 10/23/15   Sharman Cheek, MD  oxyCODONE (OXYCONTIN) 10 mg 12 hr tablet Take 1 tablet (10 mg total) by mouth every 12 (twelve) hours. 01/25/16   Payton Mccallum, MD    Family History Family History  Problem Relation Age of Onset  . Cancer Mother     Social History Social History  Substance Use Topics  . Smoking status: Current Every Day Smoker    Types: Cigarettes  . Smokeless tobacco: Never Used  . Alcohol use No     Allergies   Latex; Morphine; Prednisone; Buprenorphine hcl-naloxone hcl; Duloxetine; Ketorolac tromethamine; Tramadol; Poison ivy extract [extract of poison ivy]; Poison oak extract [extract of poison oak]; and Sumac   Review of Systems Review of Systems  Musculoskeletal: Positive for back pain.     Physical Exam Triage Vital Signs ED Triage Vitals  Enc Vitals Group     BP 01/25/16 1225 102/77     Pulse Rate 01/25/16 1225 (!) 119  Resp 01/25/16 1225 20     Temp 01/25/16 1225 98.2 F (36.8 C)     Temp Source 01/25/16 1225 Oral     SpO2 01/25/16 1225 100 %     Weight 01/25/16 1225 128 lb (58.1 kg)     Height 01/25/16 1225 5\' 1"  (1.549 m)     Head Circumference --      Peak Flow --      Pain Score 01/25/16 1244 10     Pain Loc --      Pain Edu? --      Excl. in GC? --    No data found.   Updated Vital Signs BP 102/77 (BP Location: Left Arm)   Pulse (!) 119   Temp 98.2 F (36.8 C) (Oral)   Resp 20   Ht 5\' 1"  (1.549 m)   Wt 128 lb (58.1 kg)   LMP 05/28/2015 Comment: No recent period, last one approx 8 mo ago  SpO2 100%   BMI 24.19 kg/m   Visual Acuity Right Eye Distance:   Left Eye Distance:   Bilateral Distance:    Right Eye Near:   Left Eye Near:    Bilateral Near:      Physical Exam  Constitutional: She is oriented to person, place, and time. She appears well-developed and well-nourished. No distress.  HENT:  Head: Normocephalic.  Right Ear: Tympanic membrane, external ear and ear canal normal.  Left Ear: Tympanic membrane, external ear and ear canal normal.  Nose: Nose normal.  Mouth/Throat: Oropharynx is clear and moist and mucous membranes are normal.  Eyes: Conjunctivae and EOM are normal. Pupils are equal, round, and reactive to light. Right eye exhibits no discharge. Left eye exhibits no discharge. No scleral icterus.  Neck: Normal range of motion. Neck supple. No JVD present. No tracheal deviation present. No thyromegaly present.  Cardiovascular: Normal rate, regular rhythm, normal heart sounds and intact distal pulses.   No murmur heard. Pulmonary/Chest: Effort normal and breath sounds normal. No stridor. No respiratory distress. She has no wheezes. She has no rales. She exhibits no tenderness.  Abdominal: Soft. Bowel sounds are normal. She exhibits no distension and no mass. There is no tenderness. There is no rebound and no guarding.  Musculoskeletal: She exhibits no edema or tenderness.  Lymphadenopathy:    She has no cervical adenopathy.  Neurological: She is alert and oriented to person, place, and time. She has normal reflexes.  Skin: Skin is warm and dry. No rash noted. She is not diaphoretic. No erythema. No pallor.  Vitals reviewed.    UC Treatments / Results  Labs (all labs ordered are listed, but only abnormal results are displayed) Labs Reviewed - No data to display  EKG  EKG Interpretation None       Radiology No results found.  Procedures Procedures (including critical care time)  Medications Ordered in UC Medications - No data to display   Initial Impression / Assessment and Plan / UC Course  I have reviewed the triage vital signs and the nursing notes.  Pertinent labs & imaging results that were available  during my care of the patient were reviewed by me and considered in my medical decision making (see chart for details).  Clinical Course      Final Clinical Impressions(s) / UC Diagnoses   Final diagnoses:  Back contusion, unspecified laterality, initial encounter    New Prescriptions Discharge Medication List as of 01/25/2016  1:14 PM     Discharge  Medication List as of 01/25/2016  1:14 PM     1. diagnosis reviewed with patient/parent/guardian/family 2. rx as per orders above; reviewed possible side effects, interactions, risks and benefits  3. Recommend supportive treatment with ice, rest 4. Follow-up prn if symptoms worsen or don't improve   Payton Mccallum, MD 02/09/16 1158

## 2016-01-25 NOTE — ED Triage Notes (Addendum)
Patient had a urinary catheter placed 3 days ago at [facility] for urinary retention. Catheter is presently still in place and is causing pain.  Patient fell 1 day ago in the shower injuring her back. Bruising is visible mid back. Patient reports that she is going to pain management and is presently out of all her medications.

## 2016-02-29 ENCOUNTER — Encounter: Payer: Self-pay | Admitting: Emergency Medicine

## 2016-02-29 ENCOUNTER — Inpatient Hospital Stay
Admission: EM | Admit: 2016-02-29 | Discharge: 2016-03-02 | DRG: 101 | Disposition: A | Discharge status: Home / Self Care | Payer: Medicare Other | Attending: Internal Medicine | Admitting: Internal Medicine

## 2016-02-29 ENCOUNTER — Emergency Department: Payer: Medicare Other

## 2016-02-29 DIAGNOSIS — G40909 Epilepsy, unspecified, not intractable, without status epilepticus: Secondary | ICD-10-CM | POA: Diagnosis not present

## 2016-02-29 DIAGNOSIS — D72829 Elevated white blood cell count, unspecified: Secondary | ICD-10-CM

## 2016-02-29 DIAGNOSIS — T424X6A Underdosing of benzodiazepines, initial encounter: Secondary | ICD-10-CM | POA: Diagnosis present

## 2016-02-29 DIAGNOSIS — E876 Hypokalemia: Secondary | ICD-10-CM

## 2016-02-29 DIAGNOSIS — Z046 Encounter for general psychiatric examination, requested by authority: Secondary | ICD-10-CM

## 2016-02-29 DIAGNOSIS — F19921 Other psychoactive substance use, unspecified with intoxication with delirium: Secondary | ICD-10-CM

## 2016-02-29 DIAGNOSIS — Z791 Long term (current) use of non-steroidal anti-inflammatories (NSAID): Secondary | ICD-10-CM

## 2016-02-29 DIAGNOSIS — F259 Schizoaffective disorder, unspecified: Secondary | ICD-10-CM | POA: Diagnosis present

## 2016-02-29 DIAGNOSIS — F17210 Nicotine dependence, cigarettes, uncomplicated: Secondary | ICD-10-CM | POA: Diagnosis present

## 2016-02-29 DIAGNOSIS — M5136 Other intervertebral disc degeneration, lumbar region: Secondary | ICD-10-CM | POA: Diagnosis present

## 2016-02-29 DIAGNOSIS — Z79899 Other long term (current) drug therapy: Secondary | ICD-10-CM

## 2016-02-29 DIAGNOSIS — Z7901 Long term (current) use of anticoagulants: Secondary | ICD-10-CM

## 2016-02-29 DIAGNOSIS — Z91138 Patient's unintentional underdosing of medication regimen for other reason: Secondary | ICD-10-CM

## 2016-02-29 DIAGNOSIS — R4182 Altered mental status, unspecified: Secondary | ICD-10-CM | POA: Diagnosis present

## 2016-02-29 DIAGNOSIS — G8929 Other chronic pain: Secondary | ICD-10-CM | POA: Diagnosis present

## 2016-02-29 DIAGNOSIS — Y92009 Unspecified place in unspecified non-institutional (private) residence as the place of occurrence of the external cause: Secondary | ICD-10-CM

## 2016-02-29 DIAGNOSIS — R569 Unspecified convulsions: Secondary | ICD-10-CM

## 2016-02-29 DIAGNOSIS — T50904A Poisoning by unspecified drugs, medicaments and biological substances, undetermined, initial encounter: Secondary | ICD-10-CM

## 2016-02-29 LAB — BASIC METABOLIC PANEL
ANION GAP: 18 — AB (ref 5–15)
BUN: 8 mg/dL (ref 6–20)
CALCIUM: 10 mg/dL (ref 8.9–10.3)
CO2: 17 mmol/L — ABNORMAL LOW (ref 22–32)
Chloride: 106 mmol/L (ref 101–111)
Creatinine, Ser: 0.8 mg/dL (ref 0.44–1.00)
Glucose, Bld: 134 mg/dL — ABNORMAL HIGH (ref 65–99)
Potassium: 3.3 mmol/L — ABNORMAL LOW (ref 3.5–5.1)
SODIUM: 141 mmol/L (ref 135–145)

## 2016-02-29 LAB — HCG, QUANTITATIVE, PREGNANCY: HCG, BETA CHAIN, QUANT, S: 1 m[IU]/mL (ref <5)

## 2016-02-29 LAB — CBC
HCT: 43.5 % (ref 35.0–47.0)
Hemoglobin: 14.4 g/dL (ref 12.0–16.0)
MCH: 30.2 pg (ref 26.0–34.0)
MCHC: 33 g/dL (ref 32.0–36.0)
MCV: 91.4 fL (ref 80.0–100.0)
Platelets: 318 10*3/uL (ref 150–440)
RBC: 4.76 MIL/uL (ref 3.80–5.20)
RDW: 13.4 % (ref 11.5–14.5)
WBC: 12.4 10*3/uL — AB (ref 3.6–11.0)

## 2016-02-29 LAB — ETHANOL

## 2016-02-29 LAB — ACETAMINOPHEN LEVEL: Acetaminophen (Tylenol), Serum: <10 ug/mL — ABNORMAL LOW (ref 10–30)

## 2016-02-29 LAB — GLUCOSE, CAPILLARY: GLUCOSE-CAPILLARY: 176 mg/dL — AB (ref 65–99)

## 2016-02-29 LAB — SALICYLATE LEVEL: Salicylate Lvl: <4 mg/dL (ref 2.8–30.0)

## 2016-02-29 MED ORDER — SODIUM CHLORIDE 0.9 % IV BOLUS (SEPSIS)
1000.0000 mL | Freq: Once | INTRAVENOUS | Status: AC
Start: 2016-02-29 — End: 2016-03-01
  Administered 2016-02-29: 1000 mL via INTRAVENOUS

## 2016-02-29 MED ORDER — LORAZEPAM 2 MG/ML IJ SOLN
INTRAMUSCULAR | Status: AC
  Filled 2016-02-29: qty 1

## 2016-02-29 MED ORDER — LORAZEPAM 2 MG/ML IJ SOLN
2.0000 mg | Freq: Once | INTRAMUSCULAR | Status: AC
  Administered 2016-02-29: 2 mg via INTRAVENOUS

## 2016-02-29 MED ORDER — NALOXONE HCL 2 MG/2ML IJ SOSY
PREFILLED_SYRINGE | INTRAMUSCULAR | Status: AC
  Filled 2016-02-29: qty 2

## 2016-02-29 MED ORDER — NALOXONE HCL 2 MG/2ML IJ SOSY
0.4000 mg | PREFILLED_SYRINGE | Freq: Once | INTRAMUSCULAR | Status: AC
  Administered 2016-02-29: 0.4 mg via INTRAVENOUS

## 2016-02-29 MED ORDER — HALOPERIDOL LACTATE 5 MG/ML IJ SOLN
5.0000 mg | Freq: Once | INTRAMUSCULAR | Status: AC
  Administered 2016-02-29: 5 mg via INTRAMUSCULAR
  Filled 2016-02-29: qty 1

## 2016-02-29 MED ORDER — HALOPERIDOL LACTATE 5 MG/ML IJ SOLN: 5.0000 mg | Freq: Once | INTRAMUSCULAR | Status: DC

## 2016-02-29 MED ORDER — LORAZEPAM 2 MG/ML IJ SOLN
2.0000 mg | Freq: Once | INTRAMUSCULAR | Status: AC
  Administered 2016-02-29: 2 mg via INTRAVENOUS
  Filled 2016-02-29: qty 1

## 2016-02-29 MED ORDER — NALOXONE HCL 2 MG/2ML IJ SOSY: 0.4000 mg | PREFILLED_SYRINGE | Freq: Once | INTRAMUSCULAR | Status: DC

## 2016-02-29 NOTE — ED Notes (Addendum)
Pt resting soundly on stretcher, breathing 16 x a minute. Rise and fall noted. Pt has not moved since last note.   Father states pt has been out of clonipine, but does not know how long, states pt told him today she was out. Pt has been taking medicine 10 years per father.

## 2016-02-29 NOTE — ED Notes (Signed)
MD states to set up for intubation. Patients family educated on purpose and benefit, verbalizes understanding.

## 2016-02-29 NOTE — ED Notes (Signed)
Patient kicking in bed. Legs over siderails. Patient putting fingers to mouth as if she is smoking. Reorientation re-attempted. MD notified.

## 2016-02-29 NOTE — ED Notes (Signed)
Intubation on hold at this time. Patient resting, even and non labored respirations noted. Easier to redirect at this time. Able to place patient back on monitoring leads.

## 2016-02-29 NOTE — Code Documentation (Signed)
Post narcan admin, pt alert, sat up to voice, various movements in bed. MD and resp at bedside

## 2016-02-29 NOTE — ED Notes (Signed)
Patient still thrashing in bed. Kicking staff. MD notified and aware.

## 2016-02-29 NOTE — ED Notes (Signed)
Patient resting more comfortably, even and non labored respirations noted. Still frequently attempts to get out of bed. Redirected. Unsuccessful. MD notified.

## 2016-02-29 NOTE — ED Notes (Signed)
Called lab checking on remainder of labs. They stated they received orders but that they have not started on the "add-on" labs yet. They stated they would begin running those tests now.

## 2016-02-29 NOTE — ED Provider Notes (Signed)
[facility] Emergency Department Provider Note ____________________________________________  First MD Initiated Contact with Patient 02/29/16 2140   (approximate)  I have reviewed the triage vital signs and the nursing notes.  HISTORY  Chief Complaint Seizures  EM caveat: Patient unresponsive on arrival  HPI Tiburcio BashKatherine P Lamadrid is a 35 y.o. female father reports that she had a brief "seizure" about one hour prior to arrival. He doesn't feel like she was not acting appropriately, seem lethargic, they brought her to the emergency room. At the time of arrival here he reports she had another "seizure" where she was shaking violently and remaining unresponsive.  Father reports that the patient does not take her seizure medicine as she is supposed to, has a history of seizures, and is also taking Percocet as her only known medication.Father reports that he believes the patient ran out of her Klonopin recently as well. Reports he's had seizures in the past when she had run out of this.   Past Medical History:  Diagnosis Date  . Degeneration of lumbar intervertebral disc   . Seizures (HCC)   . Shoulder pain, right     Patient Active Problem List   Diagnosis Date Noted  . Shoulder pain, right 10/25/2015  . Schizoaffective disorder (HCC) 10/22/2015  . Opiate abuse, episodic 10/22/2015    Past Surgical History:  Procedure Laterality Date  . CESAREAN SECTION     X2    Prior to Admission medications   Medication Sig Start Date End Date Taking? Authorizing Provider  baclofen (LIORESAL) 10 MG tablet Take 10 mg by mouth 3 (three) times daily.     Historical Provider, MD  clonazePAM (KLONOPIN) 2 MG tablet Take 1 tablet (2 mg total) by mouth 2 (two) times daily. 10/22/15   Audery AmelJohn T Clapacs, MD  lactulose (CHRONULAC) 10 GM/15ML solution Take 30 mLs (20 g total) by mouth daily as needed for mild constipation. 01/23/16   Irean HongJade J Sung, MD  lamoTRIgine (LAMICTAL) 25 MG  tablet Take 25 mg by mouth daily.    Historical Provider, MD  levETIRAcetam (KEPPRA) 1000 MG tablet Take 1 tablet (1,000 mg total) by mouth 2 (two) times daily. 10/23/15   Sharman CheekPhillip Stafford, MD  meloxicam (MOBIC) 15 MG tablet Take 15 mg by mouth daily. with food 09/02/15   Historical Provider, MD  oxyCODONE (OXYCONTIN) 10 mg 12 hr tablet Take 1 tablet (10 mg total) by mouth every 12 (twelve) hours. 01/25/16   Payton Mccallumrlando Conty, MD  QUEtiapine (SEROQUEL XR) 300 MG 24 hr tablet Take 1 tablet (300 mg total) by mouth at bedtime. 10/22/15   Audery AmelJohn T Clapacs, MD  Rivaroxaban (XARELTO) 15 MG TABS tablet Take 15 mg by mouth 2 (two) times daily with a meal.    Historical Provider, MD    Allergies Latex; Morphine; Prednisone; Buprenorphine hcl-naloxone hcl; Duloxetine; Ketorolac tromethamine; Tramadol; Poison ivy extract [extract of poison ivy]; Poison oak extract [extract of poison oak]; and Sumac  Family History  Problem Relation Age of Onset  . Cancer Mother     Social History Social History  Substance Use Topics  . Smoking status: Current Every Day Smoker    Types: Cigarettes  . Smokeless tobacco: Never Used  . Alcohol use No    Review of Systems EM caveat ____________________________________________   PHYSICAL EXAM:  VITAL SIGNS: ED Triage Vitals  Enc Vitals Group     BP --      Pulse Rate 02/29/16 2118 (!) 129  Resp --      Temp 02/29/16 2118 98.2 F (36.8 C)     Temp Source 02/29/16 2118 Oral     SpO2 02/29/16 2118 98 %     Weight 02/29/16 2132 143 lb 11.2 oz (65.2 kg)     Height --      Head Circumference --      Peak Flow --      Pain Score --      Pain Loc --      Pain Edu? --      Excl. in GC? --    EM caveat Constitutional: Questionably seizing, appears cyanotic, shallow respirations.  Patient with a pulse. Initial attention turned toward stabilizing the patient in ABCs. Placed immediately on nonrebreather, monitoring, given Ativan for concerns of possible seizure  activity.   Eyes: Conjunctivae are normal. 6 left-sided gaze deviation. Pupils mid to slightly small  Head: Atraumatic. Nose: No congestion/rhinnorhea. Mouth/Throat: Mucous membranes are moist.  Oropharynx non-erythematous. Neck: No stridor.   Cardiovascular: Tachycardic rate, regular rhythm. Grossly normal heart sounds.  Good peripheral circulation. Respiratory: Clear, but slightly decreased respiratory effort, mild bradypnea with shallow inspirations  Gastrointestinal: Soft and nontender. No distention. Nongravid appearing  Musculoskeletal: No lower extremity tenderness nor edema.  No joint effusions. Neurologic: Initially unresponsive, not responding to pain. Then about 6-7 minutes patient did respond to pain slightly, thereafter given naloxone the patient sat up and became alert and then combative thereafter requiring further sedation. After the patient received approximately 10 mg of Ativan as well as Haldol, she has become compliant with care, cooperative with staff, is alert, oriented to self and location as well as her father who is with. Moving all extremities, states she feels tired but otherwise no complaint Skin:  Skin is warm, dry and intact. No rash noted.   ____________________________________________   LABS (all labs ordered are listed, but only abnormal results are displayed)  Labs Reviewed  GLUCOSE, CAPILLARY - Abnormal; Notable for the following:       Result Value   Glucose-Capillary 176 (*)    All other components within normal limits  BASIC METABOLIC PANEL - Abnormal; Notable for the following:    Potassium 3.3 (*)    CO2 17 (*)    Glucose, Bld 134 (*)    Anion gap 18 (*)    All other components within normal limits  CBC - Abnormal; Notable for the following:    WBC 12.4 (*)    All other components within normal limits  ACETAMINOPHEN LEVEL - Abnormal; Notable for the following:    Acetaminophen (Tylenol), Serum <10 (*)    All other components within normal  limits  HCG, QUANTITATIVE, PREGNANCY  ETHANOL  SALICYLATE LEVEL  CARBAMAZEPINE LEVEL, TOTAL  PHENYTOIN LEVEL, TOTAL  PHENOBARBITAL LEVEL  VALPROIC ACID LEVEL  LEVETIRACETAM LEVEL  URINALYSIS COMPLETEWITH MICROSCOPIC ([facility] ONLY)  URINE DRUG SCREEN, QUALITATIVE ([facility] ONLY)  CBG MONITORING, ED   ____________________________________________  EKG  Reviewed and interpreted by me 2259 Sinus tachycardia Heart rate 110 QRS 90 QTC 500 Sinus tachycardia, mild prolongation of QT interval. ____________________________________________  RADIOLOGY  Ct Head Wo Contrast  Result Date: 03/01/2016 CLINICAL DATA:  35 year old female with pain with seizure. EXAM: CT HEAD WITHOUT CONTRAST TECHNIQUE: Contiguous axial images were obtained from the base of the skull through the vertex without intravenous contrast. COMPARISON:  CT dated 10/23/2015 FINDINGS: The ventricles and the sulci are appropriate in size for the patient's age. There is no intracranial hemorrhage. No  midline shift or mass effect identified. The gray-white matter differentiation is preserved. The visualized paranasal sinuses and mastoid air cells are well aerated. The calvarium is intact. IMPRESSION: No acute intracranial pathology. Electronically Signed   By: Elgie Collard M.D.   On: 03/01/2016 00:08    ____________________________________________   PROCEDURES  Procedure(s) performed: None  Procedures  Critical Care performed: Yes, see critical care note(s)  CRITICAL CARE Performed by: Sharyn Creamer   Total critical care time: 60 minutes  Critical care time was exclusive of separately billable procedures and treating other patients.  Critical care was necessary to treat or prevent imminent or life-threatening deterioration.  Critical care was time spent personally by me on the following activities: development of treatment plan with patient and/or surrogate as well as nursing, discussions with consultants, evaluation of  patient's response to treatment, examination of patient, obtaining history from patient or surrogate, ordering and performing treatments and interventions, ordering and review of laboratory studies, ordering and review of radiographic studies, pulse oximetry and re-evaluation of patient's condition.  Patient presents with questionable active seizure, unresponsive, twitching and agonal respirations. Cyanotic on initial arrival, patient was taking shallow respirations, shaking activity and moaning. She had initial left-sided deviation concerning for possible seizure, was given Ativan and resolution of left-sided gaze, improvement and became flaccid in all extremities though noted to have mild bradypnea, patient also noted slight pinpoint pupils, given naloxone the patient woke up quickly thereafter however very agitated requiring significant re-dosing of benzodiazepines as well as a dose of Haldol to calm the patient to the point she is no longer kicking and attempting to injure other staff.  Father provides a history that the patient ran out of Klonopin recently, then may have had a withdrawal seizure. In addition the patient is evidently taking oxycodone, ____________________________________________   INITIAL IMPRESSION / ASSESSMENT AND PLAN / ED COURSE  Pertinent labs & imaging results that were available during my care of the patient were reviewed by me and considered in my medical decision making (see chart for details).    Clinical Course  Comment By Time  Patient is able to maintain eye contact, occasionally voice words and able to repeat back her name.Interim she continues to thrash, kicking at staff and actually checked it back in the chest. This point we'll have to give her 5mg  IM Haldol and additional 2 mg of Ativan as patient quite agitated.  Review of [facility] from [facility] indicates patient has a history of seizure disorder as well as prior psychosis. Sharyn Creamer, MD 09/04 2219    ----------------------------------------- 9:41 PM on 02/29/2016 -----------------------------------------  Within 1 minute of receiving naloxone, patient respirations increased, she opened her eyes and sat up spontaneously. Protecting airway well, sitting up, appears much improved.  ----------------------------------------- 12:22 AM on 03/01/2016 -----------------------------------------  Patient resting comfortably at this time, she is not following commands and compliant with care. She is not able describe what happened there is eating, and based on the history given I'm concerned about the patient's mental health and placed her under involuntary commitment for self-injurious or possible unintentional overdose as a suspected element of opioid overdose was also involved in today's presentation and possible withdrawal from benzodiazepine. The patient will be admitted to the hospital service with ongoing monitoring. Continue to monitor QT is, no ongoing QT prolonging medications after the patient received Haldol her EKG noted along QT  ____________________________________________   FINAL CLINICAL IMPRESSION(S) / ED DIAGNOSES  Final diagnoses:  Overdose, undetermined intent, initial encounter  Altered mental status, unspecified altered mental status type  Seizure disorder (HCC)  Involuntary commitment      NEW MEDICATIONS STARTED DURING THIS VISIT:  New Prescriptions   No medications on file     Note:  This document was prepared using Dragon voice recognition software and may include unintentional dictation errors.     Sharyn Creamer, MD 03/01/16 [phone]

## 2016-02-29 NOTE — ED Notes (Signed)
Attempted to reposition patient in bed. Unsuccessful. Patient lying sideways in bed with legs over side rails. Patient continues to kick side rails. Seizure pad in place.

## 2016-02-29 NOTE — ED Notes (Signed)
Pt laying diagonal on stretcher. No thrashing in a few minutes. Equal rise of fall and chest noted, no labored breathing noted.

## 2016-02-29 NOTE — ED Notes (Signed)
Patient wheeled back to room by Marga HootsShannin, Charity fundraiserN. Patient seizing in wheelchair, staff assisted patient to stretcher. MD at bedside. Patient suddenly stopped seizing. Unresponsive, shallow respirations noted.

## 2016-02-29 NOTE — ED Notes (Signed)
Patient has ripped off all monitoring leads. Per verbal order from MD it is okay to just have her on the BP cuff and zoll pad monitor.

## 2016-02-29 NOTE — ED Notes (Signed)
Pt presents to ED with friend from home with first seizure around 9pm ; lasted 3-4 sec

## 2016-02-29 NOTE — ED Notes (Signed)
Seizure pads placed on side rails for patient protection. Suction set up at bedside.

## 2016-02-29 NOTE — ED Notes (Signed)
Patient thrashing in bed. Speaking 1-4 word sentences. Incomprehensible speech.

## 2016-02-29 NOTE — ED Notes (Signed)
Patient still trashing in bed. Attempting to get out of bed. MD at bedside.

## 2016-03-01 ENCOUNTER — Encounter: Payer: Self-pay | Admitting: Internal Medicine

## 2016-03-01 DIAGNOSIS — D72829 Elevated white blood cell count, unspecified: Secondary | ICD-10-CM

## 2016-03-01 DIAGNOSIS — Z91138 Patient's unintentional underdosing of medication regimen for other reason: Secondary | ICD-10-CM | POA: Diagnosis not present

## 2016-03-01 DIAGNOSIS — E876 Hypokalemia: Secondary | ICD-10-CM | POA: Diagnosis present

## 2016-03-01 DIAGNOSIS — F17210 Nicotine dependence, cigarettes, uncomplicated: Secondary | ICD-10-CM | POA: Diagnosis present

## 2016-03-01 DIAGNOSIS — R569 Unspecified convulsions: Secondary | ICD-10-CM | POA: Diagnosis not present

## 2016-03-01 DIAGNOSIS — Z791 Long term (current) use of non-steroidal anti-inflammatories (NSAID): Secondary | ICD-10-CM | POA: Diagnosis not present

## 2016-03-01 DIAGNOSIS — G8929 Other chronic pain: Secondary | ICD-10-CM | POA: Diagnosis present

## 2016-03-01 DIAGNOSIS — Z79899 Other long term (current) drug therapy: Secondary | ICD-10-CM | POA: Diagnosis not present

## 2016-03-01 DIAGNOSIS — F19921 Other psychoactive substance use, unspecified with intoxication with delirium: Secondary | ICD-10-CM | POA: Diagnosis not present

## 2016-03-01 DIAGNOSIS — F259 Schizoaffective disorder, unspecified: Secondary | ICD-10-CM | POA: Diagnosis present

## 2016-03-01 DIAGNOSIS — Z7901 Long term (current) use of anticoagulants: Secondary | ICD-10-CM | POA: Diagnosis not present

## 2016-03-01 DIAGNOSIS — R4182 Altered mental status, unspecified: Secondary | ICD-10-CM | POA: Diagnosis present

## 2016-03-01 DIAGNOSIS — T424X6A Underdosing of benzodiazepines, initial encounter: Secondary | ICD-10-CM | POA: Diagnosis present

## 2016-03-01 DIAGNOSIS — Y92009 Unspecified place in unspecified non-institutional (private) residence as the place of occurrence of the external cause: Secondary | ICD-10-CM | POA: Diagnosis not present

## 2016-03-01 DIAGNOSIS — M5136 Other intervertebral disc degeneration, lumbar region: Secondary | ICD-10-CM | POA: Diagnosis present

## 2016-03-01 DIAGNOSIS — G40909 Epilepsy, unspecified, not intractable, without status epilepticus: Secondary | ICD-10-CM | POA: Diagnosis present

## 2016-03-01 LAB — PHENYTOIN LEVEL, TOTAL

## 2016-03-01 LAB — BASIC METABOLIC PANEL
ANION GAP: 4 — AB (ref 5–15)
BUN: 10 mg/dL (ref 6–20)
CALCIUM: 8.4 mg/dL — AB (ref 8.9–10.3)
CO2: 20 mmol/L — ABNORMAL LOW (ref 22–32)
Chloride: 113 mmol/L — ABNORMAL HIGH (ref 101–111)
Creatinine, Ser: 0.59 mg/dL (ref 0.44–1.00)
GLUCOSE: 103 mg/dL — AB (ref 65–99)
Potassium: 4.1 mmol/L (ref 3.5–5.1)
SODIUM: 137 mmol/L (ref 135–145)

## 2016-03-01 LAB — URINALYSIS COMPLETE WITH MICROSCOPIC (ARMC ONLY)
BILIRUBIN URINE: NEGATIVE
Glucose, UA: NEGATIVE mg/dL
HGB URINE DIPSTICK: NEGATIVE
KETONES UR: NEGATIVE mg/dL
LEUKOCYTES UA: NEGATIVE
NITRITE: NEGATIVE
PH: 6 (ref 5.0–8.0)
Protein, ur: NEGATIVE mg/dL
Specific Gravity, Urine: 1.011 (ref 1.005–1.030)

## 2016-03-01 LAB — CBC
HCT: 34.5 % — ABNORMAL LOW (ref 35.0–47.0)
HEMOGLOBIN: 12.1 g/dL (ref 12.0–16.0)
MCH: 30.6 pg (ref 26.0–34.0)
MCHC: 34.9 g/dL (ref 32.0–36.0)
MCV: 87.7 fL (ref 80.0–100.0)
Platelets: 264 10*3/uL (ref 150–440)
RBC: 3.94 MIL/uL (ref 3.80–5.20)
RDW: 13.4 % (ref 11.5–14.5)
WBC: 9.4 10*3/uL (ref 3.6–11.0)

## 2016-03-01 LAB — URINE DRUG SCREEN, QUALITATIVE (ARMC ONLY)
AMPHETAMINES, UR SCREEN: NOT DETECTED
BARBITURATES, UR SCREEN: NOT DETECTED
BENZODIAZEPINE, UR SCRN: POSITIVE — AB
Cannabinoid 50 Ng, Ur ~~LOC~~: NOT DETECTED
Cocaine Metabolite,Ur ~~LOC~~: NOT DETECTED
MDMA (Ecstasy)Ur Screen: NOT DETECTED
Methadone Scn, Ur: NOT DETECTED
OPIATE, UR SCREEN: POSITIVE — AB
PHENCYCLIDINE (PCP) UR S: NOT DETECTED
Tricyclic, Ur Screen: POSITIVE — AB

## 2016-03-01 LAB — CARBAMAZEPINE LEVEL, TOTAL

## 2016-03-01 LAB — MRSA PCR SCREENING: MRSA BY PCR: NEGATIVE

## 2016-03-01 LAB — AMMONIA: AMMONIA: 15 umol/L (ref 9–35)

## 2016-03-01 LAB — GLUCOSE, CAPILLARY: GLUCOSE-CAPILLARY: 134 mg/dL — AB (ref 65–99)

## 2016-03-01 LAB — VALPROIC ACID LEVEL

## 2016-03-01 LAB — PHENOBARBITAL LEVEL

## 2016-03-01 MED ORDER — SODIUM CHLORIDE 0.9 % IV SOLN
INTRAVENOUS | Status: DC
  Administered 2016-03-01 (×2): via INTRAVENOUS

## 2016-03-01 MED ORDER — NICOTINE 14 MG/24HR TD PT24
14.0000 mg | MEDICATED_PATCH | Freq: Every day | TRANSDERMAL | Status: DC
  Administered 2016-03-01 – 2016-03-02 (×2): 14 mg via TRANSDERMAL
  Filled 2016-03-01 (×2): qty 1

## 2016-03-01 MED ORDER — ONDANSETRON HCL 4 MG/2ML IJ SOLN: 4.0000 mg | Freq: Four times a day (QID) | INTRAMUSCULAR | Status: DC | PRN

## 2016-03-01 MED ORDER — LORAZEPAM 2 MG/ML IJ SOLN: 2.0000 mg | INTRAMUSCULAR | Status: DC | PRN

## 2016-03-01 MED ORDER — BACLOFEN 10 MG PO TABS
10.0000 mg | ORAL_TABLET | Freq: Three times a day (TID) | ORAL | Status: DC
  Administered 2016-03-01 – 2016-03-02 (×4): 10 mg via ORAL
  Filled 2016-03-01 (×4): qty 1

## 2016-03-01 MED ORDER — RIVAROXABAN 15 MG PO TABS
15.0000 mg | ORAL_TABLET | Freq: Two times a day (BID) | ORAL | Status: DC
  Administered 2016-03-01 – 2016-03-02 (×3): 15 mg via ORAL
  Filled 2016-03-01 (×3): qty 1

## 2016-03-01 MED ORDER — LAMOTRIGINE 25 MG PO TABS
25.0000 mg | ORAL_TABLET | Freq: Every day | ORAL | Status: DC
  Administered 2016-03-01: 25 mg via ORAL
  Filled 2016-03-01: qty 1

## 2016-03-01 MED ORDER — POTASSIUM CHLORIDE 10 MEQ/100ML IV SOLN
10.0000 meq | INTRAVENOUS | Status: AC
  Administered 2016-03-01 (×2): 10 meq via INTRAVENOUS
  Filled 2016-03-01 (×2): qty 100

## 2016-03-01 MED ORDER — CLONAZEPAM 1 MG PO TABS
2.0000 mg | ORAL_TABLET | Freq: Two times a day (BID) | ORAL | Status: DC
  Administered 2016-03-01 – 2016-03-02 (×4): 2 mg via ORAL
  Filled 2016-03-01 (×4): qty 2

## 2016-03-01 MED ORDER — ONDANSETRON HCL 4 MG PO TABS: 4.0000 mg | ORAL_TABLET | Freq: Four times a day (QID) | ORAL | Status: DC | PRN

## 2016-03-01 MED ORDER — ACETAMINOPHEN 650 MG RE SUPP: 650.0000 mg | Freq: Four times a day (QID) | RECTAL | Status: DC | PRN

## 2016-03-01 MED ORDER — NICOTINE 14 MG/24HR TD PT24: 14.0000 mg | MEDICATED_PATCH | Freq: Every day | TRANSDERMAL | 0 refills | Status: DC

## 2016-03-01 MED ORDER — LACTULOSE 10 GM/15ML PO SOLN: 20.0000 g | Freq: Every day | ORAL | Status: DC | PRN

## 2016-03-01 MED ORDER — LEVETIRACETAM 500 MG PO TABS
1000.0000 mg | ORAL_TABLET | Freq: Two times a day (BID) | ORAL | Status: DC
  Administered 2016-03-01 – 2016-03-02 (×4): 1000 mg via ORAL
  Filled 2016-03-01 (×4): qty 2

## 2016-03-01 MED ORDER — CLONAZEPAM 2 MG PO TABS: 2.0000 mg | ORAL_TABLET | Freq: Two times a day (BID) | ORAL | 0 refills | Status: DC

## 2016-03-01 MED ORDER — QUETIAPINE FUMARATE ER 300 MG PO TB24
300.0000 mg | ORAL_TABLET | Freq: Every day | ORAL | Status: DC
  Administered 2016-03-01: 300 mg via ORAL
  Filled 2016-03-01 (×2): qty 1

## 2016-03-01 MED ORDER — ACETAMINOPHEN 325 MG PO TABS
650.0000 mg | ORAL_TABLET | Freq: Four times a day (QID) | ORAL | Status: DC | PRN
  Administered 2016-03-01 – 2016-03-02 (×2): 650 mg via ORAL
  Filled 2016-03-01 (×2): qty 2

## 2016-03-01 MED ORDER — SODIUM CHLORIDE 0.9% FLUSH
3.0000 mL | Freq: Two times a day (BID) | INTRAVENOUS | Status: DC
  Administered 2016-03-01 (×2): 3 mL via INTRAVENOUS

## 2016-03-01 NOTE — Progress Notes (Signed)
[facility] [place] [facility]   PATIENT NAME: [name]    MR#:  [id number]  DATE OF BIRTH:  05/12/1981  SUBJECTIVE:  CHIEF COMPLAINT:   Chief Complaint  Patient presents with  . Seizures  The patient is a 35 year old Caucasian female with past medical history significant for history of seizure disorder, lower back pain, who presents to the hospital with seizure. It was described as shaking and being unresponsive with left eye deviation. Patient denied any misuse of her seizure medications, although admitted of running out of Klonopin. When evaluated in emergency room, she was noted to have pinpoint pupils, given IV Narcan, became agitated after which he required Ativan and Haldol. She was involuntarily committed in the emergency room . She denies any significant pain or discomfort  Review of Systems  Unable to perform ROS: Psychiatric disorder   patient is not willing to answer questions, withdrawn  VITAL SIGNS: Blood pressure 107/79, pulse 81, temperature 98.4 F (36.9 C), temperature source Oral, resp. rate 18, height 5\' 3"  (1.6 m), weight 61.9 kg (136 lb 7.4 oz), SpO2 99 %.  PHYSICAL EXAMINATION:   GENERAL:  35 y.o.-year-old patient lying in the bed with no acute distress.  EYES: Pupils equal, round, reactive to light and accommodation. No scleral icterus. Extraocular muscles intact.  HEENT: Head atraumatic, normocephalic. Oropharynx and nasopharynx clear.  NECK:  Supple, no jugular venous distention. No thyroid enlargement, no tenderness.  LUNGS: Normal breath sounds bilaterally, no wheezing, rales,rhonchi or crepitation. No use of accessory muscles of respiration.  CARDIOVASCULAR: S1, S2 normal. No murmurs, rubs, or gallops.  ABDOMEN: Soft, nontender, nondistended. Bowel sounds present. No organomegaly or mass.  EXTREMITIES: No pedal edema, cyanosis, or clubbing.  NEUROLOGIC: Cranial nerves II through XII are intact. Muscle strength 5/5 in  all extremities. Sensation intact. Gait not checked.  PSYCHIATRIC: The patient is alert and oriented x 3.  SKIN: No obvious rash, lesion, or ulcer.   ORDERS/RESULTS REVIEWED:   CBC  Recent Labs Lab 02/29/16 2129 03/01/16 0519  WBC 12.4* 9.4  HGB 14.4 12.1  HCT 43.5 34.5*  PLT 318 264  MCV 91.4 87.7  MCH 30.2 30.6  MCHC 33.0 34.9  RDW 13.4 13.4   ------------------------------------------------------------------------------------------------------------------  Chemistries   Recent Labs Lab 02/29/16 2129 03/01/16 0519  NA 141 137  K 3.3* 4.1  CL 106 113*  CO2 17* 20*  GLUCOSE 134* 103*  BUN 8 10  CREATININE 0.80 0.59  CALCIUM 10.0 8.4*   ------------------------------------------------------------------------------------------------------------------ estimated creatinine clearance is 83.5 mL/min (by C-G formula based on SCr of 0.8 mg/dL). ------------------------------------------------------------------------------------------------------------------ No results for input(s): TSH, T4TOTAL, T3FREE, THYROIDAB in the last 72 hours.  Invalid input(s): FREET3  Cardiac Enzymes No results for input(s): CKMB, TROPONINI, MYOGLOBIN in the last 168 hours.  Invalid input(s): CK ------------------------------------------------------------------------------------------------------------------ Invalid input(s): POCBNP ---------------------------------------------------------------------------------------------------------------  RADIOLOGY: Ct Head Wo Contrast  Result Date: 03/01/2016 CLINICAL DATA:  35 year old female with pain with seizure. EXAM: CT HEAD WITHOUT CONTRAST TECHNIQUE: Contiguous axial images were obtained from the base of the skull through the vertex without intravenous contrast. COMPARISON:  CT dated 10/23/2015 FINDINGS: The ventricles and the sulci are appropriate in size for the patient's age. There is no intracranial hemorrhage. No midline shift or mass effect  identified. The gray-white matter differentiation is preserved. The visualized paranasal sinuses and mastoid air cells are well aerated. The calvarium is intact. IMPRESSION: No acute intracranial pathology. Electronically Signed   By: Ceasar MonsArash  Radparvar M.D.  On: 03/01/2016 00:08    EKG:  Orders placed or performed during the hospital encounter of 02/29/16  . ED EKG  . ED EKG  . EKG 12-Lead  . EKG 12-Lead    ASSESSMENT AND PLAN:  Principal Problem:   Altered mental status Active Problems:   Seizure (HCC) #1. Seizure disorder, patient was seen by neurologist, recommended to continue outpatient medication doses including Klonopin, considering slow tapering #2. Altered mental status, confusion, unclear etiology, could be related to multiple drugs, seen on urine drug screen, including benzodiazepines, opiates, tricyclics, however, patient is on benzodiazepines as well as opiates at home. Her condition has improved, advancing the diet, possible discharge home if okay with psychiatrist. Ammonia level was normal #3. Hypokalemia, resolved #4. Leukocytosis, resolved #5. Tobacco abuse. Counseling, discussed this patient for 3-4 minutes, nicotine replacement therapy is going to be initiated   Management plans discussed with the patient, family and they are in agreement.   DRUG ALLERGIES:  Allergies  Allergen Reactions  . Latex Rash and Swelling    Other reaction(s): Unknown Skin turns red  . Morphine Other (See Comments) and Swelling    Patient states that her reaction is that her skin gets a bit red.   No urticaria or airway difficulty.    . Prednisone Other (See Comments)    Other reaction(s): Other (See Comments) Stayed up 30 days when pt. Was on prednisone insomnia  . Buprenorphine Hcl-Naloxone Hcl   . Duloxetine Other (See Comments)    Unknown  . Ketorolac Tromethamine   . Tramadol     Other reaction(s): Other (See Comments) Seizures  . Poison Ivy Extract [Extract Of Poison  Ivy] Rash  . Poison Oak Extract [Extract Of Poison Oak] Rash  . Sumac Rash    CODE STATUS:     Code Status Orders        Start     Ordered   03/01/16 0334  Full code  Continuous     03/01/16 0333    Code Status History    Date Active Date Inactive Code Status Order ID Comments User Context   This patient has a current code status but no historical code status.      TOTAL TIME TAKING CARE OF THIS PATIENT: 40 minutes.    Katharina Caper M.D on 03/01/2016 at 12:09 PM  Between 7am to 6pm - Pager - [phone]  After 6pm go to [email or web address] - password [email or web address] [facility]  [facility]  Office  [phone]  CC: Primary care physician; Estell Harpin, MD

## 2016-03-01 NOTE — Care Management (Signed)
Attempted to reach patient's father at cell number listed- disconnected and unable to leave message at listed home number. RNCM will need to follow up with patient's listed father Myles RosenthalJeffrey Brown when available. RNCM [phone]

## 2016-03-01 NOTE — H&P (Signed)
[facility] - [place] at [facility]   PATIENT NAME: [name]    MR#:  [id number]  DATE OF BIRTH:  28-Apr-1981  DATE OF ADMISSION:  02/29/2016  PRIMARY CARE PHYSICIAN: Estell Harpin, MD   REQUESTING/REFERRING PHYSICIAN:   CHIEF COMPLAINT:   Chief Complaint  Patient presents with  . Seizures    HISTORY OF PRESENT ILLNESS: [name]  is a 35 y.o. female with a known history of Seizure disorder degenerative disc disease presented to the emergency room for confusion and seizure. Patient was brought by her father to the emergency room. She had a seizure at home and also one episode of seizure after arrival to emergency room. Patient is on oral Keppra for seizure disorder at home. She ran out of Klonopin medication recently. Patient came to the emergency room she was confused and with decreased responsiveness. She was given IV Narcan and patient woke up and later on was agitated for which she was given IV Ativan. Patient's sleepy and lethargic when history was taken. Not much information could be obtained from the patient. She received 1 L of normal saline and fluid in the emergency room. No history of any head injury. Patient was involuntarily committed in the emergency room. Neurology consultation was done by ER physician and case was discussed with them. Hospitalist service was consulted for further care of the patient.  PAST MEDICAL HISTORY:   Past Medical History:  Diagnosis Date  . Degeneration of lumbar intervertebral disc   . Seizures (HCC)   . Shoulder pain, right     PAST SURGICAL HISTORY: Past Surgical History:  Procedure Laterality Date  . CESAREAN SECTION     X2    SOCIAL HISTORY:  Social History  Substance Use Topics  . Smoking status: Current Every Day Smoker    Types: Cigarettes  . Smokeless tobacco: Never Used  . Alcohol use No    FAMILY HISTORY:  Family History  Problem Relation Age of Onset  . Cancer Mother     DRUG  ALLERGIES:  Allergies  Allergen Reactions  . Latex Rash and Swelling    Other reaction(s): Unknown Skin turns red  . Morphine Other (See Comments) and Swelling    Patient states that her reaction is that her skin gets a bit red.   No urticaria or airway difficulty.    . Prednisone Other (See Comments)    Other reaction(s): Other (See Comments) Stayed up 30 days when pt. Was on prednisone insomnia  . Buprenorphine Hcl-Naloxone Hcl   . Duloxetine Other (See Comments)    Unknown  . Ketorolac Tromethamine   . Tramadol     Other reaction(s): Other (See Comments) Seizures  . Poison Ivy Extract [Extract Of Poison Ivy] Rash  . Poison Oak Extract [Extract Of Poison Oak] Rash  . Sumac Rash    REVIEW OF SYSTEMS:  Could not be obtained as patient is lethargic and sleepy.  MEDICATIONS AT HOME:  Prior to Admission medications   Medication Sig Start Date End Date Taking? Authorizing Provider  baclofen (LIORESAL) 10 MG tablet Take 10 mg by mouth 3 (three) times daily.    Yes Historical Provider, MD  clonazePAM (KLONOPIN) 2 MG tablet Take 1 tablet (2 mg total) by mouth 2 (two) times daily. 10/22/15  Yes Audery Amel, MD  lactulose (CHRONULAC) 10 GM/15ML solution Take 30 mLs (20 g total) by mouth daily as needed for mild constipation. 01/23/16  Yes Irean Hong, MD  meloxicam [facility])  15 MG tablet Take 15 mg by mouth daily. with food 09/02/15  Yes Historical Provider, MD  oxyCODONE (OXYCONTIN) 10 mg 12 hr tablet Take 1 tablet (10 mg total) by mouth every 12 (twelve) hours. 01/25/16  Yes Payton Mccallum, MD  QUEtiapine (SEROQUEL XR) 300 MG 24 hr tablet Take 1 tablet (300 mg total) by mouth at bedtime. 10/22/15  Yes Audery Amel, MD  Rivaroxaban (XARELTO) 15 MG TABS tablet Take 15 mg by mouth 2 (two) times daily with a meal.   Yes Historical Provider, MD  lamoTRIgine (LAMICTAL) 25 MG tablet Take 25 mg by mouth daily.    Historical Provider, MD  levETIRAcetam (KEPPRA) 1000 MG tablet Take 1 tablet (1,000  mg total) by mouth 2 (two) times daily. Patient not taking: Reported on 03/01/2016 10/23/15   Sharman Cheek, MD      PHYSICAL EXAMINATION:   VITAL SIGNS: Blood pressure 105/62, pulse 97, temperature 98.2 F (36.8 C), temperature source Oral, resp. rate 18, weight 65.2 kg (143 lb 11.2 oz), SpO2 99 %.  GENERAL:  35 y.o.-year-old patient lying in the bed responding to loud verbal commands and painful stimuli  EYES: Pupils equal, round, reactive to light and accommodation. No scleral icterus. Extraocular muscles intact.  HEENT: Head atraumatic, normocephalic. Oropharynx and nasopharynx clear.  NECK:  Supple, no jugular venous distention. No thyroid enlargement, no tenderness.  LUNGS: Normal breath sounds bilaterally, no wheezing, rales,rhonchi or crepitation. No use of accessory muscles of respiration.  CARDIOVASCULAR: S1, S2 normal. No murmurs, rubs, or gallops.  ABDOMEN: Soft, nontender, nondistended. Bowel sounds present. No organomegaly or mass.  EXTREMITIES: No pedal edema, cyanosis, or clubbing.  NEUROLOGIC: Arousable to painful stimuli. Not completely oriented to time,place and person Moves all extremities. Sensation intact. Gait not checked.  Complete exam was not possible as patient is lethargic and sedated. PSYCHIATRIC: could not be assessed SKIN: No obvious rash, lesion, or ulcer.   LABORATORY PANEL:   CBC  Recent Labs Lab 02/29/16 2129  WBC 12.4*  HGB 14.4  HCT 43.5  PLT 318  MCV 91.4  MCH 30.2  MCHC 33.0  RDW 13.4   ------------------------------------------------------------------------------------------------------------------  Chemistries   Recent Labs Lab 02/29/16 2129  NA 141  K 3.3*  CL 106  CO2 17*  GLUCOSE 134*  BUN 8  CREATININE 0.80  CALCIUM 10.0   ------------------------------------------------------------------------------------------------------------------ estimated creatinine clearance is 87.3 mL/min (by C-G formula based on SCr of 0.8  mg/dL). ------------------------------------------------------------------------------------------------------------------ No results for input(s): TSH, T4TOTAL, T3FREE, THYROIDAB in the last 72 hours.  Invalid input(s): FREET3   Coagulation profile No results for input(s): INR, PROTIME in the last 168 hours. ------------------------------------------------------------------------------------------------------------------- No results for input(s): DDIMER in the last 72 hours. -------------------------------------------------------------------------------------------------------------------  Cardiac Enzymes No results for input(s): CKMB, TROPONINI, MYOGLOBIN in the last 168 hours.  Invalid input(s): CK ------------------------------------------------------------------------------------------------------------------ Invalid input(s): POCBNP  ---------------------------------------------------------------------------------------------------------------  Urinalysis    Component Value Date/Time   COLORURINE YELLOW (A) 01/23/2016 0117   APPEARANCEUR CLEAR (A) 01/23/2016 0117   APPEARANCEUR HAZY 10/07/2014 1434   LABSPEC 1.021 01/23/2016 0117   LABSPEC >=1.030 10/07/2014 1434   PHURINE 6.0 01/23/2016 0117   GLUCOSEU NEGATIVE 01/23/2016 0117   GLUCOSEU NEGATIVE 10/07/2014 1434   HGBUR NEGATIVE 01/23/2016 0117   BILIRUBINUR NEGATIVE 01/23/2016 0117   BILIRUBINUR NEGATIVE 10/07/2014 1434   KETONESUR NEGATIVE 01/23/2016 0117   PROTEINUR 30 (A) 01/23/2016 0117   NITRITE NEGATIVE 01/23/2016 0117   LEUKOCYTESUR NEGATIVE 01/23/2016 0117   LEUKOCYTESUR NEGATIVE 10/07/2014 1434  RADIOLOGY: Ct Head Wo Contrast  Result Date: 03/01/2016 CLINICAL DATA:  35 year old female with pain with seizure. EXAM: CT HEAD WITHOUT CONTRAST TECHNIQUE: Contiguous axial images were obtained from the base of the skull through the vertex without intravenous contrast. COMPARISON:  CT dated 10/23/2015  FINDINGS: The ventricles and the sulci are appropriate in size for the patient's age. There is no intracranial hemorrhage. No midline shift or mass effect identified. The gray-white matter differentiation is preserved. The visualized paranasal sinuses and mastoid air cells are well aerated. The calvarium is intact. IMPRESSION: No acute intracranial pathology. Electronically Signed   By: Elgie CollardArash  Radparvar M.D.   On: 03/01/2016 00:08    EKG: Orders placed or performed during the hospital encounter of 02/29/16  . ED EKG  . ED EKG  . EKG 12-Lead  . EKG 12-Lead    IMPRESSION AND PLAN: 35 year old female patient with history of seizure disorder, degenerative disc disease presented to the emergency room with confusion and seizure. Patient involuntarily committed in the emergency room. Admitting diagnosis 1. Altered mental status 2. Breakthrough seizure 3. Noncompliance with medication 4. Hypokalemia Treatment plan Admit patient to stepdown unit Telemetry monitoring Resume Keppra for seizure disorder Patient on oral xarelto as outpatient. Will continue for anticoagulation. IV potassium replacement intravenously IV fluid hydration DVT prophylaxis continue oral Xarelto IV Ativan as needed for seizure. Neurology consultation for seizure disorder One-on-one observation for patient safety.  All the records are reviewed and case discussed with ED provider. Management plans discussed with the patient, family and they are in agreement.  CODE STATUS:FULL Code Status History    This patient does not have a recorded code status. Please follow your organizational policy for patients in this situation.       TOTAL TIME TAKING CARE OF THIS PATIENT: 50 minutes.    Ihor AustinPavan Pyreddy M.D on 03/01/2016 at 2:40 AM  Between 7am to 6pm - Pager - [phone]  After 6pm go to [email or web address] - password [email or web address] [facility]  [facility]  Office  [phone]  CC: Primary care physician; Estell HarpinVINES,DAIN,  MD

## 2016-03-01 NOTE — ED Notes (Signed)
[phone] nephew of father, Deanne CofferDaryl Brown. Dad provided this number in case he couldn't be reached by his numbers in emergency contact.

## 2016-03-01 NOTE — Consult Note (Signed)
Reason for Consult:Seizures Referring Physician: Winona LegatoVaickute  CC: Seizures and confusion  HPI: Adrienne BashKatherine P Jimenez is an 35 y.o. female with known history of seizures.  Currently with altered mental status and unable to provide any reliable history.  All history obtained from the chart.  Patient was brought by her father to the emergency room. She had a seizure at home and also one episode of seizure after arrival to emergency room.  Seizure in ED was described a shaking and being unresponsive with left eye deviation.  Patient is on oral Keppra and Lamictal for seizure disorder at home. She is also on Klonopin but ran out of Klonopin medication recently. When evaluated in the emergency room she was confused, with decreased responsiveness and pinpoint pupils. She was given IV Narcan and patient woke up but became extremely agitated for which she was given IV Ativan/Haldol. Patient was involuntarily committed in the emergency room.   Past Medical History:  Diagnosis Date  . Degeneration of lumbar intervertebral disc   . Seizures (HCC)   . Shoulder pain, right     Past Surgical History:  Procedure Laterality Date  . CESAREAN SECTION     X2    Family History  Problem Relation Age of Onset  . Cancer Mother     Social History:  reports that she has been smoking Cigarettes.  She has never used smokeless tobacco. She reports that she does not drink alcohol or use drugs.  Allergies  Allergen Reactions  . Latex Rash and Swelling    Other reaction(s): Unknown Skin turns red  . Morphine Other (See Comments) and Swelling    Patient states that her reaction is that her skin gets a bit red.   No urticaria or airway difficulty.    . Prednisone Other (See Comments)    Other reaction(s): Other (See Comments) Stayed up 30 days when pt. Was on prednisone insomnia  . Buprenorphine Hcl-Naloxone Hcl   . Duloxetine Other (See Comments)    Unknown  . Ketorolac Tromethamine   . Tramadol     Other  reaction(s): Other (See Comments) Seizures  . Poison Ivy Extract [Extract Of Poison Ivy] Rash  . Poison Oak Extract [Extract Of Poison Oak] Rash  . Sumac Rash    Medications:  I have reviewed the patient's current medications. Prior to Admission:  Prescriptions Prior to Admission  Medication Sig Dispense Refill Last Dose  . baclofen (LIORESAL) 10 MG tablet Take 10 mg by mouth 3 (three) times daily.    01/25/2016 at Unknown time  . clonazePAM (KLONOPIN) 2 MG tablet Take 1 tablet (2 mg total) by mouth 2 (two) times daily. 16 tablet 0 Past Week at Unknown time  . lactulose (CHRONULAC) 10 GM/15ML solution Take 30 mLs (20 g total) by mouth daily as needed for mild constipation. 120 mL 0 Past Week at Unknown time  . meloxicam (MOBIC) 15 MG tablet Take 15 mg by mouth daily. with food  5 01/25/2016 at Unknown time  . oxyCODONE (OXYCONTIN) 10 mg 12 hr tablet Take 1 tablet (10 mg total) by mouth every 12 (twelve) hours. 2 tablet 0 Past Week at Unknown time  . QUEtiapine (SEROQUEL XR) 300 MG 24 hr tablet Take 1 tablet (300 mg total) by mouth at bedtime. 8 tablet 0 Past Week at Unknown time  . Rivaroxaban (XARELTO) 15 MG TABS tablet Take 15 mg by mouth 2 (two) times daily with a meal.   Past Week at Unknown time  . lamoTRIgine (  LAMICTAL) 25 MG tablet Take 25 mg by mouth daily.   Not Taking at Unknown time  . levETIRAcetam (KEPPRA) 1000 MG tablet Take 1 tablet (1,000 mg total) by mouth 2 (two) times daily. (Patient not taking: Reported on 03/01/2016) 60 tablet 0 Not Taking at Unknown time   Scheduled: . baclofen  10 mg Oral TID  . clonazePAM  2 mg Oral BID  . lamoTRIgine  25 mg Oral Daily  . levETIRAcetam  1,000 mg Oral BID  . naLOXone (NARCAN)  injection  0.4 mg Intravenous Once  . QUEtiapine  300 mg Oral QHS  . Rivaroxaban  15 mg Oral BID WC  . sodium chloride flush  3 mL Intravenous Q12H    ROS: History obtained from chart review  General ROS: negative for - chills, fatigue, fever, night  sweats, weight gain or weight loss Psychological ROS: history of psychosis Ophthalmic ROS: negative for - blurry vision, double vision, eye pain or loss of vision ENT ROS: negative for - epistaxis, nasal discharge, oral lesions, sore throat, tinnitus or vertigo Allergy and Immunology ROS: negative for - hives or itchy/watery eyes Hematological and Lymphatic ROS: negative for - bleeding problems, bruising or swollen lymph nodes Endocrine ROS: negative for - galactorrhea, hair pattern changes, polydipsia/polyuria or temperature intolerance Respiratory ROS: negative for - cough, hemoptysis, shortness of breath or wheezing Cardiovascular ROS: negative for - chest pain, dyspnea on exertion, edema or irregular heartbeat Gastrointestinal ROS: negative for - abdominal pain, diarrhea, hematemesis, nausea/vomiting or stool incontinence Genito-Urinary ROS: negative for - dysuria, hematuria, incontinence or urinary frequency/urgency Musculoskeletal ROS: negative for - joint swelling or muscular weakness Neurological ROS: as noted in HPI Dermatological ROS: negative for rash and skin lesion changes  Physical Examination: Blood pressure 107/79, pulse 81, temperature 98.4 F (36.9 C), temperature source Oral, resp. rate 18, height 5\' 3"  (1.6 m), weight 61.9 kg (136 lb 7.4 oz), SpO2 99 %.  HEENT-  Normocephalic, no lesions, without obvious abnormality.  Normal external eye and conjunctiva.  Normal TM's bilaterally.  Normal auditory canals and external ears. Normal external nose, mucus membranes and septum.  Normal pharynx. Cardiovascular- S1, S2 normal, pulses palpable throughout   Lungs- chest clear, no wheezing, rales, normal symmetric air entry Abdomen- soft, non-tender; bowel sounds normal; no masses,  no organomegaly Extremities- no edema Lymph-no adenopathy palpable Musculoskeletal-no joint tenderness, deformity or swelling Skin-warm and dry, no hyperpigmentation, vitiligo, or suspicious  lesions  Neurological Examination Mental Status: Lethargic.  Easily awakened.  Oriented to year but remaining conversation tangential and at times not appropriate.  Speech mumbled at times but not dyspahsic.  Able to follow 3 step commands without difficulty. Cranial Nerves: II: Discs flat bilaterally; Visual fields grossly normal, pupils dilated, equal, round, reactive to light and accommodation III,IV, VI: ptosis not present, extra-ocular motions intact bilaterally V,VII: smile symmetric, facial light touch sensation normal bilaterally VIII: hearing normal bilaterally IX,X: gag reflex present XI: bilateral shoulder shrug XII: midline tongue extension Motor: Right : Upper extremity   5/5    Left:     Upper extremity   5/5  Lower extremity   5/5     Lower extremity   5/5 Tremor noted Sensory: Pinprick and light touch intact throughout, bilaterally Deep Tendon Reflexes: 2+ and symmetric throughout Plantars: Right: downgoing   Left: downgoing Cerebellar: Normal finger-to-nose and normal heel-to-shin testing bilaterally  Gait: not tested due to safety concerns    Laboratory Studies:   Basic Metabolic Panel:  Recent Labs Lab  02/29/16 2129 03/01/16 0519  NA 141 137  K 3.3* 4.1  CL 106 113*  CO2 17* 20*  GLUCOSE 134* 103*  BUN 8 10  CREATININE 0.80 0.59  CALCIUM 10.0 8.4*    Liver Function Tests: No results for input(s): AST, ALT, ALKPHOS, BILITOT, PROT, ALBUMIN in the last 168 hours. No results for input(s): LIPASE, AMYLASE in the last 168 hours.  Recent Labs Lab 03/01/16 0839  AMMONIA 15    CBC:  Recent Labs Lab 02/29/16 2129 03/01/16 0519  WBC 12.4* 9.4  HGB 14.4 12.1  HCT 43.5 34.5*  MCV 91.4 87.7  PLT 318 264    Cardiac Enzymes: No results for input(s): CKTOTAL, CKMB, CKMBINDEX, TROPONINI in the last 168 hours.  BNP: Invalid input(s): POCBNP  CBG:  Recent Labs Lab 02/29/16 2129 03/01/16 0331  GLUCAP 176* 134*    Microbiology: Results  for orders placed or performed during the hospital encounter of 02/29/16  MRSA PCR Screening     Status: None   Collection Time: 03/01/16  3:35 AM  Result Value Ref Range Status   MRSA by PCR NEGATIVE NEGATIVE Final    Comment:        The GeneXpert MRSA Assay (FDA approved for NASAL specimens only), is one component of a comprehensive MRSA colonization surveillance program. It is not intended to diagnose MRSA infection nor to guide or monitor treatment for MRSA infections.     Coagulation Studies: No results for input(s): LABPROT, INR in the last 72 hours.  Urinalysis:  Recent Labs Lab 03/01/16 0155  COLORURINE YELLOW*  LABSPEC 1.011  PHURINE 6.0  GLUCOSEU NEGATIVE  HGBUR NEGATIVE  BILIRUBINUR NEGATIVE  KETONESUR NEGATIVE  PROTEINUR NEGATIVE  NITRITE NEGATIVE  LEUKOCYTESUR NEGATIVE    Lipid Panel:  No results found for: CHOL, TRIG, HDL, CHOLHDL, VLDL, LDLCALC  HgbA1C: No results found for: HGBA1C  Urine Drug Screen:     Component Value Date/Time   LABOPIA POSITIVE (A) 03/01/2016 0155   COCAINSCRNUR NONE DETECTED 03/01/2016 0155   LABBENZ POSITIVE (A) 03/01/2016 0155   AMPHETMU NONE DETECTED 03/01/2016 0155   THCU NONE DETECTED 03/01/2016 0155   LABBARB NONE DETECTED 03/01/2016 0155    Alcohol Level:  Recent Labs Lab 02/29/16 2208  ETH <5    Other results: EKG: sinus tachycardia at 112 bpm.  Imaging: Ct Head Wo Contrast  Result Date: 03/01/2016 CLINICAL DATA:  35 year old female with pain with seizure. EXAM: CT HEAD WITHOUT CONTRAST TECHNIQUE: Contiguous axial images were obtained from the base of the skull through the vertex without intravenous contrast. COMPARISON:  CT dated 10/23/2015 FINDINGS: The ventricles and the sulci are appropriate in size for the patient's age. There is no intracranial hemorrhage. No midline shift or mass effect identified. The gray-white matter differentiation is preserved. The visualized paranasal sinuses and mastoid air  cells are well aerated. The calvarium is intact. IMPRESSION: No acute intracranial pathology. Electronically Signed   By: Elgie Collard M.D.   On: 03/01/2016 00:08     Assessment/Plan: 35 year old female with a history of seizures presenting with breakthrough seizures.  Patient on narcotics at home as well.  Patient out of her benzos and has a history of breakthrough seizures when out of benzos.  Unclear if she is misusing them.  Also some question posed as to whether she may be misusing her narcotics as well.  At this point patient back on home anticonvulsants and maintenance Klonopin doses.  Remains altered and unclear how much of  that is related to her possible narcotic abuse. No focal findings on her neurological examination.  Head CT personally reviewed and unremarkable.  No evidence of continued seizure activity.     Recommendations: 1. Agree with continued Lamictal and Keppra at home doses.  Would continue Klonopin as well with consideration for slow taper in the future 2. Seizure precautions 3.  Ativan prn seizure 4.  No further brain imaging indicated at this time.  4.  Agree with psychiatry involvement   Thana Farr, MD Neurology [phone] 03/01/2016, 11:02 AM

## 2016-03-01 NOTE — ED Notes (Signed)
Pt urinated on bed pan, pt then cleaned. Pt laying back on stretcher.

## 2016-03-01 NOTE — ED Notes (Signed)
Pt sleeping on stretcher on side. Rise and fall of chest noted. No extra work in  Breathing noted. Dad at bedside. IV's in R arm wrapped in gauze and coban.

## 2016-03-01 NOTE — Care Management Note (Signed)
Case Management Note  Patient Details  Name: [name] MRN: [id number] Date of Birth: May 18, 1981  Subjective/Objective:                  Patient is currently 1:1 with sitter for safety and IVC per nursing. Met with patient who was thrashing around in bed/bed sheets as if she was having a bad dream. I spoke patient's name and she sat straight up in bed stating she "had to pee" and started trying to get out of bed. I ask her to please stay in bed while sitter gets bed pan for her to use- patient agreed and waited for bed pan. Patient states she lives at home with her father "[name]". She states it is okay to talk to her father about her medical needs. She state her father will not let her drive sometimes. She states she sees "Dr. Sharol Roussel in [facility]" as PCP. She states she is independent with daily activities. She states that she has no trouble obtaining her seizure medications and states that she "does take them on schedule".   Action/Plan: RNCM to continue to follow. Assisted patient to bed pan due to fall restrictions and patient safety concerns.   Expected Discharge Date:                  Expected Discharge Plan:     In-House Referral:     Discharge planning Services  CM Consult  Post Acute Care Choice:    Choice offered to:  Patient  DME Arranged:    DME Agency:     HH Arranged:    [place] Agency:     Status of Service:  In process, will continue to follow  If discussed at Long Length of Stay Meetings, dates discussed:    Additional Comments:  Marshell Garfinkel, RN 03/01/2016, 11:17 AM

## 2016-03-02 DIAGNOSIS — F19921 Other psychoactive substance use, unspecified with intoxication with delirium: Secondary | ICD-10-CM

## 2016-03-02 NOTE — Progress Notes (Signed)
Patient pulled out both IV's and states she doesn't want them anymore.

## 2016-03-02 NOTE — Discharge Summary (Signed)
[facility] [place] [facility]   PATIENT NAME: [name]    MR#:  [id number]  DATE OF BIRTH:  04/01/1981  DATE OF ADMISSION:  02/29/2016 ADMITTING PHYSICIAN: Ihor AustinPavan Pyreddy, MD  DATE OF DISCHARGE: 03/02/2016 10:30 AM  PRIMARY CARE PHYSICIAN: Estell HarpinVINES,DAIN, MD     ADMISSION DIAGNOSIS:  Seizure disorder (HCC) [G40.909] Involuntary commitment [Z04.6] Overdose, undetermined intent, initial encounter [T50.904A] Altered mental status, unspecified altered mental status type [R41.82]  DISCHARGE DIAGNOSIS:  Principal Problem:   Substance-induced delirium (HCC) Active Problems:   Altered mental status   Seizure (HCC)   Hypokalemia   Leukocytosis   Tobacco abuse counseling   Schizoaffective disorder (HCC)   SECONDARY DIAGNOSIS:   Past Medical History:  Diagnosis Date  . Degeneration of lumbar intervertebral disc   . Seizures (HCC)   . Shoulder pain, right     .pro HOSPITAL COURSE:  The patient is a 35 year old Caucasian female with past medical history significant for history of seizure disorder, lower back pain, who presents to the hospital with seizure. It was described as shaking and being unresponsive with left eye deviation. Patient denied any misuse of her seizure medications, although admitted of running out of Klonopin. When evaluated in emergency room, she was noted to have pinpoint pupils, given IV Narcan, became agitated after which he required Ativan and Haldol. She was involuntarily committed in the emergency room . She Is seen by neurologist, who recommended to continue outpatient medications. Patient was also evaluated by psychiatrist, recommended to decrease the Klonopin doses if possible. Patient, however, admitted that she has seizures if Klonopin is not used at ordered doses. Involuntary commitment was discontinued by psychiatrist. It was felt that patient is stable to be discharged home today. Discussion by problem: #1. Seizure  disorder, patient was seen by neurologist, recommended to continue outpatient medication doses including Klonopin, considering slow tapering, patient is to follow-up with primary neurologist as outpatient #2. Altered mental status, confusion, unclear etiology, could be related to multiple drugs, seen on urine drug screen, including benzodiazepines, opiates, tricyclics, however, patient is on benzodiazepines as well as opiates at home. Her condition improved, diet was advanced, patient was evaluated by psychiatrist and involuntary commitment was lifted. Patient was felt to be stable to be discharged home.. Ammonia level was normal #3. Hypokalemia, resolved #4. Leukocytosis, resolved #5. Tobacco abuse. Counseling, discussed this patient for 3-4 minutes, nicotine replacement therapy is to be continued , patient was agreeable  DISCHARGE CONDITIONS:   Stable  CONSULTS OBTAINED:  Treatment Team:  Pauletta BrownsYuriy Zeylikman, MD Audery AmelJohn T Clapacs, MD  DRUG ALLERGIES:   Allergies  Allergen Reactions  . Latex Rash and Swelling    Other reaction(s): Unknown Skin turns red  . Morphine Other (See Comments) and Swelling    Patient states that her reaction is that her skin gets a bit red.   No urticaria or airway difficulty.    . Prednisone Other (See Comments)    Other reaction(s): Other (See Comments) Stayed up 30 days when pt. Was on prednisone insomnia  . Buprenorphine Hcl-Naloxone Hcl   . Duloxetine Other (See Comments)    Unknown  . Ketorolac Tromethamine   . Tramadol     Other reaction(s): Other (See Comments) Seizures  . Poison Ivy Extract [Extract Of Poison Ivy] Rash  . Poison Oak Extract [Extract Of Poison Oak] Rash  . Sumac Rash    DISCHARGE MEDICATIONS:   Discharge Medication List as of 03/02/2016  8:52 AM  START taking these medications   Details  nicotine (NICODERM CQ - DOSED IN MG/24 HOURS) 14 mg/24hr patch Place 1 patch (14 mg total) onto the skin daily., Starting Tue 03/01/2016, Normal       CONTINUE these medications which have CHANGED   Details  clonazePAM (KLONOPIN) 2 MG tablet Take 1 tablet (2 mg total) by mouth 2 (two) times daily., Starting Tue 03/01/2016, Print      CONTINUE these medications which have NOT CHANGED   Details  baclofen (LIORESAL) 10 MG tablet Take 10 mg by mouth 3 (three) times daily. , Historical Med    lactulose (CHRONULAC) 10 GM/15ML solution Take 30 mLs (20 g total) by mouth daily as needed for mild constipation., Starting Sat 01/23/2016, Print    meloxicam (MOBIC) 15 MG tablet Take 15 mg by mouth daily. with food, Starting Wed 09/02/2015, Historical Med    oxyCODONE (OXYCONTIN) 10 mg 12 hr tablet Take 1 tablet (10 mg total) by mouth every 12 (twelve) hours., Starting Mon 01/25/2016, Normal    QUEtiapine (SEROQUEL XR) 300 MG 24 hr tablet Take 1 tablet (300 mg total) by mouth at bedtime., Starting Thu 10/22/2015, Print    Rivaroxaban (XARELTO) 15 MG TABS tablet Take 15 mg by mouth 2 (two) times daily with a meal., Historical Med    lamoTRIgine (LAMICTAL) 25 MG tablet Take 25 mg by mouth daily., Historical Med    levETIRAcetam (KEPPRA) 1000 MG tablet Take 1 tablet (1,000 mg total) by mouth 2 (two) times daily., Starting Fri 10/23/2015, Print         DISCHARGE INSTRUCTIONS:    Patient is to follow-up with primary psychiatrist, Dr. [name] as outpatient  If you experience worsening of your admission symptoms, develop shortness of breath, life threatening emergency, suicidal or homicidal thoughts you must seek medical attention immediately by calling 911 or calling your MD immediately  if symptoms less severe.  You Must read complete instructions/literature along with all the possible adverse reactions/side effects for all the Medicines you take and that have been prescribed to you. Take any new Medicines after you have completely understood and accept all the possible adverse reactions/side effects.   Please note  You were cared for by a hospitalist  during your hospital stay. If you have any questions about your discharge medications or the care you received while you were in the hospital after you are discharged, you can call the unit and asked to speak with the hospitalist on call if the hospitalist that took care of you is not available. Once you are discharged, your primary care physician will handle any further medical issues. Please note that NO REFILLS for any discharge medications will be authorized once you are discharged, as it is imperative that you return to your primary care physician (or establish a relationship with a primary care physician if you do not have one) for your aftercare needs so that they can reassess your need for medications and monitor your lab values.    Today   CHIEF COMPLAINT:   Chief Complaint  Patient presents with  . Seizures    HISTORY OF PRESENT ILLNESS:  [name]  is a 35 y.o. female with a known history of seizure disorder, lower back pain, who presents to the hospital with seizure. It was described as shaking and being unresponsive with left eye deviation. Patient denied any misuse of her seizure medications, although admitted of running out of Klonopin. When evaluated in emergency room, she was noted to  have pinpoint pupils, given IV Narcan, became agitated after which he required Ativan and Haldol. She was involuntarily committed in the emergency room . She Is seen by neurologist, who recommended to continue outpatient medications. Patient was also evaluated by psychiatrist, recommended to decrease the Klonopin doses if possible. Patient, however, admitted that she has seizures if Klonopin is not used at ordered doses. Involuntary commitment was discontinued by psychiatrist. It was felt that patient is stable to be discharged home today. Discussion by problem: #1. Seizure disorder, patient was seen by neurologist, recommended to continue outpatient medication doses including Klonopin,  considering slow tapering, patient is to follow-up with primary neurologist as outpatient #2. Altered mental status, confusion, unclear etiology, could be related to multiple drugs, seen on urine drug screen, including benzodiazepines, opiates, tricyclics, however, patient is on benzodiazepines as well as opiates at home. Her condition improved, diet was advanced, patient was evaluated by psychiatrist and involuntary commitment was lifted. Patient was felt to be stable to be discharged home.. Ammonia level was normal #3. Hypokalemia, resolved #4. Leukocytosis, resolved #5. Tobacco abuse. Counseling, discussed this patient for 3-4 minutes, nicotine replacement therapy is to be continued , patient was agreeable     VITAL SIGNS:  Blood pressure 132/62, pulse (!) 110, temperature 98.7 F (37.1 C), temperature source Oral, resp. rate 16, height 5\' 3"  (1.6 m), weight 61.9 kg (136 lb 7.4 oz), SpO2 100 %.  I/O:   Intake/Output Summary (Last 24 hours) at 03/02/16 1321 Last data filed at 03/02/16 0800  Gross per 24 hour  Intake              720 ml  Output                0 ml  Net              720 ml    PHYSICAL EXAMINATION:  GENERAL:  35 y.o.-year-old patient lying in the bed with no acute distress.  EYES: Pupils equal, round, reactive to light and accommodation. No scleral icterus. Extraocular muscles intact.  HEENT: Head atraumatic, normocephalic. Oropharynx and nasopharynx clear.  NECK:  Supple, no jugular venous distention. No thyroid enlargement, no tenderness.  LUNGS: Normal breath sounds bilaterally, no wheezing, rales,rhonchi or crepitation. No use of accessory muscles of respiration.  CARDIOVASCULAR: S1, S2 normal. No murmurs, rubs, or gallops.  ABDOMEN: Soft, non-tender, non-distended. Bowel sounds present. No organomegaly or mass.  EXTREMITIES: No pedal edema, cyanosis, or clubbing.  NEUROLOGIC: Cranial nerves II through XII are intact. Muscle strength 5/5 in all extremities.  Sensation intact. Gait not checked.  PSYCHIATRIC: The patient is alert and oriented x 3.  SKIN: No obvious rash, lesion, or ulcer.   DATA REVIEW:   CBC  Recent Labs Lab 03/01/16 0519  WBC 9.4  HGB 12.1  HCT 34.5*  PLT 264    Chemistries   Recent Labs Lab 03/01/16 0519  NA 137  K 4.1  CL 113*  CO2 20*  GLUCOSE 103*  BUN 10  CREATININE 0.59  CALCIUM 8.4*    Cardiac Enzymes No results for input(s): TROPONINI in the last 168 hours.  Microbiology Results  Results for orders placed or performed during the hospital encounter of 02/29/16  MRSA PCR Screening     Status: None   Collection Time: 03/01/16  3:35 AM  Result Value Ref Range Status   MRSA by PCR NEGATIVE NEGATIVE Final    Comment:        The GeneXpert MRSA  Assay (FDA approved for NASAL specimens only), is one component of a comprehensive MRSA colonization surveillance program. It is not intended to diagnose MRSA infection nor to guide or monitor treatment for MRSA infections.     RADIOLOGY:  Ct Head Wo Contrast  Result Date: 03/01/2016 CLINICAL DATA:  35 year old female with pain with seizure. EXAM: CT HEAD WITHOUT CONTRAST TECHNIQUE: Contiguous axial images were obtained from the base of the skull through the vertex without intravenous contrast. COMPARISON:  CT dated 10/23/2015 FINDINGS: The ventricles and the sulci are appropriate in size for the patient's age. There is no intracranial hemorrhage. No midline shift or mass effect identified. The gray-white matter differentiation is preserved. The visualized paranasal sinuses and mastoid air cells are well aerated. The calvarium is intact. IMPRESSION: No acute intracranial pathology. Electronically Signed   By: Elgie Collard M.D.   On: 03/01/2016 00:08    EKG:   Orders placed or performed during the hospital encounter of 02/29/16  . ED EKG  . ED EKG  . EKG 12-Lead  . EKG 12-Lead      Management plans discussed with the patient, family and they  are in agreement.  CODE STATUS:     Code Status Orders        Start     Ordered   03/01/16 0334  Full code  Continuous     03/01/16 0333    Code Status History    Date Active Date Inactive Code Status Order ID Comments User Context   This patient has a current code status but no historical code status.      TOTAL TIME TAKING CARE OF THIS PATIENT: 40  minutes.    Katharina Caper M.D on 03/02/2016 at 1:21 PM  Between 7am to 6pm - Pager - [phone]  After 6pm go to [email or web address] - password [email or web address] [facility]  [facility]  Office  [phone]  CC: Primary care physician; Estell Harpin, MD

## 2016-03-02 NOTE — Plan of Care (Signed)
Problem: Bowel/Gastric: Goal: Will not experience complications related to bowel motility Outcome: Completed/Met Date Met: 03/02/16 Pt is to be discharged.

## 2016-03-02 NOTE — Progress Notes (Signed)
Shift assessment completed. Pt is awake, alert and oriented, in no distress. When asked, pt c/o pain to her back and her hip, 6/10, and requested oxy for the pain. This Clinical research associatewriter explained that oxy is not ordered for pt at this time, discuss with MD on rounds this am. R fa has a bruise near her wrist. Lungs are clear bilat, telepak in place, SR noted. Abdomen is soft, bs heard, pt denied difficulty walking, voiding, denied constipation. PPP, sacral dressing intact. Pt has no IV access,. Pt received am meds at her request. Srx2, call bell in reach. Pt ate 100% of breakfast.

## 2016-03-02 NOTE — Consult Note (Signed)
[facility] Face-to-Face Psychiatry Consult   Reason for Consult:  Consult for this 35 year old woman with a history of schizoaffective disorder and seizure disorder and chronic pain who came into the hospital with altered mental status Referring Physician:  Ether Griffins Patient Identification: [name] MRN:  [id number] Principal Diagnosis: Substance-induced delirium [facility]) Diagnosis:   Patient Active Problem List   Diagnosis Date Noted  . Substance-induced delirium ([place]) [W97.948] 03/02/2016  . Altered mental status [R41.82] 03/01/2016  . Seizure ([place]) [R56.9] 03/01/2016  . Hypokalemia [E87.6] 03/01/2016  . Leukocytosis [D72.829] 03/01/2016  . Tobacco abuse counseling [Z71.6] 03/01/2016  . Shoulder pain, right [M25.511] 10/25/2015  . Schizoaffective disorder ([place]) [F25.9] 10/22/2015  . 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 female patient admitted with "I had a seizure".  HPI:  Patient interviewed. Chart reviewed. Labs and vitals reviewed. Controlled substance database reviewed. Patient known from previous encounters. 35 year old woman who has a history of schizoaffective disorder probably some degree of chronic developmental disability a seizure disorder and chronic pain. Came into the hospital with altered mental status and questionable seizures. Along with everything else was given Narcan which woke her up pretty acutely. Question about possible overdose including intentional overdose. On interview today the patient says that she assumes that she had a seizure because she is in the hospital but doesn't remember actually having a seizure. She just remembers having been at home. Doesn't remember any recent changes in her mood. Denies having been depressed. Denies any suicidal thoughts at all. Denies having had any recent hallucinations or confused thinking. Patient says that she thinks she is been  taking her medicine appropriately although she admits that she really has no idea what medicine she is supposed to be taking. She can't tell me what all of her pain or nerve medicines aren't can't even tell me who prescribes her pain medicine. Looking at the controlled substance database it looks like in the last week or so she has gotten prescriptions for oxycodone immediate release 10 mg 4 times a day, OxyContin 15 mg 3 times a day, fentanyl patch 25 mg per day and clonazepam 2 mg twice a day. Although she has been on all of these things at one time or another in the past it looks to me like it adds up to a lot more drugs being given all at once then she had been on recently. Not sure if this is a mistake or if there was an intention to increase her pain medicine but it certainly looks like an awful lot of pain medicine for someone with chronic back pain. Patient totally denies having intentionally overdosed on it and knowing her limited cognition I suspect that she have any intention of doing it. More likely a victim of circumstance of prescribing.  Social history: Lives with her father and brother. Patient is chronically disabled. Does not work. Limited social activity.  Medical history: Has a reported history of a seizure disorder. She says she takes her seizure medicine regularly. She can't remember what it is. She remembers Lamictal but denies to me that she is prescribed Keppra. She doesn't really have any idea what her seizures are like.  Substance abuse history: Looks like substance abuse in the past mostly has been more of prescription drugs and not even necessarily intentional abuse at that.  Past Psychiatric History: Long-standing problems with chronic mental health issues. Has been  here in the hospital recently and was sent to [facility] because of psychotic symptoms. Gets psychotic easily. Does better when she is on her Seroquel. Unclear whether she's ever seriously tried to kill her self in  the past.  Risk to Self: Is patient at risk for suicide?: No Risk to Others:   Prior Inpatient Therapy:   Prior Outpatient Therapy:    Past Medical History:  Past Medical History:  Diagnosis Date  . Degeneration of lumbar intervertebral disc   . Seizures ([place])   . Shoulder pain, right     Past Surgical History:  Procedure Laterality Date  . CESAREAN SECTION     X2   Family History:  Family History  Problem Relation Age of Onset  . Cancer Mother    Family Psychiatric  History: Does have a family history of psychosis. Her brother also is chronically mentally ill. Social History:  History  Alcohol Use No     History  Drug Use No    Social History   Social History  . Marital status: Divorced    Spouse name: N/A  . Number of children: N/A  . Years of education: N/A   Occupational History  . unemployed    Social History Main Topics  . Smoking status: Current Every Day Smoker    Types: Cigarettes  . Smokeless tobacco: Never Used  . Alcohol use No  . Drug use: No  . Sexual activity: Not Asked   Other Topics Concern  . None   Social History Narrative  . None   Additional Social History:    Allergies:   Allergies  Allergen Reactions  . Latex Rash and Swelling    Other reaction(s): Unknown Skin turns red  . Morphine Other (See Comments) and Swelling    Patient states that her reaction is that her skin gets a bit red.   No urticaria or airway difficulty.    . Prednisone Other (See Comments)    Other reaction(s): Other (See Comments) Stayed up 30 days when pt. Was on prednisone insomnia  . Buprenorphine Hcl-Naloxone Hcl   . Duloxetine Other (See Comments)    Unknown  . Ketorolac Tromethamine   . Tramadol     Other reaction(s): Other (See Comments) Seizures  . Poison Ivy Extract [Extract Of Poison Ivy] Rash  . Poison Oak Extract [Extract Of Poison Oak] Rash  . Sumac Rash    Labs:  Results for orders placed or performed during the hospital  encounter of 02/29/16 (from the past 48 hour(s))  Glucose, capillary     Status: Abnormal   Collection Time: 02/29/16  9:29 PM  Result Value Ref Range   Glucose-Capillary 176 (H) 65 - 99 mg/dL  Basic metabolic panel - if new onset seizures     Status: Abnormal   Collection Time: 02/29/16  9:29 PM  Result Value Ref Range   Sodium 141 135 - 145 mmol/L   Potassium 3.3 (L) 3.5 - 5.1 mmol/L   Chloride 106 101 - 111 mmol/L   CO2 17 (L) 22 - 32 mmol/L   Glucose, Bld 134 (H) 65 - 99 mg/dL   BUN 8 6 - 20 mg/dL   Creatinine, Ser 0.80 0.44 - 1.00 mg/dL   Calcium 10.0 8.9 - 10.3 mg/dL   GFR calc non Af Amer >60 >60 mL/min   GFR calc Af Amer >60 >60 mL/min    Comment: (NOTE) The eGFR has been calculated using the CKD EPI equation. This calculation has  not been validated in all clinical situations. eGFR's persistently <60 mL/min signify possible Chronic Kidney Disease.    Anion gap 18 (H) 5 - 15  hCG, quantitative, pregnancy     Status: None   Collection Time: 02/29/16  9:29 PM  Result Value Ref Range   hCG, Beta Chain, Quant, S 1 <5 mIU/mL    Comment:          GEST. AGE      CONC.  (mIU/mL)   <=1 WEEK        5 - 50     2 WEEKS       50 - 500     3 WEEKS       100 - 10,000     4 WEEKS     1,000 - 30,000     5 WEEKS     3,500 - 115,000   6-8 WEEKS     12,000 - 270,000    12 WEEKS     15,000 - 220,000        FEMALE AND NON-PREGNANT FEMALE:     LESS THAN 5 mIU/mL   CBC - if new onset seizures     Status: Abnormal   Collection Time: 02/29/16  9:29 PM  Result Value Ref Range   WBC 12.4 (H) 3.6 - 11.0 K/uL   RBC 4.76 3.80 - 5.20 MIL/uL   Hemoglobin 14.4 12.0 - 16.0 g/dL   HCT 43.5 35.0 - 47.0 %   MCV 91.4 80.0 - 100.0 fL   MCH 30.2 26.0 - 34.0 pg   MCHC 33.0 32.0 - 36.0 g/dL   RDW 13.4 11.5 - 14.5 %   Platelets 318 150 - 440 K/uL  Carbamazepine (Tegretol) Level (if patient is taking this medication)     Status: Abnormal   Collection Time: 02/29/16  9:38 PM  Result Value Ref Range    Carbamazepine Lvl <2.0 (L) 4.0 - 12.0 ug/mL  Dilantin (phenytoin) Level (if patient is taking this medication)     Status: Abnormal   Collection Time: 02/29/16  9:38 PM  Result Value Ref Range   Phenytoin Lvl <2.5 (L) 10.0 - 20.0 ug/mL  Phenobarbital Level (if patient is taking this medication)     Status: Abnormal   Collection Time: 02/29/16  9:38 PM  Result Value Ref Range   Phenobarbital <5.0 (L) 15.0 - 30.0 ug/mL  Valproic Acid (depakote) Level (if patient is taking this medication)     Status: Abnormal   Collection Time: 02/29/16  9:38 PM  Result Value Ref Range   Valproic Acid Lvl <10 (L) 50.0 - 100.0 ug/mL  Ethanol     Status: None   Collection Time: 02/29/16 10:08 PM  Result Value Ref Range   Alcohol, Ethyl (B) <5 <5 mg/dL    Comment:        LOWEST DETECTABLE LIMIT FOR SERUM ALCOHOL IS 5 mg/dL FOR MEDICAL PURPOSES ONLY   Salicylate level     Status: None   Collection Time: 02/29/16 10:08 PM  Result Value Ref Range   Salicylate Lvl <0.5 2.8 - 30.0 mg/dL  Acetaminophen level     Status: Abnormal   Collection Time: 02/29/16 10:08 PM  Result Value Ref Range   Acetaminophen (Tylenol), Serum <10 (L) 10 - 30 ug/mL    Comment:        THERAPEUTIC CONCENTRATIONS VARY SIGNIFICANTLY. A RANGE OF 10-30 ug/mL MAY BE AN EFFECTIVE CONCENTRATION FOR MANY PATIENTS. HOWEVER, SOME ARE BEST TREATED AT  CONCENTRATIONS OUTSIDE THIS RANGE. ACETAMINOPHEN CONCENTRATIONS >150 ug/mL AT 4 HOURS AFTER INGESTION AND >50 ug/mL AT 12 HOURS AFTER INGESTION ARE OFTEN ASSOCIATED WITH TOXIC REACTIONS.   Urinalysis complete, with microscopic ([facility] only)     Status: Abnormal   Collection Time: 03/01/16  1:55 AM  Result Value Ref Range   Color, Urine YELLOW (A) YELLOW   APPearance CLEAR (A) CLEAR   Glucose, UA NEGATIVE NEGATIVE mg/dL   Bilirubin Urine NEGATIVE NEGATIVE   Ketones, ur NEGATIVE NEGATIVE mg/dL   Specific Gravity, Urine 1.011 1.005 - 1.030   Hgb urine dipstick NEGATIVE NEGATIVE   pH  6.0 5.0 - 8.0   Protein, ur NEGATIVE NEGATIVE mg/dL   Nitrite NEGATIVE NEGATIVE   Leukocytes, UA NEGATIVE NEGATIVE   RBC / HPF 0-5 0 - 5 RBC/hpf   WBC, UA 0-5 0 - 5 WBC/hpf   Bacteria, UA RARE (A) NONE SEEN   Squamous Epithelial / LPF 0-5 (A) NONE SEEN  Urine Drug Screen, Qualitative ([facility] only)     Status: Abnormal   Collection Time: 03/01/16  1:55 AM  Result Value Ref Range   Tricyclic, Ur Screen POSITIVE (A) NONE DETECTED   Amphetamines, Ur Screen NONE DETECTED NONE DETECTED   MDMA (Ecstasy)Ur Screen NONE DETECTED NONE DETECTED   Cocaine Metabolite,Ur [place] NONE DETECTED NONE DETECTED   Opiate, Ur Screen POSITIVE (A) NONE DETECTED   Phencyclidine (PCP) Ur S NONE DETECTED NONE DETECTED   Cannabinoid 50 Ng, Ur [place] NONE DETECTED NONE DETECTED   Barbiturates, Ur Screen NONE DETECTED NONE DETECTED   Benzodiazepine, Ur Scrn POSITIVE (A) NONE DETECTED   Methadone Scn, Ur NONE DETECTED NONE DETECTED    Comment: (NOTE) 697  Tricyclics, urine               Cutoff 1000 ng/mL 200  Amphetamines, urine             Cutoff 1000 ng/mL 300  MDMA (Ecstasy), urine           Cutoff 500 ng/mL 400  Cocaine Metabolite, urine       Cutoff 300 ng/mL 500  Opiate, urine                   Cutoff 300 ng/mL 600  Phencyclidine (PCP), urine      Cutoff 25 ng/mL 700  Cannabinoid, urine              Cutoff 50 ng/mL 800  Barbiturates, urine             Cutoff 200 ng/mL 900  Benzodiazepine, urine           Cutoff 200 ng/mL 1000 Methadone, urine                Cutoff 300 ng/mL 1100 1200 The urine drug screen provides only a preliminary, unconfirmed 1300 analytical test result and should not be used for non-medical 1400 purposes. Clinical consideration and professional judgment should 1500 be applied to any positive drug screen result due to possible 1600 interfering substances. A more specific alternate chemical method 1700 must be used in order to obtain a confirmed analytical result.  1800 Gas chromato graphy /  mass spectrometry (GC/MS) is the preferred 1900 confirmatory method.   Glucose, capillary     Status: Abnormal   Collection Time: 03/01/16  3:31 AM  Result Value Ref Range   Glucose-Capillary 134 (H) 65 - 99 mg/dL  MRSA PCR Screening     Status: None  Collection Time: 03/01/16  3:35 AM  Result Value Ref Range   MRSA by PCR NEGATIVE NEGATIVE    Comment:        The GeneXpert MRSA Assay (FDA approved for NASAL specimens only), is one component of a comprehensive MRSA colonization surveillance program. It is not intended to diagnose MRSA infection nor to guide or monitor treatment for MRSA infections.   Basic metabolic panel     Status: Abnormal   Collection Time: 03/01/16  5:19 AM  Result Value Ref Range   Sodium 137 135 - 145 mmol/L   Potassium 4.1 3.5 - 5.1 mmol/L   Chloride 113 (H) 101 - 111 mmol/L   CO2 20 (L) 22 - 32 mmol/L   Glucose, Bld 103 (H) 65 - 99 mg/dL   BUN 10 6 - 20 mg/dL   Creatinine, Ser 0.59 0.44 - 1.00 mg/dL   Calcium 8.4 (L) 8.9 - 10.3 mg/dL   GFR calc non Af Amer >60 >60 mL/min   GFR calc Af Amer >60 >60 mL/min    Comment: (NOTE) The eGFR has been calculated using the CKD EPI equation. This calculation has not been validated in all clinical situations. eGFR's persistently <60 mL/min signify possible Chronic Kidney Disease.    Anion gap 4 (L) 5 - 15  CBC     Status: Abnormal   Collection Time: 03/01/16  5:19 AM  Result Value Ref Range   WBC 9.4 3.6 - 11.0 K/uL   RBC 3.94 3.80 - 5.20 MIL/uL   Hemoglobin 12.1 12.0 - 16.0 g/dL   HCT 34.5 (L) 35.0 - 47.0 %   MCV 87.7 80.0 - 100.0 fL   MCH 30.6 26.0 - 34.0 pg   MCHC 34.9 32.0 - 36.0 g/dL   RDW 13.4 11.5 - 14.5 %   Platelets 264 150 - 440 K/uL  Ammonia     Status: None   Collection Time: 03/01/16  8:39 AM  Result Value Ref Range   Ammonia 15 9 - 35 umol/L    Current Facility-Administered Medications  Medication Dose Route Frequency Provider Last Rate Last Dose  . 0.9 %  sodium chloride  infusion   Intravenous Continuous Saundra Shelling, MD 100 mL/hr at 03/01/16 1243    . acetaminophen (TYLENOL) tablet 650 mg  650 mg Oral Q6H PRN Saundra Shelling, MD   650 mg at 03/01/16 1309   Or  . acetaminophen (TYLENOL) suppository 650 mg  650 mg Rectal Q6H PRN Saundra Shelling, MD      . baclofen (LIORESAL) tablet 10 mg  10 mg Oral TID Saundra Shelling, MD   10 mg at 03/01/16 2100  . clonazePAM (KLONOPIN) tablet 2 mg  2 mg Oral BID Saundra Shelling, MD   2 mg at 03/01/16 2100  . lactulose (CHRONULAC) 10 GM/15ML solution 20 g  20 g Oral Daily PRN Saundra Shelling, MD      . lamoTRIgine (LAMICTAL) tablet 25 mg  25 mg Oral Daily Pavan Pyreddy, MD   25 mg at 03/01/16 1031  . levETIRAcetam (KEPPRA) tablet 1,000 mg  1,000 mg Oral BID Saundra Shelling, MD   1,000 mg at 03/01/16 2100  . LORazepam (ATIVAN) injection 2 mg  2 mg Intravenous Q4H PRN Saundra Shelling, MD      . naloxone (NARCAN) injection 0.4 mg  0.4 mg Intravenous Once Delman Kitten, MD      . nicotine (NICODERM CQ - dosed in mg/24 hours) patch 14 mg  14 mg Transdermal Daily Rima Vaickute,  MD   14 mg at 03/01/16 1243  . ondansetron (ZOFRAN) tablet 4 mg  4 mg Oral Q6H PRN Saundra Shelling, MD       Or  . ondansetron (ZOFRAN) injection 4 mg  4 mg Intravenous Q6H PRN Pavan Pyreddy, MD      . QUEtiapine (SEROQUEL XR) 24 hr tablet 300 mg  300 mg Oral QHS Pavan Pyreddy, MD   300 mg at 03/01/16 2250  . Rivaroxaban (XARELTO) tablet 15 mg  15 mg Oral BID WC Saundra Shelling, MD   15 mg at 03/01/16 1732  . sodium chloride flush (NS) 0.9 % injection 3 mL  3 mL Intravenous Q12H Saundra Shelling, MD   3 mL at 03/01/16 1000    Musculoskeletal: Strength & Muscle Tone: within normal limits Gait & Station: normal Patient leans: N/A  Psychiatric Specialty Exam: Physical Exam  Nursing note and vitals reviewed. Constitutional: She appears well-developed and well-nourished.  HENT:  Head: Normocephalic and atraumatic.  Eyes: Conjunctivae are normal. Pupils are equal, round, and  reactive to light.  Neck: Normal range of motion.  Cardiovascular: Regular rhythm and normal heart sounds.   Respiratory: Effort normal. No respiratory distress.  GI: Soft.  Musculoskeletal: Normal range of motion.  Neurological: She is alert.  Skin: Skin is warm and dry.  Psychiatric: Her affect is blunt. Her speech is delayed. She is slowed. Thought content is not paranoid and not delusional. She expresses inappropriate judgment. She expresses no homicidal and no suicidal ideation. She exhibits abnormal recent memory and abnormal remote memory.    Review of Systems  Constitutional: Negative.   HENT: Negative.   Eyes: Negative.   Respiratory: Negative.   Cardiovascular: Negative.   Gastrointestinal: Negative.   Musculoskeletal: Positive for back pain.  Skin: Negative.   Neurological: Positive for seizures.  Psychiatric/Behavioral: Negative for depression, hallucinations, memory loss, substance abuse and suicidal ideas. The patient is not nervous/anxious and does not have insomnia.     Blood pressure 109/68, pulse 77, temperature 98.4 F (36.9 C), temperature source Oral, resp. rate 16, height 5' 3"  (1.6 m), weight 61.9 kg (136 lb 7.4 oz), SpO2 100 %.Body mass index is 24.17 kg/m.  General Appearance: Casual  Eye Contact:  Fair  Speech:  Slow  Volume:  Decreased  Mood:  Euthymic  Affect:  Constricted  Thought Process:  Goal Directed  Orientation:  Full (Time, Place, and Person)  Thought Content:  Simplistic and concrete  Suicidal Thoughts:  No  Homicidal Thoughts:  No  Memory:  Immediate;   Fair Recent;   Poor Remote;   Fair  Judgement:  Impaired  Insight:  Shallow  Psychomotor Activity:  Decreased  Concentration:  Concentration: Poor  Recall:  [employer] of Knowledge:  Fair  Language:  Fair  Akathisia:  No  Handed:  Right  AIMS (if indicated):     Assets:  Financial Resources/Insurance Housing Social Support  ADL's:  Intact  Cognition:  Impaired,  Mild  Sleep:         Treatment Plan Summary: Plan 35 year old woman who came in with altered mental status. On interview with the patient and from review of the chart I don't think it's likely that she was intentionally trying to overdose. It looks like she is prescribed of rather large and complicated amount of pain medicine which in combination with her clonazepam certainly is potentially dangerous. Patient is not very cognitively sophisticated and it would be very easy for her to make mistakes. I  really don't think she should be on nearly this much sedating medicine. Tried to convey that to the patient. I don't think she probably needs commitment and I have discontinued the IVC and the sitter. Patient will not need psychiatric admission. Can be discharged home and will follow-up with her usual psychiatrist, Dr. Kasandra Knudsen.  Disposition: Patient does not meet criteria for psychiatric inpatient admission. Supportive therapy provided about ongoing stressors.  Alethia Berthold, MD 03/02/2016 12:03 AM

## 2016-03-02 NOTE — Progress Notes (Signed)
Pt was dc'd at 0945. This Clinical research associatewriter gave pt her d/c instructions and a script for klonopin to be filled at the pharmacy. Pt anxious and ambulating in the hallway prior to leaving, waiting for her ride. Pt signed that she had received instructions, instructions also briefly reviewed with pt's father, who came to pick her up, both indicated verbally they understood. This writer walked pt to the front entrance of the facility and waiting family car. Pt did not have an IV site to remove at d/c.

## 2016-03-03 LAB — LEVETIRACETAM LEVEL: LEVETIRACETAM: NOT DETECTED ug/mL (ref 10.0–40.0)

## 2016-03-23 ENCOUNTER — Encounter: Payer: Self-pay | Admitting: Emergency Medicine

## 2016-03-23 ENCOUNTER — Emergency Department
Admission: EM | Admit: 2016-03-23 | Discharge: 2016-03-24 | Disposition: A | Discharge status: Other Acute Inpatient Hospital | Payer: Medicare Other | Attending: Emergency Medicine | Admitting: Emergency Medicine

## 2016-03-23 ENCOUNTER — Emergency Department: Payer: Medicare Other

## 2016-03-23 DIAGNOSIS — F250 Schizoaffective disorder, bipolar type: Secondary | ICD-10-CM | POA: Diagnosis not present

## 2016-03-23 DIAGNOSIS — F17210 Nicotine dependence, cigarettes, uncomplicated: Secondary | ICD-10-CM | POA: Diagnosis not present

## 2016-03-23 DIAGNOSIS — F79 Unspecified intellectual disabilities: Secondary | ICD-10-CM

## 2016-03-23 DIAGNOSIS — F1910 Other psychoactive substance abuse, uncomplicated: Secondary | ICD-10-CM | POA: Diagnosis not present

## 2016-03-23 DIAGNOSIS — Z91040 Latex allergy status: Secondary | ICD-10-CM | POA: Insufficient documentation

## 2016-03-23 DIAGNOSIS — Z791 Long term (current) use of non-steroidal anti-inflammatories (NSAID): Secondary | ICD-10-CM | POA: Insufficient documentation

## 2016-03-23 DIAGNOSIS — R4182 Altered mental status, unspecified: Secondary | ICD-10-CM | POA: Diagnosis present

## 2016-03-23 DIAGNOSIS — F19921 Other psychoactive substance use, unspecified with intoxication with delirium: Secondary | ICD-10-CM | POA: Diagnosis present

## 2016-03-23 DIAGNOSIS — T50904A Poisoning by unspecified drugs, medicaments and biological substances, undetermined, initial encounter: Secondary | ICD-10-CM

## 2016-03-23 DIAGNOSIS — F1110 Opioid abuse, uncomplicated: Secondary | ICD-10-CM | POA: Diagnosis present

## 2016-03-23 DIAGNOSIS — T50901A Poisoning by unspecified drugs, medicaments and biological substances, accidental (unintentional), initial encounter: Secondary | ICD-10-CM | POA: Insufficient documentation

## 2016-03-23 LAB — COMPREHENSIVE METABOLIC PANEL
ALBUMIN: 3.3 g/dL — AB (ref 3.5–5.0)
ALT: 8 U/L — AB (ref 14–54)
AST: 12 U/L — AB (ref 15–41)
Alkaline Phosphatase: 69 U/L (ref 38–126)
Anion gap: 6 (ref 5–15)
BUN: 9 mg/dL (ref 6–20)
CHLORIDE: 112 mmol/L — AB (ref 101–111)
CO2: 21 mmol/L — AB (ref 22–32)
CREATININE: 0.81 mg/dL (ref 0.44–1.00)
Calcium: 8.3 mg/dL — ABNORMAL LOW (ref 8.9–10.3)
GFR calc Af Amer: >60 mL/min (ref >60)
GFR calc non Af Amer: >60 mL/min (ref >60)
GLUCOSE: 98 mg/dL (ref 65–99)
Potassium: 3.6 mmol/L (ref 3.5–5.1)
SODIUM: 139 mmol/L (ref 135–145)
Total Bilirubin: 0.5 mg/dL (ref 0.3–1.2)
Total Protein: 6.7 g/dL (ref 6.5–8.1)

## 2016-03-23 LAB — CBC WITH DIFFERENTIAL/PLATELET
BASOS ABS: 0.1 10*3/uL (ref 0–0.1)
BASOS PCT: 1 %
EOS ABS: 0.1 10*3/uL (ref 0–0.7)
EOS PCT: 1 %
HCT: 36.4 % (ref 35.0–47.0)
Hemoglobin: 12.9 g/dL (ref 12.0–16.0)
LYMPHS PCT: 19 %
Lymphs Abs: 1.9 10*3/uL (ref 1.0–3.6)
MCH: 30.7 pg (ref 26.0–34.0)
MCHC: 35.4 g/dL (ref 32.0–36.0)
MCV: 86.8 fL (ref 80.0–100.0)
Monocytes Absolute: 0.5 10*3/uL (ref 0.2–0.9)
Monocytes Relative: 5 %
Neutro Abs: 7.3 10*3/uL — ABNORMAL HIGH (ref 1.4–6.5)
Neutrophils Relative %: 74 %
PLATELETS: 214 10*3/uL (ref 150–440)
RBC: 4.2 MIL/uL (ref 3.80–5.20)
RDW: 13.5 % (ref 11.5–14.5)
WBC: 9.8 10*3/uL (ref 3.6–11.0)

## 2016-03-23 LAB — URINALYSIS COMPLETE WITH MICROSCOPIC (ARMC ONLY)
Bacteria, UA: NONE SEEN
Bilirubin Urine: NEGATIVE
Glucose, UA: NEGATIVE mg/dL
HGB URINE DIPSTICK: NEGATIVE
Ketones, ur: NEGATIVE mg/dL
Leukocytes, UA: NEGATIVE
Nitrite: NEGATIVE
PH: 6 (ref 5.0–8.0)
PROTEIN: NEGATIVE mg/dL
Specific Gravity, Urine: 1.017 (ref 1.005–1.030)

## 2016-03-23 LAB — URINE DRUG SCREEN, QUALITATIVE (ARMC ONLY)
Amphetamines, Ur Screen: NOT DETECTED
BARBITURATES, UR SCREEN: NOT DETECTED
BENZODIAZEPINE, UR SCRN: POSITIVE — AB
CANNABINOID 50 NG, UR ~~LOC~~: NOT DETECTED
Cocaine Metabolite,Ur ~~LOC~~: POSITIVE — AB
MDMA (Ecstasy)Ur Screen: NOT DETECTED
METHADONE SCREEN, URINE: NOT DETECTED
OPIATE, UR SCREEN: NOT DETECTED
Phencyclidine (PCP) Ur S: NOT DETECTED
TRICYCLIC, UR SCREEN: POSITIVE — AB

## 2016-03-23 LAB — ACETAMINOPHEN LEVEL: Acetaminophen (Tylenol), Serum: <10 ug/mL — ABNORMAL LOW (ref 10–30)

## 2016-03-23 LAB — SALICYLATE LEVEL: Salicylate Lvl: <4 mg/dL (ref 2.8–30.0)

## 2016-03-23 LAB — GLUCOSE, CAPILLARY: GLUCOSE-CAPILLARY: 100 mg/dL — AB (ref 65–99)

## 2016-03-23 LAB — TROPONIN I: Troponin I: <0.03 ng/mL (ref <0.03)

## 2016-03-23 LAB — ETHANOL: Alcohol, Ethyl (B): <5 mg/dL (ref <5)

## 2016-03-23 MED ORDER — HALOPERIDOL LACTATE 5 MG/ML IJ SOLN
5.0000 mg | Freq: Once | INTRAMUSCULAR | Status: DC
  Filled 2016-03-23: qty 1

## 2016-03-23 MED ORDER — HALOPERIDOL LACTATE 5 MG/ML IJ SOLN
INTRAMUSCULAR | Status: AC
  Administered 2016-03-23: 5 mg via INTRAVENOUS
  Filled 2016-03-23: qty 1

## 2016-03-23 MED ORDER — HALOPERIDOL LACTATE 5 MG/ML IJ SOLN
5.0000 mg | Freq: Once | INTRAMUSCULAR | Status: AC
  Administered 2016-03-23 (×2): 5 mg via INTRAVENOUS
  Filled 2016-03-23: qty 1

## 2016-03-23 MED ORDER — SODIUM CHLORIDE 0.9 % IV SOLN
1000.0000 mL | Freq: Once | INTRAVENOUS | Status: AC
  Administered 2016-03-23: 1000 mL via INTRAVENOUS

## 2016-03-23 NOTE — ED Triage Notes (Addendum)
EMS called for a cardiac arrest. Pt was able to answer questions on EMS arrival. Pt is alert on arrival to ED . EMS found that pt's clonazepam 2mg  and oxycontin 15mg  are empty. Pt is moving around and is not speaking at this time. Pt is being changed out into burgundy scrubs. Pt was given 1 of Narcan before arrival

## 2016-03-23 NOTE — Progress Notes (Signed)
This Clinical research associatewriter was informed by RN patient was sedated and is not waking fully to answer questions.  RN was asked to discontinue the consult until the patient can be assessed.       Maryelizabeth Rowanressa Keirah Konitzer, MSW, Clare CharonLCSW, LCAS [facility] Triage Specialist [phone]

## 2016-03-23 NOTE — ED Notes (Signed)
Pt placed on a sticker pulse ox due to unavail reading on regular pulse ox

## 2016-03-23 NOTE — ED Notes (Signed)
Pt was still moving around in bed. Pt was given a warm blanket and finally laid down on stretcher. Tammy, NT is at pt's bedside. Pt's VS are stable at this time. IV is infusing well, but is positional. IV site is wrapped at this time to prevent IV site from becoming unintentionally removed.

## 2016-03-23 NOTE — ED Notes (Signed)
Dr. Mayford KnifeWilliams at pts bedside at this time.

## 2016-03-23 NOTE — ED Notes (Addendum)
Nurse notified of BP change

## 2016-03-23 NOTE — ED Notes (Signed)
Report given to Kuch, RN.  

## 2016-03-23 NOTE — ED Provider Notes (Addendum)
[facility] Emergency Department Provider Note    Level V caveat: Review of systems and history is limited by confusion and agitation    Time seen: ----------------------------------------- 2:47 PM on 03/23/2016 -----------------------------------------    I have reviewed the triage vital signs and the nursing notes.   HISTORY  Chief Complaint Drug Overdose    HPI [name] is a 35 y.o. female who presents to the ER after EMS was called to her house for cardiac arrest. Patient was able to answer questions on EMS arrival, according to the report, the father provided chest compressions and mouth-to-mouth resuscitation. Patient was alert on arrival to the ED but confused. EMS found the patient's Klonopin and OxyContin bottles were both M.D. that were filled this month. Patient arrives confused and agitated   Past Medical History:  Diagnosis Date  . Degeneration of lumbar intervertebral disc   . Seizures (HCC)   . Shoulder pain, right     Patient Active Problem List   Diagnosis Date Noted  . Substance-induced delirium (HCC) 03/02/2016  . Altered mental status 03/01/2016  . Seizure (HCC) 03/01/2016  . Hypokalemia 03/01/2016  . Leukocytosis 03/01/2016  . Tobacco abuse counseling 03/01/2016  . Shoulder pain, right 10/25/2015  . Schizoaffective disorder (HCC) 10/22/2015  . Opiate abuse, episodic 10/22/2015    Past Surgical History:  Procedure Laterality Date  . CESAREAN SECTION     X2    Allergies Latex; Morphine; Prednisone; Buprenorphine hcl-naloxone hcl; Duloxetine; Ketorolac tromethamine; Tramadol; Poison ivy extract [poison ivy extract]; Poison oak extract [employer][extract of poison oak]; and Sumac  Social History Social History  Substance Use Topics  . Smoking status: Current Every Day Smoker    Types: Cigarettes  . Smokeless tobacco: Never Used  . Alcohol use No   Review of Systems Unknown at this  time  ____________________________________________   PHYSICAL EXAM:  VITAL SIGNS: ED Triage Vitals [03/23/16 1442]  Enc Vitals Group     BP      Pulse      Resp      Temp      Temp src      SpO2      Weight 136 lb (61.7 kg)     Height 5\' 3"  (1.6 m)     Head Circumference      Peak Flow      Pain Score      Pain Loc      Pain Edu?      Excl. in GC?     Constitutional: Alert But disoriented, mild distress  Eyes: Conjunctivae are normal. PERRL. Normal extraocular movements. ENT   Head: Normocephalic and atraumatic.   Nose: No congestion/rhinnorhea.   Mouth/Throat: Mucous membranes are moist.   Neck: No stridor. Cardiovascular: Normal rate, regular rhythm. No murmurs, rubs, or gallops. Respiratory: Normal respiratory effort without tachypnea nor retractions. Breath sounds are clear and equal bilaterally. No wheezes/rales/rhonchi. Gastrointestinal: Soft and nontender. Normal bowel sounds Musculoskeletal: Nontender with normal range of motion in all extremities. No lower extremity tenderness nor edema. Neurologic:  Normal speech and language. No gross focal neurologic deficits are appreciated. Patient intermittently follows commands Skin:  Skin is warm, dry and intact. No rash noted. Psychiatric: Mood and affect are normal. Speech and behavior are abnormal ____________________________________________  EKG: Interpreted by me.Sinus rhythm rate 88 bpm, borderline rightward axis, long QT, flat T waves.  ____________________________________________  ED COURSE:  Pertinent labs & imaging results that were available during my care of the  patient were reviewed by me and considered in my medical decision making (see chart for details). Clinical Course  Patient presents for altered mental status of uncertain etiology. Likely polysubstance abuse related. We will assess with basic labs, she may require  antipsychotics.  Procedures ____________________________________________   LABS (pertinent positives/negatives)  Labs Reviewed  CBC WITH DIFFERENTIAL/PLATELET - Abnormal; Notable for the following:       Result Value   Neutro Abs 7.3 (*)    All other components within normal limits  COMPREHENSIVE METABOLIC PANEL - Abnormal; Notable for the following:    Chloride 112 (*)    CO2 21 (*)    Calcium 8.3 (*)    Albumin 3.3 (*)    AST 12 (*)    ALT 8 (*)    All other components within normal limits  URINALYSIS COMPLETEWITH MICROSCOPIC ([facility] ONLY) - Abnormal; Notable for the following:    Color, Urine YELLOW (*)    APPearance CLEAR (*)    Squamous Epithelial / LPF 0-5 (*)    All other components within normal limits  URINE DRUG SCREEN, QUALITATIVE ([facility] ONLY) - Abnormal; Notable for the following:    Tricyclic, Ur Screen POSITIVE (*)    Cocaine Metabolite,Ur [place] POSITIVE (*)    Benzodiazepine, Ur Scrn POSITIVE (*)    All other components within normal limits  ACETAMINOPHEN LEVEL - Abnormal; Notable for the following:    Acetaminophen (Tylenol), Serum <10 (*)    All other components within normal limits  GLUCOSE, CAPILLARY - Abnormal; Notable for the following:    Glucose-Capillary 100 (*)    All other components within normal limits  TROPONIN I  ETHANOL  SALICYLATE LEVEL  CBG MONITORING, ED    RADIOLOGY  CT head Is unremarkable ____________________________________________  FINAL ASSESSMENT AND PLAN  Altered mental status, Overdose, polysubstance abuse  Plan: Patient with labs and imaging as dictated above. Patient as needed Haldol due to combativeness. She is currently under monitored care with a sitter in the room. Currently she does not need restraints and is improved after Haldol. I will place her under involuntary commitment. She appears medically stable for psychiatric evaluation at this time.   Emily Filbert, MD   Note: This dictation was prepared with  Dragon dictation. Any transcriptional errors that result from this process are unintentional    Emily Filbert, MD 03/23/16 1706    Emily Filbert, MD 03/23/16 1738    Emily Filbert, MD 03/23/16 [phone]

## 2016-03-23 NOTE — Progress Notes (Signed)
This writer attempted for the second time to assess this patient and unable to arouse her. Medical staff never discontinued the consult.       [name] Brosch, MSW, Clare CharonLCSW, LCAS [facility] Triage Specialist [phone]

## 2016-03-23 NOTE — ED Notes (Signed)
Pt rolled over from her back to her side, pt talking in her sleep and RR increased along with HR, pt continuously saying "ouch that hurts" this tech ask pt "what is hurting you" pt doesn't respond and doses back off

## 2016-03-24 ENCOUNTER — Inpatient Hospital Stay
Admission: AD | Admit: 2016-03-24 | Discharge: 2016-03-28 | DRG: 885 | Disposition: A | Discharge status: Home / Self Care | Payer: Medicare Other | Source: Intra-hospital | Attending: Psychiatry | Admitting: Psychiatry

## 2016-03-24 DIAGNOSIS — Z79899 Other long term (current) drug therapy: Secondary | ICD-10-CM

## 2016-03-24 DIAGNOSIS — Z7901 Long term (current) use of anticoagulants: Secondary | ICD-10-CM

## 2016-03-24 DIAGNOSIS — F419 Anxiety disorder, unspecified: Secondary | ICD-10-CM | POA: Diagnosis present

## 2016-03-24 DIAGNOSIS — R45851 Suicidal ideations: Secondary | ICD-10-CM | POA: Diagnosis present

## 2016-03-24 DIAGNOSIS — F19921 Other psychoactive substance use, unspecified with intoxication with delirium: Secondary | ICD-10-CM | POA: Diagnosis present

## 2016-03-24 DIAGNOSIS — Z818 Family history of other mental and behavioral disorders: Secondary | ICD-10-CM | POA: Diagnosis not present

## 2016-03-24 DIAGNOSIS — F1910 Other psychoactive substance abuse, uncomplicated: Secondary | ICD-10-CM | POA: Diagnosis not present

## 2016-03-24 DIAGNOSIS — Z888 Allergy status to other drugs, medicaments and biological substances status: Secondary | ICD-10-CM

## 2016-03-24 DIAGNOSIS — Z886 Allergy status to analgesic agent status: Secondary | ICD-10-CM | POA: Diagnosis not present

## 2016-03-24 DIAGNOSIS — Z91040 Latex allergy status: Secondary | ICD-10-CM

## 2016-03-24 DIAGNOSIS — Z9119 Patient's noncompliance with other medical treatment and regimen: Secondary | ICD-10-CM

## 2016-03-24 DIAGNOSIS — F29 Unspecified psychosis not due to a substance or known physiological condition: Secondary | ICD-10-CM | POA: Diagnosis present

## 2016-03-24 DIAGNOSIS — R569 Unspecified convulsions: Secondary | ICD-10-CM

## 2016-03-24 DIAGNOSIS — Z86718 Personal history of other venous thrombosis and embolism: Secondary | ICD-10-CM | POA: Diagnosis not present

## 2016-03-24 DIAGNOSIS — F259 Schizoaffective disorder, unspecified: Principal | ICD-10-CM | POA: Diagnosis present

## 2016-03-24 DIAGNOSIS — Z79891 Long term (current) use of opiate analgesic: Secondary | ICD-10-CM | POA: Diagnosis not present

## 2016-03-24 DIAGNOSIS — G4089 Other seizures: Secondary | ICD-10-CM | POA: Diagnosis present

## 2016-03-24 DIAGNOSIS — F203 Undifferentiated schizophrenia: Secondary | ICD-10-CM | POA: Diagnosis present

## 2016-03-24 DIAGNOSIS — F1110 Opioid abuse, uncomplicated: Secondary | ICD-10-CM | POA: Diagnosis present

## 2016-03-24 DIAGNOSIS — Z23 Encounter for immunization: Secondary | ICD-10-CM

## 2016-03-24 DIAGNOSIS — F79 Unspecified intellectual disabilities: Secondary | ICD-10-CM | POA: Diagnosis present

## 2016-03-24 DIAGNOSIS — Z885 Allergy status to narcotic agent status: Secondary | ICD-10-CM | POA: Diagnosis not present

## 2016-03-24 DIAGNOSIS — F250 Schizoaffective disorder, bipolar type: Secondary | ICD-10-CM

## 2016-03-24 MED ORDER — OXYCODONE HCL ER 10 MG PO T12A
10.0000 mg | EXTENDED_RELEASE_TABLET | Freq: Two times a day (BID) | ORAL | Status: DC
  Administered 2016-03-24: 10 mg via ORAL
  Filled 2016-03-24: qty 1

## 2016-03-24 MED ORDER — DIPHENHYDRAMINE HCL 50 MG/ML IJ SOLN
50.0000 mg | Freq: Once | INTRAMUSCULAR | Status: AC
  Administered 2016-03-24: 50 mg via INTRAMUSCULAR

## 2016-03-24 MED ORDER — DIPHENHYDRAMINE HCL 50 MG/ML IJ SOLN
50.0000 mg | Freq: Once | INTRAMUSCULAR | Status: DC
  Filled 2016-03-24: qty 1

## 2016-03-24 MED ORDER — NICOTINE 21 MG/24HR TD PT24
21.0000 mg | MEDICATED_PATCH | Freq: Every day | TRANSDERMAL | Status: DC
  Administered 2016-03-25 – 2016-03-28 (×4): 21 mg via TRANSDERMAL
  Filled 2016-03-24 (×4): qty 1

## 2016-03-24 MED ORDER — LEVETIRACETAM 500 MG PO TABS
1000.0000 mg | ORAL_TABLET | Freq: Two times a day (BID) | ORAL | Status: DC
  Administered 2016-03-24: 1000 mg via ORAL
  Filled 2016-03-24: qty 2

## 2016-03-24 MED ORDER — NICOTINE 21 MG/24HR TD PT24
21.0000 mg | MEDICATED_PATCH | Freq: Every day | TRANSDERMAL | Status: DC
Start: 2016-03-24 — End: 2016-03-24
  Administered 2016-03-24: 21 mg via TRANSDERMAL
  Filled 2016-03-24: qty 1

## 2016-03-24 MED ORDER — MAGNESIUM HYDROXIDE 400 MG/5ML PO SUSP: 30.0000 mL | Freq: Every day | ORAL | Status: DC | PRN

## 2016-03-24 MED ORDER — HALOPERIDOL LACTATE 5 MG/ML IJ SOLN
5.0000 mg | Freq: Once | INTRAMUSCULAR | Status: AC
  Administered 2016-03-24: 5 mg via INTRAMUSCULAR

## 2016-03-24 MED ORDER — QUETIAPINE FUMARATE ER 300 MG PO TB24
300.0000 mg | ORAL_TABLET | Freq: Every day | ORAL | Status: DC
  Filled 2016-03-24: qty 1

## 2016-03-24 MED ORDER — QUETIAPINE FUMARATE ER 300 MG PO TB24
300.0000 mg | ORAL_TABLET | Freq: Every day | ORAL | Status: DC
  Administered 2016-03-24 – 2016-03-27 (×4): 300 mg via ORAL
  Filled 2016-03-24 (×4): qty 1

## 2016-03-24 MED ORDER — ACETAMINOPHEN 325 MG PO TABS
650.0000 mg | ORAL_TABLET | Freq: Four times a day (QID) | ORAL | Status: DC | PRN
  Administered 2016-03-28: 650 mg via ORAL
  Filled 2016-03-24: qty 2

## 2016-03-24 MED ORDER — CLONAZEPAM 0.5 MG PO TABS
1.0000 mg | ORAL_TABLET | Freq: Two times a day (BID) | ORAL | Status: DC
  Administered 2016-03-24: 1 mg via ORAL
  Filled 2016-03-24: qty 2

## 2016-03-24 MED ORDER — CLONAZEPAM 1 MG PO TABS
1.0000 mg | ORAL_TABLET | Freq: Two times a day (BID) | ORAL | Status: DC
  Administered 2016-03-24: 1 mg via ORAL
  Filled 2016-03-24: qty 1

## 2016-03-24 MED ORDER — ALUM & MAG HYDROXIDE-SIMETH 200-200-20 MG/5ML PO SUSP: 30.0000 mL | ORAL | Status: DC | PRN

## 2016-03-24 MED ORDER — RIVAROXABAN 15 MG PO TABS
15.0000 mg | ORAL_TABLET | Freq: Every day | ORAL | Status: DC
  Administered 2016-03-25 – 2016-03-27 (×3): 15 mg via ORAL
  Filled 2016-03-24 (×4): qty 1

## 2016-03-24 MED ORDER — RIVAROXABAN 15 MG PO TABS
15.0000 mg | ORAL_TABLET | Freq: Every day | ORAL | Status: DC
  Administered 2016-03-24: 15 mg via ORAL
  Filled 2016-03-24: qty 1

## 2016-03-24 NOTE — ED Notes (Signed)
Father visited pt. He got her to talk a little - said "I love you."   Pt still moving around in bed and shaking.

## 2016-03-24 NOTE — ED Notes (Addendum)
Pt's status discussed with Dr. Scotty CourtStafford. Pt is wandering and is having difficulty communicating. She answers questions with "yes" or "ok" but does not seem to have any comprehension. She will answer to her name but cannot tell me her name. When pt asked if she is in pain, she stated "yes" but did not attempt to assist this nurse to determine where she is hurting or how much. This nurse and Tommy, NT, cut her pants length off, as she is walking around and we determined that the length of the pants could be a fall risk.

## 2016-03-24 NOTE — ED Notes (Signed)
This RN was called to patient room by [employer] Cabral.  Per Officer report patient "jumped" out of bed and landed on her feet and then sat down while holding onto the railing.  Unsure if patient fell backwards or intentionally sat down.  Pt was unable to tell this RN what she needed at that time.  Officer and this RN assisted patient back to bed and Juanetta, NT came to stay with patient until patient was able to be repositioned to lay back down.  Attempted to use bed alarm, but clip will not stay on paper scrubs.  Dr. [name] notified and examined patient.  No further scans or changes to plan of care at this time.

## 2016-03-24 NOTE — ED Provider Notes (Signed)
Patient is awake and talkative. Stable for psychiatric treatment disposition   Adrienne FilbertJonathan E Torey Regan, MD 03/24/16 [phone]

## 2016-03-24 NOTE — ED Notes (Signed)
Pt given medications. She attempted to chew each tablet and had to be coached to drink water to swallow meds.

## 2016-03-24 NOTE — [facility] Counselor (Signed)
TTS called [facility] and spoke to charge RN to obtain pt's bed assignment.  Bed Assignment: 325 Attending MD: Dr. Jennet MaduroPucilowska  This information was relayed to Maralyn SagoSarah, RN [facility](Quad RN).

## 2016-03-24 NOTE — ED Notes (Signed)
Pt asking for nicotine patch. Per orders, pt not able to have one at this time. This RN informed pt of that, pt verbalized understanding. This RN also updated pt that I am awaiting a bed assignment for her downstairs and once that happens I will call report and she will be able to go downstairs. Pt verbalized understanding. Pt has no needs or complaints at this time. Will continue to monitor.

## 2016-03-24 NOTE — ED Notes (Signed)
Pt is sitting up in bed breathing audibly through her mouth. She states that she is ok and that she does not need help at this time, even though she rocks forward and backward, grimacing as if in pain. She has attempted to get out of bed, but can be redirected to stay seated in bed.

## 2016-03-24 NOTE — ED Notes (Signed)
[facility]

## 2016-03-24 NOTE — ED Notes (Signed)
Pt wandering around, leaving room. Easily redirected to return to room, but has walked into things. She does not appear to have awareness of what is going on around her.

## 2016-03-24 NOTE — Progress Notes (Signed)
TTS attempted to conduct assessment.  TTS asked [name] if she would like to complete her assessment. [name] answered yes.  At the first question, "When is your birthday?", [name] grabbed her head, started breathing heavy, and rocking.  She was seated on her mat and this continued in excess of 5 minutes.   TTS was unable to engage Ms. Pearman.

## 2016-03-24 NOTE — Consult Note (Signed)
[facility] Consult   Reason for Consult:  Consult for 35 year old woman with a long-standing history of multiple psychiatric problems who was brought into the hospital yesterday with cardiopulmonary arrest. Today he continues to be bizarre and psychotic. Referring Physician:  Joni Fears Patient Identification: [name] MRN:  [id number] Principal Diagnosis: Schizoaffective disorder [facility]) Diagnosis:   Patient Active Problem List   Diagnosis Date Noted  . Intellectual disability [F79] 03/24/2016  . Substance-induced delirium ([place]) [W09.811] 03/02/2016  . Altered mental status [R41.82] 03/01/2016  . Seizure ([place]) [R56.9] 03/01/2016  . Hypokalemia [E87.6] 03/01/2016  . Leukocytosis [D72.829] 03/01/2016  . Tobacco abuse counseling [Z71.6] 03/01/2016  . Shoulder pain, right [M25.511] 10/25/2015  . Schizoaffective disorder ([place]) [F25.9] 10/22/2015  . 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 female patient admitted with patient did not offer any information.  HPI:  Attempted to interview patient who is currently not replying verbally. Patient observed. Labs observed. Vitals result observed. Old notes and current hospitalization reviewed. I saw her briefly yesterday in the emergency room as well. 35 year old woman with a history of schizoaffective disorder intellectual disability and substance abuse was brought to the hospital yesterday after EMS was called for a cardiac arrest. Patient appeared to have overdosed on combination of narcotics and benzodiazepines. She recovered with Narcan and was stabilized in the emergency room without requiring intubation. Since that time she has remained nonverbal. When I first saw her this morning the patient was rolling on the ground grinning in a bizarre way. Later when I came back to speak with her she was staring and then would intermittently get  up and March around the room in an agitatedly catatonic manner but was never verbal. At this point it's unclear whether there was any intention to her misuse of her medicine yesterday or whether it was accidental. In any case she is clearly psychotic catatonic intermittently and unable to be cared for outside the hospital.  Social history: Patient lives with her father and brother. Does not work outside the home. Has been in unstable and somewhat volatile situation it appears for a while.  Medical history: Patient has a history of seizure disorder and is supposed to be taking Keppra regularly. She has a history of vaguely described chronic back pain for which she is prescribed a normal some amount of narcotic medication and has been so for quite a while.  Substance abuse history: Most of the substance abuse comes from inadvertently miss use of her medication. Patient is prescribed an extremely large amount of 3 different narcotic medications chronically for what seems like probably minor back pain. She is also prescribed high doses of Klonopin chronically. She has frequently overdosed either by accident or possibly intentionally. Patient does not have the intellectual ability really to participate in substance abuse programming very well.  Past Psychiatric History: Long-standing schizoaffective disorder and intellectual disability. Has frequently been seen hallucinating bizarre in her behavior agitated. Been maintained on chronic antipsychotics. Multiple psychiatric hospitalizations. Has had instances of suicide attempts and self-harm in the past. No history of violence to others. Patient is seen by Dr. Kasandra Knudsen for outpatient treatment.  Risk to Self:  multiple overdoses and miss use of medicine and some intentional suicidality Risk to Others:  none Prior Inpatient Therapy:  positive Prior Outpatient Therapy:  she sees Dr. Kasandra Knudsen in the community.  Past Medical History:  Past Medical  History:  Diagnosis Date   . Degeneration of lumbar intervertebral disc   . Seizures ([place])   . Shoulder pain, right     Past Surgical History:  Procedure Laterality Date  . CESAREAN SECTION     X2   Family History:  Family History  Problem Relation Age of Onset  . Cancer Mother    Family Psychiatric  History: She has a brother who is also psychotic although he does not have the same level of cognitive impairment. Social History:  History  Alcohol Use No     History  Drug Use No    Social History   Social History  . Marital status: Divorced    Spouse name: N/A  . Number of children: N/A  . Years of education: N/A   Occupational History  . unemployed    Social History Main Topics  . Smoking status: Current Every Day Smoker    Types: Cigarettes  . Smokeless tobacco: Never Used  . Alcohol use No  . Drug use: No  . Sexual activity: Not Asked   Other Topics Concern  . None   Social History Narrative  . None   Additional Social History:    Allergies:   Allergies  Allergen Reactions  . Latex Rash and Swelling    Other reaction(s): Unknown Skin turns red  . Morphine Other (See Comments) and Swelling    Patient states that her reaction is that her skin gets a bit red.   No urticaria or airway difficulty.    . Prednisone Other (See Comments)    Other reaction(s): Other (See Comments) Stayed up 30 days when pt. Was on prednisone insomnia  . Buprenorphine Hcl-Naloxone Hcl   . Duloxetine Other (See Comments)    Unknown  . Ketorolac Tromethamine   . Tramadol     Other reaction(s): Other (See Comments) Seizures  . Poison Ivy Extract [Poison Ivy Extract] Rash  . Poison Oak Extract [Extract Of Poison Oak] Rash  . Sumac Rash    Labs:  Results for orders placed or performed during the hospital encounter of 03/23/16 (from the past 48 hour(s))  Urine Drug Screen, Qualitative ([place] only)     Status: Abnormal   Collection Time: 03/23/16  2:41 PM  Result Value Ref Range   Tricyclic, Ur  Screen POSITIVE (A) NONE DETECTED   Amphetamines, Ur Screen NONE DETECTED NONE DETECTED   MDMA (Ecstasy)Ur Screen NONE DETECTED NONE DETECTED   Cocaine Metabolite,Ur [place] POSITIVE (A) NONE DETECTED   Opiate, Ur Screen NONE DETECTED NONE DETECTED   Phencyclidine (PCP) Ur S NONE DETECTED NONE DETECTED   Cannabinoid 50 Ng, Ur  NONE DETECTED NONE DETECTED   Barbiturates, Ur Screen NONE DETECTED NONE DETECTED   Benzodiazepine, Ur Scrn POSITIVE (A) NONE DETECTED   Methadone Scn, Ur NONE DETECTED NONE DETECTED    Comment: (NOTE) 850  Tricyclics, urine               Cutoff 1000 ng/mL 200  Amphetamines, urine             Cutoff 1000 ng/mL 300  MDMA (Ecstasy), urine           Cutoff 500 ng/mL 400  Cocaine Metabolite, urine       Cutoff 300 ng/mL 500  Opiate, urine                   Cutoff 300 ng/mL 600  Phencyclidine (PCP), urine      Cutoff 25  ng/mL 700  Cannabinoid, urine              Cutoff 50 ng/mL 800  Barbiturates, urine             Cutoff 200 ng/mL 900  Benzodiazepine, urine           Cutoff 200 ng/mL 1000 Methadone, urine                Cutoff 300 ng/mL 1100 1200 The urine drug screen provides only a preliminary, unconfirmed 1300 analytical test result and should not be used for non-medical 1400 purposes. Clinical consideration and professional judgment should 1500 be applied to any positive drug screen result due to possible 1600 interfering substances. A more specific alternate chemical method 1700 must be used in order to obtain a confirmed analytical result.  1800 Gas chromato graphy / mass spectrometry (GC/MS) is the preferred 1900 confirmatory method.   Urinalysis complete, with microscopic     Status: Abnormal   Collection Time: 03/23/16  2:44 PM  Result Value Ref Range   Color, Urine YELLOW (A) YELLOW   APPearance CLEAR (A) CLEAR   Glucose, UA NEGATIVE NEGATIVE mg/dL   Bilirubin Urine NEGATIVE NEGATIVE   Ketones, ur NEGATIVE NEGATIVE mg/dL   Specific Gravity, Urine 1.017  1.005 - 1.030   Hgb urine dipstick NEGATIVE NEGATIVE   pH 6.0 5.0 - 8.0   Protein, ur NEGATIVE NEGATIVE mg/dL   Nitrite NEGATIVE NEGATIVE   Leukocytes, UA NEGATIVE NEGATIVE   RBC / HPF 0-5 0 - 5 RBC/hpf   WBC, UA 0-5 0 - 5 WBC/hpf   Bacteria, UA NONE SEEN NONE SEEN   Squamous Epithelial / LPF 0-5 (A) NONE SEEN   Mucous PRESENT   CBC with Differential     Status: Abnormal   Collection Time: 03/23/16  3:00 PM  Result Value Ref Range   WBC 9.8 3.6 - 11.0 K/uL   RBC 4.20 3.80 - 5.20 MIL/uL   Hemoglobin 12.9 12.0 - 16.0 g/dL   HCT 36.4 35.0 - 47.0 %   MCV 86.8 80.0 - 100.0 fL   MCH 30.7 26.0 - 34.0 pg   MCHC 35.4 32.0 - 36.0 g/dL   RDW 13.5 11.5 - 14.5 %   Platelets 214 150 - 440 K/uL   Neutrophils Relative % 74 %   Neutro Abs 7.3 (H) 1.4 - 6.5 K/uL   Lymphocytes Relative 19 %   Lymphs Abs 1.9 1.0 - 3.6 K/uL   Monocytes Relative 5 %   Monocytes Absolute 0.5 0.2 - 0.9 K/uL   Eosinophils Relative 1 %   Eosinophils Absolute 0.1 0 - 0.7 K/uL   Basophils Relative 1 %   Basophils Absolute 0.1 0 - 0.1 K/uL  Comprehensive metabolic panel     Status: Abnormal   Collection Time: 03/23/16  3:00 PM  Result Value Ref Range   Sodium 139 135 - 145 mmol/L   Potassium 3.6 3.5 - 5.1 mmol/L   Chloride 112 (H) 101 - 111 mmol/L   CO2 21 (L) 22 - 32 mmol/L   Glucose, Bld 98 65 - 99 mg/dL   BUN 9 6 - 20 mg/dL   Creatinine, Ser 0.81 0.44 - 1.00 mg/dL   Calcium 8.3 (L) 8.9 - 10.3 mg/dL   Total Protein 6.7 6.5 - 8.1 g/dL   Albumin 3.3 (L) 3.5 - 5.0 g/dL   AST 12 (L) 15 - 41 U/L   ALT 8 (L) 14 - 54 U/L  Alkaline Phosphatase 69 38 - 126 U/L   Total Bilirubin 0.5 0.3 - 1.2 mg/dL   GFR calc non Af Amer >60 >60 mL/min   GFR calc Af Amer >60 >60 mL/min    Comment: (NOTE) The eGFR has been calculated using the CKD EPI equation. This calculation has not been validated in all clinical situations. eGFR's persistently <60 mL/min signify possible Chronic Kidney Disease.    Anion gap 6 5 - 15   Troponin I     Status: None   Collection Time: 03/23/16  3:00 PM  Result Value Ref Range   Troponin I <0.03 <0.03 ng/mL  Ethanol     Status: None   Collection Time: 03/23/16  3:00 PM  Result Value Ref Range   Alcohol, Ethyl (B) <5 <5 mg/dL    Comment:        LOWEST DETECTABLE LIMIT FOR SERUM ALCOHOL IS 5 mg/dL FOR MEDICAL PURPOSES ONLY   Acetaminophen level     Status: Abnormal   Collection Time: 03/23/16  3:00 PM  Result Value Ref Range   Acetaminophen (Tylenol), Serum <10 (L) 10 - 30 ug/mL    Comment:        THERAPEUTIC CONCENTRATIONS VARY SIGNIFICANTLY. A RANGE OF 10-30 ug/mL MAY BE AN EFFECTIVE CONCENTRATION FOR MANY PATIENTS. HOWEVER, SOME ARE BEST TREATED AT CONCENTRATIONS OUTSIDE THIS RANGE. ACETAMINOPHEN CONCENTRATIONS >150 ug/mL AT 4 HOURS AFTER INGESTION AND >50 ug/mL AT 12 HOURS AFTER INGESTION ARE OFTEN ASSOCIATED WITH TOXIC REACTIONS.   Salicylate level     Status: None   Collection Time: 03/23/16  3:00 PM  Result Value Ref Range   Salicylate Lvl <5.2 2.8 - 30.0 mg/dL  Glucose, capillary     Status: Abnormal   Collection Time: 03/23/16  3:12 PM  Result Value Ref Range   Glucose-Capillary 100 (H) 65 - 99 mg/dL    Current Facility-Administered Medications  Medication Dose Route Frequency Provider Last Rate Last Dose  . clonazePAM (KLONOPIN) tablet 1 mg  1 mg Oral BID Gonzella Lex, MD   1 mg at 03/24/16 1325  . haloperidol lactate (HALDOL) injection 5 mg  5 mg Intravenous Once Earleen Newport, MD      . levETIRAcetam (KEPPRA) tablet 1,000 mg  1,000 mg Oral BID Gonzella Lex, MD   1,000 mg at 03/24/16 1326  . nicotine (NICODERM CQ - dosed in mg/24 hours) patch 21 mg  21 mg Transdermal Daily Gonzella Lex, MD   21 mg at 03/24/16 1324  . oxyCODONE (OXYCONTIN) 12 hr tablet 10 mg  10 mg Oral Q12H Gonzella Lex, MD   10 mg at 03/24/16 1326  . QUEtiapine (SEROQUEL XR) 24 hr tablet 300 mg  300 mg Oral QHS Gonzella Lex, MD      . Rivaroxaban (XARELTO)  tablet 15 mg  15 mg Oral Q supper Gonzella Lex, MD       Current Outpatient Prescriptions  Medication Sig Dispense Refill  . baclofen (LIORESAL) 10 MG tablet Take 10 mg by mouth 3 (three) times daily.     . clonazePAM (KLONOPIN) 2 MG tablet Take 1 tablet (2 mg total) by mouth 2 (two) times daily. 30 tablet 0  . lactulose (CHRONULAC) 10 GM/15ML solution Take 30 mLs (20 g total) by mouth daily as needed for mild constipation. 120 mL 0  . lamoTRIgine (LAMICTAL) 25 MG tablet Take 25 mg by mouth daily.    Marland Kitchen levETIRAcetam (KEPPRA) 1000 MG tablet Take 1  tablet (1,000 mg total) by mouth 2 (two) times daily. (Patient not taking: Reported on 03/01/2016) 60 tablet 0  . meloxicam (MOBIC) 15 MG tablet Take 15 mg by mouth daily. with food  5  . nicotine (NICODERM CQ - DOSED IN MG/24 HOURS) 14 mg/24hr patch Place 1 patch (14 mg total) onto the skin daily. 28 patch 0  . oxyCODONE (OXYCONTIN) 10 mg 12 hr tablet Take 1 tablet (10 mg total) by mouth every 12 (twelve) hours. 2 tablet 0  . QUEtiapine (SEROQUEL XR) 300 MG 24 hr tablet Take 1 tablet (300 mg total) by mouth at bedtime. 8 tablet 0  . Rivaroxaban (XARELTO) 15 MG TABS tablet Take 15 mg by mouth 2 (two) times daily with a meal.      Musculoskeletal: Strength & Muscle Tone: decreased Gait & Station: normal Patient leans: N/A  Psychiatric Specialty Exam: Physical Exam  Nursing note and vitals reviewed. Constitutional: She appears well-developed and well-nourished.  HENT:  Head: Normocephalic and atraumatic.  Eyes: Conjunctivae are normal. Pupils are equal, round, and reactive to light.  Neck: Normal range of motion.  Cardiovascular: Regular rhythm and normal heart sounds.   Respiratory: Effort normal. No respiratory distress.  GI: Soft.  Musculoskeletal: Normal range of motion.  Neurological: She is alert.  Skin: Skin is warm and dry.  Psychiatric: Her affect is blunt and inappropriate. Her speech is delayed. She is withdrawn. Cognition and  memory are impaired. She expresses impulsivity and inappropriate judgment. She expresses no homicidal and no suicidal ideation. She is noncommunicative. She is inattentive.    Review of Systems  Unable to perform ROS: Patient nonverbal    Blood pressure 111/77, pulse 74, temperature 98.7 F (37.1 C), temperature source Oral, resp. rate 18, height _0  (1.6 m), weight 61.7 kg (136 lb), SpO2 97 %.Body mass index is 24.09 kg/m.  General Appearance: Disheveled  Eye Contact:  Minimal  Speech:  Blocked  Volume:  Decreased  Mood:  Negative  Affect:  Blunt  Thought Process:  NA  Orientation:  Negative  Thought Content:  Negative  Suicidal Thoughts:  Not able to answer. Came into the hospital yesterday with a near fatal overdose  Homicidal Thoughts:  No  Memory:  Immediate;   Poor Recent;   Poor Remote;   Poor  Judgement:  Impaired  Insight:  Lacking  Psychomotor Activity:  Labile. Alternates between periods of sitting still staring at the wall and sometimes rocking back and forth to sudden episodes of pacing around the room without any object.  Concentration:  Concentration: Poor  Recall:  Poor  Fund of Knowledge:  Poor  Language:  Poor  Akathisia:  No  Handed:  Right  AIMS (if indicated):     Assets:  Financial Resources/Insurance Housing Social Support  ADL's:  Impaired  Cognition:  Impaired,  Severe  Sleep:        Treatment Plan Summary: Daily contact with patient to assess and evaluate symptoms and progress in treatment, Medication management and Plan 35 year old woman with chronic severe mental health problems presented to the emergency room yesterday apparently overdosed on her medication. Unknown whether it was intentional or not. Patient's level of impairment chronically as such that she is capable of having overdosed either by accident or on purpose. Currently she presents as catatonic with intermittent periods of agitation but no threatening violence. Unable to speak or  provide any information. Patient is at chronic risk to herself. Unstable and unsafe social situation and outpatient treatment.  Requires inpatient stabilization. Orders completed to admit her to the hospital. Decreasing the dose of both her narcotics and her clonazepam but not getting rid of them entirely. Primary treatment team can work on further stabilization efforts. Coupled IVC.  Disposition: Recommend psychiatric Inpatient admission when medically cleared.  Alethia Berthold, MD 03/24/2016 4:03 PM

## 2016-03-24 NOTE — ED Notes (Signed)
Pt requested to have "the rest of her medications." When asked which ones, she included phenergan, which is not on her medication list. When asked why she takes phenergan, she stated "for seizures." This nurse went over the list of medications the patient takes, and pt appeared to be confused. Pt alert & much more responsive verbally than earlier today.

## 2016-03-24 NOTE — ED Notes (Signed)
Pt rolling around on mattress; feet up and down, moving around.

## 2016-03-25 DIAGNOSIS — F203 Undifferentiated schizophrenia: Secondary | ICD-10-CM

## 2016-03-25 LAB — LIPID PANEL
CHOLESTEROL: 202 mg/dL — AB (ref 0–200)
HDL: 55 mg/dL (ref >40)
LDL Cholesterol: 116 mg/dL — ABNORMAL HIGH (ref 0–99)
TRIGLYCERIDES: 154 mg/dL — AB (ref <150)
Total CHOL/HDL Ratio: 3.7 RATIO
VLDL: 31 mg/dL (ref 0–40)

## 2016-03-25 LAB — TSH: TSH: 1.423 u[IU]/mL (ref 0.350–4.500)

## 2016-03-25 MED ORDER — LEVETIRACETAM 500 MG PO TABS
1000.0000 mg | ORAL_TABLET | Freq: Two times a day (BID) | ORAL | Status: DC
  Administered 2016-03-25 – 2016-03-28 (×7): 1000 mg via ORAL
  Filled 2016-03-25 (×7): qty 2

## 2016-03-25 MED ORDER — OXYCODONE-ACETAMINOPHEN 5-325 MG PO TABS
1.0000 | ORAL_TABLET | Freq: Three times a day (TID) | ORAL | Status: DC | PRN
Start: 2016-03-25 — End: 2016-03-25

## 2016-03-25 MED ORDER — CHLORDIAZEPOXIDE HCL 25 MG PO CAPS
25.0000 mg | ORAL_CAPSULE | Freq: Four times a day (QID) | ORAL | Status: AC
  Administered 2016-03-25 – 2016-03-28 (×12): 25 mg via ORAL
  Filled 2016-03-25 (×12): qty 1

## 2016-03-25 MED ORDER — OXYCODONE HCL ER 10 MG PO T12A
10.0000 mg | EXTENDED_RELEASE_TABLET | Freq: Two times a day (BID) | ORAL | Status: DC
  Administered 2016-03-25 – 2016-03-28 (×7): 10 mg via ORAL
  Filled 2016-03-25 (×7): qty 1

## 2016-03-25 NOTE — Care Management (Signed)
Adrienne LeatherwoodKatherine seemed to still be a bit tired this morning when we spoke. She was willing to communicate and I let her know that if she needed counseling or reading material she need only to let us know. I will look to follow up with her after rounds later.

## 2016-03-25 NOTE — [facility] Group Notes (Signed)
[facility] LCSW Group Therapy   03/25/2016 1:00 PM   Type of Therapy: Group Therapy   Participation Level: Active   Participation Quality: Attentive, Sharing and Supportive   Affect: Appropriate   Cognitive: Alert and Oriented   Insight: Developing/Improving and Engaged   Engagement in Therapy: Developing/Improving and Engaged   Modes of Intervention: Clarification, Confrontation, Discussion, Education, Exploration, Limit-setting, Orientation, Problem-solving, Rapport Building, Dance movement psychotherapisteality Testing, Socialization and Support   Summary of Progress/Problems: The topic for today was feelings about relapse. Pt discussed what relapse prevention is to them and identified triggers that they are on the path to relapse. Pt processed their feeling towards relapse and was able to relate to peers. Pt discussed coping skills that can be used for relapse prevention.  Pt unable to participate in group discussion due to acuity.   Hampton AbbotKadijah Armaan Pond, MSW, LCSWA 03/25/2016, 2:46PM

## 2016-03-25 NOTE — [facility] Suicide Risk Assessment (Signed)
CSW attempted to contact father, [name] @ [phone]. CSW awaiting return call at this time.   Hampton AbbotKadijah Thong Feeny, MSW, LCSW-A 03/25/2016, 11:39AM
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
## 2016-03-25 NOTE — Progress Notes (Signed)
Recreation Therapy Notes  Date: 09.29.17 Time: 9:30 am Location: Craft Room  Group Topic: Problem Solving, Communication, Teamwork  Goal Area(s) Addresses:  Patient will effectively work with peer towards shared goal. Patient will identify skills used to make activity successful. Patient will identify benefit of using group skills effectively post d/c.  Behavioral Response: Intermittently Attentive, Left early  Intervention: Berkshire HathawayPipe Cleaner Tower  Activity: Patients were split into groups and given 15 pipe cleaners. They were instructed to build a free standing tower using all 15 pipe cleaners. After about 5 minutes, patients were instructed to put their dominant hand behind their back. After another 5 minutes, patients were instructed to stop talking to each other.  Education: LRT educated patients on healthy support systems.  Education Outcome: Patient left before LRT educated group.  Clinical Observations/Feedback: Patient intermittently worked on group activity. Patient left group at approximately 9:59 am and did not return to group.  Jacquelynn CreeGreene,Yvaine Jankowiak M, LRT/CTRS 03/25/2016 10:16 AM
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
## 2016-03-25 NOTE — Progress Notes (Signed)
Patient ID: Adrienne BashKatherine P Jimenez, female   DOB: 12/03/1980, 35 y.o.   MRN: [id number] PER STATE REGULATIONS 482.30  THIS CHART WAS REVIEWED FOR MEDICAL NECESSITY WITH RESPECT TO THE PATIENT'S ADMISSION/DURATION OF STAY.  NEXT REVIEW DATE:03/28/16  Loura HaltBARBARA Delquan Poucher, RN, BSN CASE MANAGER
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
## 2016-03-25 NOTE — [facility] Suicide Risk Assessment (Signed)
[facility] Admission Suicide Risk Assessment   Nursing information obtained from:    Demographic factors:    Current Mental Status:    Loss Factors:    Historical Factors:    Risk Reduction Factors:     Total Time spent with patient: 1 hour Principal Problem: Undifferentiated schizophrenia (HCC) Diagnosis:   Patient Active Problem List   Diagnosis Date Noted  . Intellectual disability [F79] 03/24/2016  . Undifferentiated schizophrenia (HCC) [F20.3] 03/24/2016  . Substance-induced delirium (HCC) [W11.914] 03/02/2016  . Altered mental status [R41.82] 03/01/2016  . Seizure (HCC) [R56.9] 03/01/2016  . Hypokalemia [E87.6] 03/01/2016  . Leukocytosis [D72.829] 03/01/2016  . Shoulder pain, right [M25.511] 10/25/2015  . Opiate abuse, episodic [F11.10] 10/22/2015   Subjective Data: suicide attempt.  Continued Clinical Symptoms:    The "Alcohol Use Disorders Identification Test", Guidelines for Use in Primary Care, Second Edition.  World Science writer [facility]). Score between 0-7:  no or low risk or alcohol related problems. Score between 8-15:  moderate risk of alcohol related problems. Score between 16-19:  high risk of alcohol related problems. Score 20 or above:  warrants further diagnostic evaluation for alcohol dependence and treatment.   CLINICAL FACTORS:   Depression:   Comorbid alcohol abuse/dependence Impulsivity Alcohol/Substance Abuse/Dependencies Schizophrenia:   Depressive state Less than 60 years old Paranoid or undifferentiated type   Musculoskeletal: Strength & Muscle Tone: within normal limits Gait & Station: normal Patient leans: N/A  Psychiatric Specialty Exam: Physical Exam  Nursing note and vitals reviewed.   Review of Systems  Psychiatric/Behavioral: Positive for depression, substance abuse and suicidal ideas.  All other systems reviewed and are negative.   Blood pressure 124/89, pulse (!) 112, temperature 99.1 F (37.3 C), temperature source Oral,  resp. rate 18, height 5\' 3"  (1.6 m), weight 59.9 kg (132 lb), SpO2 100 %.Body mass index is 23.38 kg/m.  General Appearance: Casual  Eye Contact:  Good  Speech:  Clear and Coherent  Volume:  Normal  Mood:  Depressed  Affect:  Blunt  Thought Process:  Goal Directed and Descriptions of Associations: Intact  Orientation:  Full (Time, Place, and Person)  Thought Content:  WDL  Suicidal Thoughts:  Yes.  with intent/plan  Homicidal Thoughts:  No  Memory:  Immediate;   Fair Recent;   Fair Remote;   Fair  Judgement:  Poor  Insight:  Lacking  Psychomotor Activity:  Normal  Concentration:  Concentration: Fair and Attention Span: Fair  Recall:  [employer] of Knowledge:  Fair  Language:  Fair  Akathisia:  No  Handed:  Right  AIMS (if indicated):     Assets:  Communication Skills Desire for Improvement Financial Resources/Insurance Housing Physical Health Resilience Social Support  ADL's:  Intact  Cognition:  WNL  Sleep:  Number of Hours: 6.45      COGNITIVE FEATURES THAT CONTRIBUTE TO RISK:  None    SUICIDE RISK:   Moderate:  Frequent suicidal ideation with limited intensity, and duration, some specificity in terms of plans, no associated intent, good self-control, limited dysphoria/symptomatology, some risk factors present, and identifiable protective factors, including available and accessible social support.   PLAN OF CARE: Hospital admission, medication management, and substance abuse counseling, discharge planning.  Ms. Dion is a 35 year old female with history of developmental disability, schizoaffective disorder, seizures, and substance abuse admitted after suicide attempts by narcotic and benzodiazepine overdose.  1. Suicidal ideation. The patient adamantly denies that she attempted suicide. She is able to contract for safety.  2. Schizoaffective disorder. We'll continue Seroquel XR 300 mg at bedtime.  3. Benzodiazepine misuse. The patient admits that she's been  using more clonazepam than prescribed by Dr. Janeece RiggersSu. I will put her on librium taper.  4. Opiate abuse. The patient reportedly didn't get better with Narcan but she is negative for opioids on admission. Will continue OxyContin 10 mg twice daily as prescribed by [facility].  5. Seizures. The patient is on Keppra. She reports good compliance. She had seizures 2 weeks ago. She believes that she is here because of another seizure episode. We'll continue Keppra. I will check the level tonight.  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  7. Smoking. Nicotine patch is available.  8. Substance abuse. The patient is positive for cocaine. She minimizes her problems and declines treatment.  9. Disposition. She will be discharged to home with her father. She will follow up with Dr. Janeece RiggersSu.  I certify that inpatient services furnished can reasonably be expected to improve the patient's condition.  Kristine LineaJolanta Ashonti Leandro, MD 03/25/2016, 9:23 AM

## 2016-03-25 NOTE — Tx Team (Signed)
Initial Treatment Plan 03/25/2016 4:02 AM [name] ONG:[id number]    PATIENT STRESSORS: Substance abuse   PATIENT STRENGTHS: Motivation for treatment/growth Religious Affiliation   PATIENT IDENTIFIED PROBLEMS: Psychosis  Substance abuse  Suicidal ideation                 DISCHARGE CRITERIA:  Improved stabilization in mood, thinking, and/or behavior Medical problems require only outpatient monitoring  PRELIMINARY DISCHARGE PLAN: Attend 12-step recovery group Outpatient therapy  PATIENT/FAMILY INVOLVEMENT: This treatment plan has been presented to and reviewed with the patient, Adrienne BashKatherine P Jimenez, The patient and family have been given the opportunity to ask questions and make suggestions.  Trula Orelubukola Abisola Rosa Wyly, RN 03/25/2016, 4:02 AM

## 2016-03-25 NOTE — Care Management (Signed)
Patient seemed a bit drowzy or sedated while in conversation. I introduced myself while she was in a group session. I will try to contact her later on in the day to see if she is a bit more coherent and can engage in the conversation.

## 2016-03-26 LAB — HEMOGLOBIN A1C
HEMOGLOBIN A1C: 4.7 % — AB (ref 4.8–5.6)
Mean Plasma Glucose: 88 mg/dL

## 2016-03-26 MED ORDER — DIPHENHYDRAMINE HCL 25 MG PO CAPS
25.0000 mg | ORAL_CAPSULE | ORAL | Status: DC | PRN
  Administered 2016-03-26 – 2016-03-27 (×5): 25 mg via ORAL
  Filled 2016-03-26 (×5): qty 1

## 2016-03-26 MED ORDER — PNEUMOCOCCAL VAC POLYVALENT 25 MCG/0.5ML IJ INJ
0.5000 mL | INJECTION | INTRAMUSCULAR | Status: DC
  Filled 2016-03-26: qty 0.5

## 2016-03-26 MED ORDER — INFLUENZA VAC SPLIT QUAD 0.5 ML IM SUSY
0.5000 mL | PREFILLED_SYRINGE | INTRAMUSCULAR | Status: AC
  Administered 2016-03-27: 0.5 mL via INTRAMUSCULAR
  Filled 2016-03-26: qty 0.5

## 2016-03-26 NOTE — [facility] Group Notes (Signed)
[facility] Group Notes:  (Nursing/MHT/Case Management/Adjunct)  Date:  03/26/2016  Time:  1:05 AM  Type of Therapy:  Psychoeducational Skills  Participation Level:  Did Not Attend  Summary of Progress/Problems:  Adrienne MilroyLaquanda Y Madelynne Jimenez 03/26/2016, 1:05 AM

## 2016-03-26 NOTE — Plan of Care (Signed)
Problem: Coping: Goal: Ability to verbalize frustrations and anger appropriately will improve Outcome: Not Progressing Patient is not verbalizing feelings to staff.    

## 2016-03-26 NOTE — Progress Notes (Signed)
[facility] MD Progress Note  03/26/2016 2:09 PM [name]  MRN:  [id number] Subjective:  Patient has no new complaints today. States that her mood is fine. Denies suicidal thoughts. Denies psychotic symptoms. She has not had anything like a seizure in the hospital. She is requesting to be back on more medicine but does not appear to be in pain or in any great distress. Vitals are largely unremarkable. Patient continues to maintain that her only reason to be in the hospital is from a seizure. Principal Problem: Undifferentiated schizophrenia (HCC) Diagnosis:   Patient Active Problem List   Diagnosis Date Noted  . Intellectual disability [F79] 03/24/2016  . Undifferentiated schizophrenia (HCC) [F20.3] 03/24/2016  . Substance-induced delirium (HCC) [W09.811] 03/02/2016  . Altered mental status [R41.82] 03/01/2016  . Seizure (HCC) [R56.9] 03/01/2016  . Hypokalemia [E87.6] 03/01/2016  . Leukocytosis [D72.829] 03/01/2016  . Shoulder pain, right [M25.511] 10/25/2015  . 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  Past Medical History:  Past Medical History:  Diagnosis Date  . Degeneration of lumbar intervertebral disc   . Seizures (HCC)   . Shoulder pain, right     Past Surgical History:  Procedure Laterality Date  . CESAREAN SECTION     X2   Family History:  Family History  Problem Relation Age of Onset  . Cancer Mother    Family Psychiatric  History: Family history positive for psychosis and substance abuse Social History:  History  Alcohol Use No     History  Drug Use No    Social History   Social History  . Marital status: Divorced    Spouse name: N/A  . Number of children: N/A  . Years  of education: N/A   Occupational History  . unemployed    Social History Main Topics  . Smoking status: Current Every Day Smoker    Types: Cigarettes  . Smokeless tobacco: Never Used  . Alcohol use No  . Drug use: No  . Sexual activity: Yes    Birth control/ protection: None   Other Topics Concern  . None   Social History Narrative  . None   Additional Social History:                         Sleep: Fair  Appetite:  Fair  Current Medications: Current Facility-Administered Medications  Medication Dose Route Frequency Provider Last Rate Last Dose  . acetaminophen (TYLENOL) tablet 650 mg  650 mg Oral Q6H PRN Audery Amel, MD      . alum & mag hydroxide-simeth (MAALOX/MYLANTA) 200-200-20 MG/5ML suspension 30 mL  30 mL Oral Q4H PRN Audery Amel, MD      . chlordiazePOXIDE (LIBRIUM) capsule 25 mg  25 mg Oral QID Jolanta B Pucilowska, MD   25 mg at 03/26/16 1200  . diphenhydrAMINE (BENADRYL) capsule 25 mg  25 mg Oral Q4H PRN Audery Amel, MD   25 mg at 03/26/16 1036  . [START ON 03/27/2016] Influenza vac split quadrivalent PF (FLUARIX) injection 0.5 mL  0.5 mL Intramuscular Tomorrow-1000 Jolanta B Pucilowska, MD      . levETIRAcetam (KEPPRA) tablet 1,000 mg  1,000 mg Oral BID Shari Prows, MD   1,000 mg at 03/26/16 0929  .  magnesium hydroxide (MILK OF MAGNESIA) suspension 30 mL  30 mL Oral Daily PRN Audery AmelJohn T Corabelle Spackman, MD      . nicotine (NICODERM CQ - dosed in mg/24 hours) patch 21 mg  21 mg Transdermal Daily Audery AmelJohn T Devon Kingdon, MD   21 mg at 03/26/16 0930  . oxyCODONE (OXYCONTIN) 12 hr tablet 10 mg  10 mg Oral Q12H Jolanta B Pucilowska, MD   10 mg at 03/26/16 0930  . [START ON 03/27/2016] pneumococcal 23 valent vaccine (PNU-IMMUNE) injection 0.5 mL  0.5 mL Intramuscular Tomorrow-1000 Jolanta B Pucilowska, MD      . QUEtiapine (SEROQUEL XR) 24 hr tablet 300 mg  300 mg Oral QHS Audery AmelJohn T Marwin Primmer, MD   300 mg at 03/25/16 2205  . Rivaroxaban (XARELTO) tablet 15 mg  15 mg  Oral Q supper Audery AmelJohn T Machele Deihl, MD   15 mg at 03/25/16 1720    Lab Results:  Results for orders placed or performed during the hospital encounter of 03/24/16 (from the past 48 hour(s))  Hemoglobin A1c     Status: Abnormal   Collection Time: 03/25/16  6:45 AM  Result Value Ref Range   Hgb A1c MFr Bld 4.7 (L) 4.8 - 5.6 %    Comment: (NOTE)         Pre-diabetes: 5.7 - 6.4         Diabetes: >6.4         Glycemic control for adults with diabetes: <7.0    Mean Plasma Glucose 88 mg/dL    Comment: (NOTE) [facility]formed At: [facility] [address] [place], [place] [id number] Mila HomerHancock William F MD FA:[phone]:[phone]   Lipid panel     Status: Abnormal   Collection Time: 03/25/16  6:45 AM  Result Value Ref Range   Cholesterol 202 (H) 0 - 200 mg/dL   Triglycerides 696154 (H) <150 mg/dL   HDL 55 >29>40 mg/dL   Total CHOL/HDL Ratio 3.7 RATIO   VLDL 31 0 - 40 mg/dL   LDL Cholesterol 528116 (H) 0 - 99 mg/dL    Comment:        Total Cholesterol/HDL:CHD Risk Coronary Heart Disease Risk Table                     Men   Women  1/2 Average Risk   3.4   3.3  Average Risk       5.0   4.4  2 X Average Risk   9.6   7.1  3 X Average Risk  23.4   11.0        Use the calculated Patient Ratio above and the CHD Risk Table to determine the patient's CHD Risk.        ATP III CLASSIFICATION (LDL):  <100     mg/dL   Optimal  413-244100-129  mg/dL   Near or Above                    Optimal  130-159  mg/dL   Borderline  010-272160-189  mg/dL   High  >536>190     mg/dL   Very High   TSH     Status: None   Collection Time: 03/25/16  6:45 AM  Result Value Ref Range   TSH 1.423 0.350 - 4.500 uIU/mL    Blood Alcohol level:  Lab Results  Component Value Date   [facility] <5 03/23/2016   ETH <5 02/29/2016    Metabolic Disorder Labs: Lab Results  Component  Value Date   HGBA1C 4.7 (L) 03/25/2016   MPG 88 03/25/2016   No results found for: PROLACTIN Lab Results  Component Value Date   CHOL 202 (H) 03/25/2016   TRIG 154 (H)  03/25/2016   HDL 55 03/25/2016   CHOLHDL 3.7 03/25/2016   VLDL 31 03/25/2016   LDLCALC 116 (H) 03/25/2016    Physical Findings: AIMS: Facial and Oral Movements Muscles of Facial Expression: None, normal Lips and Perioral Area: None, normal Jaw: None, normal Tongue: None, normal,Extremity Movements Upper (arms, wrists, hands, fingers): None, normal Lower (legs, knees, ankles, toes): None, normal, Trunk Movements Neck, shoulders, hips: None, normal, Overall Severity Severity of abnormal movements (highest score from questions above): None, normal Incapacitation due to abnormal movements: None, normal Patient's awareness of abnormal movements (rate only patient's report): No Awareness, Dental Status Current problems with teeth and/or dentures?: No Does patient usually wear dentures?: No  CIWA:    COWS:     Musculoskeletal: Strength & Muscle Tone: within normal limits Gait & Station: normal Patient leans: N/A  Psychiatric Specialty Exam: Physical Exam  Nursing note and vitals reviewed. Constitutional: She appears well-developed.  HENT:  Head: Normocephalic and atraumatic.  Eyes: Conjunctivae are normal. Pupils are equal, round, and reactive to light.  Neck: Normal range of motion.  Cardiovascular: Regular rhythm and normal heart sounds.   Respiratory: Effort normal. No respiratory distress.  GI: Soft.  Musculoskeletal: Normal range of motion.  Neurological: She is alert.  Skin: Skin is warm and dry.  Psychiatric: Her affect is blunt. Her speech is delayed. She is slowed and withdrawn. She expresses impulsivity. She expresses no suicidal ideation. She exhibits abnormal recent memory.    Review of Systems  Constitutional: Negative.   HENT: Negative.   Eyes: Negative.   Respiratory: Negative.   Cardiovascular: Negative.   Gastrointestinal: Negative.   Musculoskeletal: Negative.   Skin: Negative.   Neurological: Positive for seizures.  Psychiatric/Behavioral: Positive  for memory loss and substance abuse. Negative for depression, hallucinations and suicidal ideas. The patient is not nervous/anxious and does not have insomnia.     Blood pressure 127/88, pulse (!) 125, temperature 99.3 F (37.4 C), temperature source Oral, resp. rate 18, height 5\' 3"  (1.6 m), weight 59.9 kg (132 lb), SpO2 100 %.Body mass index is 23.38 kg/m.  General Appearance: Fairly Groomed  Eye Contact:  Fair  Speech:  Slow  Volume:  Decreased  Mood:  Euthymic  Affect:  Constricted  Thought Process:  Irrelevant  Orientation:  Full (Time, Place, and Person)  Thought Content:  Illogical  Suicidal Thoughts:  No  Homicidal Thoughts:  No  Memory:  Immediate;   Fair Recent;   Poor Remote;   Fair  Judgement:  Impaired  Insight:  Shallow  Psychomotor Activity:  Decreased  Concentration:  Concentration: Poor  Recall:  [employer] of Knowledge:  Fair  Language:  Fair  Akathisia:  No  Handed:  Right  AIMS (if indicated):     Assets:  Housing Resilience  ADL's:  Intact  Cognition:  Impaired,  Mild  Sleep:  Number of Hours: 7     Treatment Plan Summary: Daily contact with patient to assess and evaluate symptoms and progress in treatment, Medication management and Plan Patient is currently pretty much at her baseline and is denying any symptoms. She insists her only reason to be in the hospital was a seizure. It seems very likely to me that she was abusing multiple medications. Her home living  situation continues to appear to be a set up for repeat hospitalizations. Nevertheless patient is appearing to be grossly safe at this time. For someone who supposedly has enough pain to require the kind of an enormous dosages she get she appears to be in no discomfort whatsoever as she walks around the unit. No change to medicine for today. Likely will be ready for discharge in the next couple days. Continue seizure medicine.  Mordecai Rasmussen, MD 03/26/2016, 2:09 PM

## 2016-03-26 NOTE — Plan of Care (Signed)
Problem: Coping: Goal: Ability to verbalize feelings will improve Outcome: Progressing More verbal today.  Able to state "I feel better today"  Denies SI/HI/AVH.  No elaboration

## 2016-03-26 NOTE — Progress Notes (Signed)
D: Pt denies SI/HI/AVH, but noted responding to internal stimuli, delayed response with selective mutism noted at times. Patient is pleasant and receptive to cues, patient's thought are disorganized, affect is flat sad and depressed. Pt appears less anxious, but isolates to her room,minimal interaction with peers  A: Pt was offered support and encouragement. Pt was given scheduled medications. Pt was encouraged to attend groups. Q 15 minute checks were done for safety.  R:Pt did not attend evening group. Pt is taking medication. Pt has no complaints.Pt receptive to treatment and safety maintained on unit.

## 2016-03-26 NOTE — [facility] Group Notes (Signed)
[facility] LCSW Group Therapy  03/26/2016 2:29 PM  Type of Therapy:  Group Therapy  Participation Level:  Minimal  Participation Quality:  Attentive and Drowsy  Affect:  Flat and Lethargic  Cognitive:  Appropriate  Insight:  Limited  Engagement in Therapy:  Limited  Modes of Intervention:  Activity, Discussion, Education and Support  Summary of Progress/Problems:Balance in life: Patients will discuss the concept of balance and how it looks and feels to be unbalanced. Pt will identify areas in their life that is unbalanced and ways to become more balanced. Patient stated she needs help with pain management to improve her relationships with others.   Quanah Majka G. Garnette CzechSampson MSW, LCSWA 03/26/2016, 2:31 PM

## 2016-03-26 NOTE — Progress Notes (Signed)
Affect is brighter.  Smiles more readily.  Verbalizes that she feels better.  Continues to require intermittent redirection, gets turned around on the unit.  Difficult to understand, talks like her mouth hurts and she cannot open it.  Medication and group compliant.  Showered today and washed clothes.  Support and encouragement offered.  Safety maintained.

## 2016-03-26 NOTE — [facility] Group Notes (Signed)
[facility] Group Notes:  (Nursing/MHT/Case Management/Adjunct)  Date:  03/26/2016  Time:  10:10 PM  Type of Therapy:  Psychoeducational Skills  Participation Level:  Did Not Attend  Participation Quality:  Summary of Progress/Problems:  Mayra NeerJackie L Kyden Potash 03/26/2016, 10:10 PM

## 2016-03-27 MED ORDER — IBUPROFEN 400 MG PO TABS
400.0000 mg | ORAL_TABLET | Freq: Every day | ORAL | Status: DC
Start: 2016-03-27 — End: 2016-03-28
  Administered 2016-03-27: 400 mg via ORAL
  Filled 2016-03-27 (×2): qty 1

## 2016-03-27 NOTE — [facility] Group Notes (Signed)
[facility] Group Notes:  (Nursing/MHT/Case Management/Adjunct)  Date:  03/27/2016  Time:  9:28 PM  Type of Therapy:  Evening Wrap-up Group  Participation Level:  Active  Participation Quality:  Appropriate and Attentive  Affect:  Appropriate  Cognitive:  Alert and Appropriate  Insight:  Appropriate and Good  Engagement in Group:  Developing/Improving and Engaged  Modes of Intervention:  Discussion  Summary of Progress/Problems:  Adrienne MorrowChelsea Nanta [name] 03/27/2016, 9:28 PM

## 2016-03-27 NOTE — Progress Notes (Signed)
D: Observed pt in room lying on bed. Patient alert and oriented x4. Patient denies SI/HI/AVH. Pt affect is blunted.. When asked pt about reason for admission pt stated "I had a seizure." Pt denies any current mental health needs or concerns. Pt denied having an overdose. Pt stated her day was "really good, except when my back started to hurt." Pt denied feeling anxious or depressed. Pt woke at 5 am asking for medication for sleep. Pt c/o of chronic low back pain A: Offered active listening and support. Provided therapeutic communication. Administered scheduled medications. Informed pt that there were no sleep aids available at 5am. Encouraged pt to attend groups and actively participate in care. R: Pt pleasant and cooperative. Pt medication compliant. Will continue Q15 min. checks. Safety maintained.

## 2016-03-27 NOTE — Plan of Care (Signed)
Problem: Education: Goal: Will be free of psychotic symptoms Outcome: Progressing Alert and oriented.  Thought processes logical and coherent.

## 2016-03-27 NOTE — Plan of Care (Signed)
Problem: Education: Goal: Knowledge of the prescribed therapeutic regimen will improve Outcome: Progressing Pt expressed knowledged of medication regimen.     

## 2016-03-27 NOTE — Progress Notes (Signed)
[facility] Progress Note  03/27/2016 5:10 PM [name]  MRN:  [id number] Subjective:  Behavior continues to be fairly stable. She is taking care of her basic hygiene. She is lucid and able to have a conversation. Today we talked again about what brought her into the hospital. She continues to insist that there is nothing wrong with her controlled substances or that the amount that she takes. She actually denied that she was on fentanyl patches at all and seemed confused about the doses of some of her other medicine. I pulled up her controlled substance database in front of her and showed her that she was still having that Nell patches filled. If she is really not taking them I wonder if they are going somewhere else. In any case the patient's insight is limited. She complains of back pain a lot during the day and once more narcotics. She is not suicidal and denies any hallucinations. Principal Problem: Undifferentiated schizophrenia (HCC) Diagnosis:   Patient Active Problem List   Diagnosis Date Noted  . Intellectual disability [F79] 03/24/2016  . Undifferentiated schizophrenia (HCC) [F20.3] 03/24/2016  . Substance-induced delirium (HCC) [Y86.578][F19.921] 03/02/2016  . Altered mental status [R41.82] 03/01/2016  . Seizure (HCC) [R56.9] 03/01/2016  . Hypokalemia [E87.6] 03/01/2016  . Leukocytosis [D72.829] 03/01/2016  . Shoulder pain, right [M25.511] 10/25/2015  . 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  Past Medical History:  Past Medical History:  Diagnosis Date  . Degeneration of lumbar intervertebral disc   . Seizures (HCC)   . Shoulder pain, right     Past Surgical History:  Procedure Laterality Date  .  CESAREAN SECTION     X2   Family History:  Family History  Problem Relation Age of Onset  . Cancer Mother    Family Psychiatric  History: Family history positive for psychosis and substance abuse Social History:  History  Alcohol Use No     History  Drug Use No    Social History   Social History  . Marital status: Divorced    Spouse name: N/A  . Number of children: N/A  . Years of education: N/A   Occupational History  . unemployed    Social History Main Topics  . Smoking status: Current Every Day Smoker    Types: Cigarettes  . Smokeless tobacco: Never Used  . Alcohol use No  . Drug use: No  . Sexual activity: Yes    Birth control/ protection: None   Other Topics Concern  . None   Social History Narrative  . None   Additional Social History:                         Sleep: Fair  Appetite:  Fair  Current Medications: Current Facility-Administered Medications  Medication Dose Route Frequency Provider Last Rate Last Dose  . acetaminophen (TYLENOL) tablet 650 mg  650 mg Oral Q6H PRN Audery AmelJohn T Clapacs, MD      . alum & mag hydroxide-simeth (MAALOX/MYLANTA) 200-200-20 MG/5ML suspension 30 mL  30 mL Oral Q4H PRN Audery AmelJohn T Clapacs, MD      . chlordiazePOXIDE (LIBRIUM) capsule 25 mg  25 mg Oral QID Jolanta B Pucilowska, MD   25 mg at 03/27/16 1250  . diphenhydrAMINE (  BENADRYL) capsule 25 mg  25 mg Oral Q4H PRN Audery Amel, MD   25 mg at 03/27/16 0841  . ibuprofen (ADVIL,MOTRIN) tablet 400 mg  400 mg Oral Q lunch Audery Amel, MD   400 mg at 03/27/16 1250  . levETIRAcetam (KEPPRA) tablet 1,000 mg  1,000 mg Oral BID Jolanta B Pucilowska, MD   1,000 mg at 03/27/16 0800  . magnesium hydroxide (MILK OF MAGNESIA) suspension 30 mL  30 mL Oral Daily PRN Audery Amel, MD      . nicotine (NICODERM CQ - dosed in mg/24 hours) patch 21 mg  21 mg Transdermal Daily Audery Amel, MD   21 mg at 03/27/16 0731  . oxyCODONE (OXYCONTIN) 12 hr tablet 10 mg  10 mg Oral Q12H  Jolanta B Pucilowska, MD   10 mg at 03/27/16 0800  . pneumococcal 23 valent vaccine (PNU-IMMUNE) injection 0.5 mL  0.5 mL Intramuscular Tomorrow-1000 Jolanta B Pucilowska, MD      . QUEtiapine (SEROQUEL XR) 24 hr tablet 300 mg  300 mg Oral QHS Audery Amel, MD   300 mg at 03/26/16 2120  . Rivaroxaban (XARELTO) tablet 15 mg  15 mg Oral Q supper Audery Amel, MD   15 mg at 03/26/16 1720    Lab Results:  No results found for this or any previous visit (from the past 48 hour(s)).  Blood Alcohol level:  Lab Results  Component Value Date   [facility] <5 03/23/2016   ETH <5 02/29/2016    Metabolic Disorder Labs: Lab Results  Component Value Date   HGBA1C 4.7 (L) 03/25/2016   MPG 88 03/25/2016   No results found for: PROLACTIN Lab Results  Component Value Date   CHOL 202 (H) 03/25/2016   TRIG 154 (H) 03/25/2016   HDL 55 03/25/2016   CHOLHDL 3.7 03/25/2016   VLDL 31 03/25/2016   LDLCALC 116 (H) 03/25/2016    Physical Findings: AIMS: Facial and Oral Movements Muscles of Facial Expression: None, normal Lips and Perioral Area: None, normal Jaw: None, normal Tongue: None, normal,Extremity Movements Upper (arms, wrists, hands, fingers): None, normal Lower (legs, knees, ankles, toes): None, normal, Trunk Movements Neck, shoulders, hips: None, normal, Overall Severity Severity of abnormal movements (highest score from questions above): None, normal Incapacitation due to abnormal movements: None, normal Patient's awareness of abnormal movements (rate only patient's report): No Awareness, Dental Status Current problems with teeth and/or dentures?: No Does patient usually wear dentures?: No  CIWA:    COWS:     Musculoskeletal: Strength & Muscle Tone: within normal limits Gait & Station: normal Patient leans: N/A  Psychiatric Specialty Exam: Physical Exam  Nursing note and vitals reviewed. Constitutional: She appears well-developed.  HENT:  Head: Normocephalic and atraumatic.   Eyes: Conjunctivae are normal. Pupils are equal, round, and reactive to light.  Neck: Normal range of motion.  Cardiovascular: Regular rhythm and normal heart sounds.   Respiratory: Effort normal. No respiratory distress.  GI: Soft.  Musculoskeletal: Normal range of motion.  Neurological: She is alert.  Skin: Skin is warm and dry.  Psychiatric: Her affect is blunt. Her speech is delayed. She is slowed and withdrawn. She expresses impulsivity. She expresses no suicidal ideation. She exhibits abnormal recent memory.    Review of Systems  Constitutional: Negative.   HENT: Negative.   Eyes: Negative.   Respiratory: Negative.   Cardiovascular: Negative.   Gastrointestinal: Negative.   Musculoskeletal: Negative.   Skin: Negative.   Neurological: Positive  for seizures.  Psychiatric/Behavioral: Positive for memory loss and substance abuse. Negative for depression, hallucinations and suicidal ideas. The patient is not nervous/anxious and does not have insomnia.     Blood pressure 115/82, pulse 83, temperature 98.6 F (37 C), temperature source Oral, resp. rate 18, height 5\' 3"  (1.6 m), weight 59.9 kg (132 lb), SpO2 100 %.Body mass index is 23.38 kg/m.  General Appearance: Fairly Groomed  Eye Contact:  Fair  Speech:  Slow  Volume:  Decreased  Mood:  Euthymic  Affect:  Constricted  Thought Process:  Irrelevant  Orientation:  Full (Time, Place, and Person)  Thought Content:  Illogical  Suicidal Thoughts:  No  Homicidal Thoughts:  No  Memory:  Immediate;   Fair Recent;   Poor Remote;   Fair  Judgement:  Impaired  Insight:  Shallow  Psychomotor Activity:  Decreased  Concentration:  Concentration: Poor  Recall:  [employer] of Knowledge:  Fair  Language:  Fair  Akathisia:  No  Handed:  Right  AIMS (if indicated):     Assets:  Housing Resilience  ADL's:  Intact  Cognition:  Impaired,  Mild  Sleep:  Number of Hours: 6.75     Treatment Plan Summary: Daily contact with patient  to assess and evaluate symptoms and progress in treatment, Medication management and Plan She got pretty heated with me today arguing about her medicine but was able to ultimately keep it under good control. Didn't end up throwing a tantrum. I added some Motrin in the middle of the day for her complaints of chronic orthopedic pain. Supportive counseling and therapy. Continue tapering the Librium. Follow-up with possible discharge soon.  Mordecai Rasmussen, MD 03/27/2016, 5:10 PM

## 2016-03-27 NOTE — [facility] Suicide Risk Assessment (Signed)
[facility] INPATIENT:  Family/Significant Other Suicide Prevention Education  Suicide Prevention Education:  Contact Attempts:Jeffrey [name] (father [phone]),  has been identified by the patient as the family member/significant other with whom the patient will be residing, and identified as the person(s) who will aid the patient in the event of a mental health crisis.  With written consent from the patient, two attempts were made to provide suicide prevention education, prior to and/or following the patient's discharge.  We were unsuccessful in providing suicide prevention education.  A suicide education pamphlet was given to the patient to share with family/significant other. CSW made contact with patient's father on 03/27/16 at 4:19pm, however, the call was disconnected after about 45 seconds. CSW called father back 4x and left voicemail for father to call back.   Date and time of first attempt:  02/2916 / 11:39am ; left voicemail (documented by peer CSW on 03/25/16) Date and time of second attempt: 03/27/16 / 4:23pm; left voicemail.  Aitanna Haubner G. Garnette CzechSampson MSW, LCSWA 03/27/2016, 4:35 PM

## 2016-03-27 NOTE — [facility] Group Notes (Signed)
[facility] LCSW Group Therapy  03/27/2016 4:47 PM  Type of Therapy:  Group Therapy  Participation Level:  Active  Participation Quality:  Drowsy  Affect:  Lethargic  Cognitive:  Disorganized  Insight:  Limited  Engagement in Therapy:  Limited  Modes of Intervention:  Activity, Discussion, Education and Support  Summary of Progress/Problems:Safety Planning: Patients identified fears or worries surrounding discharge. Patients offered support to their peers and openly developed safety plans for their individual needs. Patients developed their own safety plan. Patients discussed their warning signs, coping strategies, support system with family and friends, identified mental health professionals, and how to keep their environments safe (ex. Removing unnecessary medications or removing weapons/guns). Patients then discussed their personalized safety plan with the group.    Nova Schmuhl G. Garnette CzechSampson MSW, LCSWA 03/27/2016, 4:48 PM

## 2016-03-27 NOTE — Progress Notes (Signed)
D:  Alert and oriented.  Affect blunted.  Denies SI/HI/AVH.  Verbalizes that she cussed the doctor out because he was trying to say he knew her pain.  Patient visible in the milieu and interacting with peers. Outside with peers earlier in day attempting to play basket ball.  Maintaining person care tasks appropriately.  Speech more coherent today although thought processes disorganized at times.  A:  Support and encouragement offered.  Scheduled medications administered.  R:  Receptive to treatment.  Medication and group complaint.  Safety maintained.

## 2016-03-28 LAB — LEVETIRACETAM LEVEL: Levetiracetam Lvl: 52.4 ug/mL — ABNORMAL HIGH (ref 10.0–40.0)

## 2016-03-28 MED ORDER — LEVETIRACETAM 1000 MG PO TABS: 1000.0000 mg | ORAL_TABLET | Freq: Two times a day (BID) | ORAL | 0 refills | Status: DC

## 2016-03-28 MED ORDER — QUETIAPINE FUMARATE ER 300 MG PO TB24: 300.0000 mg | ORAL_TABLET | Freq: Every day | ORAL | 1 refills | Status: DC

## 2016-03-28 NOTE — Progress Notes (Signed)
D: Observed pt in room c/o of back pain. Patient alert and oriented x4. Patient denies SI/HI/AVH. Pt affect is blunted. Pt talking about seeing her family and it improving her mood. Pt rated anxiety 4/10 and denied depression. Pt was needy this evening, coming up to the nursing station multiple times. Pt stated "I'm supposed to be on Klonopin" when anxiety was discussed. A: Offered active listening and support. Provided therapeutic communication. Administered scheduled medications. Talked about benzodiazapine potential for abuse and tolerance. Attempted to discuss patients possible overdose. R: Pt pleasant and cooperative. Pt did not acknowledge education about benzodiazapines. Pt continues to deny overdose.  Pt medication compliant. Will continue Q15 min. checks. Safety maintained.

## 2016-03-28 NOTE — Progress Notes (Signed)
Pt denies SI/HI/AVH. Pt given discharge instructions including prescriptions and f/u appointments. Pt states understanding. Pt states receipt of all belongings.   

## 2016-03-28 NOTE — [facility] Suicide Risk Assessment (Signed)
[facility] INPATIENT:  Family/Significant Other Suicide Prevention Education  Suicide Prevention Education:  Education Completed; father, Myles RosenthalJeffrey Brown ph#: [phone] has been identified by the patient as the family member/significant other with whom the patient will be residing, and identified as the person(s) who will aid the patient in the event of a mental health crisis (suicidal ideations/suicide attempt).  With written consent from the patient, the family member/significant other has been provided the following suicide prevention education, prior to the and/or following the discharge of the patient.  The suicide prevention education provided includes the following:  Suicide risk factors  Suicide prevention and interventions  National Suicide Hotline telephone number  [facility] assessment telephone number  [facility] Assistance 911  [facility] and/or [facility]idential [facility] Crisis Unit telephone number  Request made of family/significant other to:  Remove weapons (e.g., guns, rifles, knives), all items previously/currently identified as safety concern.    Remove drugs/medications (over-the-counter, prescriptions, illicit drugs), all items previously/currently identified as a safety concern.  The family member/significant other verbalizes understanding of the suicide prevention education information provided.  The family member/significant other agrees to remove the items of safety concern listed above.  Lynden OxfordKadijah R Karter Haire, MSW, LCSW-A 03/28/2016, 10:39 AM

## 2016-03-28 NOTE — Discharge Summary (Addendum)
Physician Discharge Summary Note  Patient:  [name] is an 35 y.o., female MRN:  [id number] DOB:  1980-07-30 Patient phone:  [phone] (home)  Patient address:   [place] [address],  Total Time spent with patient: 30 minutes  Date of Admission:  03/24/2016 Date of Discharge: 03/28/2016  Reason for Admission:  Overdose.   Identifying data. [name] is a 35 year old female strip schizoaffective disorder and substance abuse.  Chief complaint. "I need my Klonopin."  History of present illness. Patient was obtained from the patient and the chart. The patient has a long history of mental illness, developmental disability, seizures, and substance abuse. She has been maintained on a combination of Seroquel and higher doses clonazepam by Dr. Kasandra Knudsen. She also goes to [facility] she is prescribed OxyContin. She reports good compliance with medications. She was brought to the hospital from home after EMS was called for presumed cardiac arrest. Her father performed CPR. She was brought to the hospital and reportedly responded well to Narcan. She was however negative for opiates on admission. The patient denies that she attempted suicide but admits that she took more clonazepam. Reportedly Dr. Kasandra Knudsen describes 2 mg twice daily but she believes that she is supposed take 3 mg 3 times daily. She does admit that she also ran out of her pain medication. She has been that she is in the hospital because of seizure. She reports good compliance with Keppra but has seizures episodes nevertheless. She is not a good historian but engages easily. This is in contrast to her initial presentation when she was almost catatonic and unable to speak. This is not possible that she was indeed postictal. She denies any symptoms of depression but endorses frequent panic attacks sometimes twice daily. There helped with clonazepam. She denies other forms of anxiety. She denies auditory or  visual hallucinations or paranoia currently but admits that she was psychotic in the past. She denies symptoms suggestive of bipolar mania. She was positive for cocaine on admission but on direct questioning denies any alcohol or substance use.  Past psychiatric history. Long history of mood instability, anxiety and psychosis. She has been relatively stable on Seroquel. Dr. Kasandra Knudsen prescribes high doses of Klonopin which the patient misuses. She receives OxyContin from pain clinic which she also misuses. She was positive for cocaine on admission. She reports one previous hospitalization at [facility]. She denies suicide attempts.  Family psychiatric history. Mother with bipolar illness.  Social history. She is disabled from seizures. She lives with her father.  Principal Problem: Undifferentiated schizophrenia [facility]) Discharge Diagnoses: Patient Active Problem List   Diagnosis Date Noted  . Intellectual disability [F79] 03/24/2016  . Undifferentiated schizophrenia ([place]) [F20.3] 03/24/2016  . Substance-induced delirium ([place]) [U31.497] 03/02/2016  . Altered mental status [R41.82] 03/01/2016  . Seizure ([place]) [R56.9] 03/01/2016  . Hypokalemia [E87.6] 03/01/2016  . Leukocytosis [D72.829] 03/01/2016  . Shoulder pain, right [M25.511] 10/25/2015  . 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 Degeneration of lumbar intervertebral disc   . Seizures ([place])   . Shoulder pain, right     Past Surgical History:  Procedure Laterality Date  . CESAREAN SECTION     X2   Family History:  Family History  Problem Relation Age of Onset  . Cancer Mother    Social History:  History  Alcohol Use No     History  Drug Use No    Social History   Social History  . Marital status: Divorced    Spouse name: N/A  . Number of children: N/A  . Years of education: N/A   Occupational History  . unemployed    Social History Main Topics   . Smoking status: Current Every Day Smoker    Types: Cigarettes  . Smokeless tobacco: Never Used  . Alcohol use No  . Drug use: No  . Sexual activity: Yes    Birth control/ protection: None   Other Topics Concern  . None   Social History Narrative  . None    Hospital Course:   Ms. Dingledine is a 35 year old female with history of developmental disability, schizoaffective disorder, seizures, and substance abuse admitted after suicide attempts by narcotic and benzodiazepine overdose.  1. Suicidal ideation. This has resolved. The patient adamantly denies any thoughts, intentions, or plans to hurt herself or others. She is able to contract for safety. She is forward thinking and optimistic about the future.  2. Schizoaffective disorder. We continued Seroquel XR 300 mg at bedtime.  3. Benzodiazepine misuse. We offered Librium taper and discontinued clonazepam.   4. Opiate abuse. We continued OxyContin 10 mg twice daily as prescribed by [facility]. The patient also has Fentanyl prescribed but denies using it.   5. Seizures. The patient is on Keppra. Keppra level is still pending.  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  7. Smoking. Nicotine patch was available.  8. Substance abuse. The patient is positive for cocaine. She minimizes her problems and declines treatment. The patient claims to be allergic to naloxone, naltrexon. No rescue kit was prescribed.  9. Metabolic syndrome monitoring. Lipid profile is elevated, TSH and hemoglobin A1c are normal.    10. EKG. Sinus rhythm. QT 467.   11. Imaging. Head CT scan negative.   12. Disposition. She was discharged to home with her father. She will follow up with Dr. Kasandra Knudsen at [facility].   Physical Findings: AIMS: Facial and Oral Movements Muscles of Facial Expression: None, normal Lips and Perioral Area: None, normal Jaw: None, normal Tongue: None, normal,Extremity Movements Upper (arms, wrists,  hands, fingers): None, normal Lower (legs, knees, ankles, toes): None, normal, Trunk Movements Neck, shoulders, hips: None, normal, Overall Severity Severity of abnormal movements (highest score from questions above): None, normal Incapacitation due to abnormal movements: None, normal Patient's awareness of abnormal movements (rate only patient's report): No Awareness, Dental Status Current problems with teeth and/or dentures?: No Does patient usually wear dentures?: No  CIWA:    COWS:     Musculoskeletal: Strength & Muscle Tone: within normal limits Gait & Station: normal Patient leans: N/A  Psychiatric Specialty Exam: Physical Exam  Nursing note and vitals reviewed.   Review of Systems  Musculoskeletal: Positive for back pain.  All other systems reviewed and are negative.   Blood pressure 112/73, pulse 77, temperature 99 F (37.2 C), temperature source Oral, resp. rate 18, height 5' 3" (1.6 m), weight 59.9 kg (132 lb), SpO2 100 %.Body mass index is 23.38 kg/m.  See SRA.                                                  Sleep:  Number of Hours: 7     Have you used any  form of tobacco in the last 30 days? (Cigarettes, Smokeless Tobacco, Cigars, and/or Pipes): Yes  Has this patient used any form of tobacco in the last 30 days? (Cigarettes, Smokeless Tobacco, Cigars, and/or Pipes) Yes, Yes, A prescription for an FDA-approved tobacco cessation medication was offered at discharge and the patient refused  Blood Alcohol level:  Lab Results  Component Value Date   [facility] <5 03/23/2016   ETH <5 38/75/6433    Metabolic Disorder Labs:  Lab Results  Component Value Date   HGBA1C 4.7 (L) 03/25/2016   MPG 88 03/25/2016   No results found for: PROLACTIN Lab Results  Component Value Date   CHOL 202 (H) 03/25/2016   TRIG 154 (H) 03/25/2016   HDL 55 03/25/2016   CHOLHDL 3.7 03/25/2016   VLDL 31 03/25/2016   LDLCALC 116 (H) 03/25/2016    See Psychiatric  Specialty Exam and Suicide Risk Assessment completed by Attending Physician prior to discharge.  Discharge destination:  Home  Is patient on multiple antipsychotic therapies at discharge:  No   Has Patient had three or more failed trials of antipsychotic monotherapy by history:  No  Recommended Plan for Multiple Antipsychotic Therapies: NA  Discharge Instructions    Diet - low sodium heart healthy    Complete by:  As directed    Increase activity slowly    Complete by:  As directed        Medication List    STOP taking these medications   baclofen 10 MG tablet Commonly known as:  LIORESAL   clonazePAM 2 MG tablet Commonly known as:  KLONOPIN   lamoTRIgine 25 MG tablet Commonly known as:  LAMICTAL     TAKE these medications     Indication  lactulose 10 GM/15ML solution Commonly known as:  CHRONULAC Take 30 mLs (20 g total) by mouth daily as needed for mild constipation.  Indication:  Chronic Constipation   levETIRAcetam 1000 MG tablet Commonly known as:  KEPPRA Take 1 tablet (1,000 mg total) by mouth 2 (two) times daily.  Indication:  Tonic-Clonic Seizures   oxyCODONE 10 mg 12 hr tablet Commonly known as:  OXYCONTIN Take 1 tablet (10 mg total) by mouth every 12 (twelve) hours.  Indication:  Chronic Pain   QUEtiapine 300 MG 24 hr tablet Commonly known as:  SEROQUEL XR Take 1 tablet (300 mg total) by mouth at bedtime.  Indication:  Schizophrenia   Rivaroxaban 15 MG Tabs tablet Commonly known as:  XARELTO Take 15 mg by mouth 2 (two) times daily with a meal.  Indication:  Blood Clot in a Deep Vein     ASK your doctor about these medications     Indication  meloxicam 15 MG tablet Commonly known as:  MOBIC Take 15 mg by mouth daily. with food    nicotine 14 mg/24hr patch Commonly known as:  NICODERM CQ - dosed in mg/24 hours Place 1 patch (14 mg total) onto the skin daily.       Follow-up [place]. Go on 04/04/2016.    Why:  Please arrive to your hospital follow-up appointment with Dr. Collie Siad at 3:00PM on October 9th. Please bring your discharge paperwork to this appointment in case of medication adjustments. Contact information: [address]  [place], [place] [address] Phone: [phone] Fax: [phone]          Follow-up recommendations:  Activity:  As tolerated. Diet:  Low sodium heart healthy. Other:  Keep follow-up appointments.  Comments:    Signed: Orson Slick, MD 03/28/2016, 10:22 AM

## 2016-03-28 NOTE — Progress Notes (Signed)
  [facility] :  Will you be returning to the same living situation after discharge:  Yes,  home with dad At discharge, do you have transportation home?: Yes,  Dad will pick pt up. Do you have the ability to pay for your medications: Yes,  Medicaid/Medicare  Release of information consent forms completed and in the chart;  Patient's signature needed at discharge.  Patient to Follow up at: Follow-up Information    [facility]. Go on 04/04/2016.   Why:  Please arrive to your hospital follow-up appointment with Dr. Fannie KneeSue at 3:00PM on October 9th. Please bring your discharge paperwork to this appointment in case of medication adjustments. Contact information: [address]  [place], [place] [address] Phone: [phone] Fax: [phone]          Next level of care provider [facility]:no  Safety Planning and Suicide Prevention discussed: Yes,  SPE completed with patient and father   Have you used any form of tobacco in the last 30 days? (Cigarettes, Smokeless Tobacco, Cigars, and/or Pipes): Yes  Has patient been referred to the Quitline?: N/A patient is not a smoker  Patient has been referred for addiction treatment: Pt. refused referral  Lynden OxfordKadijah R Neshia Mckenzie, MSW, LCSW-A 03/28/2016, 10:38 AM

## 2016-03-28 NOTE — Plan of Care (Signed)
Problem: Safety: Goal: Ability to remain free from injury will improve Outcome: Progressing Pt has remained free from injury   

## 2016-03-28 NOTE — Progress Notes (Signed)
Recreation Therapy Notes  Date: 10.02.17 Time: 9:30 am Location: Craft Room  Group Topic: Self-expression  Goal Area(s) Addresses:  Patient will draw a bottle of how they see themselves. Patient will write at least one emotion they are experiencing.  Behavioral Response: Attentive, Interactive, Disruptive  Intervention: Bottled Up  Activity: Patients were instructed to draw a bottle of how they see themselves. Patients were instructed to write the emotions they were feeling on the inside of the bottle.  Education: LRT educated group on other forms of self-expression.  Education Outcome: In group clarification offered  Clinical Observations/Feedback: Patient completed activity by drawing a bottle of how she sees herself and writing her emotions inside the bottle. Patient contributed to group discussion by stating why her bottle is shaped the way it is, and what emotion is most prominent. Patient left group with social worker and when she returned she came back in repeating "yes". Patient started high fiving and fist bumping peers. LRT attempted to redirect patient. Patient difficult to redirect. Later, patient stood up and closed her eyes and put her hands in the air.  Jacquelynn CreeGreene,Rashel Okeefe M, LRT/CTRS 03/28/2016 10:27 AM

## 2016-03-28 NOTE — [facility] Suicide Risk Assessment (Signed)
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 Intellectual disability [F79] 03/24/2016  . Undifferentiated schizophrenia (HCC) [F20.3] 03/24/2016  . Substance-induced delirium (HCC) [Z61.096][F19.921] 03/02/2016  . Altered mental status [R41.82] 03/01/2016  . Seizure (HCC) [R56.9] 03/01/2016  . Hypokalemia [E87.6] 03/01/2016  . Leukocytosis [D72.829] 03/01/2016  . Shoulder pain, right [M25.511] 10/25/2015  . Opiate abuse, episodic [F11.10] 10/22/2015    Total Time spent with patient: 30 minutes  Musculoskeletal: Strength & Muscle Tone: within normal limits Gait & Station: normal Patient leans: N/A  Psychiatric Specialty Exam: Review of Systems  Musculoskeletal: Positive for back pain.  All other systems reviewed and are negative.   Blood pressure 112/73, pulse 77, temperature 99 F (37.2 C), temperature source Oral, resp. rate 18, height 5\' 3"  (1.6 m), weight 59.9 kg (132 lb), SpO2 100 %.Body mass index is 23.38 kg/m.  General Appearance: Casual  Eye Contact::  Good  Speech:  Clear and Coherent409  Volume:  Normal  Mood:  Euthymic  Affect:  Appropriate  Thought Process:  Linear  Orientation:  Full (Time, Place, and Person)  Thought Content:  WDL  Suicidal Thoughts:  No  Homicidal Thoughts:  No  Memory:  Immediate;   Poor Recent;   Poor Remote;   Poor  Judgement:  Poor  Insight:  Lacking  Psychomotor Activity:  Normal  Concentration:  Fair  Recall:  Poor  Fund of Knowledge:Poor  Language: Fair  Akathisia:  No  Handed:  Right  AIMS (if indicated):     Assets:  Communication Skills Desire for Improvement Financial Resources/Insurance Housing Physical Health Resilience Social Support  Sleep:  Number of Hours: 7  Cognition: WNL  ADL's:  Intact   Mental Status Per Nursing Assessment::   On Admission:     Demographic Factors:   Caucasian  Loss Factors: NA  Historical Factors: Prior suicide attempts and Impulsivity  Risk Reduction Factors:   Sense of responsibility to family, Living with another person, especially a relative, Positive social support and Positive therapeutic relationship  Continued Clinical Symptoms:  Alcohol/Substance Abuse/Dependencies Schizophrenia:   Depressive state Less than 33 years old Paranoid or undifferentiated type  Cognitive Features That Contribute To Risk:  None    Suicide Risk:  Minimal: No identifiable suicidal ideation.  Patients presenting with no risk factors but with morbid ruminations; may be classified as minimal risk based on the severity of the depressive symptoms  Follow-up Information    [facility].   Why:  Please arrive to your hospital follow-up appointment with Dr. Fannie KneeSue at 3:00PM on October 9th. Please bring your discharge paperwork to this appointment in case of medication adjustments. Contact information: [address]  [place], [place] [address] Phone: [phone] Fax: [phone]          Plan Of Care/Follow-up recommendations:  Activity:  As tolerated. Diet:  Low sodium heart healthy. Other:  Keep follow-up appointments.  Kristine LineaJolanta Cova Knieriem, MD 03/28/2016, 10:15 AM

## 2016-04-22 ENCOUNTER — Emergency Department
Admission: EM | Admit: 2016-04-22 | Discharge: 2016-04-25 | Disposition: A | Discharge status: Home / Self Care | Payer: Medicare Other | Attending: Emergency Medicine | Admitting: Emergency Medicine

## 2016-04-22 ENCOUNTER — Encounter: Payer: Self-pay | Admitting: Medical Oncology

## 2016-04-22 DIAGNOSIS — F203 Undifferentiated schizophrenia: Secondary | ICD-10-CM | POA: Diagnosis not present

## 2016-04-22 DIAGNOSIS — E876 Hypokalemia: Secondary | ICD-10-CM | POA: Diagnosis present

## 2016-04-22 DIAGNOSIS — F19921 Other psychoactive substance use, unspecified with intoxication with delirium: Secondary | ICD-10-CM | POA: Diagnosis present

## 2016-04-22 DIAGNOSIS — R4182 Altered mental status, unspecified: Secondary | ICD-10-CM | POA: Diagnosis present

## 2016-04-22 DIAGNOSIS — Z79899 Other long term (current) drug therapy: Secondary | ICD-10-CM | POA: Insufficient documentation

## 2016-04-22 DIAGNOSIS — F19239 Other psychoactive substance dependence with withdrawal, unspecified: Secondary | ICD-10-CM

## 2016-04-22 DIAGNOSIS — F29 Unspecified psychosis not due to a substance or known physiological condition: Secondary | ICD-10-CM | POA: Diagnosis not present

## 2016-04-22 DIAGNOSIS — R569 Unspecified convulsions: Secondary | ICD-10-CM

## 2016-04-22 DIAGNOSIS — F79 Unspecified intellectual disabilities: Secondary | ICD-10-CM

## 2016-04-22 DIAGNOSIS — F17210 Nicotine dependence, cigarettes, uncomplicated: Secondary | ICD-10-CM | POA: Insufficient documentation

## 2016-04-22 DIAGNOSIS — R05 Cough: Secondary | ICD-10-CM | POA: Insufficient documentation

## 2016-04-22 DIAGNOSIS — R791 Abnormal coagulation profile: Secondary | ICD-10-CM | POA: Insufficient documentation

## 2016-04-22 DIAGNOSIS — Z91040 Latex allergy status: Secondary | ICD-10-CM | POA: Insufficient documentation

## 2016-04-22 DIAGNOSIS — F19939 Other psychoactive substance use, unspecified with withdrawal, unspecified: Secondary | ICD-10-CM | POA: Insufficient documentation

## 2016-04-22 HISTORY — DX: Schizoaffective disorder, unspecified: F25.9

## 2016-04-22 HISTORY — DX: Deep phlebothrombosis in pregnancy, unspecified trimester: O22.30

## 2016-04-22 LAB — COMPREHENSIVE METABOLIC PANEL
ALK PHOS: 92 U/L (ref 38–126)
ALT: 12 U/L — AB (ref 14–54)
AST: 21 U/L (ref 15–41)
Albumin: 4.5 g/dL (ref 3.5–5.0)
Anion gap: 12 (ref 5–15)
BUN: 12 mg/dL (ref 6–20)
CALCIUM: 9.3 mg/dL (ref 8.9–10.3)
CHLORIDE: 104 mmol/L (ref 101–111)
CO2: 23 mmol/L (ref 22–32)
CREATININE: 0.78 mg/dL (ref 0.44–1.00)
GFR calc Af Amer: >60 mL/min (ref >60)
Glucose, Bld: 120 mg/dL — ABNORMAL HIGH (ref 65–99)
Potassium: 2.7 mmol/L — CL (ref 3.5–5.1)
Sodium: 139 mmol/L (ref 135–145)
Total Bilirubin: 0.5 mg/dL (ref 0.3–1.2)
Total Protein: 8 g/dL (ref 6.5–8.1)

## 2016-04-22 LAB — URINALYSIS COMPLETE WITH MICROSCOPIC (ARMC ONLY)
BACTERIA UA: NONE SEEN
Bilirubin Urine: NEGATIVE
Glucose, UA: NEGATIVE mg/dL
NITRITE: NEGATIVE
PH: 5 (ref 5.0–8.0)
PROTEIN: 30 mg/dL — AB
SPECIFIC GRAVITY, URINE: 1.018 (ref 1.005–1.030)

## 2016-04-22 LAB — URINE DRUG SCREEN, QUALITATIVE (ARMC ONLY)
Amphetamines, Ur Screen: NOT DETECTED
Barbiturates, Ur Screen: NOT DETECTED
Benzodiazepine, Ur Scrn: POSITIVE — AB
COCAINE METABOLITE, UR ~~LOC~~: NOT DETECTED
Cannabinoid 50 Ng, Ur ~~LOC~~: POSITIVE — AB
MDMA (ECSTASY) UR SCREEN: NOT DETECTED
METHADONE SCREEN, URINE: NOT DETECTED
OPIATE, UR SCREEN: NOT DETECTED
Phencyclidine (PCP) Ur S: NOT DETECTED
TRICYCLIC, UR SCREEN: NOT DETECTED

## 2016-04-22 LAB — CBC
HCT: 40.9 % (ref 35.0–47.0)
HEMOGLOBIN: 14.2 g/dL (ref 12.0–16.0)
MCH: 29.8 pg (ref 26.0–34.0)
MCHC: 34.6 g/dL (ref 32.0–36.0)
MCV: 86 fL (ref 80.0–100.0)
PLATELETS: 295 10*3/uL (ref 150–440)
RBC: 4.75 MIL/uL (ref 3.80–5.20)
RDW: 13.2 % (ref 11.5–14.5)
WBC: 10.7 10*3/uL (ref 3.6–11.0)

## 2016-04-22 LAB — PROTIME-INR
INR: 1.06
PROTHROMBIN TIME: 13.8 s (ref 11.4–15.2)

## 2016-04-22 LAB — ACETAMINOPHEN LEVEL: Acetaminophen (Tylenol), Serum: <10 ug/mL — ABNORMAL LOW (ref 10–30)

## 2016-04-22 LAB — APTT: aPTT: 31 seconds (ref 24–36)

## 2016-04-22 MED ORDER — RIVAROXABAN 15 MG PO TABS
ORAL_TABLET | ORAL | Status: AC
  Filled 2016-04-22: qty 1

## 2016-04-22 MED ORDER — LEVETIRACETAM 500 MG PO TABS
1000.0000 mg | ORAL_TABLET | Freq: Two times a day (BID) | ORAL | Status: DC
  Administered 2016-04-22 – 2016-04-25 (×6): 1000 mg via ORAL
  Filled 2016-04-22 (×7): qty 2

## 2016-04-22 MED ORDER — QUETIAPINE FUMARATE 200 MG PO TABS
ORAL_TABLET | ORAL | Status: AC
  Filled 2016-04-22: qty 2

## 2016-04-22 MED ORDER — LORAZEPAM 2 MG/ML IJ SOLN
INTRAMUSCULAR | Status: AC
  Administered 2016-04-22: 2 mg via INTRAMUSCULAR
  Filled 2016-04-22: qty 1

## 2016-04-22 MED ORDER — ZIPRASIDONE MESYLATE 20 MG IM SOLR
20.0000 mg | Freq: Once | INTRAMUSCULAR | Status: AC
  Administered 2016-04-22: 20 mg via INTRAMUSCULAR

## 2016-04-22 MED ORDER — RIVAROXABAN 20 MG PO TABS
20.0000 mg | ORAL_TABLET | Freq: Every day | ORAL | Status: DC
  Administered 2016-04-23 – 2016-04-24 (×2): 20 mg via ORAL
  Filled 2016-04-22 (×2): qty 1

## 2016-04-22 MED ORDER — ZIPRASIDONE MESYLATE 20 MG IM SOLR
INTRAMUSCULAR | Status: AC
  Administered 2016-04-22: 20 mg via INTRAMUSCULAR
  Filled 2016-04-22: qty 20

## 2016-04-22 MED ORDER — OXYCODONE HCL ER 10 MG PO T12A
10.0000 mg | EXTENDED_RELEASE_TABLET | Freq: Two times a day (BID) | ORAL | Status: DC
  Administered 2016-04-23 – 2016-04-25 (×4): 10 mg via ORAL
  Filled 2016-04-22 (×4): qty 1

## 2016-04-22 MED ORDER — QUETIAPINE FUMARATE 200 MG PO TABS
600.0000 mg | ORAL_TABLET | Freq: Every day | ORAL | Status: DC
  Administered 2016-04-22 – 2016-04-24 (×3): 600 mg via ORAL
  Filled 2016-04-22 (×3): qty 3

## 2016-04-22 MED ORDER — POTASSIUM CHLORIDE CRYS ER 20 MEQ PO TBCR
40.0000 meq | EXTENDED_RELEASE_TABLET | Freq: Once | ORAL | Status: AC
  Administered 2016-04-22: 40 meq via ORAL
  Filled 2016-04-22: qty 2

## 2016-04-22 MED ORDER — LORAZEPAM 2 MG/ML IJ SOLN
2.0000 mg | Freq: Once | INTRAMUSCULAR | Status: AC
  Administered 2016-04-22: 2 mg via INTRAMUSCULAR

## 2016-04-22 NOTE — BHH Counselor (Signed)
Patient still sedated; unable to complete assessment at this time.

## 2016-04-22 NOTE — ED Notes (Signed)
Patient still kicking and is very agitated. MD notified and at bedside.

## 2016-04-22 NOTE — ED Notes (Signed)
Patient thrashing around in bed. Swinging arms and kicking legs, attempting to hit staff. Patient yelling out.

## 2016-04-22 NOTE — ED Notes (Signed)
Patient placed on bedpan for attempt of urine sample.

## 2016-04-22 NOTE — ED Provider Notes (Signed)
[facility] Emergency Department Provider Note   ____________________________________________    I have reviewed the triage vital signs and the nursing notes.   HISTORY  Chief Complaint Psychiatric Evaluation  History Limited by likely psychiatric disorder   HPI [name] is a 35 y.o. female who presents with altered mental status. Patient has a history of schizophrenia and reportedly has been noncompliant with her medications. She presents  violent/aggressive and screaming at staff. Unable to obtain further history at this time.   Past Medical History:  Diagnosis Date  . Degeneration of lumbar intervertebral disc   . Schizoaffective disorder (HCC)   . Seizures (HCC)   . Shoulder pain, right     Patient Active Problem List   Diagnosis Date Noted  . Intellectual disability 03/24/2016  . 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 Substance-induced delirium (HCC) 03/02/2016  . Altered mental status 03/01/2016  . Seizure (HCC) 03/01/2016  . Hypokalemia 03/01/2016  . Leukocytosis 03/01/2016  . Shoulder pain, right 10/25/2015  . Opiate abuse, episodic 10/22/2015    Past Surgical History:  Procedure Laterality Date  . CESAREAN SECTION     X2    Prior to Admission medications   Medication Sig Start Date End Date Taking? Authorizing Provider  baclofen (LIORESAL) 10 MG tablet Take 10 mg by mouth 3 (three) times daily.   Yes Historical Provider, MD  drospirenone-ethinyl estradiol (YASMIN,ZARAH,SYEDA) 3-0.03 MG tablet Take 1 tablet by mouth daily.   Yes Historical Provider, MD  meloxicam (MOBIC) 15 MG tablet Take 15 mg by mouth daily. with food 09/02/15  Yes Historical Provider, MD  naloxegol oxalate (MOVANTIK) 25 MG TABS tablet Take 25 mg by mouth daily. On empty stomach   Yes Historical Provider, MD  oxyCODONE (OXYCONTIN) 10 mg 12 hr tablet Take 1 tablet (10 mg total) by mouth every 12 (twelve) hours. 01/25/16  Yes Payton Mccallumrlando Conty,  MD  PARoxetine (PAXIL) 20 MG tablet Take 20 mg by mouth at bedtime.   Yes Historical Provider, MD  QUEtiapine (SEROQUEL XR) 300 MG 24 hr tablet Take 1 tablet (300 mg total) by mouth at bedtime. Patient taking differently: Take 600 mg by mouth at bedtime.  03/28/16  Yes Jolanta B Pucilowska, MD  levETIRAcetam (KEPPRA) 1000 MG tablet Take 1 tablet (1,000 mg total) by mouth 2 (two) times daily. 03/28/16   Shari ProwsJolanta B Pucilowska, MD  nicotine (NICODERM CQ - DOSED IN MG/24 HOURS) 14 mg/24hr patch Place 1 patch (14 mg total) onto the skin daily. 03/01/16   Katharina Caperima Vaickute, MD  Rivaroxaban (XARELTO) 15 MG TABS tablet Take 20 mg by mouth daily with supper.     Historical Provider, MD     Allergies Latex; Morphine; Prednisone; Buprenorphine hcl-naloxone hcl; Duloxetine; Ketorolac tromethamine; Tramadol; Poison ivy extract [poison ivy extract]; Poison oak extract [poison oak extract]; and Sumac  Family History  Problem Relation Age of Onset  . Cancer Mother     Social History Social History  Substance Use Topics  . Smoking status: Current Every Day Smoker    Types: Cigarettes  . Smokeless tobacco: Never Used  . Alcohol use No    Level V caveat: Unable to obtain Review of Systems due to altered mental status    ____________________________________________   PHYSICAL EXAM:  VITAL SIGNS: ED Triage Vitals  Enc Vitals Group     BP 04/22/16 1518 (!) 128/99     Pulse Rate 04/22/16 1518 (!) 110     Resp 04/22/16 1518 20  Temp 04/22/16 1518 97.6 F (36.4 C)     Temp Source 04/22/16 1518 Axillary     SpO2 04/22/16 1518 99 %     Weight 04/22/16 1520 136 lb (61.7 kg)     Height 04/22/16 1520 5\' 3"  (1.6 m)     Head Circumference --      Peak Flow --      Pain Score 04/22/16 1520 0     Pain Loc --      Pain Edu? --      Excl. in GC? --     Constitutional: Alert and disoriented, aggressive, screaming, trying to hit staff Eyes: Conjunctivae are normal.  Head: No apparent trauma Nose: No  epistaxis or swelling Mouth/Throat: Mucous membranes are moist.    Cardiovascular: Tachycardia, regular rhythm.   Good peripheral circulation. Respiratory: Normal respiratory effort.  No retractions. Lungs CTAB. Gastrointestinal: Soft and nontender. No distention.   Genitourinary: deferred Musculoskeletal: No lower extremity injury. Warm and well perfused Neurologic:   No gross focal neurologic deficits are appreciated.  Skin:  Skin is warm, dry and intact. No rash noted. Psychiatric: Violent, aggressive, grossly abnormal behavior  ____________________________________________   LABS (all labs ordered are listed, but only abnormal results are displayed)  Labs Reviewed  COMPREHENSIVE METABOLIC PANEL - Abnormal; Notable for the following:       Result Value   Potassium 2.7 (*)    Glucose, Bld 120 (*)    ALT 12 (*)    All other components within normal limits  ACETAMINOPHEN LEVEL - Abnormal; Notable for the following:    Acetaminophen (Tylenol), Serum <10 (*)    All other components within normal limits  URINE DRUG SCREEN, QUALITATIVE ([facility] ONLY) - Abnormal; Notable for the following:    Cannabinoid 50 Ng, Ur Tangent POSITIVE (*)    Benzodiazepine, Ur Scrn POSITIVE (*)    All other components within normal limits  URINALYSIS COMPLETEWITH MICROSCOPIC ([facility] ONLY) - Abnormal; Notable for the following:    Color, Urine YELLOW (*)    APPearance CLEAR (*)    Ketones, ur TRACE (*)    Hgb urine dipstick 1+ (*)    Protein, ur 30 (*)    Leukocytes, UA TRACE (*)    Squamous Epithelial / LPF 0-5 (*)    All other components within normal limits  CBC  PROTIME-INR  APTT   ____________________________________________  EKG  None ____________________________________________  RADIOLOGY  None ____________________________________________   PROCEDURES  Procedure(s) performed: No    Critical Care performed:No ____________________________________________   INITIAL IMPRESSION /  ASSESSMENT AND PLAN / ED COURSE  Pertinent labs & imaging results that were available during my care of the patient were reviewed by me and considered in my medical decision making (see chart for details).  Given patient's aggressive behavior and danger to staff, ordered Geodon 20 mg IM for staff's protection.  Clinical Course  ----------------------------------------- 3:45 PM on 04/22/2016 -----------------------------------------  Patient continues to try to kick herself and staff even after Geodon. 2 mg IM Ativan ordered ____________________________________________ ----------------------------------------- 4:32 PM on 04/22/2016 -----------------------------------------  Patient calm, sleeping, cardiac monitor  Patient under IVC, pending psychiatric evaluation and disposition  ----------------------------------------- 9:19 PM on 04/22/2016 -----------------------------------------  Patient resting quietly and calmly vitals unremarkable  FINAL CLINICAL IMPRESSION(S) / ED DIAGNOSES  Final diagnoses:  Psychosis, unspecified psychosis type      NEW MEDICATIONS STARTED DURING THIS VISIT:  New Prescriptions   No medications on file     Note:  This document  was prepared using Conservation officer, historic buildings and may include unintentional dictation errors.    Jene Every, MD 04/22/16 2119

## 2016-04-22 NOTE — ED Notes (Signed)
In and out cath completed per verbal order from Dr. Cyril LoosenKinner. Completed by this RN, assisted by Clinton SawyerKailey, RN. Patient tolerated well. 200cc of clear, amber urine returned. Sterile technique maintained.

## 2016-04-22 NOTE — ED Notes (Signed)
Patient is still moving extremities but is resting much more comfortably.

## 2016-04-22 NOTE — ED Notes (Signed)
Patient is resting comfortably at this time. Eyes closed, even and non labored respirations noted. NSR on monitor.

## 2016-04-22 NOTE — Consult Note (Signed)
[facility] Consult   Reason for Consult:  Consult for 35 year old woman with a history of schizophrenia as well as intellectual disability and substance abuse disorder. Brought in agitated from home. Referring Physician:  Corky Downs Patient Identification: [name] MRN:  [id number] Principal Diagnosis: Undifferentiated schizophrenia [facility]) Diagnosis:   Patient Active Problem List   Diagnosis Date Noted  . Intellectual disability [F79] 03/24/2016  . Undifferentiated schizophrenia ([place]) [F20.3] 03/24/2016  . Substance-induced delirium ([place]) [F81.829] 03/02/2016  . Altered mental status [R41.82] 03/01/2016  . Seizure ([place]) [R56.9] 03/01/2016  . Hypokalemia [E87.6] 03/01/2016  . Leukocytosis [D72.829] 03/01/2016  . Shoulder pain, right [M25.511] 10/25/2015  . Opiate abuse, episodic [F11.10] 10/22/2015    Total Time spent with patient: 45 minutes  Subjective:   [name] is a 35 y.o. female patient admitted with patient is currently not able to answer any questions.  HPI:  35 year old woman well known to the psychiatric service brought in from home after becoming agitated. Little information currently available. Apparently she was agitated and aggressive with law enforcement coming in. There is a report from her family that she had not been on her medicine. Patient has been given some medicine since coming into the emergency room and is currently knocked out and unable to answer questions. Blood tests back show low potassium which is pretty typical for her. Doesn't appear right now to be having any signs of active seizure disorder. Drug screen not back.  Social history: Patient with chronic mental illness lives with her father. Does not work. Disabled.  Medical history: History of seizure disorder opiate abuse chronic reports of back pain.  Substance abuse history: Clear history of abuse of prescription medicines  Past Psychiatric History: Multiple  hospitalizations usually psychotic often after overdosing on medicines or stopping her psychiatric medicine. Has a history of aggression when psychotic also some self injury at some points in the past. Chronic noncompliance with treatment. Multiple prior hospitalizations. She was just discharged at the beginning of October.  Risk to Self: Is patient at risk for suicide?: No Risk to Others:   Prior Inpatient Therapy:   Prior Outpatient Therapy:    Past Medical History:  Past Medical History:  Diagnosis Date  . Degeneration of lumbar intervertebral disc   . Schizoaffective disorder ([place])   . Seizures ([place])   . Shoulder pain, right     Past Surgical History:  Procedure Laterality Date  . CESAREAN SECTION     X2   Family History:  Family History  Problem Relation Age of Onset  . Cancer Mother    Family Psychiatric  History: Brother has a psychotic disorder as well Social History:  History  Alcohol Use No     History  Drug Use No    Social History   Social History  . Marital status: Divorced    Spouse name: N/A  . Number of children: N/A  . Years of education: N/A   Occupational History  . unemployed    Social History Main Topics  . Smoking status: Current Every Adrienne Smoker    Types: Cigarettes  . Smokeless tobacco: Never Used  . Alcohol use No  . Drug use: No  . Sexual activity: Yes    Birth control/ protection: None   Other Topics Concern  . None   Social History Narrative  . None   Additional Social History:    Allergies:   Allergies  Allergen Reactions  . Latex Rash and Swelling  Other reaction(s): Unknown Skin turns red  . Morphine Other (See Comments) and Swelling    Patient states that her reaction is that her skin gets a bit red.   No urticaria or airway difficulty.    . Prednisone Other (See Comments)    Other reaction(s): Other (See Comments) Stayed up 30 days when pt. Was on prednisone insomnia  . Buprenorphine Hcl-Naloxone Hcl   .  Duloxetine Other (See Comments)    Unknown  . Ketorolac Tromethamine   . Tramadol     Other reaction(s): Other (See Comments) Seizures  . Poison Ivy Extract [Poison Ivy Extract] Rash  . Poison Oak Extract [Poison Oak Extract] Rash  . Sumac Rash    Labs:  Results for orders placed or performed during the hospital encounter of 04/22/16 (from the past 48 hour(s))  CBC     Status: None   Collection Time: 04/22/16  4:10 PM  Result Value Ref Range   WBC 10.7 3.6 - 11.0 K/uL   RBC 4.75 3.80 - 5.20 MIL/uL   Hemoglobin 14.2 12.0 - 16.0 g/dL   HCT 40.9 35.0 - 47.0 %   MCV 86.0 80.0 - 100.0 fL   MCH 29.8 26.0 - 34.0 pg   MCHC 34.6 32.0 - 36.0 g/dL   RDW 13.2 11.5 - 14.5 %   Platelets 295 150 - 440 K/uL  Comprehensive metabolic panel     Status: Abnormal   Collection Time: 04/22/16  4:10 PM  Result Value Ref Range   Sodium 139 135 - 145 mmol/L   Potassium 2.7 (LL) 3.5 - 5.1 mmol/L    Comment: CRITICAL RESULT CALLED TO, READ BACK BY AND VERIFIED WITH EMMA HUNTER AT 1653 04/22/2016 BY TFK.    Chloride 104 101 - 111 mmol/L   CO2 23 22 - 32 mmol/L   Glucose, Bld 120 (H) 65 - 99 mg/dL   BUN 12 6 - 20 mg/dL   Creatinine, Ser 0.78 0.44 - 1.00 mg/dL   Calcium 9.3 8.9 - 10.3 mg/dL   Total Protein 8.0 6.5 - 8.1 g/dL   Albumin 4.5 3.5 - 5.0 g/dL   AST 21 15 - 41 U/L   ALT 12 (L) 14 - 54 U/L   Alkaline Phosphatase 92 38 - 126 U/L   Total Bilirubin 0.5 0.3 - 1.2 mg/dL   GFR calc non Af Amer >60 >60 mL/min   GFR calc Af Amer >60 >60 mL/min    Comment: (NOTE) The eGFR has been calculated using the CKD EPI equation. This calculation has not been validated in all clinical situations. eGFR's persistently <60 mL/min signify possible Chronic Kidney Disease.    Anion gap 12 5 - 15  Protime-INR     Status: None   Collection Time: 04/22/16  4:10 PM  Result Value Ref Range   Prothrombin Time 13.8 11.4 - 15.2 seconds   INR 1.06   APTT     Status: None   Collection Time: 04/22/16  4:10 PM   Result Value Ref Range   aPTT 31 24 - 36 seconds  Acetaminophen level     Status: Abnormal   Collection Time: 04/22/16  4:10 PM  Result Value Ref Range   Acetaminophen (Tylenol), Serum <10 (L) 10 - 30 ug/mL    Comment:        THERAPEUTIC CONCENTRATIONS VARY SIGNIFICANTLY. A RANGE OF 10-30 ug/mL MAY BE AN EFFECTIVE CONCENTRATION FOR MANY PATIENTS. HOWEVER, SOME ARE BEST TREATED AT CONCENTRATIONS OUTSIDE THIS RANGE. ACETAMINOPHEN CONCENTRATIONS >  150 ug/mL AT 4 HOURS AFTER INGESTION AND >50 ug/mL AT 12 HOURS AFTER INGESTION ARE OFTEN ASSOCIATED WITH TOXIC REACTIONS.     Current Facility-Administered Medications  Medication Dose Route Frequency Provider Last Rate Last Dose  . levETIRAcetam (KEPPRA) tablet 1,000 mg  1,000 mg Oral BID Gonzella Lex, MD      . oxyCODONE (OXYCONTIN) 12 hr tablet 10 mg  10 mg Oral Q12H Zaron Zwiefelhofer T Lastacia Solum, MD      . QUEtiapine (SEROQUEL) tablet 300 mg  300 mg Oral QHS Gonzella Lex, MD      . Derrill Memo ON 04/23/2016] Rivaroxaban (XARELTO) tablet 15 mg  15 mg Oral BID AC Gonzella Lex, MD       Current Outpatient Prescriptions  Medication Sig Dispense Refill  . lactulose (CHRONULAC) 10 GM/15ML solution Take 30 mLs (20 g total) by mouth daily as needed for mild constipation. 120 mL 0  . levETIRAcetam (KEPPRA) 1000 MG tablet Take 1 tablet (1,000 mg total) by mouth 2 (two) times daily. 60 tablet 0  . meloxicam (MOBIC) 15 MG tablet Take 15 mg by mouth daily. with food  5  . nicotine (NICODERM CQ - DOSED IN MG/24 HOURS) 14 mg/24hr patch Place 1 patch (14 mg total) onto the skin daily. 28 patch 0  . oxyCODONE (OXYCONTIN) 10 mg 12 hr tablet Take 1 tablet (10 mg total) by mouth every 12 (twelve) hours. 2 tablet 0  . QUEtiapine (SEROQUEL XR) 300 MG 24 hr tablet Take 1 tablet (300 mg total) by mouth at bedtime. 30 tablet 1  . Rivaroxaban (XARELTO) 15 MG TABS tablet Take 15 mg by mouth 2 (two) times daily with a meal.      Musculoskeletal: Strength & Muscle Tone:  within normal limits Gait & Station: unable to stand Patient leans: N/A  Psychiatric Specialty Exam: Physical Exam  Nursing note and vitals reviewed. Constitutional: She appears well-developed.  HENT:  Head: Normocephalic and atraumatic.  Eyes: Conjunctivae are normal. Pupils are equal, round, and reactive to light.  Neck: Normal range of motion.  Cardiovascular: Regular rhythm and normal heart sounds.   Respiratory: Effort normal. No respiratory distress.  GI: Soft.  Musculoskeletal: Normal range of motion.  Neurological: She is alert.  Skin: Skin is warm. She is diaphoretic.  Psychiatric: Her affect is blunt. Her speech is delayed. She is withdrawn. Cognition and memory are impaired.    Review of Systems  Unable to perform ROS: Patient unresponsive    Blood pressure 98/74, pulse 88, temperature 97.6 F (36.4 C), temperature source Axillary, resp. rate 17, height 5' 3"  (1.6 m), weight 61.7 kg (136 lb), SpO2 97 %.Body mass index is 24.09 kg/m.  General Appearance: Disheveled  Eye Contact:  None  Speech:  Negative  Volume:  Decreased  Mood:  Negative  Affect:  Negative  Thought Process:  NA  Orientation:  Negative  Thought Content:  Negative  Suicidal Thoughts:  No  Homicidal Thoughts:  No  Memory:  Negative  Judgement:  Negative  Insight:  Negative  Psychomotor Activity:  Negative  Concentration:  Concentration: Poor  Recall:  Negative  Fund of Knowledge:  Negative  Language:  Negative  Akathisia:  Negative  Handed:  Right  AIMS (if indicated):     Assets:  Social Support  ADL's:  Impaired  Cognition:  Impaired,  Mild  Sleep:        Treatment Plan Summary: Medication management and Plan 35 year old woman with schizophrenia seizure disorder substance abuse.  Full scope of the current situation not yet entirely clear. I put in orders to restart her on her prescription medications including her anticoagulant that she takes for a history of blood clots. Seroquel 300  at night Keppra 1000 twice a Adrienne. Also reordered OxyContin 10 mg every 12 hours which is a lower dose than what she had gotten outpatient but was what we discharged her on last time. Patient can be reevaluated over the weekend and decision made about admission versus discharge depending on what she is like when she wakes up.  Disposition: Supportive therapy provided about ongoing stressors.  Alethia Berthold, MD 04/22/2016 6:34 PM

## 2016-04-22 NOTE — ED Notes (Signed)
Dr. Cyril LoosenKinner updated on patients status, immediately at bedside.

## 2016-04-22 NOTE — ED Triage Notes (Signed)
Pt from home with dad who reports pt has been off of her psych meds for a couple weeks and has been acting irrational. Pt attempted to jump out of car with dad multiple times on way to er. Pt acting out upon arrival to triage, swinging at staff and yelling.

## 2016-04-22 NOTE — ED Notes (Addendum)
Attempting to place patient on cardiac monitor.

## 2016-04-23 ENCOUNTER — Emergency Department: Payer: Medicare Other

## 2016-04-23 ENCOUNTER — Encounter: Payer: Self-pay | Admitting: Emergency Medicine

## 2016-04-23 DIAGNOSIS — F29 Unspecified psychosis not due to a substance or known physiological condition: Secondary | ICD-10-CM | POA: Diagnosis not present

## 2016-04-23 LAB — COMPREHENSIVE METABOLIC PANEL
ALK PHOS: 98 U/L (ref 38–126)
ALT: 13 U/L — AB (ref 14–54)
AST: 19 U/L (ref 15–41)
Albumin: 4.7 g/dL (ref 3.5–5.0)
Anion gap: 11 (ref 5–15)
BUN: 11 mg/dL (ref 6–20)
CALCIUM: 9.3 mg/dL (ref 8.9–10.3)
CO2: 25 mmol/L (ref 22–32)
CREATININE: 0.95 mg/dL (ref 0.44–1.00)
Chloride: 103 mmol/L (ref 101–111)
Glucose, Bld: 83 mg/dL (ref 65–99)
Potassium: 3.1 mmol/L — ABNORMAL LOW (ref 3.5–5.1)
Sodium: 139 mmol/L (ref 135–145)
Total Bilirubin: 0.6 mg/dL (ref 0.3–1.2)
Total Protein: 8.7 g/dL — ABNORMAL HIGH (ref 6.5–8.1)

## 2016-04-23 LAB — TROPONIN I: TROPONIN I: 0 ng/mL (ref <0.03)

## 2016-04-23 LAB — CBC WITH DIFFERENTIAL/PLATELET
BASOS ABS: 0.1 10*3/uL (ref 0–0.1)
BASOS PCT: 1 %
EOS ABS: 0.2 10*3/uL (ref 0–0.7)
Eosinophils Relative: 3 %
HCT: 45.8 % (ref 35.0–47.0)
HEMOGLOBIN: 15.4 g/dL (ref 12.0–16.0)
Lymphocytes Relative: 46 %
Lymphs Abs: 3 10*3/uL (ref 1.0–3.6)
MCH: 29.7 pg (ref 26.0–34.0)
MCHC: 33.6 g/dL (ref 32.0–36.0)
MCV: 88.2 fL (ref 80.0–100.0)
Monocytes Absolute: 0.5 10*3/uL (ref 0.2–0.9)
Monocytes Relative: 7 %
NEUTROS PCT: 43 %
Neutro Abs: 2.8 10*3/uL (ref 1.4–6.5)
Platelets: 255 10*3/uL (ref 150–440)
RBC: 5.19 MIL/uL (ref 3.80–5.20)
RDW: 13.5 % (ref 11.5–14.5)
WBC: 6.6 10*3/uL (ref 3.6–11.0)

## 2016-04-23 LAB — PREGNANCY, URINE: PREG TEST UR: NEGATIVE

## 2016-04-23 MED ORDER — LORAZEPAM 2 MG/ML IJ SOLN
1.0000 mg | Freq: Once | INTRAMUSCULAR | Status: AC
  Administered 2016-04-23: 1 mg via INTRAVENOUS
  Filled 2016-04-23: qty 1

## 2016-04-23 MED ORDER — NICOTINE 21 MG/24HR TD PT24
MEDICATED_PATCH | TRANSDERMAL | Status: AC
  Administered 2016-04-23: 21 mg via TRANSDERMAL
  Filled 2016-04-23: qty 1

## 2016-04-23 MED ORDER — NICOTINE 21 MG/24HR TD PT24
21.0000 mg | MEDICATED_PATCH | Freq: Once | TRANSDERMAL | Status: AC
  Administered 2016-04-23: 21 mg via TRANSDERMAL

## 2016-04-23 MED ORDER — SODIUM CHLORIDE 0.9 % IV BOLUS (SEPSIS)
1000.0000 mL | Freq: Once | INTRAVENOUS | Status: AC
  Administered 2016-04-23: 1000 mL via INTRAVENOUS

## 2016-04-23 MED ORDER — LORAZEPAM 1 MG PO TABS
ORAL_TABLET | ORAL | Status: AC
  Administered 2016-04-23: 1 mg via ORAL
  Filled 2016-04-23: qty 1

## 2016-04-23 MED ORDER — IOPAMIDOL (ISOVUE-370) INJECTION 76%
75.0000 mL | Freq: Once | INTRAVENOUS | Status: AC | PRN
  Administered 2016-04-23: 75 mL via INTRAVENOUS

## 2016-04-23 MED ORDER — HALOPERIDOL LACTATE 5 MG/ML IJ SOLN
5.0000 mg | Freq: Four times a day (QID) | INTRAMUSCULAR | Status: DC | PRN
  Administered 2016-04-23 – 2016-04-24 (×3): 5 mg via INTRAVENOUS
  Filled 2016-04-23 (×3): qty 1

## 2016-04-23 MED ORDER — POTASSIUM CHLORIDE CRYS ER 20 MEQ PO TBCR
40.0000 meq | EXTENDED_RELEASE_TABLET | Freq: Once | ORAL | Status: AC
  Administered 2016-04-23: 20 meq via ORAL
  Filled 2016-04-23: qty 2

## 2016-04-23 MED ORDER — LORAZEPAM 1 MG PO TABS
1.0000 mg | ORAL_TABLET | Freq: Four times a day (QID) | ORAL | Status: DC | PRN
  Administered 2016-04-23 – 2016-04-25 (×3): 1 mg via ORAL
  Filled 2016-04-23 (×3): qty 1

## 2016-04-23 NOTE — BH Assessment (Signed)
Assessment Note  [name] is an 35 y.o. female who presents to the ER via her father Leotis Shames [phone]) due to having odd and bizarre behaviors. He also reports she hasn't slept in several days. The patient has stop taking her medications and he is unsure as to how long it has been. At the beginning of this month (October), [facility] stopped [facility] her opioids. Her psychiatrist, Dr. [name] [facility], stopped prescribing her Klonopin. Per father's report, they stopped the prescriptions because she was positive for cocaine.  Patient is diagnosed with schizophrenia and have history of noncompliance with her psychotropic medications. Upon arrival (04/22/2016) to the ER, patient was psychotic and unable to redirect. She was observed responding to internal stimuli. Initially there were concerns for her medical needs. After "medical workup" their were no concerns and patient was medically cleared. On today (04/23/2016), she continues to have psychosis. Her thoughts are disorganized and incoherent.  Diagnosis: Schizophrenia  Past Medical History:  Past Medical History:  Diagnosis Date  . Degeneration of lumbar intervertebral disc   . DVT (deep vein thrombosis) in pregnancy ([facility])   . Schizoaffective disorder ([facility])   . Seizures ([facility])   . Shoulder pain, right     Past Surgical History:  Procedure Laterality Date  . CESAREAN SECTION     X2    Family History:  Family History  Problem Relation Age of Onset  . Cancer Mother     Social History:  reports that she has been smoking Cigarettes.  She has never used smokeless tobacco. She reports that she does not drink alcohol or use drugs.  Additional Social History:  Alcohol / Drug Use Pain Medications: See PTA Prescriptions: See PTA Over the Counter: See PTA History of alcohol / drug use?:  (Patient psychotic) Longest period of sobriety (when/how long): Unknown Negative Consequences of  Use:  (Unknown) Withdrawal Symptoms:  (None Reported)  CIWA: CIWA-Ar BP: 97/62 Pulse Rate: 95 COWS:    Allergies:  Allergies  Allergen Reactions  . Latex Rash and Swelling    Other reaction(s): Unknown Skin turns red  . Morphine Other (See Comments) and Swelling    Patient states that her reaction is that her skin gets a bit red.   No urticaria or airway difficulty.    . Prednisone Other (See Comments)    Other reaction(s): Other (See Comments) Stayed up 30 days when pt. Was on prednisone insomnia  . Buprenorphine Hcl-Naloxone Hcl   . Duloxetine Other (See Comments)    Unknown  . Ketorolac Tromethamine   . Tramadol     Other reaction(s): Other (See Comments) Seizures  . Poison Ivy Extract [Poison Ivy Extract] Rash  . Poison Oak Extract [Poison Oak Extract] Rash  . Sumac Rash    Home Medications:  (Not in a hospital admission)  OB/GYN Status:  No LMP recorded. Patient is not currently having periods (Reason: Other).  General Assessment Data Location of Assessment: [facility] ED TTS Assessment: In system Is this a Tele or Face-to-Face Assessment?: Face-to-Face Is this an Initial Assessment or a Re-assessment for this encounter?: Initial Assessment Marital status: Single Maiden name: n/a Is patient pregnant?: No Pregnancy Status: No Living Arrangements: Parent Can pt return to current living arrangement?: Yes Admission Status: Involuntary Is patient capable of signing voluntary admission?: No Referral Source: Self/Family/Friend Insurance type: MCR  Medical Screening Exam [facility] Walk-in ONLY) Medical Exam completed: Yes  Crisis Care Plan Living Arrangements: Parent Legal Guardian:  Other: (None) [facility]  Education Status Is patient currently in school?: No Current Grade: n/a Highest grade of school patient has completed: n/a Name of school: n/a Contact person: n/a  Risk  to self with the past 6 months Suicidal Ideation: No Has patient been a risk to self within the past 6 months prior to admission? : No Suicidal Intent: No Has patient had any suicidal intent within the past 6 months prior to admission? : No Is patient at risk for suicide?: No Suicidal Plan?: No Has patient had any suicidal plan within the past 6 months prior to admission? : No Access to Means: No What has been your use of drugs/alcohol within the last 12 months?: History of opioid Previous Attempts/Gestures: No How many times?: 0 Other Self Harm Risks: Active Addiction Triggers for Past Attempts: Hallucinations, Other (Comment) Intentional Self Injurious Behavior: None Family Suicide History: Unknown Recent stressful life event(s): Other (Comment) (Stop taking her medications) Persecutory voices/beliefs?: No Depression: Yes Depression Symptoms: Insomnia Substance abuse history and/or treatment for substance abuse?: Yes Suicide prevention information given to non-admitted patients: Not applicable  Risk to Others within the past 6 months Homicidal Ideation: No Does patient have any lifetime risk of violence toward others beyond the six months prior to admission? : No Thoughts of Harm to Others: No Current Homicidal Intent: No Current Homicidal Plan: No Access to Homicidal Means: No Identified Victim: None Reported History of harm to others?: No Assessment of Violence: None Noted Violent Behavior Description: None Reported Does patient have access to weapons?: No Criminal Charges Pending?: No Does patient have a court date: No Is patient on probation?: No  Psychosis Hallucinations: None noted Delusions: Unspecified  Mental Status Report Appearance/Hygiene: Bizarre, In scrubs, Unremarkable Eye Contact: Fair Motor Activity: Freedom of movement, Unremarkable, Unsteady, Restlessness, Hyperactivity Speech: Rapid, Incoherent, Tangential, Word salad Level of Consciousness:  Restless Mood: Anxious, Euphoric, Preoccupied Affect: Euphoric Anxiety Level: Minimal Thought Processes: Flight of Ideas, Irrelevant Judgement: Impaired Orientation: Not oriented Obsessive Compulsive Thoughts/Behaviors: None  Cognitive Functioning Concentration: Decreased Memory: Unable to Assess IQ: Average Insight: Poor Impulse Control: Poor Appetite: Fair Weight Loss: 0 Weight Gain: 0 Sleep: Decreased Total Hours of Sleep: 4 Vegetative Symptoms: None  ADLScreening [facility]) Patient's cognitive ability adequate to safely complete daily activities?: Yes Patient able to express need for assistance with ADLs?: Yes Independently performs ADLs?: Yes (appropriate for developmental age)  Prior Inpatient Therapy Prior Inpatient Therapy: Yes Prior Therapy Dates: 02/2016, 12/2012, 11/2012, 08/2010 & 11/2009 [facility] Reason for Treatment: Schizophrenia  Prior Outpatient Therapy Prior Outpatient Therapy: Yes Prior Therapy Dates: Current Prior Therapy Facilty[facility]Provider([facility]  Reason for Treatment: Schizophrenia Does patient have an ACCT team?: No Does patient have Intensive In-House Services?  : No Does patient have [facility] services? : No Does patient have P4CC services?: No  ADL Screening (condition at time of admission) Patient's cognitive ability adequate to safely complete daily activities?: Yes Is the patient deaf or have difficulty hearing?: No Does the patient have difficulty seeing, even when wearing glasses/contacts?: No Does the patient have difficulty concentrating, remembering, or making decisions?: No Patient able to express need for assistance with ADLs?: Yes Does the patient have difficulty dressing or bathing?: No Independently performs ADLs?: Yes (appropriate for developmental age) Does the patient have difficulty walking or climbing stairs?: No Weakness of Legs: None Weakness of  Arms/Hands: None  Home  Assistive Devices/Equipment Home Assistive Devices/Equipment: None  Therapy Consults (therapy consults require a physician order) PT Evaluation Needed: No OT Evalulation Needed: No SLP Evaluation Needed: No Abuse/Neglect Assessment (Assessment to be complete while patient is alone) Physical Abuse: Denies Verbal Abuse: Denies Sexual Abuse: Denies Exploitation of patient/patient's resources: Denies Self-Neglect: Denies Values / Beliefs Cultural Requests During Hospitalization: None Spiritual Requests During Hospitalization: None Consults Spiritual Care Consult Needed: No Social Work Consult Needed: No      Additional Information 1:1 In Past 12 Months?: No CIRT Risk: No Elopement Risk: No Does patient have medical clearance?: Yes  Child/Adolescent Assessment Running Away Risk: Denies (Patient is an adult)  Disposition:  Disposition Disposition of Patient: Other dispositions (ER MD ordered Psych Consult)  On Site Evaluation by:   Reviewed with Physician:    Lilyan Gilfordalvin J. Bama Hanselman MS, LCAS, LPC, NCC, CCSI Therapeutic Triage Specialist 04/23/2016 3:34 PM

## 2016-04-23 NOTE — ED Notes (Signed)
BEHAVIORAL HEALTH ROUNDING Patient sleeping: YES Patient alert and oriented: Sleeping Behavior appropriate: Sleeping Describe behavior: No inappropriate or unacceptable behaviors noted at this time.  Nutrition and fluids offered: Sleeping Toileting and hygiene offered: Sleeping Sitter present: Yes; 1:1 sitter present at this time. Law enforcement present: YES Law enforcement agency: [employer] ([employer]) 

## 2016-04-23 NOTE — ED Provider Notes (Signed)
Nurse notices it patient runs episodes of sinus tachycardia up to 140. Patient has a wet cough. Patient is still altered and confused when I go in there and decided to her she itches Altace my hand puts it to her forehead and starts counting 202410 [birth year] year old went to 5035. Patient really is not answering my questions are asked her. Patient does not have a any leg swelling I can see. She does not have any fever. Dr. Merdis DelayBernice finds that she has been on xarelto for DVT prophylaxis. I doubt if she's been taking it. We will evaluate her cough tachycardia and apparent lack of xarelto with a chest CT. We'll also get a pregnancy test and a head CT. Dr. Mat Carnelay packs and asked for a repeat S see consult which we will also do.   Arnaldo NatalPaul F Malinda, MD 04/23/16 [phone]

## 2016-04-23 NOTE — ED Notes (Signed)
SOC evaluation at this time. 

## 2016-04-23 NOTE — ED Provider Notes (Signed)
-----------------------------------------   11:53 PM on 04/23/2016 -----------------------------------------  Patient's heart rate noted to be elevated by nursing staff. EKG was obtained.  Lurline IdolI, Loucille Takach, attending physician, personally viewed and interpreted this EKG  EKG Time: 2346 Rate: 141 Rhythm: sinus tachycardia Axis: right axis deviation Intervals: qtc 532 QRS: narrow ST changes: no st elevation Impression: abnormal ekg  Will give IVFs and continue to monitor.  Pulse did improve with fluids.     Phineas SemenGraydon Marguerite Jarboe, MD 04/24/16 [phone]

## 2016-04-23 NOTE — BH Assessment (Addendum)
Pt has [facility] declined by [facility] due to medica acuityl and no programming

## 2016-04-23 NOTE — BH Assessment (Signed)
Referral information for Psychiatric  Hospitalization have been faxed to;    [facility] [phone]) [phone]. [phone]   [facility] [phone] or [phone]),    [facility] [phone]),    [facility] [phone]),     [facility] [phone]),

## 2016-04-23 NOTE — ED Notes (Signed)
BEHAVIORAL HEALTH ROUNDING Patient sleeping: YES Patient alert and oriented: Sleeping Behavior appropriate: Sleeping Describe behavior: No inappropriate or unacceptable behaviors noted at this time.  Nutrition and fluids offered: Sleeping Toileting and hygiene offered: Sleeping Sitter present: Yes; 1:1 sitter present at this time. Law enforcement present: Loss adjuster, charteredYES Law enforcement agency: [employer] ([employer]

## 2016-04-23 NOTE — ED Notes (Signed)
Rhythm change noted on the monitor. Patient showing ST at a rate of 155; no ectopy. MD made aware. VORB from Derrill KayGoodman, MD for a STAT ECG. Order to be entered and carried by this RN.

## 2016-04-23 NOTE — ED Notes (Signed)
Patient awake and yelling at this time. RN to bedside. Sitter observed attempting to keep patient from climbing OOB. Patient yelling for water when RN entered. PO fluids provided and patient repositioned in bed. No further needs at this time. Will continue to monitor.

## 2016-04-23 NOTE — ED Notes (Signed)
Patient awake and alert; trying to get out of bed. She is agitated, but able to be redirected.  Patient able to handle PO fluids and eat cracker without signs of choking noted. Will proceed with scheduled medications as ordered.

## 2016-04-23 NOTE — ED Notes (Signed)
EKG reviewed by MD; shows ST without ectopy at a rate of 140 at this time. MD with VORB for 1L NS bolus. Order to be entered and carried by this RN.

## 2016-04-23 NOTE — ED Notes (Signed)
IVC 

## 2016-04-23 NOTE — ED Provider Notes (Addendum)
-----------------------------------------   8:01 AM on 04/23/2016 -----------------------------------------   Blood pressure (!) 121/110, pulse (!) 114, temperature 97.6 F (36.4 C), temperature source Axillary, resp. rate 18, height 5\' 3"  (1.6 m), weight 136 lb (61.7 kg), SpO2 98 %.  Assuming care from Dr. Darnelle CatalanMalinda of Tiburcio BashKatherine P Jimenez is a 35 y.o. female with a chief complaint of Psychiatric Evaluation .    In summary, 51F with h/o schizophrenia and polysubstance abuse who presents for AMS in the setting of medication non compliance. Patient noted to have runs of sinus tachycardia this morning. She has recently diagnosed h/o DVT and is suppose to be on Xarelto. CTA chest ordered. Drug screen positive for MJ and benzos. UA negative. Labs showing hypokalemia, will supplement PO and IV. CT head and troponin ordered as well. EKG showing new T wave inversions.   _________________________ 9:49 AM on 04/23/2016 -----------------------------------------  CMP showing K of 3.1, CO2 25, sodium of 139, glucose of 83, creatinine of 0.95. Troponin is negative. Anion gap of 11.  _________________________ 1:12 PM on 04/23/2016 -----------------------------------------  CT head and CTA chest with no acute findings. Patient will need follow up for mammogram for nodule seen on left breast on CT scan. Patient too altered at this time to understand this. Will need to be informed once psychiatrically stable. [facility] psych admission. Patient is medically cleared.     Adrienne Sicklearolina Lorah Kalina, MD 04/23/16 1313    Adrienne Sicklearolina Vinette Crites, MD 04/23/16 1332

## 2016-04-23 NOTE — BHH Counselor (Signed)
Patient still sedated.

## 2016-04-23 NOTE — ED Notes (Addendum)
ENVIRONMENTAL ASSESSMENT Potentially harmful objects out of patient reach: YES Personal belongings secured: YES Patient dressed in hospital provided attire only: YES Plastic bags out of patient reach: YES Patient care equipment (cords, cables, call bells, lines, and drains) shortened, removed, or accounted for: YES Equipment and supplies removed from bottom of stretcher: YES Potentially toxic materials out of patient reach: [place] container removed or out of patient reach: YES  Patient observed sleeping at this time. Sitter remains at bedside. NAD noted at this time. No anticipated needs identified by RN at this time. Will continue to monitor.

## 2016-04-24 DIAGNOSIS — F29 Unspecified psychosis not due to a substance or known physiological condition: Secondary | ICD-10-CM | POA: Diagnosis not present

## 2016-04-24 LAB — BASIC METABOLIC PANEL
Anion gap: 5 (ref 5–15)
BUN: 8 mg/dL (ref 6–20)
CO2: 22 mmol/L (ref 22–32)
CREATININE: 0.65 mg/dL (ref 0.44–1.00)
Calcium: 8.1 mg/dL — ABNORMAL LOW (ref 8.9–10.3)
Chloride: 112 mmol/L — ABNORMAL HIGH (ref 101–111)
Glucose, Bld: 98 mg/dL (ref 65–99)
POTASSIUM: 4.4 mmol/L (ref 3.5–5.1)
SODIUM: 139 mmol/L (ref 135–145)

## 2016-04-24 LAB — TSH: TSH: 0.729 u[IU]/mL (ref 0.350–4.500)

## 2016-04-24 MED ORDER — HALOPERIDOL 5 MG PO TABS
5.0000 mg | ORAL_TABLET | Freq: Once | ORAL | Status: AC
  Administered 2016-04-24: 5 mg via ORAL
  Filled 2016-04-24: qty 1

## 2016-04-24 MED ORDER — LORAZEPAM 2 MG/ML IJ SOLN
0.0000 mg | Freq: Two times a day (BID) | INTRAMUSCULAR | Status: DC
Start: 2016-04-26 — End: 2016-04-25

## 2016-04-24 MED ORDER — SODIUM CHLORIDE 0.9 % IV BOLUS (SEPSIS)
500.0000 mL | Freq: Once | INTRAVENOUS | Status: AC
  Administered 2016-04-24: 500 mL via INTRAVENOUS

## 2016-04-24 MED ORDER — LORAZEPAM 2 MG/ML IJ SOLN
INTRAMUSCULAR | Status: AC
  Administered 2016-04-24: 2 mg via INTRAVENOUS
  Filled 2016-04-24: qty 1

## 2016-04-24 MED ORDER — LORAZEPAM 2 MG/ML IJ SOLN
2.0000 mg | Freq: Once | INTRAMUSCULAR | Status: AC
  Administered 2016-04-24: 2 mg via INTRAVENOUS

## 2016-04-24 MED ORDER — LORAZEPAM 2 MG/ML IJ SOLN
0.0000 mg | Freq: Four times a day (QID) | INTRAMUSCULAR | Status: DC
Start: 2016-04-24 — End: 2016-04-25
  Administered 2016-04-24: 1 mg via INTRAVENOUS
  Filled 2016-04-24: qty 2

## 2016-04-24 MED ORDER — NICOTINE 21 MG/24HR TD PT24
21.0000 mg | MEDICATED_PATCH | Freq: Every day | TRANSDERMAL | Status: DC
  Administered 2016-04-24 – 2016-04-25 (×2): 21 mg via TRANSDERMAL
  Filled 2016-04-24 (×2): qty 1

## 2016-04-24 MED ORDER — DIAZEPAM 5 MG PO TABS
5.0000 mg | ORAL_TABLET | Freq: Once | ORAL | Status: AC
  Administered 2016-04-24: 5 mg via ORAL
  Filled 2016-04-24: qty 1

## 2016-04-24 NOTE — ED Notes (Signed)
BEHAVIORAL HEALTH ROUNDING  Patient sleeping: No.  Patient alert and oriented: yes  Behavior appropriate: Yes. ; If no, describe:  Nutrition and fluids offered: Yes  Toileting and hygiene offered: Yes  Sitter present: not applicable, Q 15 min safety rounds and observation.  Law enforcement present: Yes ODS  

## 2016-04-24 NOTE — ED Notes (Signed)
Patient awake at this time; reports that she is hungry. Apple sauce provided per request. Patient consumed entire cup and then went back to sleep.

## 2016-04-24 NOTE — ED Notes (Signed)
Pt up and assisted to bathroom

## 2016-04-24 NOTE — ED Notes (Signed)
Pt heart rate elevated. MD notified.

## 2016-04-24 NOTE — ED Notes (Addendum)
Patient sitting up in bed yelling. She is acting bizaare and attempting to remove medical equipment. Patient difficult to redirect. Patient screming for food, water, and to be "knocked out with medicine". Patient screaming for officers and her charge nurse. Will speak with MD.

## 2016-04-24 NOTE — ED Notes (Signed)
Lab at bedside drawing blood.

## 2016-04-24 NOTE — ED Notes (Signed)
Pt awake and eating breakfast.

## 2016-04-24 NOTE — ED Notes (Signed)
ENVIRONMENTAL ASSESSMENT  Potentially harmful objects out of patient reach: Yes.  Personal belongings secured: Yes.  Patient dressed in hospital provided attire only: Yes.  Plastic bags out of patient reach: Yes.  Patient care equipment (cords, cables, call bells, lines, and drains) shortened, removed, or accounted for: Yes.  Equipment and supplies removed from bottom of stretcher: Yes.  Potentially toxic materials out of patient reach: Yes.  Sharps container removed or out of patient reach: Yes.   BEHAVIORAL HEALTH ROUNDING  Patient sleeping: No.  Patient alert and oriented: yes  Behavior appropriate: Yes. ; If no, describe:  Nutrition and fluids offered: Yes  Toileting and hygiene offered: Yes  Sitter present: not applicable, Q 15 min safety rounds and observation.  Law enforcement present: Yes ODS  ED BHU PLACEMENT JUSTIFICATION  Is the patient under IVC or is there intent for IVC: Yes.  Is the patient medically cleared: Yes.  Is there vacancy in the ED BHU: Yes.  Is the population mix appropriate for patient: Yes.  Is the patient awaiting placement in inpatient or outpatient setting: Yes.  Has the patient had a psychiatric consult: Yes.  Survey of unit performed for contraband, proper placement and condition of furniture, tampering with fixtures in bathroom, shower, and each patient room: Yes. ; Findings: All clear  APPEARANCE/BEHAVIOR  calm, cooperative and adequate rapport can be established  NEURO ASSESSMENT  Orientation: time, place and person  Hallucinations: No.None noted (Hallucinations)  Speech: Normal  Gait: normal  RESPIRATORY ASSESSMENT  WNL  CARDIOVASCULAR ASSESSMENT  WNL except pt remains tachy in the 110 to 120 rage at this time. Pt is ST on the cardiac monitor.  GASTROINTESTINAL ASSESSMENT  WNL  EXTREMITIES  WNL  PLAN OF CARE  Provide calm/safe environment. Vital signs assessed twice daily. ED BHU Assessment once each 12-hour shift. Collaborate with TTS  daily or as condition indicates. Assure the ED provider has rounded once each shift. Provide and encourage hygiene. Provide redirection as needed. Assess for escalating behavior; address immediately and inform ED provider.  Assess family dynamic and appropriateness for visitation as needed: Yes. ; If necessary, describe findings:  Educate the patient/family about BHU procedures/visitation: Yes. ; If necessary, describe findings: Pt is calm and cooperative at this time. Pt understanding and accepting of unit procedures/rules. Will continue to monitor with Q 15 min safety rounds and observation. Pt contracts for safety with this RN at this time.

## 2016-04-24 NOTE — BH Assessment (Signed)
[facility] due to being at capacity.  [facility] due to acuity.

## 2016-04-24 NOTE — ED Notes (Signed)
Spoke with Derrill KayGoodman, MD regarding the reemergence of the TACHYcardia that was seen earlier tonight. MD aware and observing that patient is becoming physically and verbally agitated. MD aware that there is a PRN Haldol order in [facility]. Discussed scheduled medications that were given last night. MD aware that patient was given Haldol at 1700 yesterday. MD with order to given Haldol 5mg  IVP now.

## 2016-04-24 NOTE — ED Notes (Signed)
Patient much more calm following the haldol injection. No further interventions needs at this time. Will continue to monitor.

## 2016-04-24 NOTE — ED Notes (Signed)
Pt father came to ED. This RN updated father. Will return during visiting hours.

## 2016-04-24 NOTE — ED Notes (Signed)
BEHAVIORAL HEALTH ROUNDING Patient sleeping: YES Patient alert and oriented: Sleeping Behavior appropriate: Sleeping Describe behavior: No inappropriate or unacceptable behaviors noted at this time.  Nutrition and fluids offered: Sleeping Toileting and hygiene offered: Sleeping Sitter present: Yes; 1:1 sitter in place.  Law enforcement present: Loss adjuster, charteredYES Law enforcement agency: [employer]

## 2016-04-24 NOTE — ED Notes (Signed)
Pt becoming agitated. Pt stating she has not been given any medications. (Pt had her medications less than one hour ago.) Pt attempting to climb out of bed. Will give PRN medication for agitation.

## 2016-04-24 NOTE — ED Provider Notes (Addendum)
-----------------------------------------   10:01 AM on 04/24/2016 -----------------------------------------  Patient has had some mild sinus tach associated with anxiety mostly here. Negative workup otherwise. CTA of the head and CT of the chest were negative. EKG only showed sinus tach per report. We will continue to observe the patient.  Pt does state that she drinks etoh. No signficant tremor noted. Possible w/d sx however could account for her tachycardia. Will give ativan.  Check tsh. Reassess.  ----------------------------------------- 12:29 PM on 04/24/2016 -----------------------------------------  After Ativan, her heart rate came down to the low 100s, blood pressures have been stable. I discussed with Dr. Judithann SheenSparks, hospitalist, about admission as I feel the patient has been confused and tachycardic and I suspect this could be a benzo withdrawal. He states he would like to recheck the patient's potassium give her IV fluid and they will reassess whether admission is required at this time he would like to defer.  ----------------------------------------- 2:43 PM on 04/24/2016 -----------------------------------------  Pt remains mildy tachycardic ranging from 103 to 120s  She has ressuring pressures. She is not tremulous.  She has a normal potassium and normal tsh.  She is more oriented at this time, answering questions better.  Dr. Judithann SheenSparks continues to decline to admit patient. However, she cannot go to psych facility with a continued tachycardia. I have placed her on ciwa protocol and we will continue to observe her. Signed out at the end of my shift.   Jeanmarie PlantJames A Jaquilla Woodroof, MD 04/24/16 1002    Jeanmarie PlantJames A Dervin Vore, MD 04/24/16 1025    Jeanmarie PlantJames A Aylen Rambert, MD 04/24/16 1230    Jeanmarie PlantJames A Zamaya Rapaport, MD 04/24/16 [phone]

## 2016-04-24 NOTE — ED Notes (Addendum)
Heart rate improving; ST in the 110s at present. Will continue to monitor.

## 2016-04-24 NOTE — ED Notes (Signed)
Pt IV infiltrated. IV taken out.

## 2016-04-24 NOTE — BH Assessment (Signed)
Clinician attempted to [facility] to follow up on placement referral. Clinician unable to reach intake team.  Clinician confirmed with [facility] intake staff that pt referral was currently under review for placement.

## 2016-04-24 NOTE — ED Notes (Signed)
RN attempting to help sitter keep patient in the bed. Patient continues with behavior despite being fed and given PO fluids.

## 2016-04-24 NOTE — ED Notes (Signed)
Patient sitting up in bed; alert and verbal asking to eat. This RN unsure of previous meals during the day due to patient's condition; somnolence secondary to medications administered earlier in the day. Sandwich tray provided by this RN along with PO fluids. Sitter to assist patient and document meal consumption.

## 2016-04-24 NOTE — ED Notes (Signed)
Patient is IVC/ [facility] was [facility] but needs to be medically cleared by the morning due to the patient's increased heart rate.

## 2016-04-24 NOTE — BH Assessment (Signed)
Patient was offered a bed at [facility] ([phone]). Bed will not be available until tomorrow. They are requesting patient receives her breakfast and morning med before being transferred. However, due to the patients elevated heart rate, ER Physician (Dr. Alphonzo LemmingsMcShane) did not want to clear her medically, to be transferred for Psychiatric Admission.  Writer contacted [facility] ([phone]) and updated them about the patient current medical concerns. Writer was informed, the bed offer was still available/open, due to the bed not being available until tomorrow (04/25/2016). However, based the patient's condition overnight and in the morning will be the determining factor on wether or not they will be able to accept/manage her.  Writer informed ER Physician (Dr. Darnelle CatalanMalinda) and patient's nurse Sunny Schlein(Felicia) what [facility] had said.    Patient has been accepted to [facility].  Accepting physician is Dr. Erling ConteNadia Meyer.  Call report to [phone].  Representative was Sera.  ER Staff is aware of it [place](Tracy, ER Sect.; Dr. Leanna BattlesMcShane &  Malinda, ER MD & Sunny SchleinFelicia, Patient's Nurse)

## 2016-04-25 DIAGNOSIS — F203 Undifferentiated schizophrenia: Secondary | ICD-10-CM | POA: Diagnosis not present

## 2016-04-25 DIAGNOSIS — F29 Unspecified psychosis not due to a substance or known physiological condition: Secondary | ICD-10-CM | POA: Diagnosis not present

## 2016-04-25 LAB — URINALYSIS COMPLETE WITH MICROSCOPIC (ARMC ONLY)
Bilirubin Urine: NEGATIVE
GLUCOSE, UA: NEGATIVE mg/dL
Ketones, ur: NEGATIVE mg/dL
Leukocytes, UA: NEGATIVE
Nitrite: NEGATIVE
PROTEIN: NEGATIVE mg/dL
SPECIFIC GRAVITY, URINE: 1.004 — AB (ref 1.005–1.030)
pH: 6 (ref 5.0–8.0)

## 2016-04-25 LAB — WET PREP, GENITAL
CLUE CELLS WET PREP: NONE SEEN
SPERM: NONE SEEN
TRICH WET PREP: NONE SEEN
YEAST WET PREP: NONE SEEN

## 2016-04-25 LAB — CHLAMYDIA/NGC RT PCR (ARMC ONLY)
Chlamydia Tr: NOT DETECTED
N gonorrhoeae: NOT DETECTED

## 2016-04-25 MED ORDER — PAROXETINE HCL 20 MG PO TABS: 20.0000 mg | ORAL_TABLET | Freq: Every day | ORAL | 1 refills | Status: DC

## 2016-04-25 MED ORDER — RIVAROXABAN 20 MG PO TABS: 20.0000 mg | ORAL_TABLET | Freq: Every day | ORAL | 1 refills | Status: DC

## 2016-04-25 MED ORDER — QUETIAPINE FUMARATE 300 MG PO TABS: 600.0000 mg | ORAL_TABLET | Freq: Every day | ORAL | 1 refills | Status: DC

## 2016-04-25 MED ORDER — SODIUM CHLORIDE 0.9 % IV SOLN
Freq: Once | INTRAVENOUS | Status: AC
  Administered 2016-04-25: 09:00:00 via INTRAVENOUS

## 2016-04-25 MED ORDER — LEVETIRACETAM 1000 MG PO TABS
1000.0000 mg | ORAL_TABLET | Freq: Two times a day (BID) | ORAL | 1 refills | Status: DC
Start: 2016-04-25 — End: 2016-08-09

## 2016-04-25 NOTE — ED Provider Notes (Signed)
-----------------------------------------   4:38 AM on 04/25/2016 -----------------------------------------  I was notified by the nursing staff that the patient has had vaginal itching which started tonight. I spoke with Ms. Hoke who informed me that starting yesterday she started to experience vaginal itching. Patient denies any vaginal discharge. I informed her that I would have to do a vaginal exam to which she was in agreement. Vaginal exam performed with female nurses aide Dewayne HatchMyra Mendoza at bedside. Yellowish white discharge noted from which a sample was obtained and will be sent to the laboratory   Darci Currentandolph N Brynley Cuddeback, MD 04/25/16 [phone]

## 2016-04-25 NOTE — ED Notes (Signed)
I have given her her belongings back so that she may dress and prepare for discharge   NAD assessed  She denies paina t this time

## 2016-04-25 NOTE — ED Notes (Signed)
BEHAVIORAL HEALTH ROUNDING Patient sleeping: Yes.   Patient alert and oriented: not applicable SLEEPING Behavior appropriate: Yes.  ; If no, describe: SLEEPING Nutrition and fluids offered: No SLEEPING Toileting and hygiene offered: NoSLEEPING Sitter present: not applicable, Q 15 min safety rounds and observation. Law enforcement present: Yes ODS 

## 2016-04-25 NOTE — ED Notes (Signed)
ED BHU PLACEMENT JUSTIFICATION Is the patient under IVC or is there intent for IVC: Yes.   Is the patient medically cleared: Yes Is there vacancy in the ED BHU: Yes.   Is the population mix appropriate for patient: Yes.   Is the patient awaiting placement in inpatient or outpatient setting: Yes.  inpt admission placement  Has the patient had a psychiatric consult: Yes.   Survey of unit performed for contraband, proper placement and condition of furniture, tampering with fixtures in bathroom, shower, and each patient room: Yes.  ; Findings:  APPEARANCE/BEHAVIOR Calm and cooperative NEURO ASSESSMENT Orientation: oriented x 4   Denies pain Hallucinations: No.None noted (Hallucinations) Speech: Normal Gait: normal RESPIRATORY ASSESSMENT Even  Unlabored respirations  CARDIOVASCULAR ASSESSMENT Pulses equal   regular rate  Skin warm and dry   GASTROINTESTINAL ASSESSMENT no GI complaint EXTREMITIES Full ROM  PLAN OF CARE Provide calm/safe environment. Vital signs assessed twice daily. ED BHU Assessment once each 12-hour shift. Collaborate with TTS daily or as condition indicates. Assure the ED provider has rounded once each shift. Provide and encourage hygiene. Provide redirection as needed. Assess for escalating behavior; address immediately and inform ED provider.  Assess family dynamic and appropriateness for visitation as needed: Yes.  ; If necessary, describe findings:  Educate the patient/family about BHU procedures/visitation: Yes.  ; If necessary, describe findings:

## 2016-04-25 NOTE — ED Notes (Signed)
BEHAVIORAL HEALTH ROUNDING  Patient sleeping: No.  Patient alert and oriented: yes  Behavior appropriate: Yes. ; If no, describe:  Nutrition and fluids offered: Yes  Toileting and hygiene offered: Yes  Sitter present: not applicable, Q 15 min safety rounds and observation.  Law enforcement present: Yes ODS  

## 2016-04-25 NOTE — ED Notes (Signed)
Pt remains awake and is asking for something to help her sleep. Will give pt her prn ativan.

## 2016-04-25 NOTE — ED Notes (Signed)
Pt awake and complaining of itching in her vaginal region. Dr [name] informed.

## 2016-04-25 NOTE — Consult Note (Signed)
[place] [facility] Consult   Reason for Consult:  This is a consult for this 35 year old woman well known to the psychiatry service who has been in the emergency room over the weekend. Referring Physician:  Corky Downs Patient Identification: [name] MRN:  [id number] Principal Diagnosis: Undifferentiated schizophrenia [facility]) Diagnosis:   Patient Active Problem List   Diagnosis Date Noted  . Intellectual disability [F79] 03/24/2016  . Undifferentiated schizophrenia ([place]) [F20.3] 03/24/2016  . Substance-induced delirium ([place]) [V61.607] 03/02/2016  . Altered mental status [R41.82] 03/01/2016  . Seizure ([place]) [R56.9] 03/01/2016  . Hypokalemia [E87.6] 03/01/2016  . Leukocytosis [D72.829] 03/01/2016  . Shoulder pain, right [M25.511] 10/25/2015  . 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 female patient admitted with "I just can't get any Keppra".  HPI:  Patient interviewed. Chart reviewed. Patient well known from previous encounters. Labs and vitals reviewed . This is a 35 year old woman who suffers from multiple problems that often combined to bring her to the emergency room. She has a chronic psychotic disorder with a diagnosis of schizophrenia or schizoaffective disorder, she has mild intellectual disability, she abuses multiple drugs frequently, she has poor support in her social environment and she has a seizure disorder. Patient came into the emergency room on Friday with the family reporting that she had been agitated and acting strangely. She was withdrawn and uncooperative and irritable when I tried to talk to her at that time. Today the patient says that she is feeling fine and feels back to normal. Her interpretation of this trip to the hospital is that she was having seizures. She claims that no one would give her a prescription for her Keppra and so she had been having a lot of  seizures at home. She denied she had been having any suicidal or homicidal thoughts. Denies currently any mood symptoms at all. Denies having any current hallucinations. Patient minimizes or denies substance abuse. She does still have cannabis in her drug screen on presentation but denies that she's been abusing any other drugs. At this time the patient seems to be very much back to her baseline mental state.  Social history: Patient lives with her father and her twin brother who also suffers from severe chronic mental illness. Doesn't work outside the home. The home environment has not seem to be very conducive to containing her behavior problems but she has resisted being in any other kind of living situation.  Medical history: History of seizure disorder treated with Keppra. I'm uncertain why she would have any difficulty being on that. Also abuses opiates regularly. She is prescribed opiates for supposedly chronic joint and back pain although I've never been able to identify any really clear pain issues that seem realistic.  Substance abuse history: History of abuse of multiple substances. Part of the problem is that she is prescribed high doses of multiple controlled substances by her outpatient providers including opiates and benzodiazepines. Also abuse of marijuana and other drugs at times. Tends to resist any interpretation or treatment plan about this.  Past Psychiatric History: Multiple prior hospitalizations and presentations to the hospital. The current mental status I'm seeing her in today is as good as I have ever seen her. She doesn't seem right now to have any sign of active psychosis. Her mood has been calm and appropriate. I think a lot of the problem with keeping her out of the  hospital has been trying to get her compliant with a truly healthy regimen of medicine and behavior but she has been very resistant to any changes in her current plan. Does have a history of some aggression and  bizarre behavior at times.  Risk to Self: Suicidal Ideation: No Suicidal Intent: No Is patient at risk for suicide?: No Suicidal Plan?: No Access to Means: No What has been your use of drugs/alcohol within the last 12 months?: History of opioid How many times?: 0 Other Self Harm Risks: Active Addiction Triggers for Past Attempts: Hallucinations, Other (Comment) Intentional Self Injurious Behavior: None Risk to Others: Homicidal Ideation: No Thoughts of Harm to Others: No Current Homicidal Intent: No Current Homicidal Plan: No Access to Homicidal Means: No Identified Victim: None Reported History of harm to others?: No Assessment of Violence: None Noted Violent Behavior Description: None Reported Does patient have access to weapons?: No Criminal Charges Pending?: No Does patient have a court date: No Prior Inpatient Therapy: Prior Inpatient Therapy: Yes Prior Therapy Dates: 02/2016, 12/2012, 11/2012, 08/2010 & 11/2009 Prior Therapy Facilty/Provider(s): [facility] Reason for Treatment: Schizophrenia Prior Outpatient Therapy: Prior Outpatient Therapy: Yes Prior Therapy Dates: Current Prior Therapy Facilty/Provider(s): [place]  Reason for Treatment: Schizophrenia Does patient have an ACCT team?: No Does patient have Intensive In-House Services?  : No Does patient have [facility] services? : No Does patient have P4CC services?: No  Past Medical History:  Past Medical History:  Diagnosis Date  . Degeneration of lumbar intervertebral disc   . DVT (deep vein thrombosis) in pregnancy ([place])   . Schizoaffective disorder ([place])   . Seizures ([place])   . Shoulder pain, right     Past Surgical History:  Procedure Laterality Date  . CESAREAN SECTION     X2   Family History:  Family History  Problem Relation Age of Onset  . Cancer Mother    Family Psychiatric  History: Family history positive for severe mental illness and her twin brother Social History:  History   Alcohol Use No     History  Drug Use No    Social History   Social History  . Marital status: Divorced    Spouse name: N/A  . Number of children: N/A  . Years of education: N/A   Occupational History  . unemployed    Social History Main Topics  . Smoking status: Current Every Day Smoker    Types: Cigarettes  . Smokeless tobacco: Never Used  . Alcohol use No  . Drug use: No  . Sexual activity: Yes    Birth control/ protection: None   Other Topics Concern  . None   Social History Narrative  . None   Additional Social History:    Allergies:   Allergies  Allergen Reactions  . Latex Rash and Swelling    Other reaction(s): Unknown Skin turns red  . Morphine Other (See Comments) and Swelling    Patient states that her reaction is that her skin gets a bit red.   No urticaria or airway difficulty.    . Prednisone Other (See Comments)    Other reaction(s): Other (See Comments) Stayed up 30 days when pt. Was on prednisone insomnia  . Buprenorphine Hcl-Naloxone Hcl   . Duloxetine Other (See Comments)    Unknown  . Ketorolac Tromethamine   . Tramadol     Other reaction(s): Other (See Comments) Seizures  . Poison Ivy Extract [Poison Ivy Extract] Rash  . Poison [employer]  Oak Extract] Rash  . Sumac Rash    Labs:  Results for orders placed or performed during the hospital encounter of 04/22/16 (from the past 48 hour(s))  Basic metabolic panel     Status: Abnormal   Collection Time: 04/24/16 12:21 PM  Result Value Ref Range   Sodium 139 135 - 145 mmol/L   Potassium 4.4 3.5 - 5.1 mmol/L   Chloride 112 (H) 101 - 111 mmol/L   CO2 22 22 - 32 mmol/L   Glucose, Bld 98 65 - 99 mg/dL   BUN 8 6 - 20 mg/dL   Creatinine, Ser 0.65 0.44 - 1.00 mg/dL   Calcium 8.1 (L) 8.9 - 10.3 mg/dL   GFR calc non Af Amer >60 >60 mL/min   GFR calc Af Amer >60 >60 mL/min    Comment: (NOTE) The eGFR has been calculated using the CKD EPI equation. This calculation has not been  validated in all clinical situations. eGFR's persistently <60 mL/min signify possible Chronic Kidney Disease.    Anion gap 5 5 - 15  Chlamydia/NGC rt PCR     Status: None   Collection Time: 04/25/16  4:40 AM  Result Value Ref Range   Specimen source GC/Chlam ENDOCERVICAL    Chlamydia Tr NOT DETECTED NOT DETECTED   N gonorrhoeae NOT DETECTED NOT DETECTED    Comment: (NOTE) 100  This methodology has not been evaluated in pregnant women or in 200  patients with a history of hysterectomy. 300 400  This methodology will not be performed on patients less than 3  years of age.   Wet prep, genital     Status: Abnormal   Collection Time: 04/25/16  4:40 AM  Result Value Ref Range   Yeast Wet Prep HPF POC NONE SEEN NONE SEEN   Trich, Wet Prep NONE SEEN NONE SEEN   Clue Cells Wet Prep HPF POC NONE SEEN NONE SEEN   WBC, Wet Prep HPF POC MODERATE (A) NONE SEEN   Sperm NONE SEEN   Urinalysis complete, with microscopic     Status: Abnormal   Collection Time: 04/25/16  5:18 AM  Result Value Ref Range   Color, Urine STRAW (A) YELLOW   APPearance CLEAR (A) CLEAR   Glucose, UA NEGATIVE NEGATIVE mg/dL   Bilirubin Urine NEGATIVE NEGATIVE   Ketones, ur NEGATIVE NEGATIVE mg/dL   Specific Gravity, Urine 1.004 (L) 1.005 - 1.030   Hgb urine dipstick 1+ (A) NEGATIVE   pH 6.0 5.0 - 8.0   Protein, ur NEGATIVE NEGATIVE mg/dL   Nitrite NEGATIVE NEGATIVE   Leukocytes, UA NEGATIVE NEGATIVE   RBC / HPF 0-5 0 - 5 RBC/hpf   WBC, UA 0-5 0 - 5 WBC/hpf   Bacteria, UA RARE (A) NONE SEEN   Squamous Epithelial / LPF 0-5 (A) NONE SEEN    Current Facility-Administered Medications  Medication Dose Route Frequency Provider Last Rate Last Dose  . haloperidol lactate (HALDOL) injection 5 mg  5 mg Intravenous Q6H PRN Harvest Dark, MD   5 mg at 04/24/16 2326  . levETIRAcetam (KEPPRA) tablet 1,000 mg  1,000 mg Oral BID Gonzella Lex, MD   1,000 mg at 04/25/16 0929  . LORazepam (ATIVAN) injection 0-4 mg  0-4 mg  Intravenous Q6H Schuyler Amor, MD   1 mg at 04/24/16 1618   Followed by  . [START ON 04/26/2016] LORazepam (ATIVAN) injection 0-4 mg  0-4 mg Intravenous Q12H Schuyler Amor, MD      . LORazepam (ATIVAN)  tablet 1 mg  1 mg Oral Q6H PRN Harvest Dark, MD   1 mg at 04/25/16 0519  . nicotine (NICODERM CQ - dosed in mg/24 hours) patch 21 mg  21 mg Transdermal Daily Nena Polio, MD   21 mg at 04/25/16 9622  . oxyCODONE (OXYCONTIN) 12 hr tablet 10 mg  10 mg Oral Q12H Gonzella Lex, MD   10 mg at 04/25/16 0930  . QUEtiapine (SEROQUEL) tablet 600 mg  600 mg Oral QHS Gonzella Lex, MD   600 mg at 04/24/16 2239  . rivaroxaban (XARELTO) tablet 20 mg  20 mg Oral Q supper Gonzella Lex, MD   20 mg at 04/24/16 1812   Current Outpatient Prescriptions  Medication Sig Dispense Refill  . baclofen (LIORESAL) 10 MG tablet Take 10 mg by mouth 3 (three) times daily.    . drospirenone-ethinyl estradiol (YASMIN,ZARAH,SYEDA) 3-0.03 MG tablet Take 1 tablet by mouth daily.    . meloxicam (MOBIC) 15 MG tablet Take 15 mg by mouth daily. with food  5  . naloxegol oxalate (MOVANTIK) 25 MG TABS tablet Take 25 mg by mouth daily. On empty stomach    . oxyCODONE (OXYCONTIN) 10 mg 12 hr tablet Take 1 tablet (10 mg total) by mouth every 12 (twelve) hours. 2 tablet 0  . QUEtiapine (SEROQUEL XR) 300 MG 24 hr tablet Take 1 tablet (300 mg total) by mouth at bedtime. (Patient taking differently: Take 600 mg by mouth at bedtime. ) 30 tablet 1  . levETIRAcetam (KEPPRA) 1000 MG tablet Take 1 tablet (1,000 mg total) by mouth 2 (two) times daily. 60 tablet 0  . levETIRAcetam (KEPPRA) 1000 MG tablet Take 1 tablet (1,000 mg total) by mouth 2 (two) times daily. 60 tablet 1  . nicotine (NICODERM CQ - DOSED IN MG/24 HOURS) 14 mg/24hr patch Place 1 patch (14 mg total) onto the skin daily. 28 patch 0  . PARoxetine (PAXIL) 20 MG tablet Take 1 tablet (20 mg total) by mouth at bedtime. 30 tablet 1  . QUEtiapine (SEROQUEL) 300 MG tablet Take  2 tablets (600 mg total) by mouth at bedtime. 60 tablet 1  . Rivaroxaban (XARELTO) 15 MG TABS tablet Take 20 mg by mouth daily with supper.     . rivaroxaban (XARELTO) 20 MG TABS tablet Take 1 tablet (20 mg total) by mouth daily with supper. 30 tablet 1    Musculoskeletal: Strength & Muscle Tone: within normal limits Gait & Station: normal Patient leans: N/A  Psychiatric Specialty Exam: Physical Exam  Nursing note and vitals reviewed. Constitutional: She appears well-developed and well-nourished.  HENT:  Head: Normocephalic and atraumatic.  Eyes: Conjunctivae are normal. Pupils are equal, round, and reactive to light.  Neck: Normal range of motion.  Cardiovascular: Regular rhythm and normal heart sounds.   Respiratory: Effort normal. No respiratory distress.  GI: Soft.  Musculoskeletal: Normal range of motion.  Neurological: She is alert.  Skin: Skin is warm and dry.  Psychiatric: She has a normal mood and affect. Her speech is normal and behavior is normal. Thought content normal. Thought content is not paranoid and not delusional. She expresses impulsivity. She expresses no homicidal and no suicidal ideation. She exhibits abnormal recent memory.    Review of Systems  Constitutional: Negative.   HENT: Negative.   Eyes: Negative.   Respiratory: Negative.   Cardiovascular: Negative.   Gastrointestinal: Negative.   Musculoskeletal: Negative.   Skin: Negative.   Neurological: Negative.   Psychiatric/Behavioral: Positive for  memory loss. Negative for depression, hallucinations, substance abuse and suicidal ideas. The patient is not nervous/anxious and does not have insomnia.     Blood pressure 128/79, pulse 93, temperature 98 F (36.7 C), temperature source Oral, resp. rate 18, height 5' 3"  (1.6 m), weight 61.7 kg (136 lb), SpO2 99 %.Body mass index is 24.09 kg/m.  General Appearance: Fairly Groomed  Eye Contact:  Fair  Speech:  Clear and Coherent  Volume:  Normal  Mood:   Euthymic  Affect:  Constricted  Thought Process:  Goal Directed  Orientation:  Full (Time, Place, and Person)  Thought Content:  Logical  Suicidal Thoughts:  No  Homicidal Thoughts:  No  Memory:  Immediate;   Fair Recent;   Poor Remote;   Fair  Judgement:  Impaired  Insight:  Shallow  Psychomotor Activity:  Normal  Concentration:  Concentration: Fair  Recall:  Poor  Fund of Knowledge:  Fair  Language:  Fair  Akathisia:  No  Handed:  Right  AIMS (if indicated):     Assets:  Communication Skills Desire for Improvement Financial Resources/Insurance Housing Social Support  ADL's:  Intact  Cognition:  Impaired,  Mild  Sleep:        Treatment Plan Summary: Daily contact with patient to assess and evaluate symptoms and progress in treatment, Medication management and Plan 35 year old woman who has been here over the weekend back on her medicine. Right now as I mentioned her mental state and behavior are at her baseline. Inpatient treatment is unlikely to make any change to her condition. Patient and I spoke about the importance of taking seriously her miss use of other drugs and prescription medicines and of getting into a more stable living environment and taking care of herself better. Patient states she does intend to do this. I gave her new prescriptions for her Keppra as well as for her Seroquel and Paxil. I have not given her any opiates. She claims that she is trying to get off of them completely. Not given her any new prescriptions for benzodiazepines. Patient no longer meets commitment criteria. Discontinue IVC. Follow-up outpatient  Disposition: Patient does not meet criteria for psychiatric inpatient admission. Supportive therapy provided about ongoing stressors.  Alethia Berthold, MD 04/25/2016 1:32 PM

## 2016-04-25 NOTE — ED Provider Notes (Signed)
-----------------------------------------   8:15 AM on 04/25/2016 -----------------------------------------   BP (!) 141/91 (BP Location: Left Arm)   Pulse 93   Temp 98.1 F (36.7 C) (Oral)   Resp 17   Ht 5\' 3"  (1.6 m)   Wt 61.7 kg   SpO2 100%   BMI 24.09 kg/m   Patient complaining of vaginal itching, pelvic performed overnight. Vitals reviewed. Patient remains medically cleared.  Disposition is pending per Psychiatry/Behavioral Medicine team recommendations.    Jene Everyobert Acelin Ferdig, MD 04/25/16 [phone]

## 2016-04-25 NOTE — ED Notes (Signed)
1L of fluids ordered due to pt being tachycardic at times  IV discontinued due to pt reporting pain at the site  I restarted another IV and initiated the IVF's  Pt verbalizing anxiety at this time  Pt informed of q6 hour order and she received antianxiety at 0519  Pt verbalizes understanding

## 2016-04-25 NOTE — ED Notes (Signed)
Dr.Claapac in room with patient.

## 2016-04-25 NOTE — ED Notes (Signed)
BEHAVIORAL HEALTH ROUNDING Patient sleeping: No. Patient alert and oriented: yes Behavior appropriate: Yes.  ; If no, describe:  Nutrition and fluids offered: yes Toileting and hygiene offered: Yes  Sitter present: q15 minute observations and security  monitoring Law enforcement present: Yes  ODS  

## 2016-04-25 NOTE — ED Notes (Signed)
BEHAVIORAL HEALTH ROUNDING Patient sleeping: No. Patient alert and oriented: yes Behavior appropriate: Yes.  ; If no, describe:  Nutrition and fluids offered: yes Toileting and hygiene offered: Yes  Sitter present:  1:1 sitter at bedside   q15 minute observations and security monitoring Law enforcement present: Yes  ODS  ENVIRONMENTAL ASSESSMENT Potentially harmful objects out of patient reach: Yes.   Personal belongings secured: Yes.   Patient dressed in hospital provided attire only: Yes.   Plastic bags out of patient reach: Yes.   Patient care equipment (cords, cables, call bells, lines, and drains) shortened, removed, or accounted for: Yes.   Equipment and supplies removed from bottom of stretcher: Yes.   Potentially toxic materials out of patient reach: Yes.   Sharps container removed or out of patient reach: Yes.

## 2016-04-25 NOTE — ED Notes (Signed)
Breakfast given to patient.

## 2016-04-25 NOTE — ED Notes (Signed)
Pt observed lying in bed - awake with the TV on    Pt visualized with NAD  No verbalized needs or concerns at this time  Continue to monitor

## 2016-04-25 NOTE — ED Provider Notes (Signed)
HR improved with IV fluids. Patient is calm and at her baseline. Seen by [name] who feels patient does not require transfer and should be discharged.    Adrienne Everyobert Agueda Houpt, MD 04/25/16 [phone]

## 2016-04-25 NOTE — ED Notes (Signed)
BEHAVIORAL HEALTH ROUNDING Patient sleeping: No. Patient alert and oriented: yes Behavior appropriate: Yes.  ; If no, describe:  Nutrition and fluids offered: yes Toileting and hygiene offered: Yes  Sitter present: q15 minute observations and security  monitoring Law enforcement present: Yes  ODS  She is to be discharged to home  Her father is here visiting observed by Alfredia ClientMary jo at this time

## 2016-06-14 ENCOUNTER — Other Ambulatory Visit: Payer: Self-pay | Admitting: Psychiatry

## 2016-07-09 ENCOUNTER — Other Ambulatory Visit: Payer: Self-pay | Admitting: Psychiatry

## 2016-08-09 ENCOUNTER — Ambulatory Visit
Admission: EM | Admit: 2016-08-09 | Discharge: 2016-08-09 | Disposition: A | Discharge status: Home / Self Care | Payer: Medicare Other | Attending: Family Medicine | Admitting: Family Medicine

## 2016-08-09 ENCOUNTER — Ambulatory Visit
Admit: 2016-08-09 | Discharge: 2016-08-09 | Disposition: A | Discharge status: Home / Self Care | Payer: Medicare Other | Attending: Emergency Medicine | Admitting: Emergency Medicine

## 2016-08-09 DIAGNOSIS — F17210 Nicotine dependence, cigarettes, uncomplicated: Secondary | ICD-10-CM | POA: Diagnosis not present

## 2016-08-09 DIAGNOSIS — Z79899 Other long term (current) drug therapy: Secondary | ICD-10-CM | POA: Diagnosis not present

## 2016-08-09 DIAGNOSIS — G4700 Insomnia, unspecified: Secondary | ICD-10-CM | POA: Diagnosis present

## 2016-08-09 DIAGNOSIS — G4701 Insomnia due to medical condition: Secondary | ICD-10-CM | POA: Diagnosis not present

## 2016-08-09 DIAGNOSIS — G43809 Other migraine, not intractable, without status migrainosus: Secondary | ICD-10-CM | POA: Diagnosis not present

## 2016-08-09 DIAGNOSIS — G43909 Migraine, unspecified, not intractable, without status migrainosus: Secondary | ICD-10-CM | POA: Insufficient documentation

## 2016-08-09 LAB — PREGNANCY, URINE: PREG TEST UR: NEGATIVE

## 2016-08-09 MED ORDER — PROMETHAZINE HCL 25 MG PO TABS: 25.0000 mg | ORAL_TABLET | Freq: Three times a day (TID) | ORAL | 0 refills | Status: DC | PRN

## 2016-08-09 MED ORDER — TRAZODONE HCL 50 MG PO TABS: 50.0000 mg | ORAL_TABLET | Freq: Every day | ORAL | 0 refills | Status: DC

## 2016-08-09 NOTE — ED Provider Notes (Signed)
CSN: [id number]     Arrival date & time 08/09/16  4098 History   First MD Initiated Contact with Patient 08/09/16 1231     Chief Complaint  Patient presents with  . Insomnia   (Consider location/radiation/quality/duration/timing/severity/associated sxs/prior Treatment) HPI  36 year old female who presents with insomnia she's had for approximately 14 days. She states that on January 22 she had an epileptic seizure fell striking her head on asphalt roadway and had approximately 1-2 days of amnesia following the fall. States that that she is not sleeping cause of the migraine headache. It is described as a throbbing pain over the right side of her head and scalp. He has a history of migraines in the past as well as epilepsy. Has no means of transportation . In fact they were on their way to [facility] to pick up her epilepsy medications when she had the seizure and fall. Review of her extensive past history shows she has a bipolar schizophrenia disorders. Also on Suboxone for narcotic abuse. She has not seen any medical personnel since her fall even though she has a neurologist that has been following her epilepsy.       Past Medical History:  Diagnosis Date  . Degeneration of lumbar intervertebral disc   . DVT (deep vein thrombosis) in pregnancy (HCC)   . Schizoaffective disorder (HCC)   . Seizures (HCC)   . Shoulder pain, right    Past Surgical History:  Procedure Laterality Date  . CESAREAN SECTION     X2   Family History  Problem Relation Age of Onset  . Cancer Mother    Social History  Substance Use Topics  . Smoking status: Current Every Day Smoker    Packs/day: 0.50    Types: Cigarettes  . Smokeless tobacco: Never Used  . Alcohol use No   OB History    No data available     Review of Systems  Constitutional: Positive for activity change. Negative for chills, fatigue and fever.  Neurological: Positive for seizures and headaches.  All other systems reviewed and are  negative.   Allergies  Latex; Morphine; Prednisone; Buprenorphine hcl-naloxone hcl; Duloxetine; Ketorolac tromethamine; Tramadol; Poison ivy extract [poison ivy extract]; Poison oak extract [poison oak extract]; and Sumac  Home Medications   Prior to Admission medications   Medication Sig Start Date End Date Taking? Authorizing Provider  drospirenone-ethinyl estradiol (YASMIN,ZARAH,SYEDA) 3-0.03 MG tablet Take 1 tablet by mouth daily.   Yes Historical Provider, MD  PARoxetine (PAXIL) 20 MG tablet Take 1 tablet (20 mg total) by mouth at bedtime. 04/25/16  Yes Audery Amel, MD  QUEtiapine (SEROQUEL XR) 300 MG 24 hr tablet Take 1 tablet (300 mg total) by mouth at bedtime. Patient taking differently: Take 600 mg by mouth at bedtime.  03/28/16  Yes Shari Prows, MD  promethazine (PHENERGAN) 25 MG tablet Take 1 tablet (25 mg total) by mouth every 8 (eight) hours as needed for nausea or vomiting. 08/09/16   Lutricia Feil, PA-C  QUEtiapine (SEROQUEL) 300 MG tablet Take 2 tablets (600 mg total) by mouth at bedtime. 04/25/16   Audery Amel, MD  traZODone (DESYREL) 50 MG tablet Take 1 tablet (50 mg total) by mouth at bedtime. 08/09/16   Lutricia Feil, PA-C   Meds Ordered and Administered this Visit  Medications - No data to display  BP 110/81 (BP Location: Right Arm)   Pulse (!) 103   Temp 98.3 F (36.8 C) (Oral)   Resp 16  Ht 5\' 1"  (1.549 m)   Wt 155 lb (70.3 kg)   LMP 07/15/2016 Comment: no chance of preg per pt  SpO2 100%   BMI 29.29 kg/m  No data found.   Physical Exam  Constitutional: She is oriented to person, place, and time. She appears well-developed and well-nourished. No distress.  HENT:  Head: Normocephalic and atraumatic.  Right Ear: External ear normal.  Left Ear: External ear normal.  Nose: Nose normal.  Mouth/Throat: Oropharynx is clear and moist. No oropharyngeal exudate.  She has tenderness palpation of the scalp face right. No defects are palpated of  the skull.  Eyes: EOM are normal. Right eye exhibits no discharge. Left eye exhibits no discharge.  Unable to evaluate pupils due to light sensitivity  Neck: Normal range of motion. Neck supple.  Pulmonary/Chest: Effort normal and breath sounds normal. No respiratory distress. She has no wheezes. She has no rales.  Musculoskeletal: Normal range of motion.  Neurological: She is alert and oriented to person, place, and time. She displays normal reflexes. No cranial nerve deficit or sensory deficit. She exhibits normal muscle tone. Coordination normal.  Skin: Skin is warm and dry. She is not diaphoretic.  Psychiatric: She has a normal mood and affect. Her behavior is normal. Judgment and thought content normal.  Nursing note and vitals reviewed.   Urgent Care Course     Procedures (including critical care time)  Labs Review Labs Reviewed  PREGNANCY, URINE    Imaging Review Ct Head Wo Contrast  Result Date: 08/09/2016 CLINICAL DATA:  Headache and insomnia. Fall on 07/18/2016 striking the head. EXAM: CT HEAD WITHOUT CONTRAST TECHNIQUE: Contiguous axial images were obtained from the base of the skull through the vertex without intravenous contrast. COMPARISON:  04/23/2016 FINDINGS: Brain: The brainstem, cerebellum, cerebral peduncles, thalami, basal ganglia, basilar cisterns, and ventricular system appear within normal limits. No intracranial hemorrhage, mass lesion, or acute CVA. Vascular: Unremarkable Skull: Unremarkable Sinuses/Orbits: Minimal chronic left sphenoid and left posterior ethmoid sinusitis. Other: No supplemental non-categorized findings. IMPRESSION: 1. Minimal chronic sinusitis. Otherwise, no significant abnormalities are observed. Electronically Signed   By: Gaylyn Rong M.D.   On: 08/09/2016 14:06     Visual Acuity Review  Right Eye Distance:   Left Eye Distance:   Bilateral Distance:    Right Eye Near:   Left Eye Near:    Bilateral Near:         MDM    1. Other migraine without status migrainosus, not intractable   2. Insomnia due to medical condition    Discharge Medication List as of 08/09/2016  2:33 PM    START taking these medications   Details  promethazine (PHENERGAN) 25 MG tablet Take 1 tablet (25 mg total) by mouth every 8 (eight) hours as needed for nausea or vomiting., Starting Tue 08/09/2016, Normal    traZODone (DESYREL) 50 MG tablet Take 1 tablet (50 mg total) by mouth at bedtime., Starting Tue 08/09/2016, Normal      Plan: 1. Test/x-ray results and diagnosis reviewed with patient 2. rx as per orders; risks, benefits, potential side effects reviewed with patient 3. Recommend supportive treatment with Use of trazodone for nighttime use to allow her to sleep.. Limited supply of that and some Phenergan to assist her with her headaches. Because of her Suboxone medications I will not prescribe any narcotic medications for this patient and I told her that. Recommend that she get in touch with her neurologist for a follow-up and for further  care and treatment. Also discussed with her the results of the CT scan showing no evidence of subdural hematoma from her fall. Has further problems or issues she should go to the emergency department. 4. F/u prn if symptoms worsen or don't improve     Lutricia FeilWilliam P Shaka Cardin, PA-C 08/09/16 1810

## 2016-08-09 NOTE — ED Triage Notes (Signed)
Patient complains of insomnia x 14 days. Patient states that she has been up constantly with a headache behind her right eye. Patient states that she has been taking benadryl and advil pm.

## 2016-08-14 ENCOUNTER — Encounter: Payer: Self-pay | Admitting: Emergency Medicine

## 2016-08-14 ENCOUNTER — Emergency Department
Admission: EM | Admit: 2016-08-14 | Discharge: 2016-08-14 | Discharge status: Against Medical Advice | Payer: Medicare Other | Attending: Student in an Organized Health Care Education/Training Program | Admitting: Student in an Organized Health Care Education/Training Program

## 2016-08-14 DIAGNOSIS — Z91040 Latex allergy status: Secondary | ICD-10-CM | POA: Insufficient documentation

## 2016-08-14 DIAGNOSIS — Z79899 Other long term (current) drug therapy: Secondary | ICD-10-CM | POA: Insufficient documentation

## 2016-08-14 DIAGNOSIS — F17210 Nicotine dependence, cigarettes, uncomplicated: Secondary | ICD-10-CM | POA: Insufficient documentation

## 2016-08-14 DIAGNOSIS — R569 Unspecified convulsions: Secondary | ICD-10-CM | POA: Diagnosis present

## 2016-08-14 DIAGNOSIS — G40909 Epilepsy, unspecified, not intractable, without status epilepticus: Secondary | ICD-10-CM | POA: Insufficient documentation

## 2016-08-14 MED ORDER — NICOTINE 21 MG/24HR TD PT24
21.0000 mg | MEDICATED_PATCH | Freq: Once | TRANSDERMAL | Status: DC
  Administered 2016-08-14: 21 mg via TRANSDERMAL

## 2016-08-14 MED ORDER — NICOTINE 14 MG/24HR TD PT24
14.0000 mg | MEDICATED_PATCH | Freq: Once | TRANSDERMAL | Status: DC
  Filled 2016-08-14: qty 1

## 2016-08-14 MED ORDER — NICOTINE 21 MG/24HR TD PT24
MEDICATED_PATCH | TRANSDERMAL | Status: AC
  Administered 2016-08-14: 21 mg via TRANSDERMAL
  Filled 2016-08-14: qty 1

## 2016-08-14 NOTE — ED Notes (Signed)
Pt given nicotine patch - pt's hr rechecked and is 121.

## 2016-08-14 NOTE — ED Notes (Signed)
Pt refusing to stay - signed out ama. Escorted out to the waiting room

## 2016-08-14 NOTE — Discharge Instructions (Signed)
Please follow-up with PCP. Please call our [facility] for further assistance with smoking and substance cessation. Turn if he have any other complaints or concerns.

## 2016-08-14 NOTE — ED Triage Notes (Signed)
Was at the gas station, pt told ems she didn't remember walking there and seemed confused. Pt states has seizures that she takes medication for. Wants to be put back on klonipine stating that helps her seizures (was taken off 2 months ago.) . Pt recognizes the quad and does not want to be seen, wants to leave. Denies homicidal and suicidal ideations.

## 2016-08-14 NOTE — ED Provider Notes (Signed)
[facility] Emergency Department Provider Note    None    (approximate)  I have reviewed the triage vital signs and the nursing notes.   HISTORY  Chief Complaint Seizures    HPI [name] is a 36 y.o. female with a history of seizure disorder as well as schizoaffective disorder presents via EMS after having a witnessed seizure-like activity and a gas station today. Patient hemodynamic stable and route via EMS. No significant component of being postictal. Patient arrives to the ER stating that she just wants to go home. She denies any SI or HI. Denies any hallucinations. Eyes any polysubstance abuse. Denies any recent fevers. No headache. No head trauma. No numbness or tingling. Denies any headache. States that she has these seizure activities when she feels more stressed out. Currently feels better.   Past Medical History:  Diagnosis Date  . Degeneration of lumbar intervertebral disc   . DVT (deep vein thrombosis) in pregnancy (HCC)   . Schizoaffective disorder (HCC)   . Seizures (HCC)   . Shoulder pain, right    Family History  Problem Relation Age of Onset  . Cancer Mother    Past Surgical History:  Procedure Laterality Date  . CESAREAN SECTION     X2   Patient Active Problem List   Diagnosis Date Noted  . Intellectual disability 03/24/2016  . 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 Substance-induced delirium (HCC) 03/02/2016  . Altered mental status 03/01/2016  . Seizure (HCC) 03/01/2016  . Hypokalemia 03/01/2016  . Leukocytosis 03/01/2016  . Shoulder pain, right 10/25/2015  . Opiate abuse, episodic 10/22/2015      Prior to Admission medications   Medication Sig Start Date End Date Taking? Authorizing Provider  drospirenone-ethinyl estradiol (YASMIN,ZARAH,SYEDA) 3-0.03 MG tablet Take 1 tablet by mouth daily.    Historical Provider, MD  PARoxetine (PAXIL) 20 MG tablet Take 1 tablet (20 mg total) by mouth at  bedtime. 04/25/16   Audery AmelJohn T Clapacs, MD  promethazine (PHENERGAN) 25 MG tablet Take 1 tablet (25 mg total) by mouth every 8 (eight) hours as needed for nausea or vomiting. 08/09/16   Lutricia FeilWilliam P Roemer, PA-C  QUEtiapine (SEROQUEL) 300 MG tablet Take 2 tablets (600 mg total) by mouth at bedtime. 04/25/16   Audery AmelJohn T Clapacs, MD  traZODone (DESYREL) 50 MG tablet Take 1 tablet (50 mg total) by mouth at bedtime. 08/09/16   Lutricia FeilWilliam P Roemer, PA-C    Allergies Latex; Morphine; Prednisone; Buprenorphine hcl-naloxone hcl; Duloxetine; Ketorolac tromethamine; Tramadol; Poison ivy extract [poison ivy extract]; Poison oak extract [poison oak extract]; and Sumac    Social History Social History  Substance Use Topics  . Smoking status: Current Every Day Smoker    Packs/day: 0.50    Types: Cigarettes  . Smokeless tobacco: Never Used  . Alcohol use No    Review of Systems Patient denies headaches, rhinorrhea, blurry vision, numbness, shortness of breath, chest pain, edema, cough, abdominal pain, nausea, vomiting, diarrhea, dysuria, fevers, rashes or hallucinations unless otherwise stated above in HPI. ____________________________________________   PHYSICAL EXAM:  VITAL SIGNS: Vitals:   08/14/16 2000  BP: (!) 121/94  Pulse: (!) 124  Resp: 17  Temp: 98.8 F (37.1 C)    Constitutional: Alert and oriented.  in no acute distress. Eyes: Conjunctivae are normal. PERRL. EOMI. Head: Atraumatic. Nose: No congestion/rhinnorhea. Mouth/Throat: Mucous membranes are moist.  Oropharynx non-erythematous. Neck: No stridor. Painless ROM. No cervical spine tenderness to palpation Hematological/Lymphatic/Immunilogical: No cervical lymphadenopathy. Cardiovascular:  tachycardic rate, regular rhythm. Grossly normal heart sounds.  Good peripheral circulation. Respiratory: Normal respiratory effort.  No retractions. Lungs CTAB. Gastrointestinal: Soft and nontender. No distention. No abdominal bruits. No CVA  tenderness. Musculoskeletal: No lower extremity tenderness nor edema.  No joint effusions. Neurologic: CN- intact.  No facial droop, Normal FNF.  Normal heel to shin.  Sensation intact bilaterally. Normal speech and language. No gross focal neurologic deficits are appreciated. No gait instability. Skin:  Skin is warm, dry and intact. No rash noted. Psychiatric: Mood and affect are normal. Organized thought process, no grandiosity  ____________________________________________   LABS (all labs ordered are listed, but only abnormal results are displayed)  No results found for this or any previous visit (from the past 24 hour(s)). ____________________________________________ ____________________________________________  RADIOLOGY   ____________________________________________   PROCEDURES  Procedure(s) performed:  Procedures    Critical Care performed: no ____________________________________________   INITIAL IMPRESSION / ASSESSMENT AND PLAN / ED COURSE  Pertinent labs & imaging results that were available during my care of the patient were reviewed by me and considered in my medical decision making (see chart for details).  DDX: seizure, dehydration, electrolyte abn, dysrthmia  [name] is a 36 y.o. who presents to the ED with witnessed seizure-like activity. Patient noted to be tachycardic but otherwise afebrile and hemodynamically stable. Mildly anxious but without any SI or HI. Do not feel patient reports meets any criteria to be IVC at this point. She has no focal neuro deficits to suggest any indication or need for CT imaging of the head. Given her tachycardia recommended patient stay for further evaluation and monitoring including laboratory evaluation for any component of dehydration or electrolyte abnormality. Patient was starting understanding that I was concerned for dehydration or electrolyte abnormality and that leaving prior to further evaluation lungs my  ability to completely treat her. She demonstrates understanding was able to verbalize the risks of leaving AGAINST MEDICAL ADVICE. I did encourage the patient to stay for additional observation. Before we simply monitored her vitals and would not take blood work. Patient refused this and signed out AMA. Encouraged patient to return should she have any other concerns.         ____________________________________________   FINAL CLINICAL IMPRESSION(S) / ED DIAGNOSES  Final diagnoses:  Seizure-like activity (HCC)      NEW MEDICATIONS STARTED DURING THIS VISIT:  Discharge Medication List as of 08/14/2016  8:11 PM       Note:  This document was prepared using Dragon voice recognition software and may include unintentional dictation errors.    Willy Eddy, MD 08/14/16 2259

## 2017-01-09 ENCOUNTER — Emergency Department
Admission: EM | Admit: 2017-01-09 | Discharge: 2017-01-10 | Disposition: A | Discharge status: Home / Self Care | Payer: Medicare Other | Attending: Emergency Medicine | Admitting: Emergency Medicine

## 2017-01-09 ENCOUNTER — Emergency Department: Payer: Medicare Other

## 2017-01-09 DIAGNOSIS — F17210 Nicotine dependence, cigarettes, uncomplicated: Secondary | ICD-10-CM | POA: Insufficient documentation

## 2017-01-09 DIAGNOSIS — Z79899 Other long term (current) drug therapy: Secondary | ICD-10-CM | POA: Insufficient documentation

## 2017-01-09 DIAGNOSIS — Z91040 Latex allergy status: Secondary | ICD-10-CM | POA: Diagnosis not present

## 2017-01-09 DIAGNOSIS — R569 Unspecified convulsions: Secondary | ICD-10-CM

## 2017-01-09 LAB — CBC WITH DIFFERENTIAL/PLATELET
Basophils Absolute: 0.1 10*3/uL (ref 0–0.1)
Basophils Relative: 1 %
Eosinophils Absolute: 0 10*3/uL (ref 0–0.7)
Eosinophils Relative: 0 %
HEMATOCRIT: 40.8 % (ref 35.0–47.0)
HEMOGLOBIN: 13.9 g/dL (ref 12.0–16.0)
LYMPHS ABS: 2.7 10*3/uL (ref 1.0–3.6)
Lymphocytes Relative: 27 %
MCH: 29.6 pg (ref 26.0–34.0)
MCHC: 34.1 g/dL (ref 32.0–36.0)
MCV: 86.9 fL (ref 80.0–100.0)
MONO ABS: 0.8 10*3/uL (ref 0.2–0.9)
MONOS PCT: 8 %
NEUTROS ABS: 6.3 10*3/uL (ref 1.4–6.5)
NEUTROS PCT: 64 %
Platelets: 292 10*3/uL (ref 150–440)
RBC: 4.69 MIL/uL (ref 3.80–5.20)
RDW: 14 % (ref 11.5–14.5)
WBC: 9.9 10*3/uL (ref 3.6–11.0)

## 2017-01-09 LAB — BASIC METABOLIC PANEL
Anion gap: 11 (ref 5–15)
BUN: 8 mg/dL (ref 6–20)
CHLORIDE: 107 mmol/L (ref 101–111)
CO2: 22 mmol/L (ref 22–32)
CREATININE: 0.89 mg/dL (ref 0.44–1.00)
Calcium: 9.4 mg/dL (ref 8.9–10.3)
GFR calc Af Amer: >60 mL/min (ref >60)
GFR calc non Af Amer: >60 mL/min (ref >60)
GLUCOSE: 92 mg/dL (ref 65–99)
Potassium: 3.5 mmol/L (ref 3.5–5.1)
Sodium: 140 mmol/L (ref 135–145)

## 2017-01-09 LAB — ETHANOL: Alcohol, Ethyl (B): <5 mg/dL (ref <5)

## 2017-01-09 NOTE — ED Provider Notes (Signed)
[facility] Emergency Department Provider Note  ____________________________________________   I have reviewed the triage vital signs and the nursing notes.   HISTORY  Chief Complaint Seizures   History limited by: Not Limited   HPI [name] is a 36 y.o. female who presents to the emergency department today because of concerns for seizure-like activity. Patient states she has a history of seizures. She unfortunately can't give a good account of what happened today. She does state however that she has been having some low back pain. It sounds like this did happen prior to the seizure. She is not on any seizure medication and states that she is not supposed be on any seizure medication. She denies any alcohol or drug use.   Past Medical History:  Diagnosis Date  . Degeneration of lumbar intervertebral disc   . DVT (deep vein thrombosis) in pregnancy (HCC)   . Schizoaffective disorder (HCC)   . Seizures (HCC)   . Shoulder pain, right     Patient Active Problem List   Diagnosis Date Noted  . Intellectual disability 03/24/2016  . 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 Substance-induced delirium (HCC) 03/02/2016  . Altered mental status 03/01/2016  . Seizure (HCC) 03/01/2016  . Hypokalemia 03/01/2016  . Leukocytosis 03/01/2016  . Shoulder pain, right 10/25/2015  . Opiate abuse, episodic 10/22/2015    Past Surgical History:  Procedure Laterality Date  . CESAREAN SECTION     X2    Prior to Admission medications   Medication Sig Start Date End Date Taking? Authorizing Provider  drospirenone-ethinyl estradiol (YASMIN,ZARAH,SYEDA) 3-0.03 MG tablet Take 1 tablet by mouth daily.    [provider]  PARoxetine (PAXIL) 20 MG tablet Take 1 tablet (20 mg total) by mouth at bedtime. 04/25/16   Clapacs, Jackquline Denmark, MD  promethazine (PHENERGAN) 25 MG tablet Take 1 tablet (25 mg total) by mouth every 8 (eight) hours as needed for  nausea or vomiting. 08/09/16   Lutricia Feil, PA-C  QUEtiapine (SEROQUEL) 300 MG tablet Take 2 tablets (600 mg total) by mouth at bedtime. 04/25/16   Clapacs, Jackquline Denmark, MD  traZODone (DESYREL) 50 MG tablet Take 1 tablet (50 mg total) by mouth at bedtime. 08/09/16   Lutricia Feil, PA-C    Allergies Latex; Morphine; Prednisone; Buprenorphine hcl-naloxone hcl; Duloxetine; Ketorolac tromethamine; Tramadol; Poison ivy extract [poison ivy extract]; Poison oak extract [poison oak extract]; and Sumac  Family History  Problem Relation Age of Onset  . Cancer Mother     Social History Social History  Substance Use Topics  . Smoking status: Current Every Day Smoker    Packs/day: 0.50    Types: Cigarettes  . Smokeless tobacco: Never Used  . Alcohol use No    Review of Systems Constitutional: No fever/chills Eyes: No visual changes. ENT: No sore throat. Cardiovascular: Denies chest pain. Respiratory: Denies shortness of breath. Gastrointestinal: No abdominal pain.  No nausea, no vomiting.  No diarrhea.   Genitourinary: Negative for dysuria. Musculoskeletal: Positive for low back pain. Skin: Negative for rash. Neurological: Positive for seizure like activity.  ____________________________________________   PHYSICAL EXAM:  VITAL SIGNS: ED Triage Vitals  Enc Vitals Group     BP 01/09/17 2154 113/78     Pulse Rate 01/09/17 2154 86     Resp 01/09/17 2154 18     Temp 01/09/17 2154 99.5 F (37.5 C)     Temp Source 01/09/17 2154 Oral     SpO2 01/09/17 2154 99 %  Weight 01/09/17 2155 155 lb (70.3 kg)     Height 01/09/17 2155 5\' 1"  (1.549 m)     Head Circumference --      Peak Flow --      Pain Score 01/09/17 2153 10   Constitutional: Alert and oriented. Well appearing and in no distress. Eyes: Conjunctivae are normal.  ENT   Head: Normocephalic and atraumatic.   Nose: No congestion/rhinnorhea.   Mouth/Throat: Mucous membranes are moist.   Neck: No  stridor. Hematological/Lymphatic/Immunilogical: No cervical lymphadenopathy. Cardiovascular: Normal rate, regular rhythm.  No murmurs, rubs, or gallops.  Respiratory: Normal respiratory effort without tachypnea nor retractions. Breath sounds are clear and equal bilaterally. No wheezes/rales/rhonchi. Gastrointestinal: Soft and non tender. No rebound. No guarding.  Genitourinary: Deferred Musculoskeletal: Normal range of motion in all extremities. No lower extremity edema. Neurologic:  Normal speech and language. No gross focal neurologic deficits are appreciated.  Skin:  Skin is warm, dry and intact. No rash noted. Psychiatric: Mood and affect are normal. Speech and behavior are normal. Patient exhibits appropriate insight and judgment.  ____________________________________________    LABS (pertinent positives/negatives)  Pending  ____________________________________________   EKG  None  ____________________________________________    RADIOLOGY  None  ____________________________________________   PROCEDURES  Procedures  ____________________________________________   INITIAL IMPRESSION / ASSESSMENT AND PLAN / ED COURSE  Pertinent labs & imaging results that were available during my care of the patient were reviewed by me and considered in my medical decision making (see chart for details).  Patient presents to the emergency department today for seizure-like episode. Patient has history of same is currently not on any medication. Will plan on checking blood work. Patient states she is having pain in her lumbar spine which she has had in the past. Will however check x-ray. Patient is not completely oriented, however it is unclear what her baseline is.  ____________________________________________   FINAL CLINICAL IMPRESSION(S) / ED DIAGNOSES  Final diagnoses:  Seizure [facility](HCC)     Note: This dictation was prepared with Dragon dictation. Any transcriptional errors  that result from this process are unintentional     Phineas SemenGoodman, Zaneta Lightcap, MD 01/09/17 2315

## 2017-01-09 NOTE — ED Triage Notes (Signed)
Pt presents to ED with c/o witnessed seizure. Pt's father reports "I reckon" when asked if pt has been taking medications as prescribed. Pt's father reports today's seizure lasted 3 minutes and fell hitting her face. Father reports possible LOC. Father reports pt is not back to baseline per her normal. Pt deneis loss of bladder or bowel control during seizure. Pt is alert, in NAD, with RR even, regular, and unlabored.

## 2017-01-09 NOTE — ED Notes (Signed)
Pt unable to urinate at this time.  

## 2017-01-09 NOTE — Discharge Instructions (Signed)
Please do not drive, go up on ladders, swim, or put yourself in any situation that could be dangerous to yourself or others if you were to have another seizure. Please seek medical attention for any high fevers, chest pain, shortness of breath, change in behavior, persistent vomiting, bloody stool or any other new or concerning symptoms.

## 2017-01-10 ENCOUNTER — Emergency Department: Payer: Medicare Other

## 2017-01-10 DIAGNOSIS — R569 Unspecified convulsions: Secondary | ICD-10-CM | POA: Diagnosis not present

## 2017-01-10 MED ORDER — LEVETIRACETAM 500 MG/5ML IV SOLN
1000.0000 mg | Freq: Once | INTRAVENOUS | Status: AC
  Administered 2017-01-10: 1000 mg via INTRAVENOUS
  Filled 2017-01-10: qty 10

## 2017-01-10 NOTE — ED Notes (Signed)
Pt unable to urinate at this time.  

## 2017-01-10 NOTE — ED Notes (Signed)
Patient transported to CT 

## 2017-01-22 ENCOUNTER — Emergency Department
Admission: EM | Admit: 2017-01-22 | Discharge: 2017-01-22 | Disposition: A | Discharge status: Home / Self Care | Payer: Medicare Other | Attending: Emergency Medicine | Admitting: Emergency Medicine

## 2017-01-22 ENCOUNTER — Encounter: Payer: Self-pay | Admitting: Emergency Medicine

## 2017-01-22 DIAGNOSIS — Z91040 Latex allergy status: Secondary | ICD-10-CM | POA: Diagnosis not present

## 2017-01-22 DIAGNOSIS — F1110 Opioid abuse, uncomplicated: Secondary | ICD-10-CM

## 2017-01-22 DIAGNOSIS — F17210 Nicotine dependence, cigarettes, uncomplicated: Secondary | ICD-10-CM | POA: Insufficient documentation

## 2017-01-22 DIAGNOSIS — F329 Major depressive disorder, single episode, unspecified: Secondary | ICD-10-CM | POA: Diagnosis present

## 2017-01-22 HISTORY — DX: Major depressive disorder, single episode, unspecified: F32.9

## 2017-01-22 HISTORY — DX: Depression, unspecified: F32.A

## 2017-01-22 LAB — CBC
HEMATOCRIT: 42.4 % (ref 35.0–47.0)
HEMOGLOBIN: 14.5 g/dL (ref 12.0–16.0)
MCH: 29.8 pg (ref 26.0–34.0)
MCHC: 34.2 g/dL (ref 32.0–36.0)
MCV: 87.2 fL (ref 80.0–100.0)
Platelets: 226 10*3/uL (ref 150–440)
RBC: 4.87 MIL/uL (ref 3.80–5.20)
RDW: 13.9 % (ref 11.5–14.5)
WBC: 6.3 10*3/uL (ref 3.6–11.0)

## 2017-01-22 LAB — COMPREHENSIVE METABOLIC PANEL
ALBUMIN: 4.3 g/dL (ref 3.5–5.0)
ALT: 18 U/L (ref 14–54)
ANION GAP: 11 (ref 5–15)
AST: 23 U/L (ref 15–41)
Alkaline Phosphatase: 97 U/L (ref 38–126)
BUN: 9 mg/dL (ref 6–20)
CHLORIDE: 97 mmol/L — AB (ref 101–111)
CO2: 26 mmol/L (ref 22–32)
Calcium: 9.3 mg/dL (ref 8.9–10.3)
Creatinine, Ser: 1.1 mg/dL — ABNORMAL HIGH (ref 0.44–1.00)
GFR calc Af Amer: >60 mL/min (ref >60)
GFR calc non Af Amer: >60 mL/min (ref >60)
GLUCOSE: 196 mg/dL — AB (ref 65–99)
POTASSIUM: 3 mmol/L — AB (ref 3.5–5.1)
SODIUM: 134 mmol/L — AB (ref 135–145)
TOTAL PROTEIN: 7.7 g/dL (ref 6.5–8.1)
Total Bilirubin: 0.7 mg/dL (ref 0.3–1.2)

## 2017-01-22 LAB — ETHANOL: Alcohol, Ethyl (B): <5 mg/dL (ref <5)

## 2017-01-22 LAB — ACETAMINOPHEN LEVEL

## 2017-01-22 LAB — SALICYLATE LEVEL: Salicylate Lvl: <7 mg/dL (ref 2.8–30.0)

## 2017-01-22 MED ORDER — PAROXETINE HCL 20 MG PO TABS: 20.0000 mg | ORAL_TABLET | Freq: Every day | ORAL | 1 refills | Status: DC

## 2017-01-22 NOTE — ED Triage Notes (Signed)
Patient states that she is here for depression. Patient states that she has had depression for about 4 years. Patient states that she is currently taking Paxil and that her MD increased it about a week ago. Patient denies SI at this time.

## 2017-01-22 NOTE — ED Provider Notes (Signed)
[facility] Emergency Department Provider Note       Time seen: ----------------------------------------- 7:39 PM on 01/22/2017 -----------------------------------------     I have reviewed the triage vital signs and the nursing notes.   HISTORY   Chief Complaint Depression    HPI [name] is a 36 y.o. female who presents to the ED for depression. Patient states she's had depression for about 4 years, currently she is taking Paxil without improvement although she has currently lost her prescription. Her doctor increased her Paxil dose but a week ago. She denies suicidal or homicidal ideations. She denies any other medical complaints. Patient states she is here because her father wants her to get detox for opiates. Patient states she snorts 60 mg of oxycodone a day.   Past Medical History:  Diagnosis Date  . Degeneration of lumbar intervertebral disc   . Depression   . DVT (deep vein thrombosis) in pregnancy (HCC)   . Schizoaffective disorder (HCC)   . Seizures (HCC)   . Shoulder pain, right     Patient Active Problem List   Diagnosis Date Noted  . Intellectual disability 03/24/2016  . 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 Substance-induced delirium (HCC) 03/02/2016  . Altered mental status 03/01/2016  . Seizure (HCC) 03/01/2016  . Hypokalemia 03/01/2016  . Leukocytosis 03/01/2016  . Shoulder pain, right 10/25/2015  . Opiate abuse, episodic 10/22/2015    Past Surgical History:  Procedure Laterality Date  . CESAREAN SECTION     X2    Allergies Latex; Morphine; Prednisone; Buprenorphine hcl-naloxone hcl; Duloxetine; Ketorolac tromethamine; Tramadol; Poison ivy extract [poison ivy extract]; Poison oak extract [poison oak extract]; and Sumac  Social History Social History  Substance Use Topics  . Smoking status: Current Every Day Smoker    Packs/day: 0.50    Types: Cigarettes  . Smokeless tobacco: Never Used   . Alcohol use No    Review of Systems Constitutional: Negative for fever. Cardiovascular: Negative for chest pain. Respiratory: Negative for shortness of breath. Gastrointestinal: Negative for abdominal pain, vomiting and diarrhea. Genitourinary: Negative for dysuria. Musculoskeletal: Negative for back pain. Skin: Negative for rash. Neurological: Negative for headaches, focal weakness or numbness. Psychiatric: Positive for depression, negative for suicidal or homicidal ideation. Positive for substance abuse  All systems negative/normal/unremarkable except as stated in the HPI  ____________________________________________   PHYSICAL EXAM:  VITAL SIGNS: ED Triage Vitals [01/22/17 1906]  Enc Vitals Group     BP 105/72     Pulse Rate (!) 130     Resp 18     Temp 98.7 F (37.1 C)     Temp Source Oral     SpO2 98 %     Weight 120 lb (54.4 kg)     Height 5\' 1"  (1.549 m)     Head Circumference      Peak Flow      Pain Score      Pain Loc      Pain Edu?      Excl. in GC?     Constitutional: Alert and oriented. Well appearing and in no distress. Eyes: Conjunctivae are normal. Normal extraocular movements. ENT   Head: Normocephalic and atraumatic.   Nose: No congestion/rhinnorhea.   Mouth/Throat: Mucous membranes are moist.   Neck: No stridor. Cardiovascular: Normal rate, regular rhythm. No murmurs, rubs, or gallops. Respiratory: Normal respiratory effort without tachypnea nor retractions. Breath sounds are clear and equal bilaterally. No wheezes/rales/rhonchi. Gastrointestinal: Soft and nontender. Normal bowel  sounds Musculoskeletal: Nontender with normal range of motion in extremities. No lower extremity tenderness nor edema. Neurologic:  Normal speech and language. No gross focal neurologic deficits are appreciated.  Skin:  Skin is warm, dry and intact. No rash noted. Psychiatric: Depressed mood and affect ____________________________________________  ED  COURSE:  Pertinent labs & imaging results that were available during my care of the patient were reviewed by me and considered in my medical decision making (see chart for details). Patient presents for Depression, we will assess with labs as indicated   Procedures ____________________________________________   LABS (pertinent positives/negatives)  Labs Reviewed  COMPREHENSIVE METABOLIC PANEL - Abnormal; Notable for the following:       Result Value   Sodium 134 (*)    Potassium 3.0 (*)    Chloride 97 (*)    Glucose, Bld 196 (*)    Creatinine, Ser 1.10 (*)    All other components within normal limits  CBC  ETHANOL  SALICYLATE LEVEL  ACETAMINOPHEN LEVEL  URINE DRUG SCREEN, QUALITATIVE ([facility] ONLY)  ____________________________________________  FINAL ASSESSMENT AND PLAN  Narcotic abuse  Plan: Patient's labs  were dictated above. Patient had presented for depression and narcotic abuse. Patient is not a threat to herself or others but does have significant narcotic addiction. She'll be given outpatient narcotic detox referral information and I will refill her prescription of her Paxil.   [name], Shirlena Brinegar E, MD   Note: This note was generated in part or whole with voice recognition software. Voice recognition is usually quite accurate but there are transcription errors that can and very often do occur. I apologize for any typographical errors that were not detected and corrected.     [name], Heavin Sebree E, MD 01/22/17 [phone]

## 2017-01-23 ENCOUNTER — Encounter: Payer: Self-pay | Admitting: Emergency Medicine

## 2017-01-23 ENCOUNTER — Emergency Department
Admission: EM | Admit: 2017-01-23 | Discharge: 2017-01-24 | Disposition: A | Discharge status: Home / Self Care | Payer: Medicare Other | Attending: Emergency Medicine | Admitting: Emergency Medicine

## 2017-01-23 DIAGNOSIS — F79 Unspecified intellectual disabilities: Secondary | ICD-10-CM

## 2017-01-23 DIAGNOSIS — F1110 Opioid abuse, uncomplicated: Secondary | ICD-10-CM | POA: Diagnosis present

## 2017-01-23 DIAGNOSIS — F209 Schizophrenia, unspecified: Secondary | ICD-10-CM | POA: Diagnosis not present

## 2017-01-23 DIAGNOSIS — F203 Undifferentiated schizophrenia: Secondary | ICD-10-CM | POA: Diagnosis present

## 2017-01-23 DIAGNOSIS — Z79899 Other long term (current) drug therapy: Secondary | ICD-10-CM | POA: Diagnosis not present

## 2017-01-23 DIAGNOSIS — F17210 Nicotine dependence, cigarettes, uncomplicated: Secondary | ICD-10-CM | POA: Insufficient documentation

## 2017-01-23 DIAGNOSIS — R45851 Suicidal ideations: Secondary | ICD-10-CM | POA: Insufficient documentation

## 2017-01-23 LAB — CBC WITH DIFFERENTIAL/PLATELET
BASOS ABS: 0.1 10*3/uL (ref 0–0.1)
Basophils Relative: 1 %
EOS ABS: 0.1 10*3/uL (ref 0–0.7)
EOS PCT: 2 %
HCT: 43 % (ref 35.0–47.0)
Hemoglobin: 14.7 g/dL (ref 12.0–16.0)
LYMPHS ABS: 1.8 10*3/uL (ref 1.0–3.6)
LYMPHS PCT: 31 %
MCH: 29.7 pg (ref 26.0–34.0)
MCHC: 34.1 g/dL (ref 32.0–36.0)
MCV: 87.1 fL (ref 80.0–100.0)
MONO ABS: 0.4 10*3/uL (ref 0.2–0.9)
Monocytes Relative: 7 %
Neutro Abs: 3.3 10*3/uL (ref 1.4–6.5)
Neutrophils Relative %: 59 %
PLATELETS: 232 10*3/uL (ref 150–440)
RBC: 4.94 MIL/uL (ref 3.80–5.20)
RDW: 13.8 % (ref 11.5–14.5)
WBC: 5.7 10*3/uL (ref 3.6–11.0)

## 2017-01-23 LAB — COMPREHENSIVE METABOLIC PANEL
ALK PHOS: 90 U/L (ref 38–126)
ALT: 15 U/L (ref 14–54)
AST: 16 U/L (ref 15–41)
Albumin: 4.6 g/dL (ref 3.5–5.0)
Anion gap: 11 (ref 5–15)
BILIRUBIN TOTAL: 0.8 mg/dL (ref 0.3–1.2)
BUN: 10 mg/dL (ref 6–20)
CALCIUM: 9.8 mg/dL (ref 8.9–10.3)
CHLORIDE: 100 mmol/L — AB (ref 101–111)
CO2: 28 mmol/L (ref 22–32)
CREATININE: 1.04 mg/dL — AB (ref 0.44–1.00)
Glucose, Bld: 108 mg/dL — ABNORMAL HIGH (ref 65–99)
Potassium: 3.3 mmol/L — ABNORMAL LOW (ref 3.5–5.1)
SODIUM: 139 mmol/L (ref 135–145)
Total Protein: 8.2 g/dL — ABNORMAL HIGH (ref 6.5–8.1)

## 2017-01-23 LAB — ETHANOL

## 2017-01-23 MED ORDER — NICOTINE 21 MG/24HR TD PT24
21.0000 mg | MEDICATED_PATCH | Freq: Once | TRANSDERMAL | Status: AC
  Administered 2017-01-23 (×2): 21 mg via TRANSDERMAL

## 2017-01-23 MED ORDER — NICOTINE 21 MG/24HR TD PT24
MEDICATED_PATCH | TRANSDERMAL | Status: AC
  Administered 2017-01-23: 21 mg via TRANSDERMAL
  Filled 2017-01-23: qty 1

## 2017-01-23 NOTE — ED Notes (Signed)
Pt. To BHU from ED ambulatory without difficulty, to room  BHU 1. [facility]. Pt. Is alert and oriented, warm and dry in no distress. Pt. Denies SI, HI, and AVH. Pt. Calm and cooperative. Pt. Made aware of security cameras and Q15 minute rounds. Pt. Encouraged to let Nursing staff know of any concerns or needs.
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
## 2017-01-23 NOTE — ED Triage Notes (Signed)
Arrives with officer with IVC papers. Patient states she would like help to get off oxycodone in hopes she can regain custody of her 2 children. Patient states [facility]. Denies SI. States IVC petition was initiated by uncle and father.

## 2017-01-23 NOTE — ED Notes (Signed)
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
## 2017-01-24 DIAGNOSIS — F203 Undifferentiated schizophrenia: Secondary | ICD-10-CM

## 2017-01-24 DIAGNOSIS — F209 Schizophrenia, unspecified: Secondary | ICD-10-CM | POA: Diagnosis not present

## 2017-01-24 LAB — URINE DRUG SCREEN, QUALITATIVE (ARMC ONLY)
AMPHETAMINES, UR SCREEN: NOT DETECTED
BARBITURATES, UR SCREEN: NOT DETECTED
BENZODIAZEPINE, UR SCRN: NOT DETECTED
Cannabinoid 50 Ng, Ur ~~LOC~~: POSITIVE — AB
Cocaine Metabolite,Ur ~~LOC~~: NOT DETECTED
MDMA (Ecstasy)Ur Screen: NOT DETECTED
Methadone Scn, Ur: NOT DETECTED
OPIATE, UR SCREEN: NOT DETECTED
Phencyclidine (PCP) Ur S: NOT DETECTED
TRICYCLIC, UR SCREEN: POSITIVE — AB

## 2017-01-24 LAB — PREGNANCY, URINE: PREG TEST UR: NEGATIVE

## 2017-01-24 MED ORDER — OXYCODONE HCL 5 MG PO TABS
10.0000 mg | ORAL_TABLET | Freq: Once | ORAL | Status: AC
  Administered 2017-01-24: 10 mg via ORAL
  Filled 2017-01-24: qty 2

## 2017-01-24 MED ORDER — DROSPIRENONE-ETHINYL ESTRADIOL 3-0.02 MG PO TABS: 1.0000 | ORAL_TABLET | Freq: Every day | ORAL | Status: DC

## 2017-01-24 MED ORDER — LEVETIRACETAM 500 MG PO TABS
1000.0000 mg | ORAL_TABLET | Freq: Two times a day (BID) | ORAL | Status: DC
  Administered 2017-01-24: 1000 mg via ORAL
  Filled 2017-01-24: qty 2

## 2017-01-24 MED ORDER — PAROXETINE HCL 20 MG PO TABS: 20.0000 mg | ORAL_TABLET | Freq: Every day | ORAL | Status: DC

## 2017-01-24 MED ORDER — QUETIAPINE FUMARATE 300 MG PO TABS: 600.0000 mg | ORAL_TABLET | Freq: Every day | ORAL | Status: DC

## 2017-01-24 MED ORDER — LAMOTRIGINE 25 MG PO TABS
50.0000 mg | ORAL_TABLET | Freq: Every day | ORAL | Status: DC
  Administered 2017-01-24: 50 mg via ORAL
  Filled 2017-01-24: qty 2

## 2017-01-24 MED ORDER — ACETAMINOPHEN 325 MG PO TABS
650.0000 mg | ORAL_TABLET | Freq: Once | ORAL | Status: AC
  Administered 2017-01-24: 650 mg via ORAL
  Filled 2017-01-24: qty 2

## 2017-01-24 NOTE — ED Notes (Signed)
Pt will be discharged home today

## 2017-01-24 NOTE — Progress Notes (Signed)
CSW contacted Patient's father, [name] [phone]) and informed him that Patient has been cleared for discharge and is in need of transportation. Patient's father reports that he will return to pick Patient up. CSW has notified Charity fundraiserN.    Enos FlingAshley Lynette Noah, MSW, LCSW [facility] Clinical Social Worker [phone]

## 2017-01-24 NOTE — ED Provider Notes (Addendum)
-----------------------------------------   7:24 AM on 01/24/2017 -----------------------------------------   Blood pressure 108/74, pulse 89, temperature 98.4 F (36.9 C), temperature source Oral, resp. rate 16, height 5\' 1"  (1.549 m), weight 59 kg (130 lb), last menstrual period 01/12/2017, SpO2 99 %.  The patient had no acute events since last update.  Calm and cooperative at this time.  Disposition is pending Psychiatry/Behavioral Medicine team recommendations.   ----------------------------------------- 3:07 PM on 01/24/2017 -----------------------------------------  Cleared by psychiatry ongoing issues or complaints, they feel that she is safe for discharge we will discharge.   Jeanmarie PlantMcShane, James A, MD 01/24/17 [phone]    Jeanmarie PlantMcShane, James A, MD 01/24/17 [phone]

## 2017-01-24 NOTE — ED Notes (Signed)
Pt discharged to lobby. No belongings in hospital.  Discharge paperwork reviewed with patient.

## 2017-01-24 NOTE — ED Notes (Signed)
Pt yelling on the phone while in dayroom. Pt calmer after Engineer, materialssecurity officer intervened. Maintained on 15 minute checks and observation by security camera for safety.

## 2017-01-24 NOTE — Progress Notes (Signed)
Spoke with Dr. Alphonzo LemmingsMcShane concerning pt stating she is having aura's of "flashing lights. Metal taste in her mouth and left shoulder pain". Pt stated, she has a history of seizures and was on Keppra and Lamictal. Dr.McShane order previous medication per history of Keppra and Lamictal along with tylenol 650 mg of tylenol for shoulder pain 4 out of 10 will continue to monitor pt.

## 2017-01-24 NOTE — ED Notes (Signed)
Pt refused to sign for discharge paperwork.  

## 2017-01-24 NOTE — ED Notes (Signed)
Pt stated she did not have a ride home and wanted to stay an additional night. LCSW notified and able to get in touch with the patient's father. Pt's father will be here to get patient this evening. Pt informed and accepting. Maintained on 15 minute checks and observation by security camera for safety.

## 2017-01-24 NOTE — ED Notes (Signed)
Psychiatrist at bedside. Pt is calm. Maintained on 15 minute checks and observation by security camera for safety.

## 2017-01-24 NOTE — ED Notes (Signed)
Pt discharged to father in lobby. Pt did not have any belongings in hospital. Discharge paperwork reviewed with patient.

## 2017-01-24 NOTE — ED Notes (Deleted)
Pt is currently in the shower attended to her adl's. Pt has a wooden boot on her right foot. Pt verbalize she has a fracture foot. Pt denies pain at this time. Pt denies ah/vh/si/and hi at this time. Pt is medication compliant. Per pharmacy pt Norgestimate esthinyl estradiol medication is not available and needs to be brought in from home. No behavioral issue noted or reported at this time. Pt consumed all her breakfast. Pt remains on Q 15 mins safety rounds. Will cont to monitor pt.

## 2017-01-24 NOTE — ED Notes (Signed)
Patient asleep in room. No noted distress or abnormal behavior. Will continue 15 minute checks and observation by security cameras for safety. 

## 2017-01-24 NOTE — Consult Note (Signed)
[facility] Consult   Reason for Consult:  Consult for 36 year old woman with a history of schizophrenia Referring Physician:  [place] Patient Identification: [name] MRN:  [id number] Principal Diagnosis: Undifferentiated schizophrenia [facility]) Diagnosis:   Patient Active Problem List   Diagnosis Date Noted  . Intellectual disability [F79] 03/24/2016  . Undifferentiated schizophrenia ([place]) [F20.3] 03/24/2016  . Substance-induced delirium ([place]) [O13.086] 03/02/2016  . Altered mental status [R41.82] 03/01/2016  . Seizure ([place]) [R56.9] 03/01/2016  . Hypokalemia [E87.6] 03/01/2016  . Leukocytosis [D72.829] 03/01/2016  . Shoulder pain, right [M25.511] 10/25/2015  . 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 female patient admitted with "I wanted to get off my medicine".  HPI:  Patient interviewed chart reviewed. Patient known from previous encounters. 36 year old woman with a history of schizophrenia brought in under involuntary commitment that alleges suicidal ideation. On interview today the patient says that she has no suicidal ideation. She says that she was told to say that by her father because he wants her in the hospital to get off of her pain medicine. As usual the patient is rambling and vague and a little hard to follow. She says that she thinks if she stops taking her narcotic pain medicine she will be allowed to see her children. I think this is probably inaccurate on her part. She denies that her mood has been any different than usual. Denies that she's been having recent hallucinations. Says that she is compliant with her Paxil and Seroquel. She is still prescribed narcotics by a pain clinic. Patient went back and forth with me as to whether she ever misused it. Denies that she's using any other drugs except for marijuana regularly. Patient denies suicidal or  homicidal ideation. Denies any new acute stresses.  Social history: Lives with her father. Sounds like they have pretty impoverished circumstances living in a camper right now.  Medical history: Patient has a history of seizures for which she takes anticonvulsant medicine. Chronic pain in her joints.  Substance abuse history: She does have an established history of misuse of narcotics. This is been a problem in the past as she continues to go to see a pain doctor who willingly prescribes her narcotic pain medicines. She also smokes marijuana on repentant only and does not see that as a problem.  Past Psychiatric History: Patient has a history of previous hospitalizations. I have seen her at times when she decompensates into severe psychosis. Sometimes this is complicated if she has a seizure and will have a postictal delirium. There have been problems in the past with compliance with medicine. She has injured herself and taken overdoses before. No history of serious violence to others. Patient also has some chronic intellectual disability in addition to her schizophrenia and seizure disorder  Risk to Self: Suicidal Ideation: Yes-Currently Present Suicidal Intent: Yes-Currently Present Is patient at risk for suicide?: No (Currently in the hospital) Suicidal Plan?: Yes-Currently Present Specify Current Suicidal Plan: Refuses to disclose plan Access to Means:  (Unknown) What has been your use of drugs/alcohol within the last 12 months?: Use of marijuana How many times?: 5 Other Self Harm Risks: cutting Triggers for Past Attempts: Unknown Intentional Self Injurious Behavior: Cutting Risk to Others: Homicidal Ideation: No Thoughts of Harm to Others: No Current Homicidal Intent: No Current Homicidal Plan: No Access to Homicidal Means: No Identified Victim: None identified History of harm  to others?: No Assessment of Violence: None Noted Violent Behavior Description: denied Does patient have  access to weapons?: No Criminal Charges Pending?: No Does patient have a court date: No Prior Inpatient Therapy: Prior Inpatient Therapy: Yes Prior Therapy Dates: 2017 and prior Prior Therapy Facilty/Provider(s): [facility] Reason for Treatment: Bipolar Disorder Prior Outpatient Therapy: Prior Outpatient Therapy: No Prior Therapy Dates: n/a Prior Therapy Facilty/Provider(s): n/a Reason for Treatment: n/a Does patient have an ACCT team?: No Does patient have Intensive In-House Services?  : No Does patient have [facility] services? : No Does patient have P4CC services?: No  Past Medical History:  Past Medical History:  Diagnosis Date  . Degeneration of lumbar intervertebral disc   . Depression   . DVT (deep vein thrombosis) in pregnancy ([place])   . Schizoaffective disorder ([place])   . Seizures ([place])   . Shoulder pain, right     Past Surgical History:  Procedure Laterality Date  . CESAREAN SECTION     X2   Family History:  Family History  Problem Relation Age of Onset  . Cancer Mother    Family Psychiatric  History: Positive for substance abuse history and possible mental health problems Social History:  History  Alcohol Use No     History  Drug Use No    Social History   Social History  . Marital status: Divorced    Spouse name: N/A  . Number of children: N/A  . Years of education: N/A   Occupational History  . unemployed    Social History Main Topics  . Smoking status: Current Every Day Smoker    Packs/day: 1.50    Types: Cigarettes  . Smokeless tobacco: Never Used  . Alcohol use No  . Drug use: No  . Sexual activity: Not Asked   Other Topics Concern  . None   Social History Narrative  . None   Additional Social History:    Allergies:   Allergies  Allergen Reactions  . Latex Rash and Swelling    Other reaction(s): Unknown Skin turns red  . Morphine Other (See Comments) and Swelling    Patient states that her reaction is that her skin gets a bit red.    No urticaria or airway difficulty.    . Prednisone Other (See Comments)    Other reaction(s): Other (See Comments) Stayed up 30 days when pt. Was on prednisone insomnia  . Buprenorphine Hcl-Naloxone Hcl   . Duloxetine Other (See Comments)    Unknown  . Ketorolac Tromethamine   . Tramadol     Other reaction(s): Other (See Comments) Seizures  . Poison Ivy Extract [Poison Ivy Extract] Rash  . Poison Oak Extract [Poison Oak Extract] Rash  . Sumac Rash    Labs:  Results for orders placed or performed during the hospital encounter of 01/23/17 (from the past 48 hour(s))  Comprehensive metabolic panel     Status: Abnormal   Collection Time: 01/23/17  5:38 PM  Result Value Ref Range   Sodium 139 135 - 145 mmol/L   Potassium 3.3 (L) 3.5 - 5.1 mmol/L   Chloride 100 (L) 101 - 111 mmol/L   CO2 28 22 - 32 mmol/L   Glucose, Bld 108 (H) 65 - 99 mg/dL   BUN 10 6 - 20 mg/dL   Creatinine, Ser 1.04 (H) 0.44 - 1.00 mg/dL   Calcium 9.8 8.9 - 10.3 mg/dL   Total Protein 8.2 (H) 6.5 - 8.1 g/dL   Albumin 4.6 3.5 - 5.0 g/dL  AST 16 15 - 41 U/L   ALT 15 14 - 54 U/L   Alkaline Phosphatase 90 38 - 126 U/L   Total Bilirubin 0.8 0.3 - 1.2 mg/dL   GFR calc non Af Amer >60 >60 mL/min   GFR calc Af Amer >60 >60 mL/min    Comment: (NOTE) The eGFR has been calculated using the CKD EPI equation. This calculation has not been validated in all clinical situations. eGFR's persistently <60 mL/min signify possible Chronic Kidney Disease.    Anion gap 11 5 - 15  Ethanol     Status: None   Collection Time: 01/23/17  5:38 PM  Result Value Ref Range   Alcohol, Ethyl (B) <5 <5 mg/dL    Comment:        LOWEST DETECTABLE LIMIT FOR SERUM ALCOHOL IS 5 mg/dL FOR MEDICAL PURPOSES ONLY   CBC with Diff     Status: None   Collection Time: 01/23/17  5:38 PM  Result Value Ref Range   WBC 5.7 3.6 - 11.0 K/uL   RBC 4.94 3.80 - 5.20 MIL/uL   Hemoglobin 14.7 12.0 - 16.0 g/dL   HCT 43.0 35.0 - 47.0 %   MCV 87.1 80.0 -  100.0 fL   MCH 29.7 26.0 - 34.0 pg   MCHC 34.1 32.0 - 36.0 g/dL   RDW 13.8 11.5 - 14.5 %   Platelets 232 150 - 440 K/uL   Neutrophils Relative % 59 %   Neutro Abs 3.3 1.4 - 6.5 K/uL   Lymphocytes Relative 31 %   Lymphs Abs 1.8 1.0 - 3.6 K/uL   Monocytes Relative 7 %   Monocytes Absolute 0.4 0.2 - 0.9 K/uL   Eosinophils Relative 2 %   Eosinophils Absolute 0.1 0 - 0.7 K/uL   Basophils Relative 1 %   Basophils Absolute 0.1 0 - 0.1 K/uL  Urine Drug Screen, Qualitative     Status: Abnormal   Collection Time: 01/24/17  4:30 AM  Result Value Ref Range   Tricyclic, Ur Screen POSITIVE (A) NONE DETECTED   Amphetamines, Ur Screen NONE DETECTED NONE DETECTED   MDMA (Ecstasy)Ur Screen NONE DETECTED NONE DETECTED   Cocaine Metabolite,Ur [place] NONE DETECTED NONE DETECTED   Opiate, Ur Screen NONE DETECTED NONE DETECTED   Phencyclidine (PCP) Ur S NONE DETECTED NONE DETECTED   Cannabinoid 50 Ng, Ur [place] POSITIVE (A) NONE DETECTED   Barbiturates, Ur Screen NONE DETECTED NONE DETECTED   Benzodiazepine, Ur Scrn NONE DETECTED NONE DETECTED   Methadone Scn, Ur NONE DETECTED NONE DETECTED    Comment: (NOTE) 335  Tricyclics, urine               Cutoff 1000 ng/mL 200  Amphetamines, urine             Cutoff 1000 ng/mL 300  MDMA (Ecstasy), urine           Cutoff 500 ng/mL 400  Cocaine Metabolite, urine       Cutoff 300 ng/mL 500  Opiate, urine                   Cutoff 300 ng/mL 600  Phencyclidine (PCP), urine      Cutoff 25 ng/mL 700  Cannabinoid, urine              Cutoff 50 ng/mL 800  Barbiturates, urine             Cutoff 200 ng/mL 900  Benzodiazepine, urine  Cutoff 200 ng/mL 1000 Methadone, urine                Cutoff 300 ng/mL 1100 1200 The urine drug screen provides only a preliminary, unconfirmed 1300 analytical test result and should not be used for non-medical 1400 purposes. Clinical consideration and professional judgment should 1500 be applied to any positive drug screen result due to  possible 1600 interfering substances. A more specific alternate chemical method 1700 must be used in order to obtain a confirmed analytical result.  1800 Gas chromato graphy / mass spectrometry (GC/MS) is the preferred 1900 confirmatory method.   Pregnancy, urine     Status: None   Collection Time: 01/24/17  4:30 AM  Result Value Ref Range   Preg Test, Ur NEGATIVE NEGATIVE    Current Facility-Administered Medications  Medication Dose Route Frequency Provider Last Rate Last Dose  . drospirenone-ethinyl estradiol (YAZ,GIANVI,LORYNA) 3-0.02 MG per tablet 1 tablet  1 tablet Oral Daily McShane, James A, MD      . lamoTRIgine (LAMICTAL) tablet 50 mg  50 mg Oral Daily Schuyler Amor, MD   50 mg at 01/24/17 1035  . levETIRAcetam (KEPPRA) tablet 1,000 mg  1,000 mg Oral BID Schuyler Amor, MD   1,000 mg at 01/24/17 1037  . [COMPLETED] nicotine (NICODERM CQ - dosed in mg/24 hours) patch 21 mg  21 mg Transdermal Once Harvest Dark, MD   21 mg at 01/23/17 2130  . PARoxetine (PAXIL) tablet 20 mg  20 mg Oral QHS Schuyler Amor, MD      . QUEtiapine (SEROQUEL) tablet 600 mg  600 mg Oral QHS Schuyler Amor, MD       Current Outpatient Prescriptions  Medication Sig Dispense Refill  . cyclobenzaprine (FLEXERIL) 10 MG tablet Take 10 mg by mouth 2 (two) times daily.  2  . lamoTRIgine (LAMICTAL) 25 MG tablet Take 50 mg by mouth daily.    Marland Kitchen levETIRAcetam (KEPPRA) 1000 MG tablet Take 1,000 mg by mouth 2 (two) times daily.  1  . [name] 3-0.02 MG tablet Take 1 tablet by mouth daily.  5  . Oxycodone HCl 10 MG TABS Take 10 mg by mouth 3 (three) times daily.  0  . PARoxetine (PAXIL) 20 MG tablet Take 1 tablet (20 mg total) by mouth at bedtime. 30 tablet 1  . QUEtiapine (SEROQUEL) 300 MG tablet Take 2 tablets (600 mg total) by mouth at bedtime. 60 tablet 1  . promethazine (PHENERGAN) 25 MG tablet Take 1 tablet (25 mg total) by mouth every 8 (eight) hours as needed for nausea or vomiting. (Patient not  taking: Reported on 01/24/2017) 21 tablet 0  . traZODone (DESYREL) 50 MG tablet Take 1 tablet (50 mg total) by mouth at bedtime. (Patient not taking: Reported on 01/24/2017) 14 tablet 0    Musculoskeletal: Strength & Muscle Tone: within normal limits Gait & Station: normal Patient leans: N/A  Psychiatric Specialty Exam: Physical Exam  Nursing note and vitals reviewed. Constitutional: She appears well-developed and well-nourished.  HENT:  Head: Normocephalic and atraumatic.  Eyes: Pupils are equal, round, and reactive to light. Conjunctivae are normal.  Neck: Normal range of motion.  Cardiovascular: Regular rhythm and normal heart sounds.   Respiratory: Effort normal. No respiratory distress.  GI: Soft.  Musculoskeletal: Normal range of motion.  Neurological: She is alert.  Skin: Skin is warm and dry.  Psychiatric: Her affect is blunt. Her speech is delayed. She is slowed. Thought content is not paranoid. Cognition  and memory are impaired. She expresses impulsivity. She expresses no homicidal and no suicidal ideation. She exhibits abnormal recent memory.    Review of Systems  Constitutional: Negative.   HENT: Negative.   Eyes: Negative.   Respiratory: Negative.   Cardiovascular: Negative.   Gastrointestinal: Negative.   Musculoskeletal: Positive for joint pain.  Skin: Negative.   Neurological: Negative.   Psychiatric/Behavioral: Negative for depression, hallucinations, memory loss, substance abuse and suicidal ideas. The patient is not nervous/anxious and does not have insomnia.     Blood pressure 108/74, pulse 89, temperature 98.4 F (36.9 C), temperature source Oral, resp. rate 16, height _0  (1.549 m), weight 130 lb (59 kg), last menstrual period 01/12/2017, SpO2 99 %.Body mass index is 24.56 kg/m.  General Appearance: Disheveled  Eye Contact:  Good  Speech:  Clear and Coherent  Volume:  Decreased  Mood:  Euthymic  Affect:  Blunt  Thought Process:  Disorganized   Orientation:  Full (Time, Place, and Person)  Thought Content:  Rumination and Tangential  Suicidal Thoughts:  No  Homicidal Thoughts:  No  Memory:  Immediate;   Fair Recent;   Fair Remote;   Fair  Judgement:  Fair  Insight:  Fair  Psychomotor Activity:  Normal  Concentration:  Concentration: Fair  Recall:  [employer] of Knowledge:  Fair  Language:  Poor  Akathisia:  No  Handed:  Right  AIMS (if indicated):     Assets:  Communication Skills Desire for Improvement Financial Resources/Insurance Housing Social Support  ADL's:  Intact  Cognition:  Impaired,  Mild  Sleep:        Treatment Plan Summary: Medication management and Plan This is a 36 year old woman with a history of schizophrenia intellectual disability and substance abuse. Patient currently presents to me as being completely free of any suicidal thoughts. No indication that she has tried to harm herself. She has disorganized thoughts but is easily redirected and given my experience with her she is actually in a relatively good state as far as mental health issues. Patient does not meet commitment criteria. Does not require inpatient hospitalization. We discussed as we have in the past the difference between appropriate and inappropriate use of her narcotics. This is an issue the patient and her family will have to work out with the pain doctor who is seeing her. No change to medicine for today. Discontinue IVC. She can be released from the emergency room.  Disposition: Patient does not meet criteria for psychiatric inpatient admission. Supportive therapy provided about ongoing stressors.  Alethia Berthold, MD 01/24/2017 1:02 PM

## 2017-05-09 IMAGING — CT CT HEAD W/O CM
3 series · 15 of 47 positions shown, 18 images · non-contrast
Comparison: CT 02/29/2016

CLINICAL DATA: Altered mental status.  CPR performed on patient.

EXAM:
CT HEAD WITHOUT CONTRAST
TECHNIQUE: Contiguous axial images were obtained from the base of the skull
through the vertex without intravenous contrast.

[Series 2: head wo · axial · 0.42mm/px · z∈[-108,+37]mm · 9 of 35 slices shown, 12 images]
[im 3/35  brain]
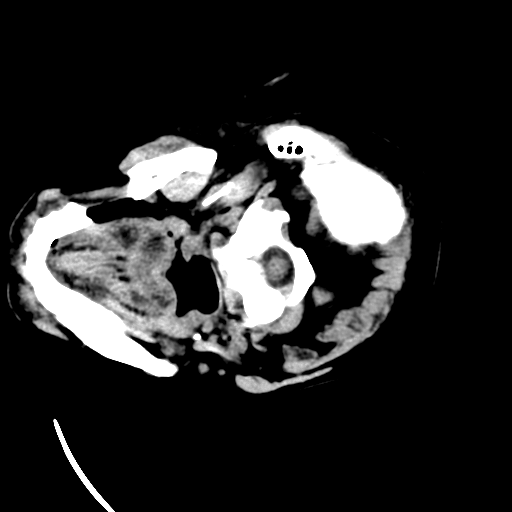
[im 3/35  bone]
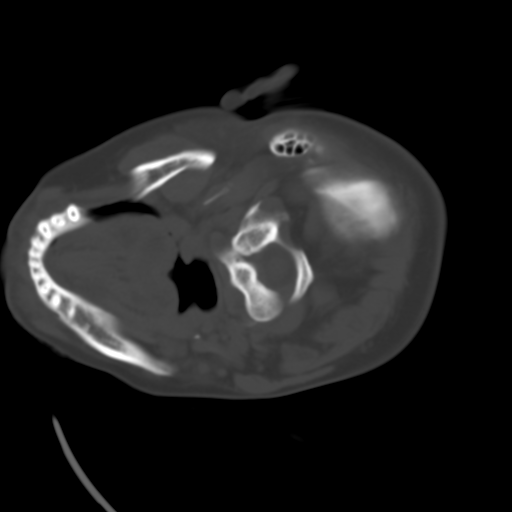
[im 6/35  brain]
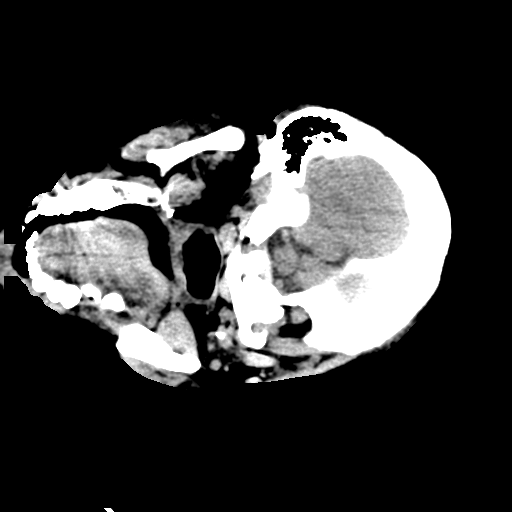
[im 10/35  brain]
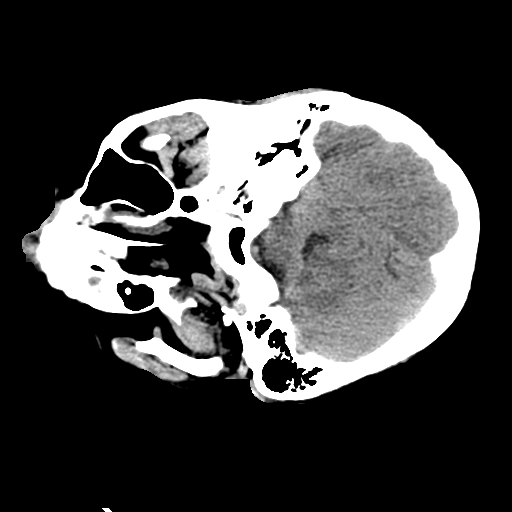
[im 13/35  brain]
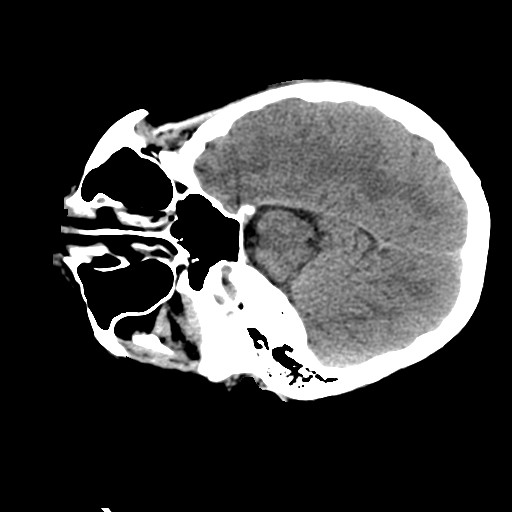
[im 18/35  brain]
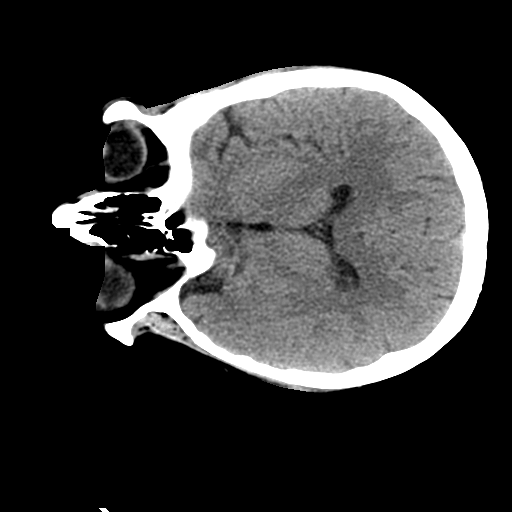
[im 18/35  bone]
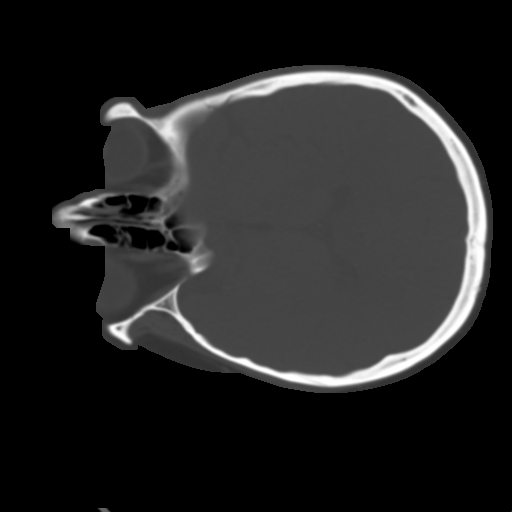
[im 22/35  brain]
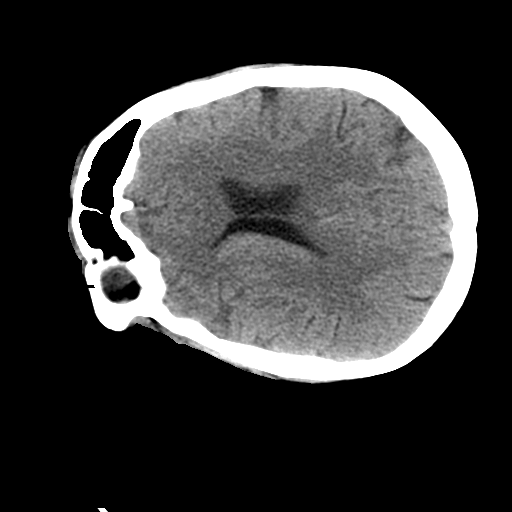
[im 25/35  brain]
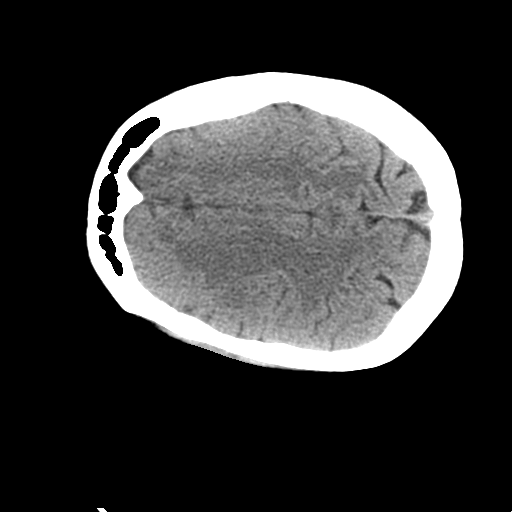
[im 29/35  brain]
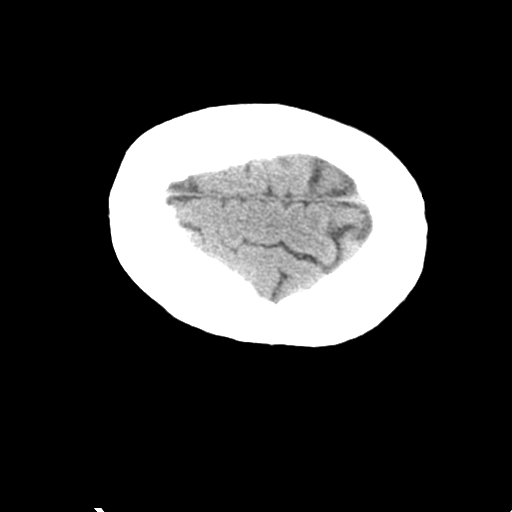
[im 32/35  brain]
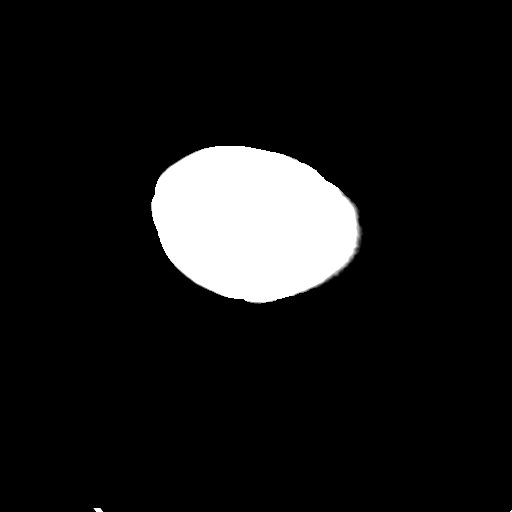
[im 32/35  bone]
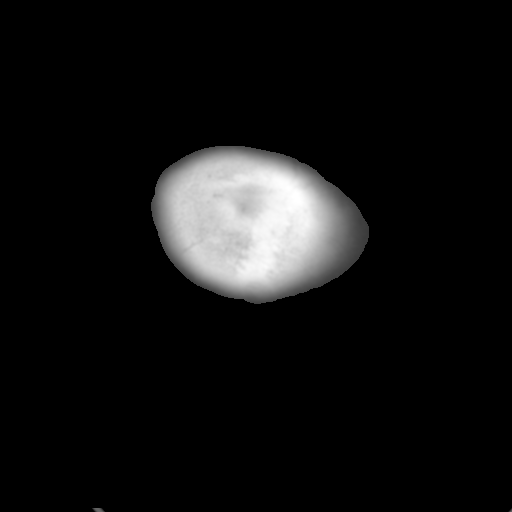

[Series 6: coronal soft tissue · sagittal · 0.27mm/px · 3 of 62 slices shown]
[im 29/62  brain]
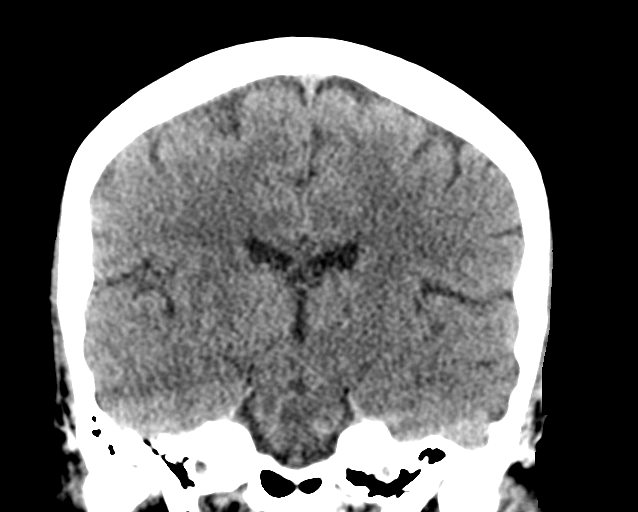
[im 31/62  brain]
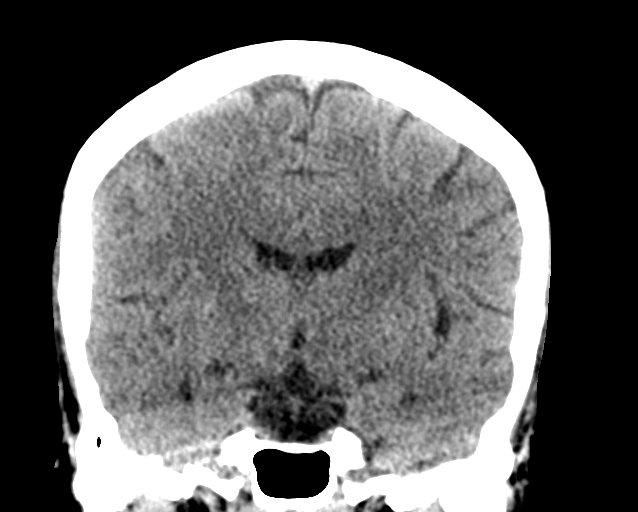
[im 33/62  brain]
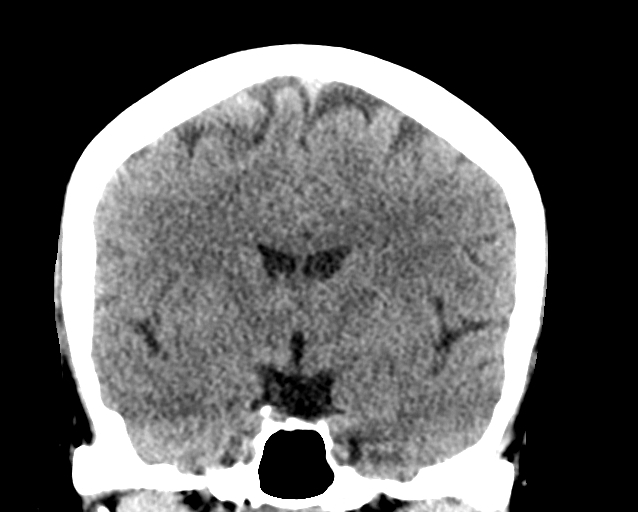

[Series 7: sagittal soft tissue · coronal · 0.30mm/px · 3 of 47 slices shown]
[im 16/47  brain]
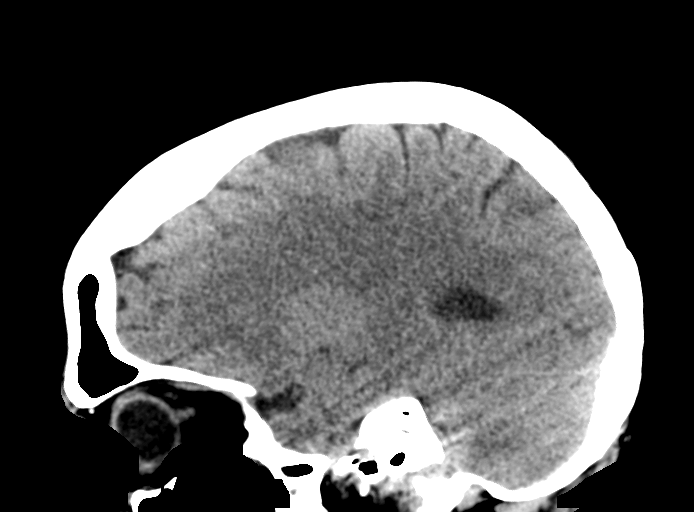
[im 21/47  brain]
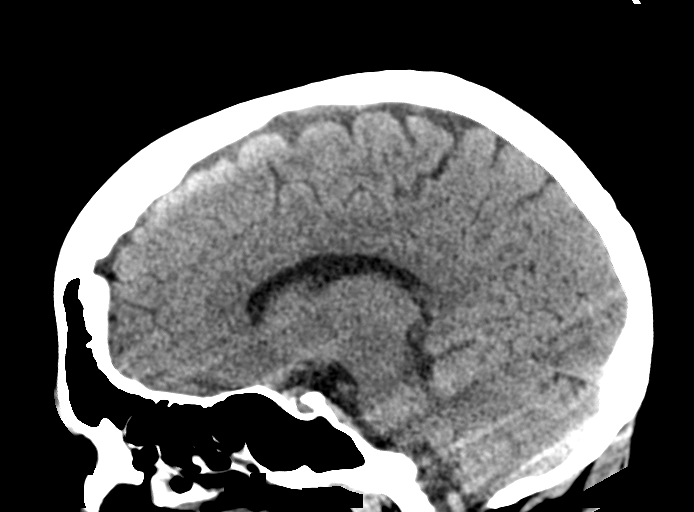
[im 26/47  brain]
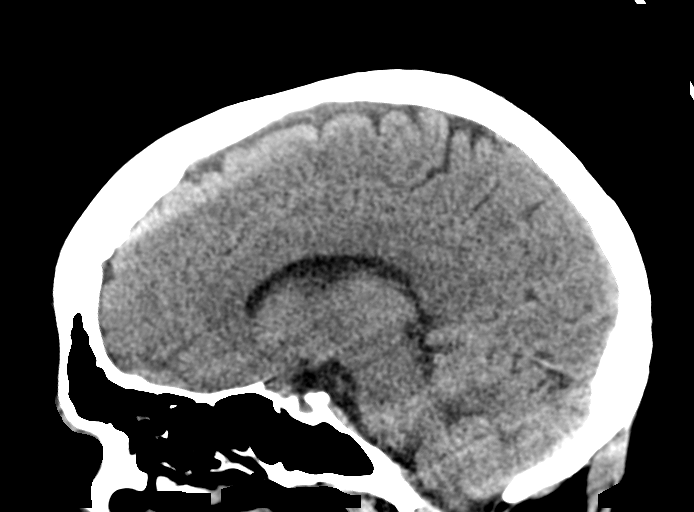

[15 of 47 positions shown; findings below may reference images not displayed]

FINDINGS: Brain: Insert clear brain

Vascular: No hyperdense vessel or unexpected calcification.

Skull: Normal. Negative for fracture or focal lesion.

Sinuses/Orbits: Paranasal sinuses and mastoid air cells are clear.
Orbits are clear.

Other: None
IMPRESSION: no acute intracranial findings.

## 2018-11-16 ENCOUNTER — Other Ambulatory Visit: Payer: Self-pay

## 2018-11-16 ENCOUNTER — Observation Stay
Admission: EM | Admit: 2018-11-16 | Discharge: 2018-11-17 | Disposition: A | Discharge status: Home / Self Care | Payer: Medicare Other | Attending: Internal Medicine | Admitting: Internal Medicine

## 2018-11-16 DIAGNOSIS — R569 Unspecified convulsions: Principal | ICD-10-CM

## 2018-11-16 DIAGNOSIS — F329 Major depressive disorder, single episode, unspecified: Secondary | ICD-10-CM | POA: Diagnosis not present

## 2018-11-16 DIAGNOSIS — K449 Diaphragmatic hernia without obstruction or gangrene: Secondary | ICD-10-CM | POA: Insufficient documentation

## 2018-11-16 DIAGNOSIS — Z86718 Personal history of other venous thrombosis and embolism: Secondary | ICD-10-CM | POA: Insufficient documentation

## 2018-11-16 DIAGNOSIS — Z1159 Encounter for screening for other viral diseases: Secondary | ICD-10-CM | POA: Insufficient documentation

## 2018-11-16 DIAGNOSIS — Z79899 Other long term (current) drug therapy: Secondary | ICD-10-CM | POA: Insufficient documentation

## 2018-11-16 DIAGNOSIS — F79 Unspecified intellectual disabilities: Secondary | ICD-10-CM | POA: Diagnosis not present

## 2018-11-16 DIAGNOSIS — M5136 Other intervertebral disc degeneration, lumbar region: Secondary | ICD-10-CM | POA: Diagnosis not present

## 2018-11-16 DIAGNOSIS — M25511 Pain in right shoulder: Secondary | ICD-10-CM | POA: Diagnosis not present

## 2018-11-16 DIAGNOSIS — F259 Schizoaffective disorder, unspecified: Secondary | ICD-10-CM | POA: Insufficient documentation

## 2018-11-16 DIAGNOSIS — D72829 Elevated white blood cell count, unspecified: Secondary | ICD-10-CM

## 2018-11-16 DIAGNOSIS — F17210 Nicotine dependence, cigarettes, uncomplicated: Secondary | ICD-10-CM | POA: Diagnosis not present

## 2018-11-16 DIAGNOSIS — F1910 Other psychoactive substance abuse, uncomplicated: Secondary | ICD-10-CM | POA: Diagnosis present

## 2018-11-16 LAB — CBC WITH DIFFERENTIAL/PLATELET
Abs Immature Granulocytes: 0.07 10*3/uL (ref 0.00–0.07)
Basophils Absolute: 0.1 10*3/uL (ref 0.0–0.1)
Basophils Relative: 1 %
Eosinophils Absolute: 0 10*3/uL (ref 0.0–0.5)
Eosinophils Relative: 0 %
HCT: 43.1 % (ref 36.0–46.0)
Hemoglobin: 13.6 g/dL (ref 12.0–15.0)
Immature Granulocytes: 1 %
Lymphocytes Relative: 21 %
Lymphs Abs: 3.3 10*3/uL (ref 0.7–4.0)
MCH: 29.6 pg (ref 26.0–34.0)
MCHC: 31.6 g/dL (ref 30.0–36.0)
MCV: 93.7 fL (ref 80.0–100.0)
Monocytes Absolute: 1 10*3/uL (ref 0.1–1.0)
Monocytes Relative: 6 %
Neutro Abs: 11 10*3/uL — ABNORMAL HIGH (ref 1.7–7.7)
Neutrophils Relative %: 71 %
Platelets: 401 10*3/uL — ABNORMAL HIGH (ref 150–400)
RBC: 4.6 MIL/uL (ref 3.87–5.11)
RDW: 13.1 % (ref 11.5–15.5)
WBC: 15.4 10*3/uL — ABNORMAL HIGH (ref 4.0–10.5)
nRBC: 0 % (ref 0.0–0.2)

## 2018-11-16 LAB — URINE DRUG SCREEN, QUALITATIVE (ARMC ONLY)
Amphetamines, Ur Screen: NOT DETECTED
Barbiturates, Ur Screen: NOT DETECTED
Benzodiazepine, Ur Scrn: POSITIVE — AB
Cannabinoid 50 Ng, Ur ~~LOC~~: POSITIVE — AB
Cocaine Metabolite,Ur ~~LOC~~: NOT DETECTED
MDMA (Ecstasy)Ur Screen: NOT DETECTED
Methadone Scn, Ur: NOT DETECTED
Opiate, Ur Screen: POSITIVE — AB
Phencyclidine (PCP) Ur S: NOT DETECTED
Tricyclic, Ur Screen: POSITIVE — AB

## 2018-11-16 LAB — COMPREHENSIVE METABOLIC PANEL
ALT: 13 U/L (ref 0–44)
AST: 28 U/L (ref 15–41)
Albumin: 3.9 g/dL (ref 3.5–5.0)
Alkaline Phosphatase: 94 U/L (ref 38–126)
Anion gap: 23 — ABNORMAL HIGH (ref 5–15)
BUN: 10 mg/dL (ref 6–20)
CO2: 14 mmol/L — ABNORMAL LOW (ref 22–32)
Calcium: 9.1 mg/dL (ref 8.9–10.3)
Chloride: 102 mmol/L (ref 98–111)
Creatinine, Ser: 0.85 mg/dL (ref 0.44–1.00)
GFR calc Af Amer: >60 mL/min (ref >60)
GFR calc non Af Amer: >60 mL/min (ref >60)
Glucose, Bld: 197 mg/dL — ABNORMAL HIGH (ref 70–99)
Potassium: 3.2 mmol/L — ABNORMAL LOW (ref 3.5–5.1)
Sodium: 139 mmol/L (ref 135–145)
Total Bilirubin: 0.8 mg/dL (ref 0.3–1.2)
Total Protein: 7.8 g/dL (ref 6.5–8.1)

## 2018-11-16 LAB — POCT PREGNANCY, URINE: Preg Test, Ur: NEGATIVE

## 2018-11-16 LAB — URINALYSIS, COMPLETE (UACMP) WITH MICROSCOPIC
Bilirubin Urine: NEGATIVE
Glucose, UA: NEGATIVE mg/dL
Hgb urine dipstick: NEGATIVE
Ketones, ur: NEGATIVE mg/dL
Leukocytes,Ua: NEGATIVE
Nitrite: NEGATIVE
Protein, ur: 30 mg/dL — AB
Specific Gravity, Urine: 1.017 (ref 1.005–1.030)
pH: 5 (ref 5.0–8.0)

## 2018-11-16 LAB — PREGNANCY, URINE: Preg Test, Ur: NEGATIVE

## 2018-11-16 MED ORDER — QUETIAPINE FUMARATE 300 MG PO TABS: 600.0000 mg | ORAL_TABLET | Freq: Every day | ORAL | Status: DC

## 2018-11-16 NOTE — ED Notes (Signed)
ED TO INPATIENT HANDOFF REPORT  ED Nurse Name and Phone #: Vega Withrow [phone]   S Name/Age/Gender [name] Page Torrens 38 y.o. female Room/Bed: ED10A/ED10A  Code Status   Code Status: Prior  Home/SNF/Other Home Patient oriented to: self, place, time and situation Is this baseline? Yes   Triage Complete: Triage complete  Chief Complaint seizure  Triage Note Patient with multiple seizures today. 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 All extremities involved. Patient has bitten her tongue; without loss of bladder/bowel. Patient currently postictal.    Allergies Allergies  Allergen Reactions  . Latex Rash and Swelling    Other reaction(s): Unknown Skin turns red  . Morphine Other (See Comments) and Swelling    Patient states that her reaction is that her skin gets a bit red.   No urticaria or airway difficulty.    . Prednisone Other (See Comments)    Other reaction(s): Other (See Comments) Stayed up 30 days when pt. Was on prednisone insomnia  . Buprenorphine Hcl-Naloxone Hcl   . Duloxetine Other (See Comments)    Unknown  . Ketorolac Tromethamine   . Tramadol     Other reaction(s): Other (See Comments) Seizures  . Poison Ivy Extract [Poison Ivy Extract] Rash  . Poison Oak Extract [Poison Oak Extract] Rash  . Sumac Rash    Level of Care/Admitting Diagnosis ED Disposition    ED Disposition Condition Comment   Admit  Hospital Area: [facility] [[id number]]  Level of Care: Med-Surg [16]  Covid Evaluation: N/A  Diagnosis: Seizures (HCC) [801655]  Admitting Physician: Willadean Carol DODD [[id number]]  Attending Physician: Willadean Carol DODD [[id number]]  PT Class (Do Not Modify): Observation [104]  PT Acc Code (Do Not Modify): Observation [10022]       B Medical/Surgery History Past Medical History:  Diagnosis Date  . Degeneration of lumbar intervertebral disc   . Depression   . DVT (deep vein thrombosis) in pregnancy (HCC)   . Schizoaffective  disorder (HCC)   . Seizures (HCC)   . Shoulder pain, right    Past Surgical History:  Procedure Laterality Date  . CESAREAN SECTION     X2     A IV Location/Drains/Wounds Patient Lines/Drains/Airways Status   Active Line/Drains/Airways    Name:   Placement date:   Placement time:   Site:   Days:   Peripheral IV 11/16/18 Left Arm   11/16/18    2026    Arm   less than 1          Intake/Output Last 24 hours No intake or output data in the 24 hours ending 11/16/18 2339  Labs/Imaging Results for orders placed or performed during the hospital encounter of 11/16/18 (from the past 48 hour(s))  Comprehensive metabolic panel     Status: Abnormal   Collection Time: 11/16/18  8:29 PM  Result Value Ref Range   Sodium 139 135 - 145 mmol/L    Comment: LYTES REPEATED MLK   Potassium 3.2 (L) 3.5 - 5.1 mmol/L    Comment: HEMOLYSIS AT THIS LEVEL MAY AFFECT RESULT   Chloride 102 98 - 111 mmol/L   CO2 14 (L) 22 - 32 mmol/L   Glucose, Bld 197 (H) 70 - 99 mg/dL   BUN 10 6 - 20 mg/dL   Creatinine, Ser 9.20 0.44 - 1.00 mg/dL   Calcium 9.1 8.9 - 10.0 mg/dL   Total Protein 7.8 6.5 - 8.1 g/dL   Albumin 3.9 3.5 - 5.0 g/dL  AST 28 15 - 41 U/L   ALT 13 0 - 44 U/L   Alkaline Phosphatase 94 38 - 126 U/L   Total Bilirubin 0.8 0.3 - 1.2 mg/dL   GFR calc non Af Amer >60 >60 mL/min   GFR calc Af Amer >60 >60 mL/min   Anion gap 23 (H) 5 - 15    Comment: Performed at [facility], [address] [place], [place] [address]  CBC with Differential     Status: Abnormal   Collection Time: 11/16/18  8:29 PM  Result Value Ref Range   WBC 15.4 (H) 4.0 - 10.5 K/uL   RBC 4.60 3.87 - 5.11 MIL/uL   Hemoglobin 13.6 12.0 - 15.0 g/dL   HCT 60.443.1 54.036.0 - 98.146.0 %   MCV 93.7 80.0 - 100.0 fL   MCH 29.6 26.0 - 34.0 pg   MCHC 31.6 30.0 - 36.0 g/dL   RDW 19.113.1 47.811.5 - 29.515.5 %   Platelets 401 (H) 150 - 400 K/uL   nRBC 0.0 0.0 - 0.2 %   Neutrophils Relative % 71 %   Neutro Abs 11.0 (H) 1.7 - 7.7 K/uL    Lymphocytes Relative 21 %   Lymphs Abs 3.3 0.7 - 4.0 K/uL   Monocytes Relative 6 %   Monocytes Absolute 1.0 0.1 - 1.0 K/uL   Eosinophils Relative 0 %   Eosinophils Absolute 0.0 0.0 - 0.5 K/uL   Basophils Relative 1 %   Basophils Absolute 0.1 0.0 - 0.1 K/uL   Immature Granulocytes 1 %   Abs Immature Granulocytes 0.07 0.00 - 0.07 K/uL    Comment: Performed at [facility], [address] [place], [place] [address]  Urinalysis, Complete w Microscopic     Status: Abnormal   Collection Time: 11/16/18  8:56 PM  Result Value Ref Range   Color, Urine YELLOW (A) YELLOW   APPearance CLOUDY (A) CLEAR   Specific Gravity, Urine 1.017 1.005 - 1.030   pH 5.0 5.0 - 8.0   Glucose, UA NEGATIVE NEGATIVE mg/dL   Hgb urine dipstick NEGATIVE NEGATIVE   Bilirubin Urine NEGATIVE NEGATIVE   Ketones, ur NEGATIVE NEGATIVE mg/dL   Protein, ur 30 (A) NEGATIVE mg/dL   Nitrite NEGATIVE NEGATIVE   Leukocytes,Ua NEGATIVE NEGATIVE   RBC / HPF 0-5 0 - 5 RBC/hpf   WBC, UA 0-5 0 - 5 WBC/hpf   Bacteria, UA RARE (A) NONE SEEN   Squamous Epithelial / LPF 0-5 0 - 5   Mucus PRESENT     Comment: Performed at [facility], [address] [place], [place] [address]  Pregnancy, urine     Status: None   Collection Time: 11/16/18  8:56 PM  Result Value Ref Range   Preg Test, Ur NEGATIVE NEGATIVE    Comment: Performed at [facility], [address] [place], [place] [address]  Urine Drug Screen, Qualitative     Status: Abnormal   Collection Time: 11/16/18  8:56 PM  Result Value Ref Range   Tricyclic, Ur Screen POSITIVE (A) NONE DETECTED   Amphetamines, Ur Screen NONE DETECTED NONE DETECTED   MDMA (Ecstasy)Ur Screen NONE DETECTED NONE DETECTED   Cocaine Metabolite,Ur [place] NONE DETECTED NONE DETECTED   Opiate, Ur Screen POSITIVE (A) NONE DETECTED   Phencyclidine (PCP) Ur S NONE DETECTED NONE DETECTED   Cannabinoid 50 Ng, Ur [place] POSITIVE (A) NONE DETECTED   Barbiturates, Ur Screen NONE  DETECTED NONE DETECTED   Benzodiazepine, Ur Scrn POSITIVE (A) NONE  DETECTED   Methadone Scn, Ur NONE DETECTED NONE DETECTED    Comment: (NOTE) Tricyclics + metabolites, urine    Cutoff 1000 ng/mL Amphetamines + metabolites, urine  Cutoff 1000 ng/mL MDMA (Ecstasy), urine              Cutoff 500 ng/mL Cocaine Metabolite, urine          Cutoff 300 ng/mL Opiate + metabolites, urine        Cutoff 300 ng/mL Phencyclidine (PCP), urine         Cutoff 25 ng/mL Cannabinoid, urine                 Cutoff 50 ng/mL Barbiturates + metabolites, urine  Cutoff 200 ng/mL Benzodiazepine, urine              Cutoff 200 ng/mL Methadone, urine                   Cutoff 300 ng/mL The urine drug screen provides only a preliminary, unconfirmed analytical test result and should not be used for non-medical purposes. Clinical consideration and professional judgment should be applied to any positive drug screen result due to possible interfering substances. A more specific alternate chemical method must be used in order to obtain a confirmed analytical result. Gas chromatography / mass spectrometry (GC/MS) is the preferred confirmat ory method. Performed at [facility], [address] [place], [place] [address]   Pregnancy, urine POC     Status: None   Collection Time: 11/16/18  8:59 PM  Result Value Ref Range   Preg Test, Ur NEGATIVE NEGATIVE    Comment:        THE SENSITIVITY OF THIS METHODOLOGY IS >24 mIU/mL    No results found.  Pending Labs Unresulted Labs (From admission, onward)    Start     Ordered   11/16/18 2311  SARS Coronavirus 2 (CEPHEID - Performed in [facility] lab), [facility] Order  (Asymptomatic Patients Labs)  ONCE - STAT,   STAT    Question:  Rule Out  Answer:  Yes   11/16/18 2310   11/16/18 2033  Levetiracetam level  Once,   STAT     11/16/18 2032          Vitals/Pain Today's Vitals   11/16/18 2130 11/16/18 2200 11/16/18 2230 11/16/18 2245  BP: 106/68 103/63  101/64   Pulse: (!) 106 97 89 89  Resp: Temp:      TempSrc:      SpO2: 92%   92%  Weight:        Isolation Precautions No active isolations  Medications Medications - No data to display  Mobility walks with person assist High fall risk   Focused Assessments Neuro Assessment Handoff:  Swallow screen pass? not done         Neuro Assessment:   Neuro Checks:      Last Documented NIHSS Modified Score:   Has TPA been given? No If patient is a Neuro Trauma and patient is going to OR before floor call report to 4N Charge nurse: [phone] or [phone]     R Recommendations: See Admitting Provider Note  Report given to:   Additional Notes:

## 2018-11-16 NOTE — H&P (Signed)
[facility] [place] [facility]   PATIENT NAME: [name]    MR#:  [id number]  DATE OF BIRTH:  01/18/1981  DATE OF ADMISSION:  11/16/2018  PRIMARY CARE PHYSICIAN: Estell HarpinVines, Dain, MD   REQUESTING/REFERRING PHYSICIAN: Dorothea GlassmanPaul Malinda, MD  CHIEF COMPLAINT:   Chief Complaint  Patient presents with  . Seizures    HISTORY OF PRESENT ILLNESS:  [name]  is a 38 y.o. female with a known history of depression, seizures, schizoaffective disorder, and history of polysubstance abuse who presented to the ED with two seizures during the day today.  On my exam, patient is postictal and mildly confused, so history is obtained from ED physician and from the chart.  She apparently missed a couple of doses of Keppra recently.  She denies any chest pain, shortness of breath, abdominal pain, fevers, chills.  In the ED, she was initially tachycardic with heart rates to 120, but her heart rates improved to the 80s with IV fluids.  Labs were significant for potassium 3.2, WBC 15.4.  UA was unremarkable.  UDS was positive for benzos, marijuana, opiates, and TCAs.  Hospitalists were called for admission.  PAST MEDICAL HISTORY:   Past Medical History:  Diagnosis Date  . Degeneration of lumbar intervertebral disc   . Depression   . DVT (deep vein thrombosis) in pregnancy (HCC)   . Schizoaffective disorder (HCC)   . Seizures (HCC)   . Shoulder pain, right     PAST SURGICAL HISTORY:   Past Surgical History:  Procedure Laterality Date  . CESAREAN SECTION     X2    SOCIAL HISTORY:   Social History   Tobacco Use  . Smoking status: Current Every Day Smoker    Packs/day: 1.50    Types: Cigarettes  . Smokeless tobacco: Never Used  Substance Use Topics  . Alcohol use: No    FAMILY HISTORY:   Family History  Problem Relation Age of Onset  . Cancer Mother     DRUG ALLERGIES:   Allergies  Allergen Reactions  . Latex Rash and Swelling    Other reaction(s):  Unknown Skin turns red  . Morphine Other (See Comments) and Swelling    Patient states that her reaction is that her skin gets a bit red.   No urticaria or airway difficulty.    . Prednisone Other (See Comments)    Other reaction(s): Other (See Comments) Stayed up 30 days when pt. Was on prednisone insomnia  . Buprenorphine Hcl-Naloxone Hcl   . Duloxetine Other (See Comments)    Unknown  . Ketorolac Tromethamine   . Tramadol     Other reaction(s): Other (See Comments) Seizures  . Poison Ivy Extract [Poison Ivy Extract] Rash  . Poison Oak Extract [Poison Oak Extract] Rash  . Sumac Rash    REVIEW OF SYSTEMS:   ROS-unable to obtain due to confusion  MEDICATIONS AT HOME:   Prior to Admission medications   Medication Sig Start Date End Date Taking? Authorizing Provider  cyclobenzaprine (FLEXERIL) 10 MG tablet Take 10 mg by mouth 2 (two) times daily. 01/13/17   [provider]  lamoTRIgine (LAMICTAL) 25 MG tablet Take 50 mg by mouth daily. 07/01/15   [provider]  levETIRAcetam (KEPPRA) 1000 MG tablet Take 1,000 mg by mouth 2 (two) times daily. 01/19/17   [provider]  [name] 3-0.02 MG tablet Take 1 tablet by mouth daily. 01/16/17   [provider]  Oxycodone HCl 10 MG TABS  Take 10 mg by mouth 3 (three) times daily. 01/15/17   [provider]  PARoxetine (PAXIL) 20 MG tablet Take 1 tablet (20 mg total) by mouth at bedtime. 01/22/17   Emily Filbert, MD  promethazine (PHENERGAN) 25 MG tablet Take 1 tablet (25 mg total) by mouth every 8 (eight) hours as needed for nausea or vomiting. Patient not taking: Reported on 01/24/2017 08/09/16   Ovid Curd P, PA-C  QUEtiapine (SEROQUEL) 300 MG tablet Take 2 tablets (600 mg total) by mouth at bedtime. 04/25/16   Clapacs, Jackquline Denmark, MD  traZODone (DESYREL) 50 MG tablet Take 1 tablet (50 mg total) by mouth at bedtime. Patient not taking: Reported on 01/24/2017 08/09/16   Lutricia Feil, PA-C       VITAL SIGNS:  Blood pressure 101/64, pulse 89, temperature 99.2 F (37.3 C), temperature source Axillary, resp. rate 13, weight 78.5 kg, SpO2 92 %.  PHYSICAL EXAMINATION:  Physical Exam  GENERAL:  38 y.o.-year-old patient lying in the bed with no acute distress.  EYES: Pupils equal and dilated, reactive to light. No scleral icterus. Extraocular muscles intact.  HEENT: Head atraumatic, normocephalic. Oropharynx and nasopharynx clear.  NECK:  Supple, no jugular venous distention. No thyroid enlargement, no tenderness.  LUNGS: Normal breath sounds bilaterally, no wheezing, rales,rhonchi or crepitation. No use of accessory muscles of respiration.  CARDIOVASCULAR: RRR, S1, S2 normal. No murmurs, rubs, or gallops.  ABDOMEN: Soft, nontender, nondistended. Bowel sounds present. No organomegaly or mass.  EXTREMITIES: No pedal edema, cyanosis, or clubbing.  NEUROLOGIC: Cranial nerves II through XII are intact. Muscle strength 5/5 in all extremities. Sensation intact. Gait not checked.  PSYCHIATRIC: The patient is alert and oriented only to person. SKIN: No obvious rash, lesion, or ulcer.   LABORATORY PANEL:   CBC Recent Labs  Lab 11/16/18 2029  WBC 15.4*  HGB 13.6  HCT 43.1  PLT 401*   ------------------------------------------------------------------------------------------------------------------  Chemistries  Recent Labs  Lab 11/16/18 2029  NA 139  K 3.2*  CL 102  CO2 14*  GLUCOSE 197*  BUN 10  CREATININE 0.85  CALCIUM 9.1  AST 28  ALT 13  ALKPHOS 94  BILITOT 0.8   ------------------------------------------------------------------------------------------------------------------  Cardiac Enzymes No results for input(s): TROPONINI in the last 168 hours. ------------------------------------------------------------------------------------------------------------------  RADIOLOGY:  No results found.    IMPRESSION AND PLAN:   Seizures with a history of seizure  disorder-likely secondary to not taking her Keppra.  -Continue home Lamictal and Keppra -Ativan IV prn seizures -Consider neurology consult if patient continues having seizures here while on home meds -Seizure precautions  Altered mental status- likely due to prolonged postictal state versus drug intoxication.  UDS positive for opiates, marijuana, benzos, and TCAs. -Can consider additional imaging if not improved by the morning  Leukocytosis- likely secondary to seizures.  No signs of active infection.  UA negative. -Check chest x-ray to rule out respiratory illness -Recheck WBC in the morning  Hypokalemia -Replete and recheck  Schizoaffective disorder -Continue paxil  -Holding seroquel in the setting of AMS  Polysubstance abuse -Hold home oxycodone for now, can likely restart in the morning -Nicotine patch prn  All the records are reviewed and case discussed with ED provider. Management plans discussed with the patient, family and they are in agreement.  CODE STATUS: Full  TOTAL TIME TAKING CARE OF THIS PATIENT: 45 minutes.    Jinny Blossom Lanelle Lindo M.D on 11/16/2018 at 11:24 PM  Between 7am to 6pm - Pager - [phone]  After 6pm go to [email or web address] - Social research officer, government  [facility] [place] [facility]  Office  [phone]  CC: Primary care physician; Estell Harpin, MD   Note: This dictation was prepared with Dragon dictation along with smaller phrase technology. Any transcriptional errors that result from this process are unintentional.

## 2018-11-16 NOTE — ED Notes (Signed)
hospitalist in to see pt.

## 2018-11-16 NOTE — ED Notes (Signed)
Pt resting, eyes closed

## 2018-11-16 NOTE — ED Triage Notes (Signed)
Patient with multiple seizures today. 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 All extremities involved. Patient has bitten her tongue; without loss of bladder/bowel. Patient currently postictal.

## 2018-11-16 NOTE — ED Notes (Signed)
Pt calmer, resting, eyes closed.

## 2018-11-16 NOTE — ED Provider Notes (Addendum)
[facility] Emergency Department Provider Note   ____________________________________________   First MD Initiated Contact with Patient 11/16/18 2019     (approximate)  I have reviewed the triage vital signs and the nursing notes.   HISTORY  Chief Complaint Seizures History limited by postictal state.   HPI [name] is a 38 y.o. female who has a history of seizures and schizophrenia.  Previous records says she has uncontrolled seizures.  She takes Keppra.  She apparently skipped the dose by accident and took it today.  She had a seizure and EMS was called in as they were bringing her and she had another seizure.  She is currently unresponsive.  Fingerstick was 183.  He is having no current seizure-like activity.  Review of one previous chart shows that her seizures are sometimes stress related and induced by inability to get her narcotics.         Past Medical History:  Diagnosis Date  . Degeneration of lumbar intervertebral disc   . Depression   . DVT (deep vein thrombosis) in pregnancy (HCC)   . Schizoaffective disorder (HCC)   . Seizures (HCC)   . Shoulder pain, right     Patient Active Problem List   Diagnosis Date Noted  . Seizures (HCC) 11/16/2018  . Intellectual disability 03/24/2016  . 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 Substance-induced delirium (HCC) 03/02/2016  . Altered mental status 03/01/2016  . Seizure (HCC) 03/01/2016  . Hypokalemia 03/01/2016  . Leukocytosis 03/01/2016  . Shoulder pain, right 10/25/2015  . Opiate abuse, episodic (HCC) 10/22/2015    Past Surgical History:  Procedure Laterality Date  . CESAREAN SECTION     X2    Prior to Admission medications   Medication Sig Start Date End Date Taking? Authorizing Provider  cyclobenzaprine (FLEXERIL) 10 MG tablet Take 10 mg by mouth 2 (two) times daily. 01/13/17   [provider]  lamoTRIgine (LAMICTAL) 25 MG tablet Take 50 mg by  mouth daily. 07/01/15   [provider]  levETIRAcetam (KEPPRA) 1000 MG tablet Take 1,000 mg by mouth 2 (two) times daily. 01/19/17   [provider]  [name] 3-0.02 MG tablet Take 1 tablet by mouth daily. 01/16/17   [provider]  Oxycodone HCl 10 MG TABS Take 10 mg by mouth 3 (three) times daily. 01/15/17   [provider]  PARoxetine (PAXIL) 20 MG tablet Take 1 tablet (20 mg total) by mouth at bedtime. 01/22/17   [name], Jonathan E, MD  promethazine (PHENERGAN) 25 MG tablet Take 1 tablet (25 mg total) by mouth every 8 (eight) hours as needed for nausea or vomiting. Patient not taking: Reported on 01/24/2017 08/09/16   Ovid Curdoemer, William P, PA-C  QUEtiapine (SEROQUEL) 300 MG tablet Take 2 tablets (600 mg total) by mouth at bedtime. 04/25/16   Clapacs, Jackquline DenmarkJohn T, MD  traZODone (DESYREL) 50 MG tablet Take 1 tablet (50 mg total) by mouth at bedtime. Patient not taking: Reported on 01/24/2017 08/09/16   Lutricia Feiloemer, William P, PA-C    Allergies Latex; Morphine; Prednisone; Buprenorphine hcl-naloxone hcl; Duloxetine; Ketorolac tromethamine; Tramadol; Poison ivy extract [poison ivy extract]; Poison oak extract [poison oak extract]; and Sumac  Family History  Problem Relation Age of Onset  . Cancer Mother     Social History Social History   Tobacco Use  . Smoking status: Current Every Day Smoker    Packs/day: 1.50    Types: Cigarettes  . Smokeless tobacco: Never Used  Substance Use  Topics  . Alcohol use: No  . Drug use: No    Review of Systems Unable to obtain  ____________________________________________   PHYSICAL EXAM:  VITAL SIGNS: ED Triage Vitals  Enc Vitals Group     BP --      Pulse Rate 11/16/18 2026 (!) 123     Resp 11/16/18 2026 14     Temp 11/16/18 2026 99.2 F (37.3 C)     Temp Source 11/16/18 2026 Axillary     SpO2 11/16/18 2026 100 %     Weight 11/16/18 2023 173 lb 1.6 oz (78.5 kg)     Height --      Head Circumference --      Peak Flow  --      Pain Score --      Pain Loc --      Pain Edu? --      Excl. in GC? --     Constitutional: Unresponsive Eyes: Conjunctivae are normal.  Pupils are equal round reactive doll's eyes are intact Head: Atraumatic. Nose: No congestion/rhinnorhea. Mouth/Throat: Mucous membranes are moist.  Oropharynx non-erythematous.  Patient did bite the left side of her tongue it looks okay near as I can tell its fairly difficult to tell because patient is still postictal and is very difficult to keep her mouth open for long enough to get a good view of the tongue. Neck: No stridor. Cardiovascular: Normal rate, regular rhythm. Grossly normal heart sounds.  Good peripheral circulation. Respiratory: Normal respiratory effort.  No retractions. Lungs CTAB. Gastrointestinal: Soft and nontender. No distention. No abdominal bruits. No CVA tenderness. Musculoskeletal: No lower extremity tenderness nor edema.  Neurologic: Unresponsive.  Normal doll's eyes Skin:  Skin is warm, dry and intact. No rash noted.   ____________________________________________   LABS (all labs ordered are listed, but only abnormal results are displayed)  Labs Reviewed  COMPREHENSIVE METABOLIC PANEL - Abnormal; Notable for the following components:      Result Value   Potassium 3.2 (*)    CO2 14 (*)    Glucose, Bld 197 (*)    Anion gap 23 (*)    All other components within normal limits  URINALYSIS, COMPLETE (UACMP) WITH MICROSCOPIC - Abnormal; Notable for the following components:   Color, Urine YELLOW (*)    APPearance CLOUDY (*)    Protein, ur 30 (*)    Bacteria, UA RARE (*)    All other components within normal limits  URINE DRUG SCREEN, QUALITATIVE ([facility] ONLY) - Abnormal; Notable for the following components:   Tricyclic, Ur Screen POSITIVE (*)    Opiate, Ur Screen POSITIVE (*)    Cannabinoid 50 Ng, Ur [place] POSITIVE (*)    Benzodiazepine, Ur Scrn POSITIVE (*)    All other components within normal limits  CBC WITH  DIFFERENTIAL/PLATELET - Abnormal; Notable for the following components:   WBC 15.4 (*)    Platelets 401 (*)    Neutro Abs 11.0 (*)    All other components within normal limits  SARS CORONAVIRUS 2 (HOSPITAL ORDER, PERFORMED IN [facility])  PREGNANCY, URINE  LEVETIRACETAM LEVEL  POCT PREGNANCY, URINE   ____________________________________________  EKG  KG read interpreted by me shows sinus tachycardia rate of 124 rightward axis nonspecific ST-T wave changes ____________________________________________  RADIOLOGY  ED MD interpretation  Official radiology report(s): No results found.  ____________________________________________   PROCEDURES  Procedure(s) performed (including Critical Care):  Procedures   ____________________________________________   INITIAL IMPRESSION / ASSESSMENT AND PLAN /  ED COURSE ----------------------------------------- 11:14 PM on 11/16/2018 -----------------------------------------  Patient is still very sleepy when she was awake she was confused.  She is still postictal and her drug screen is positive for multiple things.  We will watch her in the hospital for the time being.              ____________________________________________   FINAL CLINICAL IMPRESSION(S) / ED DIAGNOSES  Final diagnoses:  Seizure (HCC)  Polysubstance abuse [facility])     ED Discharge Orders    None       Note:  This document was prepared using Dragon voice recognition software and may include unintentional dictation errors.    Arnaldo Natal, MD 11/16/18 4098    Arnaldo Natal, MD 11/17/18 0000

## 2018-11-16 NOTE — ED Notes (Signed)
Pt combative, fighting staff, repeatedly trying to get out of bed. Alyssa, ed tech at bedside sitting for safety at this time.

## 2018-11-17 ENCOUNTER — Observation Stay: Payer: Medicare Other

## 2018-11-17 DIAGNOSIS — R569 Unspecified convulsions: Secondary | ICD-10-CM

## 2018-11-17 LAB — BASIC METABOLIC PANEL
Anion gap: 9 (ref 5–15)
BUN: 12 mg/dL (ref 6–20)
CO2: 24 mmol/L (ref 22–32)
Calcium: 8.6 mg/dL — ABNORMAL LOW (ref 8.9–10.3)
Chloride: 107 mmol/L (ref 98–111)
Creatinine, Ser: 0.84 mg/dL (ref 0.44–1.00)
GFR calc Af Amer: >60 mL/min (ref >60)
GFR calc non Af Amer: >60 mL/min (ref >60)
Glucose, Bld: 110 mg/dL — ABNORMAL HIGH (ref 70–99)
Potassium: 3.2 mmol/L — ABNORMAL LOW (ref 3.5–5.1)
Sodium: 140 mmol/L (ref 135–145)

## 2018-11-17 LAB — CBC
HCT: 34.8 % — ABNORMAL LOW (ref 36.0–46.0)
Hemoglobin: 11.6 g/dL — ABNORMAL LOW (ref 12.0–15.0)
MCH: 29.7 pg (ref 26.0–34.0)
MCHC: 33.3 g/dL (ref 30.0–36.0)
MCV: 89.2 fL (ref 80.0–100.0)
Platelets: 319 10*3/uL (ref 150–400)
RBC: 3.9 MIL/uL (ref 3.87–5.11)
RDW: 13 % (ref 11.5–15.5)
WBC: 8.1 10*3/uL (ref 4.0–10.5)
nRBC: 0 % (ref 0.0–0.2)

## 2018-11-17 LAB — SARS CORONAVIRUS 2 BY RT PCR (HOSPITAL ORDER, PERFORMED IN ~~LOC~~ HOSPITAL LAB): SARS Coronavirus 2: NEGATIVE

## 2018-11-17 MED ORDER — LEVETIRACETAM 500 MG PO TABS
1000.0000 mg | ORAL_TABLET | Freq: Two times a day (BID) | ORAL | Status: DC
  Administered 2018-11-17 (×2): 1000 mg via ORAL
  Filled 2018-11-17 (×3): qty 2

## 2018-11-17 MED ORDER — OXYCODONE HCL 5 MG PO TABS
10.0000 mg | ORAL_TABLET | Freq: Three times a day (TID) | ORAL | Status: DC
  Administered 2018-11-17 (×2): 10 mg via ORAL
  Filled 2018-11-17 (×2): qty 2

## 2018-11-17 MED ORDER — ACETAMINOPHEN 650 MG RE SUPP: 650.0000 mg | Freq: Four times a day (QID) | RECTAL | Status: DC | PRN

## 2018-11-17 MED ORDER — GADOBUTROL 1 MMOL/ML IV SOLN
7.0000 mL | Freq: Once | INTRAVENOUS | Status: AC | PRN
  Administered 2018-11-17: 7 mL via INTRAVENOUS

## 2018-11-17 MED ORDER — POLYETHYLENE GLYCOL 3350 17 G PO PACK: 17.0000 g | PACK | Freq: Every day | ORAL | Status: DC | PRN

## 2018-11-17 MED ORDER — DROSPIRENONE-ETHINYL ESTRADIOL 3-0.02 MG PO TABS: 1.0000 | ORAL_TABLET | Freq: Every day | ORAL | Status: DC

## 2018-11-17 MED ORDER — POTASSIUM CHLORIDE CRYS ER 20 MEQ PO TBCR
40.0000 meq | EXTENDED_RELEASE_TABLET | Freq: Once | ORAL | Status: AC
  Administered 2018-11-17: 40 meq via ORAL
  Filled 2018-11-17: qty 2

## 2018-11-17 MED ORDER — ONDANSETRON HCL 4 MG PO TABS: 4.0000 mg | ORAL_TABLET | Freq: Four times a day (QID) | ORAL | Status: DC | PRN

## 2018-11-17 MED ORDER — LEVETIRACETAM 750 MG PO TABS: 1500.0000 mg | ORAL_TABLET | Freq: Two times a day (BID) | ORAL | 0 refills | Status: DC

## 2018-11-17 MED ORDER — LORAZEPAM 2 MG/ML IJ SOLN: 2.0000 mg | INTRAMUSCULAR | Status: DC | PRN

## 2018-11-17 MED ORDER — PAROXETINE HCL 20 MG PO TABS
20.0000 mg | ORAL_TABLET | Freq: Every day | ORAL | Status: DC
  Filled 2018-11-17: qty 1

## 2018-11-17 MED ORDER — NICOTINE 14 MG/24HR TD PT24: 14.0000 mg | MEDICATED_PATCH | Freq: Every day | TRANSDERMAL | Status: DC | PRN

## 2018-11-17 MED ORDER — CYCLOBENZAPRINE HCL 10 MG PO TABS: 10.0000 mg | ORAL_TABLET | Freq: Two times a day (BID) | ORAL | Status: DC

## 2018-11-17 MED ORDER — LAMOTRIGINE 25 MG PO TABS
50.0000 mg | ORAL_TABLET | Freq: Every day | ORAL | Status: DC
  Administered 2018-11-17: 50 mg via ORAL
  Filled 2018-11-17: qty 2

## 2018-11-17 MED ORDER — ONDANSETRON HCL 4 MG/2ML IJ SOLN: 4.0000 mg | Freq: Four times a day (QID) | INTRAMUSCULAR | Status: DC | PRN

## 2018-11-17 MED ORDER — ENOXAPARIN SODIUM 40 MG/0.4ML ~~LOC~~ SOLN
40.0000 mg | SUBCUTANEOUS | Status: DC
  Filled 2018-11-17: qty 0.4

## 2018-11-17 MED ORDER — ACETAMINOPHEN 325 MG PO TABS: 650.0000 mg | ORAL_TABLET | Freq: Four times a day (QID) | ORAL | Status: DC | PRN

## 2018-11-17 MED ORDER — LEVETIRACETAM 750 MG PO TABS
1500.0000 mg | ORAL_TABLET | Freq: Two times a day (BID) | ORAL | Status: DC
  Filled 2018-11-17: qty 2

## 2018-11-17 NOTE — Care Management Obs Status (Signed)
MEDICARE OBSERVATION STATUS NOTIFICATION   Patient Details  Name: [name] MRN: [id number] Date of Birth: 04/28/1981   Medicare Observation Status Notification Given:  Yes    Kaleab Frasier A Simonne Boulos, RN 11/17/2018, 9:04 AM

## 2018-11-17 NOTE — Progress Notes (Signed)
Spoke to MRI per Dr. Larose Hires request. They are currently backed up on cases b/c of the number of requests they have from the ED. John said they would get to our patient as soon as possible.

## 2018-11-17 NOTE — Consult Note (Signed)
Reason for Consult: seizures Referring Physician: Dr. Nancy Marus  CC: seizure  HPI: [name] is an 38 y.o. female  female with a known history of depression, seizures, schizoaffective disorder, and history of polysubstance abuse who presented to the ED with two seizures during the day yesterday. Patient is on Keppra 1gm BID and Lamictal 50 daily.  States she is compliant with her medications even though a very poor historian. Coul not tell me a clear history about her seizures.  States had another seizures a few days ago but couldn't tell me what the symptoms were. When questioned she states "they are bad" Does not see neurologist.    Past Medical History:  Diagnosis Date  . Degeneration of lumbar intervertebral disc   . Depression   . DVT (deep vein thrombosis) in pregnancy   . Schizoaffective disorder (HCC)   . Seizures (HCC)   . Shoulder pain, right     Past Surgical History:  Procedure Laterality Date  . CESAREAN SECTION     X2    Family History  Problem Relation Age of Onset  . Cancer Mother     Social History:  reports that she has been smoking cigarettes. She has been smoking about 1.50 packs per day. She has never used smokeless tobacco. She reports that she does not drink alcohol or use drugs.  Allergies  Allergen Reactions  . Latex Rash and Swelling    Other reaction(s): Unknown Skin turns red  . Morphine Other (See Comments) and Swelling    Patient states that her reaction is that her skin gets a bit red.   No urticaria or airway difficulty.    . Prednisone Other (See Comments)    Other reaction(s): Other (See Comments) Stayed up 30 days when pt. Was on prednisone insomnia  . Buprenorphine Hcl-Naloxone Hcl   . Duloxetine Other (See Comments)    Unknown Unknown  . Ketorolac Tromethamine   . Lithium     drool  . Tramadol     Other reaction(s): Other (See Comments) Seizures  . Poison Ivy Extract [Poison Ivy Extract] Rash  . Poison Oak Extract [Poison  Oak Extract] Rash  . Sumac Rash    Medications: I have reviewed the patient's current medications.  ROS: Unable to obtain clear one due to being a poor hisotrian   Physical Examination: Blood pressure 107/69, pulse 70, temperature 97.7 F (36.5 C), temperature source Oral, resp. rate 14, height 5' 0.98" (1.549 m), weight 78.5 kg, SpO2 96 %.   Neurological Examination   Mental Status: Alert, oriented to name and location Cranial Nerves: II: Discs flat bilaterally; Visual fields grossly normal, pupils equal, round, reactive to light and accommodation III,IV, VI: ptosis not present, extra-ocular motions intact bilaterally V,VII: smile symmetric, facial light touch sensation normal bilaterally VIII: hearing normal bilaterally IX,X: gag reflex present XI: bilateral shoulder shrug XII: midline tongue extension Motor: Right : Upper extremity   5/5    Left:     Upper extremity   5/5  Lower extremity   5/5     Lower extremity   5/5 Tone and bulk:normal tone throughout; no atrophy noted Sensory: Pinprick and light touch intact throughout, bilaterally Deep Tendon Reflexes: 2+ and symmetric throughout Plantars: Right: downgoing   Left: downgoing Cerebellar: Not tested Gait: not tested       Laboratory Studies:   Basic Metabolic Panel: Recent Labs  Lab 11/16/18 2029 11/17/18 0129  NA 139 140  K 3.2* 3.2*  CL 102  107  CO2 14* 24  GLUCOSE 197* 110*  BUN 10 12  CREATININE 0.85 0.84  CALCIUM 9.1 8.6*    Liver Function Tests: Recent Labs  Lab 11/16/18 2029  AST 28  ALT 13  ALKPHOS 94  BILITOT 0.8  PROT 7.8  ALBUMIN 3.9   No results for input(s): LIPASE, AMYLASE in the last 168 hours. No results for input(s): AMMONIA in the last 168 hours.  CBC: Recent Labs  Lab 11/16/18 2029 11/17/18 0129  WBC 15.4* 8.1  NEUTROABS 11.0*  --   HGB 13.6 11.6*  HCT 43.1 34.8*  MCV 93.7 89.2  PLT 401* 319    Cardiac Enzymes: No results for input(s): CKTOTAL, CKMB,  CKMBINDEX, TROPONINI in the last 168 hours.  BNP: Invalid input(s): POCBNP  CBG: No results for input(s): GLUCAP in the last 168 hours.  Microbiology: Results for orders placed or performed during the hospital encounter of 11/16/18  SARS Coronavirus 2 (CEPHEID - Performed in [facility] hospital lab), Hosp Order     Status: None   Collection Time: 11/16/18 11:11 PM  Result Value Ref Range Status   SARS Coronavirus 2 NEGATIVE NEGATIVE Final    Comment: (NOTE) If result is NEGATIVE SARS-CoV-2 target nucleic acids are NOT DETECTED. The SARS-CoV-2 RNA is generally detectable in upper and lower  respiratory specimens during the acute phase of infection. The lowest  concentration of SARS-CoV-2 viral copies this assay can detect is 250  copies / mL. A negative result does not preclude SARS-CoV-2 infection  and should not be used as the sole basis for treatment or other  patient management decisions.  A negative result may occur with  improper specimen collection / handling, submission of specimen other  than nasopharyngeal swab, presence of viral mutation(s) within the  areas targeted by this assay, and inadequate number of viral copies  (<250 copies / mL). A negative result must be combined with clinical  observations, patient history, and epidemiological information. If result is POSITIVE SARS-CoV-2 target nucleic acids are DETECTED. The SARS-CoV-2 RNA is generally detectable in upper and lower  respiratory specimens dur ing the acute phase of infection.  Positive  results are indicative of active infection with SARS-CoV-2.  Clinical  correlation with patient history and other diagnostic information is  necessary to determine patient infection status.  Positive results do  not rule out bacterial infection or co-infection with other viruses. If result is PRESUMPTIVE POSTIVE SARS-CoV-2 nucleic acids MAY BE PRESENT.   A presumptive positive result was obtained on the submitted specimen   and confirmed on repeat testing.  While 2019 novel coronavirus  (SARS-CoV-2) nucleic acids may be present in the submitted sample  additional confirmatory testing may be necessary for epidemiological  and / or clinical management purposes  to differentiate between  SARS-CoV-2 and other Sarbecovirus currently known to infect humans.  If clinically indicated additional testing with an alternate test  methodology [phone]) is advised. The SARS-CoV-2 RNA is generally  detectable in upper and lower respiratory sp ecimens during the acute  phase of infection. The expected result is Negative. Fact Sheet for Patients:  BoilerBrush.com.cy Fact Sheet for Healthcare Providers: https://pope.com/ This test is not yet approved or cleared by the [place] FDA and has been authorized for detection and/or diagnosis of SARS-CoV-2 by FDA under an Emergency Use Authorization (EUA).  This EUA will remain in effect (meaning this test can be used) for the duration of the COVID-19 declaration under Section 564(b)(1) of the Act,  21 U.S.C. section 360bbb-3(b)(1), unless the authorization is terminated or revoked sooner. Performed at [facility], [address] [place], [place] [address]     Coagulation Studies: No results for input(s): LABPROT, INR in the last 72 hours.  Urinalysis:  Recent Labs  Lab 11/16/18 2056  COLORURINE YELLOW*  LABSPEC 1.017  PHURINE 5.0  GLUCOSEU NEGATIVE  HGBUR NEGATIVE  BILIRUBINUR NEGATIVE  KETONESUR NEGATIVE  PROTEINUR 30*  NITRITE NEGATIVE  LEUKOCYTESUR NEGATIVE    Lipid Panel:     Component Value Date/Time   CHOL 202 (H) 03/25/2016 0645   TRIG 154 (H) 03/25/2016 0645   HDL 55 03/25/2016 0645   CHOLHDL 3.7 03/25/2016 0645   VLDL 31 03/25/2016 0645   LDLCALC 116 (H) 03/25/2016 0645    HgbA1C:  Lab Results  Component Value Date   HGBA1C 4.7 (L) 03/25/2016    Urine Drug Screen:       Component Value Date/Time   LABOPIA POSITIVE (A) 11/16/2018 2056   COCAINSCRNUR NONE DETECTED 11/16/2018 2056   LABBENZ POSITIVE (A) 11/16/2018 2056   AMPHETMU NONE DETECTED 11/16/2018 2056   THCU POSITIVE (A) 11/16/2018 2056   LABBARB NONE DETECTED 11/16/2018 2056    Alcohol Level: No results for input(s): ETH in the last 168 hours.  Other results: EKG: normal EKG, normal sinus rhythm, unchanged from previous tracings.  Imaging: Portable Chest 1 View  Result Date: 11/17/2018 CLINICAL DATA:  Leukocytosis EXAM: PORTABLE CHEST 1 VIEW COMPARISON:  04/23/16 FINDINGS: Cardiac shadow is enlarged but accentuated by the portable technique. Poor inspiratory effort is noted. No focal confluent infiltrate is seen. Mild interstitial markings are noted which may represent some bronchitis. Hiatal hernia is noted. IMPRESSION: Mild bronchitic changes. Electronically Signed   By: Alcide CleverMark  Lukens M.D.   On: 11/17/2018 01:35     Assessment/Plan:  38 y.o. female  female with a known history of depression, seizures, schizoaffective disorder, and history of polysubstance abuse who presented to the ED with two seizures during the day yesterday. Patient is on Keppra 1gm BID and Lamictal 50 daily.  States she is compliant with her medications even though a very poor historian. Coul not tell me a clear history about her seizures.  States had another seizures a few days ago but couldn't tell me what the symptoms were. When questioned she states "they are bad" Does not see neurologist.    - she is seeking pain medications and keeps on asking for oxycodone. Possibly other opiate use that would lower sz threshold - states compliant with meds even though not convinced - Increased Keppra 1500 BID - same dose of lamictal - MRI brain with and without contrast as I don't see one - needs neurology follow up as out pt.  11/17/2018, 10:44 AM

## 2018-11-17 NOTE — Progress Notes (Signed)
Patient's medications returned to her from pharmacy.

## 2018-11-17 NOTE — Progress Notes (Signed)
Patient is being discharged to home. Family member will come to pick up. IV removed, DC & Rx instructions given. Patient states she understands her new dosages on the seizure med. Belongings returned and packed up.

## 2018-11-18 LAB — HIV ANTIBODY (ROUTINE TESTING W REFLEX): HIV Screen 4th Generation wRfx: NONREACTIVE

## 2018-11-18 NOTE — Discharge Summary (Signed)
[facility] [place] at [facility]   PATIENT NAME: [name]    MR#:  [id number]  DATE OF BIRTH:  [birth year]  DATE OF ADMISSION:  11/16/2018 ADMITTING PHYSICIAN: Campbell Stall, MD  DATE OF DISCHARGE: 11/17/2018  7:02 PM  PRIMARY CARE PHYSICIAN: Estell Harpin, MD    ADMISSION DIAGNOSIS:  Seizure (HCC) [R56.9] Polysubstance abuse (HCC) [F19.10] Seizures (HCC) [R56.9]  DISCHARGE DIAGNOSIS:  Active Problems:   Seizures (HCC)   SECONDARY DIAGNOSIS:   Past Medical History:  Diagnosis Date  . Degeneration of lumbar intervertebral disc   . Depression   . DVT (deep vein thrombosis) in pregnancy   . Schizoaffective disorder (HCC)   . Seizures (HCC)   . Shoulder pain, right     HOSPITAL COURSE:   Patient came with seizure episodes and was claiming her compliance to seizure medications at home.  Though it was questionable due to her drug abuse behavior and that itself could also lower the seizure threshold. She did not had seizures in the hospital.  MRI on the brain was negative for any acute abnormalities.  She was seen by neurologist who suggested to increase the dose of Keppra.  We counseled her to stop abusing other drugs and discharge her home with advised to follow-up with neurologist in 1 to 2 weeks.  DISCHARGE CONDITIONS:   Stable.  CONSULTS OBTAINED:  Treatment Team:  Pauletta Browns, MD  DRUG ALLERGIES:   Allergies  Allergen Reactions  . Latex Rash and Swelling    Other reaction(s): Unknown Skin turns red  . Morphine Other (See Comments) and Swelling    Patient states that her reaction is that her skin gets a bit red.   No urticaria or airway difficulty.    . Prednisone Other (See Comments)    Other reaction(s): Other (See Comments) Stayed up 30 days when pt. Was on prednisone insomnia  . Buprenorphine Hcl-Naloxone Hcl   . Duloxetine Other (See Comments)    Unknown Unknown  . Ketorolac Tromethamine   . Lithium     drool   . Tramadol     Other reaction(s): Other (See Comments) Seizures  . Poison Ivy Extract [Poison Ivy Extract] Rash  . Poison Oak Extract [Poison Oak Extract] Rash  . Sumac Rash    DISCHARGE MEDICATIONS:   Allergies as of 11/17/2018      Reactions   Latex Rash, Swelling   Other reaction(s): Unknown Skin turns red   Morphine Other (See Comments), Swelling   Patient states that her reaction is that her skin gets a bit red.   No urticaria or airway difficulty.     Prednisone Other (See Comments)   Other reaction(s): Other (See Comments) Stayed up 30 days when pt. Was on prednisone insomnia   Buprenorphine Hcl-naloxone Hcl    Duloxetine Other (See Comments)   Unknown Unknown   Ketorolac Tromethamine    Lithium    drool   Tramadol    Other reaction(s): Other (See Comments) Seizures   Poison Ivy Extract [poison Ivy Extract] Rash   Poison Oak Extract [poison Oak Extract] Rash   Sumac Rash      Medication List    STOP taking these medications   traZODone 50 MG tablet Commonly known as:  DESYREL     TAKE these medications   Buprenorphine 7.5 MCG/HR Ptwk Place 7.5 mcg onto the skin once a week.   clonazePAM 2 MG tablet Commonly known as:  KLONOPIN Take 2 mg by  mouth 2 (two) times a day.   cyclobenzaprine 10 MG tablet Commonly known as:  FLEXERIL Take 10 mg by mouth 2 (two) times daily.   lamoTRIgine 25 MG tablet Commonly known as:  LAMICTAL Take 50 mg by mouth daily.   levETIRAcetam 750 MG tablet Commonly known as:  KEPPRA Take 2 tablets (1,500 mg total) by mouth 2 (two) times daily. What changed:    medication strength  how much to take   [name] 3-0.02 MG tablet Generic drug:  drospirenone-ethinyl estradiol Take 1 tablet by mouth daily.   Oxycodone HCl 10 MG Tabs Take 10 mg by mouth 3 (three) times daily.   PARoxetine 20 MG tablet Commonly known as:  PAXIL Take 1 tablet (20 mg total) by mouth at bedtime.   promethazine 25 MG tablet Commonly known as:   PHENERGAN Take 1 tablet (25 mg total) by mouth every 8 (eight) hours as needed for nausea or vomiting.   QUEtiapine 300 MG tablet Commonly known as:  SEROQUEL Take 2 tablets (600 mg total) by mouth at bedtime.        DISCHARGE INSTRUCTIONS:    Follow with neurologist in 1 to 2 weeks.  If you experience worsening of your admission symptoms, develop shortness of breath, life threatening emergency, suicidal or homicidal thoughts you must seek medical attention immediately by calling 911 or calling your MD immediately  if symptoms less severe.  You Must read complete instructions/literature along with all the possible adverse reactions/side effects for all the Medicines you take and that have been prescribed to you. Take any new Medicines after you have completely understood and accept all the possible adverse reactions/side effects.   Please note  You were cared for by a hospitalist during your hospital stay. If you have any questions about your discharge medications or the care you received while you were in the hospital after you are discharged, you can call the unit and asked to speak with the hospitalist on call if the hospitalist that took care of you is not available. Once you are discharged, your primary care physician will handle any further medical issues. Please note that NO REFILLS for any discharge medications will be authorized once you are discharged, as it is imperative that you return to your primary care physician (or establish a relationship with a primary care physician if you do not have one) for your aftercare needs so that they can reassess your need for medications and monitor your lab values.    Today   CHIEF COMPLAINT:   Chief Complaint  Patient presents with  . Seizures    HISTORY OF PRESENT ILLNESS:  [name]  is a 38 y.o. female with a known history of depression, seizures, schizoaffective disorder, and history of polysubstance abuse who presented  to the ED with two seizures during the day today.  On my exam, patient is postictal and mildly confused, so history is obtained from ED physician and from the chart.  She apparently missed a couple of doses of Keppra recently.  She denies any chest pain, shortness of breath, abdominal pain, fevers, chills.  In the ED, she was initially tachycardic with heart rates to 120, but her heart rates improved to the 80s with IV fluids.  Labs were significant for potassium 3.2, WBC 15.4.  UA was unremarkable.  UDS was positive for benzos, marijuana, opiates, and TCAs.  Hospitalists were called for admission.   VITAL SIGNS:  Blood pressure 108/73, pulse 82, temperature 97.7 F (36.5  C), temperature source Oral, resp. rate 16, height 5' 0.98" (1.549 m), weight 78.5 kg, SpO2 96 %.  I/O:    Intake/Output Summary (Last 24 hours) at 11/18/2018 1504 Last data filed at 11/17/2018 1850 Gross per 24 hour  Intake 120 ml  Output -  Net 120 ml    PHYSICAL EXAMINATION:  GENERAL:  38 y.o.-year-old patient lying in the bed with no acute distress.  EYES: Pupils equal, round, reactive to light and accommodation. No scleral icterus. Extraocular muscles intact.  HEENT: Head atraumatic, normocephalic. Oropharynx and nasopharynx clear.  NECK:  Supple, no jugular venous distention. No thyroid enlargement, no tenderness.  LUNGS: Normal breath sounds bilaterally, no wheezing, rales,rhonchi or crepitation. No use of accessory muscles of respiration.  CARDIOVASCULAR: S1, S2 normal. No murmurs, rubs, or gallops.  ABDOMEN: Soft, non-tender, non-distended. Bowel sounds present. No organomegaly or mass.  EXTREMITIES: No pedal edema, cyanosis, or clubbing.  NEUROLOGIC: Cranial nerves II through XII are intact. Muscle strength 5/5 in all extremities. Sensation intact. Gait not checked.  PSYCHIATRIC: The patient is alert and oriented x 3.  SKIN: No obvious rash, lesion, or ulcer.   DATA REVIEW:   CBC Recent Labs  Lab  11/17/18 0129  WBC 8.1  HGB 11.6*  HCT 34.8*  PLT 319    Chemistries  Recent Labs  Lab 11/16/18 2029 11/17/18 0129  NA 139 140  K 3.2* 3.2*  CL 102 107  CO2 14* 24  GLUCOSE 197* 110*  BUN 10 12  CREATININE 0.85 0.84  CALCIUM 9.1 8.6*  AST 28  --   ALT 13  --   ALKPHOS 94  --   BILITOT 0.8  --     Cardiac Enzymes No results for input(s): TROPONINI in the last 168 hours.  Microbiology Results  Results for orders placed or performed during the hospital encounter of 11/16/18  SARS Coronavirus 2 (CEPHEID - Performed in [facility] lab), Hosp Order     Status: None   Collection Time: 11/16/18 11:11 PM  Result Value Ref Range Status   SARS Coronavirus 2 NEGATIVE NEGATIVE Final    Comment: (NOTE) If result is NEGATIVE SARS-CoV-2 target nucleic acids are NOT DETECTED. The SARS-CoV-2 RNA is generally detectable in upper and lower  respiratory specimens during the acute phase of infection. The lowest  concentration of SARS-CoV-2 viral copies this assay can detect is 250  copies / mL. A negative result does not preclude SARS-CoV-2 infection  and should not be used as the sole basis for treatment or other  patient management decisions.  A negative result may occur with  improper specimen collection / handling, submission of specimen other  than nasopharyngeal swab, presence of viral mutation(s) within the  areas targeted by this assay, and inadequate number of viral copies  (<250 copies / mL). A negative result must be combined with clinical  observations, patient history, and epidemiological information. If result is POSITIVE SARS-CoV-2 target nucleic acids are DETECTED. The SARS-CoV-2 RNA is generally detectable in upper and lower  respiratory specimens dur ing the acute phase of infection.  Positive  results are indicative of active infection with SARS-CoV-2.  Clinical  correlation with patient history and other diagnostic information is  necessary to determine  patient infection status.  Positive results do  not rule out bacterial infection or co-infection with other viruses. If result is PRESUMPTIVE POSTIVE SARS-CoV-2 nucleic acids MAY BE PRESENT.   A presumptive positive result was obtained on the submitted specimen  and confirmed on repeat testing.  While 2019 novel coronavirus  (SARS-CoV-2) nucleic acids may be present in the submitted sample  additional confirmatory testing may be necessary for epidemiological  and / or clinical management purposes  to differentiate between  SARS-CoV-2 and other Sarbecovirus currently known to infect humans.  If clinically indicated additional testing with an alternate test  methodology [phone]) is advised. The SARS-CoV-2 RNA is generally  detectable in upper and lower respiratory sp ecimens during the acute  phase of infection. The expected result is Negative. Fact Sheet for Patients:  BoilerBrush.com.cy Fact Sheet for Healthcare Providers: https://pope.com/ This test is not yet approved or cleared by the [place] FDA and has been authorized for detection and/or diagnosis of SARS-CoV-2 by FDA under an Emergency Use Authorization (EUA).  This EUA will remain in effect (meaning this test can be used) for the duration of the COVID-19 declaration under Section 564(b)(1) of the Act, 21 U.S.C. section 360bbb-3(b)(1), unless the authorization is terminated or revoked sooner. Performed at [facility], [address] [place], [place] [address]     RADIOLOGY:  Mr Laqueta Jean LK Contrast  Result Date: 11/17/2018 CLINICAL DATA:  Seizures. EXAM: MRI HEAD WITHOUT AND WITH CONTRAST TECHNIQUE: Multiplanar, multiecho pulse sequences of the brain and surrounding structures were obtained without and with intravenous contrast. CONTRAST:  7 mL Gadavist COMPARISON:  Head CT 01/10/2017 FINDINGS: The study is mildly motion degraded despite repeated imaging  attempts. Brain: Dedicated thin section imaging through the temporal lobes demonstrates normal volume and signal of the hippocampi. There is no evidence of heterotopia or cortical dysplasia. There is no evidence of acute infarct, intracranial hemorrhage, mass, midline shift, or extra-axial fluid collection. The ventricles and sulci are normal. The brain is normal in signal. No abnormal enhancement is identified. Vascular: Major intracranial vascular flow voids are preserved. Skull and upper cervical spine: No suspicious calvarial marrow lesion. Diffusely diminished bone marrow T1 signal intensity in the included cervical spine, nonspecific though can be seen with anemia, smoking, and obesity. Sinuses/Orbits: Unremarkable orbits. Mild mucosal thickening and small mucous retention cyst in the right maxillary sinus. Clear mastoid air cells. Other: None. IMPRESSION: Unremarkable appearance of the brain. No acute intracranial abnormality or cause of seizures identified. Electronically Signed   By: Sebastian Ache M.D.   On: 11/17/2018 17:25   Portable Chest 1 View  Result Date: 11/17/2018 CLINICAL DATA:  Leukocytosis EXAM: PORTABLE CHEST 1 VIEW COMPARISON:  04/23/16 FINDINGS: Cardiac shadow is enlarged but accentuated by the portable technique. Poor inspiratory effort is noted. No focal confluent infiltrate is seen. Mild interstitial markings are noted which may represent some bronchitis. Hiatal hernia is noted. IMPRESSION: Mild bronchitic changes. Electronically Signed   By: Alcide Clever M.D.   On: 11/17/2018 01:35    EKG:   Orders placed or performed during the hospital encounter of 11/16/18  . ED EKG  . ED EKG      Management plans discussed with the patient, family and they are in agreement.  CODE STATUS:  Code Status History    Date Active Date Inactive Code Status Order ID Comments User Context   11/17/2018 0027 11/17/2018 2208 Full Code [id number]  Campbell Stall, MD Inpatient   04/24/2016 1436  04/25/2016 1547 Full Code [id number]  Jeanmarie Plant, MD ED   03/24/2016 2227 03/28/2016 1458 Full Code [id number]  Audery Amel, MD Inpatient   03/01/2016 0333 03/02/2016 1335 Full Code [id number]  Ihor Austin, MD Inpatient  TOTAL TIME TAKING CARE OF THIS PATIENT: 35 minutes.    Altamese Dilling M.D on 11/18/2018 at 3:04 PM  Between 7am to 6pm - Pager - [phone]  After 6pm go to [email or web address] - password [email or web address]  [facility]  Office  [phone]  CC: Primary care physician; Estell Harpin, MD   Note: This dictation was prepared with Dragon dictation along with smaller phrase technology. Any transcriptional errors that result from this process are unintentional.

## 2018-11-20 LAB — LEVETIRACETAM LEVEL: Levetiracetam Lvl: <1 ug/mL — ABNORMAL LOW (ref 10.0–40.0)

## 2020-02-01 ENCOUNTER — Emergency Department
Admission: EM | Admit: 2020-02-01 | Discharge: 2020-02-02 | Disposition: A | Discharge status: Home / Self Care | Payer: Medicare Other | Attending: Emergency Medicine | Admitting: Emergency Medicine

## 2020-02-01 ENCOUNTER — Other Ambulatory Visit: Payer: Self-pay

## 2020-02-01 DIAGNOSIS — Z91040 Latex allergy status: Secondary | ICD-10-CM | POA: Diagnosis not present

## 2020-02-01 DIAGNOSIS — Z86718 Personal history of other venous thrombosis and embolism: Secondary | ICD-10-CM | POA: Insufficient documentation

## 2020-02-01 DIAGNOSIS — F17210 Nicotine dependence, cigarettes, uncomplicated: Secondary | ICD-10-CM | POA: Insufficient documentation

## 2020-02-01 DIAGNOSIS — R569 Unspecified convulsions: Secondary | ICD-10-CM | POA: Diagnosis not present

## 2020-02-01 LAB — CBC
HCT: 45.5 % (ref 36.0–46.0)
Hemoglobin: 14.4 g/dL (ref 12.0–15.0)
MCH: 30.3 pg (ref 26.0–34.0)
MCHC: 31.6 g/dL (ref 30.0–36.0)
MCV: 95.8 fL (ref 80.0–100.0)
Platelets: 392 10*3/uL (ref 150–400)
RBC: 4.75 MIL/uL (ref 3.87–5.11)
RDW: 13 % (ref 11.5–15.5)
WBC: 16.3 10*3/uL — ABNORMAL HIGH (ref 4.0–10.5)
nRBC: 0 % (ref 0.0–0.2)

## 2020-02-01 LAB — BASIC METABOLIC PANEL
Anion gap: 25 — ABNORMAL HIGH (ref 5–15)
BUN: 7 mg/dL (ref 6–20)
CO2: 13 mmol/L — ABNORMAL LOW (ref 22–32)
Calcium: 9 mg/dL (ref 8.9–10.3)
Chloride: 104 mmol/L (ref 98–111)
Creatinine, Ser: 0.94 mg/dL (ref 0.44–1.00)
GFR calc Af Amer: >60 mL/min (ref >60)
GFR calc non Af Amer: >60 mL/min (ref >60)
Glucose, Bld: 209 mg/dL — ABNORMAL HIGH (ref 70–99)
Potassium: 3.2 mmol/L — ABNORMAL LOW (ref 3.5–5.1)
Sodium: 142 mmol/L (ref 135–145)

## 2020-02-01 LAB — ETHANOL: Alcohol, Ethyl (B): <10 mg/dL (ref <10)

## 2020-02-01 MED ORDER — LEVETIRACETAM IN NACL 1500 MG/100ML IV SOLN
1500.0000 mg | Freq: Once | INTRAVENOUS | Status: AC
  Administered 2020-02-01: 1500 mg via INTRAVENOUS
  Filled 2020-02-01: qty 100

## 2020-02-01 MED ORDER — POTASSIUM CHLORIDE CRYS ER 20 MEQ PO TBCR
40.0000 meq | EXTENDED_RELEASE_TABLET | Freq: Once | ORAL | Status: DC
  Filled 2020-02-01: qty 2

## 2020-02-01 NOTE — ED Notes (Signed)
Pt c/o pain to back, arm. Requesting oxycodone, states she takes it at home. Will notify dr Derrill Kay

## 2020-02-01 NOTE — ED Notes (Signed)
Pt responsive to verbal stimuli, oriented to person, place and situation. Family at bedside. RR even and unlabored.  Dr Derrill Kay aware.  Seizure pads in place

## 2020-02-01 NOTE — ED Notes (Signed)
Seizure pads placed on stretcher Pt continues to be unresponsive, RR even and unlabored

## 2020-02-01 NOTE — ED Notes (Signed)
Pharmacy contacted to send keppra

## 2020-02-01 NOTE — ED Triage Notes (Signed)
Pt to ED via [facility] from home for chief complaint seizures. EMS reports pt was postictal upon arrival home, EMS reports pt with seizure enroute, 2mg  versed IM given PTA.  Known hx seizures Pt appears postictal upon arrival, unresponsive RR even and unlabored

## 2020-02-01 NOTE — ED Notes (Signed)
Pt responsive to verbal stimuli, still appears postictal and lethargic. Refusing to take potassium tablets at this time, will re attempt with pt is more awake.  Family at bedside Seizure pads in place

## 2020-02-01 NOTE — Discharge Instructions (Addendum)
Please refrain from driving, going up on ladders or roofs, going in pools or putting yourself in a position of danger if you were to have another seizure. Please seek medical attention for any high fevers, chest pain, shortness of breath, change in behavior, persistent vomiting, bloody stool or any other new or concerning symptoms.

## 2020-02-01 NOTE — ED Notes (Signed)
Pt on RA, taking off [place]

## 2020-02-01 NOTE — ED Provider Notes (Signed)
[facility] Emergency Department Provider Note  ____________________________________________   I have reviewed the triage vital signs and the nursing notes.   HISTORY  Chief Complaint Seizures   History limited by and level 5 caveat due to: Post ictal status   HPI [name] is a 39 y.o. female who presents to the emergency department today because of concern for seizure. Patient is unable to give any history. Per report the patient had seizure at home this afternoon. When EMS first got there she was post ictal. Did start to come around during transport however had another seizure. Patient is on antiepileptics however unclear if patient is taking them.     Records reviewed. Per medical record review patient has a history of seizures.  Past Medical History:  Diagnosis Date  . Degeneration of lumbar intervertebral disc   . Depression   . DVT (deep vein thrombosis) in pregnancy   . Schizoaffective disorder (HCC)   . Seizures (HCC)   . Shoulder pain, right     Patient Active Problem List   Diagnosis Date Noted  . Seizures (HCC) 11/16/2018  . Intellectual disability 03/24/2016  . 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 Substance-induced delirium (HCC) 03/02/2016  . Altered mental status 03/01/2016  . Seizure (HCC) 03/01/2016  . Hypokalemia 03/01/2016  . Leukocytosis 03/01/2016  . Shoulder pain, right 10/25/2015  . Opiate abuse, episodic (HCC) 10/22/2015    Past Surgical History:  Procedure Laterality Date  . CESAREAN SECTION     X2    Prior to Admission medications   Medication Sig Start Date End Date Taking? Authorizing Provider  Buprenorphine 7.5 MCG/HR PTWK Place 7.5 mcg onto the skin once a week. 10/29/14   [provider]  clonazePAM (KLONOPIN) 2 MG tablet Take 2 mg by mouth 2 (two) times a day.    [provider]  cyclobenzaprine (FLEXERIL) 10 MG tablet Take 10 mg by mouth 2 (two) times daily.  01/13/17   [provider]  lamoTRIgine (LAMICTAL) 25 MG tablet Take 50 mg by mouth daily. 07/01/15   [provider]  levETIRAcetam (KEPPRA) 750 MG tablet Take 2 tablets (1,500 mg total) by mouth 2 (two) times daily. 11/17/18   Altamese Dilling, MD  [name] 3-0.02 MG tablet Take 1 tablet by mouth daily. 01/16/17   [provider]  Oxycodone HCl 10 MG TABS Take 10 mg by mouth 3 (three) times daily. 01/15/17   [provider]  PARoxetine (PAXIL) 20 MG tablet Take 1 tablet (20 mg total) by mouth at bedtime. 01/22/17   Emily Filbert, MD  promethazine (PHENERGAN) 25 MG tablet Take 1 tablet (25 mg total) by mouth every 8 (eight) hours as needed for nausea or vomiting. Patient not taking: Reported on 01/24/2017 08/09/16   Ovid Curd P, PA-C  QUEtiapine (SEROQUEL) 300 MG tablet Take 2 tablets (600 mg total) by mouth at bedtime. 04/25/16   Clapacs, Jackquline Denmark, MD    Allergies Latex, Morphine, Prednisone, Buprenorphine hcl-naloxone hcl, Duloxetine, Ketorolac tromethamine, Lithium, Tramadol, Poison ivy extract [poison ivy extract], Poison oak extract [poison oak extract], and Sumac  Family History  Problem Relation Age of Onset  . Cancer Mother     Social History Social History   Tobacco Use  . Smoking status: Current Every Day Smoker    Packs/day: 1.50    Types: Cigarettes  . Smokeless tobacco: Never Used  Vaping Use  . Vaping Use: Never used  Substance Use Topics  . Alcohol  use: No  . Drug use: No    Review of Systems Unable to obtain secondary to post ictal status. ____________________________________________   PHYSICAL EXAM:  VITAL SIGNS: ED Triage Vitals  Enc Vitals Group     BP 02/01/20 1742 (!) 142/67     Pulse Rate 02/01/20 1742 (!) 126     Resp 02/01/20 1742 18     Temp 02/01/20 1742 99 F (37.2 C)     Temp Source 02/01/20 1742 Oral     SpO2 02/01/20 1742 98 %     Weight 02/01/20 1738 200 lb (90.7 kg)     Height 02/01/20 1738  5\' 5"  (1.651 m)   Constitutional: Minimally responsive to pain. Eyes: Conjunctivae are normal.  ENT      Head: Normocephalic and atraumatic.      Nose: No congestion/rhinnorhea.      Mouth/Throat: Mucous membranes are moist.      Neck: No stridor. Hematological/Lymphatic/Immunilogical: No cervical lymphadenopathy. Cardiovascular: Normal rate, regular rhythm.    Respiratory: Normal respiratory effort without tachypnea nor retractions.  Gastrointestinal: Soft and non tender. No rebound. No guarding.  Genitourinary: Deferred Musculoskeletal: Normal range of motion in all extremities. No lower extremity edema. Neurologic:  Minimally responsive to pain. Skin:  Skin is warm, dry and intact. No rash noted.  ____________________________________________    LABS (pertinent positives/negatives)  CBC wbc 16.3, hgb 14.4, plt 392 Ethanol <10 BMP na 142, k 3.2, glu 209, cr 0.94 ____________________________________________   EKG  I, , attending physician, personally viewed and interpreted this EKG  EKG Time: Phineas Semen Rate: 124 Rhythm: sinus tachycardia Axis: right axis deviation Intervals: qtc 496 QRS: narrow ST changes: no st elevation Impression: abnormal ekg ____________________________________________    RADIOLOGY  None  ____________________________________________   PROCEDURES  Procedures  ____________________________________________   INITIAL IMPRESSION / ASSESSMENT AND PLAN / ED COURSE  Pertinent labs & imaging results that were available during my care of the patient were reviewed by me and considered in my medical decision making (see chart for details).   Patient with history of seizures who presents to the emergency department today after having seizures at home. Did receive benzo just prior to my evaluation. Per evaluation of the patient's medication bottles brought by EMS patient is not compliant with medication. She was given IV keppra here.  No  further seizure activity while here in the ER. On repeat exams patient was somnolent but awoken easily to verbal stimuli. I do have some concern given history that the patient might also be suffering from effects of illegal substances. Given that patient has continued to improve here in the emergency department will continue to observe for further alertness. Did discuss with patient importance of medication compliance.   ____________________________________________   FINAL CLINICAL IMPRESSION(S) / ED DIAGNOSES  Final diagnoses:  Seizure (HCC)     Note: This dictation was prepared with Dragon dictation. Any transcriptional errors that result from this process are unintentional     [id number], MD 02/01/20 [phone]

## 2020-02-02 MED ORDER — ACETAMINOPHEN 500 MG PO TABS
ORAL_TABLET | ORAL | Status: AC
  Filled 2020-02-02: qty 2

## 2020-02-02 MED ORDER — ACETAMINOPHEN 500 MG PO TABS
1000.0000 mg | ORAL_TABLET | Freq: Once | ORAL | Status: AC | PRN
  Administered 2020-02-02: 1000 mg via ORAL

## 2020-02-02 NOTE — ED Provider Notes (Signed)
-----------------------------------------   6:38 AM on 02/02/2020 -----------------------------------------  Patient could not find a ride home and remained in the ED overnight asleep.  Will be discharged in the morning when she can secure a ride.   Irean Hong, MD 02/02/20 [phone]

## 2020-02-02 NOTE — ED Notes (Signed)
NAD noted at time of D/C. Pt offered wheelchair upon discharge. Pt did not answer this RN when asked if she wanted one, pt stated she needed to use the bathroom then asked people to leave the room. Pt visualized ambulatory around the room without difficulty. Pt SO visualized pushing patient out in wheelchair after discharge. D/C instructions reviewed with patient and SO, medications that were at bedside given back to patient at this time. Pt and SO deny comments/concerns regarding D/C instructions.

## 2020-10-03 ENCOUNTER — Ambulatory Visit
Admission: EM | Admit: 2020-10-03 | Discharge: 2020-10-03 | Disposition: A | Discharge status: Home / Self Care | Payer: Medicare Other | Attending: Emergency Medicine | Admitting: Emergency Medicine

## 2020-10-03 ENCOUNTER — Other Ambulatory Visit: Payer: Self-pay

## 2020-10-03 ENCOUNTER — Encounter: Payer: Self-pay | Admitting: Emergency Medicine

## 2020-10-03 DIAGNOSIS — S025XXA Fracture of tooth (traumatic), initial encounter for closed fracture: Secondary | ICD-10-CM | POA: Diagnosis not present

## 2020-10-03 MED ORDER — AMOXICILLIN-POT CLAVULANATE 875-125 MG PO TABS: 1.0000 | ORAL_TABLET | Freq: Two times a day (BID) | ORAL | 0 refills | Status: AC

## 2020-10-03 MED ORDER — OXYCODONE HCL 10 MG PO TABS: 10.0000 mg | ORAL_TABLET | Freq: Four times a day (QID) | ORAL | 0 refills | Status: AC

## 2020-10-03 NOTE — ED Triage Notes (Signed)
Patient states that she broke a tooth on Thursday.  Patient states that the tooth is on the right upper side. Patient denies fevers.

## 2020-10-03 NOTE — ED Provider Notes (Addendum)
[facility]    CSN: [id number] Arrival date & time: 10/03/20  0943      History   Chief Complaint Chief Complaint  Patient presents with  . Dental Pain    HPI [name] is a 40 y.o. female.   HPI   40 year old female here for evaluation of abdominal pain.  Patient has a longstanding history of seizures, as well as opiate abuse, and presents today for evaluation of a broken tooth.  Patient reports that she broke her right upper rear molar 2 days ago while having a seizure.  Patient states that it hurts to breathe or chew on that side.  She denies fever or drainage.  Patient has been in contact with a dentist but they were booked up today.  Past Medical History:  Diagnosis Date  . Degeneration of lumbar intervertebral disc   . Depression   . DVT (deep vein thrombosis) in pregnancy   . Schizoaffective disorder (HCC)   . Seizures (HCC)   . Shoulder pain, right     Patient Active Problem List   Diagnosis Date Noted  . Seizures (HCC) 11/16/2018  . Intellectual disability 03/24/2016  . 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 Substance-induced delirium (HCC) 03/02/2016  . Altered mental status 03/01/2016  . Seizure (HCC) 03/01/2016  . Hypokalemia 03/01/2016  . Leukocytosis 03/01/2016  . Shoulder pain, right 10/25/2015  . Opiate abuse, episodic (HCC) 10/22/2015    Past Surgical History:  Procedure Laterality Date  . CESAREAN SECTION     X2    OB History   No obstetric history on file.      Home Medications    Prior to Admission medications   Medication Sig Start Date End Date Taking? Authorizing Provider  amoxicillin-clavulanate (AUGMENTIN) 875-125 MG tablet Take 1 tablet by mouth every 12 (twelve) hours for 10 days. 10/03/20 10/13/20 Yes Becky Augusta, NP  clonazePAM (KLONOPIN) 2 MG tablet Take 2 mg by mouth 2 (two) times a day.   Yes [provider]  levETIRAcetam (KEPPRA) 750 MG tablet Take 2 tablets (1,500 mg total)  by mouth 2 (two) times daily. 11/17/18  Yes Altamese Dilling, MD  Oxycodone HCl 10 MG TABS Take 1 tablet (10 mg total) by mouth in the morning, at noon, in the evening, and at bedtime for 5 days. 10/03/20 10/08/20 Yes Becky Augusta, NP  PARoxetine (PAXIL) 20 MG tablet Take 1 tablet (20 mg total) by mouth at bedtime. 01/22/17  Yes Emily Filbert, MD  QUEtiapine (SEROQUEL) 300 MG tablet Take 2 tablets (600 mg total) by mouth at bedtime. 04/25/16  Yes Clapacs, Jackquline Denmark, MD  Buprenorphine 7.5 MCG/HR PTWK Place 7.5 mcg onto the skin once a week. 10/29/14   [provider]  promethazine (PHENERGAN) 25 MG tablet Take 1 tablet (25 mg total) by mouth every 8 (eight) hours as needed for nausea or vomiting. Patient not taking: Reported on 01/24/2017 08/09/16   Ovid Curd P, PA-C  [name] 3-0.02 MG tablet Take 1 tablet by mouth daily. 01/16/17 10/03/20  [provider]    Family History Family History  Problem Relation Age of Onset  . Cancer Mother     Social History Social History   Tobacco Use  . Smoking status: Current Every Day Smoker    Packs/day: 1.50    Types: Cigarettes  . Smokeless tobacco: Never Used  Vaping Use  . Vaping Use: Never used  Substance Use Topics  . Alcohol use: No  . Drug  use: No     Allergies   Latex, Morphine, Prednisone, Buprenorphine hcl-naloxone hcl, Duloxetine, Ketorolac tromethamine, Lithium, Tramadol, Poison ivy extract [poison ivy extract], Poison oak extract [poison oak extract], and Sumac   Review of Systems Review of Systems  Constitutional: Negative for activity change, appetite change and fever.  HENT: Positive for dental problem. Negative for mouth sores.   Hematological: Negative.   Psychiatric/Behavioral: Negative.      Physical Exam Triage Vital Signs ED Triage Vitals  Enc Vitals Group     BP 10/03/20 0951 122/71     Pulse Rate 10/03/20 0951 96     Resp 10/03/20 0951 14     Temp 10/03/20 0951 98.1 F (36.7 C)      Temp Source 10/03/20 0951 Oral     SpO2 10/03/20 0951 99 %     Weight 10/03/20 0947 160 lb (72.6 kg)     Height 10/03/20 0947 5\' 1"  (1.549 m)     Head Circumference --      Peak Flow --      Pain Score 10/03/20 0947 10     Pain Loc --      Pain Edu? --      Excl. in GC? --    No data found.  Updated Vital Signs BP 122/71 (BP Location: Left Arm)   Pulse 96   Temp 98.1 F (36.7 C) (Oral)   Resp 14   Ht 5\' 1"  (1.549 m)   Wt 160 lb (72.6 kg)   LMP 09/26/2020 (Approximate)   SpO2 99%   BMI 30.23 kg/m   Visual Acuity Right Eye Distance:   Left Eye Distance:   Bilateral Distance:    Right Eye Near:   Left Eye Near:    Bilateral Near:     Physical Exam Vitals and nursing note reviewed.  Constitutional:      General: She is in acute distress.     Appearance: Normal appearance. She is not ill-appearing.  HENT:     Head: Normocephalic and atraumatic.     Mouth/Throat:     Mouth: Mucous membranes are moist.     Pharynx: Oropharynx is clear. No oropharyngeal exudate or posterior oropharyngeal erythema.  Skin:    General: Skin is warm and dry.     Capillary Refill: Capillary refill takes less than 2 seconds.     Findings: No erythema or rash.  Neurological:     General: No focal deficit present.     Mental Status: She is alert.  Psychiatric:        Mood and Affect: Mood normal.        Behavior: Behavior normal.        Thought Content: Thought content normal.        Judgment: Judgment normal.      UC Treatments / Results  Labs (all labs ordered are listed, but only abnormal results are displayed) Labs Reviewed - No data to display  EKG   Radiology No results found.  Procedures Procedures (including critical care time)  Medications Ordered in UC Medications - No data to display  Initial Impression / Assessment and Plan / UC Course  I have reviewed the triage vital signs and the nursing notes.  Pertinent labs & imaging results that were available during  my care of the patient were reviewed by me and considered in my medical decision making (see chart for details).   Patient is a 40 year old female who appears to be in a great  deal of pain here for evaluation of dental pain as result of a broken tooth.  Patient does have a history of substance abuse, substance induced delirium, and opiate abuse.  She also has a history of seizures and states that she had a seizure 2 days ago which resulted in breaking her right upper molar.  Patient does have various and sundry cracked teeth throughout her mouth that are discolored and in disrepair.  Physical exam reveals a fractured right upper rear molar with discoloration to the fracture bed.  No apparent pulp exposure and the surrounding gum tissue is not erythematous or edematous.  No drainage is present on exam.  Patient's exam is consistent with dental fracture.  Will treat with Augmentin 875 twice daily to prevent infection and give patient oxycodone 10 mg for pain control.  Patient advised that I can only give her a limited number and that she needs to follow-up with a dentist as we cannot continue to treat her dental pain.  When discharging the patient and reviewing the discharge instructions the patient offered this provider a Klonopin in exchange for having taking care of her and treating her pain today.  I advised the charge nurse and the nursing staff of this as this is not okay behavior.  Will discuss further with management.   Final Clinical Impressions(s) / UC Diagnoses   Final diagnoses:  Closed fracture of tooth, initial encounter     Discharge Instructions     The Augmentin twice daily for 10 days with food to prevent dental infection.  Use the oxycodone, 10 mg every 6 hours, as needed for severe pain.  Use over-the-counter Tylenol and ibuprofen as needed for mild to moderate pain.  You need to follow-up with a dentist as your tooth needs to be repaired and we cannot continue to take care of  dental pain here in the urgent care as we are not equipped to do so.  If you cannot get into see [facility] you can try night and day dental.  They have a location in [place] at [address] .  Their phone number is [phone]    ED Prescriptions    Medication Sig Dispense Auth. Provider   Oxycodone HCl 10 MG TABS Take 1 tablet (10 mg total) by mouth in the morning, at noon, in the evening, and at bedtime for 5 days. 20 tablet Becky Augusta, NP   amoxicillin-clavulanate (AUGMENTIN) 875-125 MG tablet Take 1 tablet by mouth every 12 (twelve) hours for 10 days. 20 tablet Becky Augusta, NP     I have reviewed the PDMP during this encounter.   Becky Augusta, NP 10/03/20 1039    Becky Augusta, NP 10/03/20 1046

## 2020-10-03 NOTE — Discharge Instructions (Signed)
The Augmentin twice daily for 10 days with food to prevent dental infection.  Use the oxycodone, 10 mg every 6 hours, as needed for severe pain.  Use over-the-counter Tylenol and ibuprofen as needed for mild to moderate pain.  You need to follow-up with a dentist as your tooth needs to be repaired and we cannot continue to take care of dental pain here in the urgent care as we are not equipped to do so.  If you cannot get into see [facility] you can try night and day dental.  They have a location in [place] at [address] .  Their phone number is [phone]

## 2020-12-09 ENCOUNTER — Encounter: Payer: Self-pay | Admitting: Emergency Medicine

## 2020-12-09 ENCOUNTER — Other Ambulatory Visit: Payer: Self-pay

## 2020-12-09 ENCOUNTER — Ambulatory Visit
Admission: EM | Admit: 2020-12-09 | Discharge: 2020-12-09 | Disposition: A | Discharge status: Home / Self Care | Payer: Medicare Other | Attending: Physician Assistant | Admitting: Physician Assistant

## 2020-12-09 DIAGNOSIS — M5432 Sciatica, left side: Secondary | ICD-10-CM

## 2020-12-09 DIAGNOSIS — M5442 Lumbago with sciatica, left side: Secondary | ICD-10-CM | POA: Diagnosis not present

## 2020-12-09 MED ORDER — TIZANIDINE HCL 4 MG PO TABS: 4.0000 mg | ORAL_TABLET | Freq: Three times a day (TID) | ORAL | 0 refills | Status: AC | PRN

## 2020-12-09 MED ORDER — DICLOFENAC SODIUM 75 MG PO TBEC: 75.0000 mg | DELAYED_RELEASE_TABLET | Freq: Two times a day (BID) | ORAL | 0 refills | Status: AC

## 2020-12-09 NOTE — ED Triage Notes (Signed)
Pt presents today with c/o of left hip/leg pain x 3 days. She believes she injured it while lifting husband up after a fall.

## 2020-12-09 NOTE — Discharge Instructions (Addendum)
BACK PAIN: Stressed avoiding painful activities . RICE (REST, ICE, COMPRESSION, ELEVATION) guidelines reviewed. May alternate ice and heat. Consider use of muscle rubs, Salonpas patches, etc. Use medications as directed including muscle relaxers if prescribed. Take anti-inflammatory medications as prescribed or OTC NSAIDs/Tylenol.  F/u with PCP in 7-10 days for reexamination, and please feel free to call or return to the urgent care at any time for any questions or concerns you may have and we will be happy to help you!  ° °BACK PAIN RED FLAGS: If the back pain acutely worsens or there are any red flag symptoms such as numbness/tingling, leg weakness, saddle anesthesia, or loss of bowel/bladder control, go immediately to the ER. Follow up with us as scheduled or sooner if the pain does not begin to resolve or if it worsens before the follow up   ° °You may have a condition requiring you to follow up with Orthopedics so please call one of the following office for appointment:  ° °[facility] °[address], [place], [place] [address] °Phone: [phone] ° °[facility] °[address], [place], [place] [address] °Phone: [phone]  °

## 2020-12-09 NOTE — ED Provider Notes (Signed)
[facility]    CSN: [id number] Arrival date & time: 12/09/20  1449      History   Chief Complaint Chief Complaint  Patient presents with   Leg Pain   Hip Pain    left    HPI [name] is a 40 y.o. female presenting for approximately 3-day history of bilateral lower back pain with radiation all the way down to the left ankle.  Patient says pain is constant and severe.  Increased pain with any movement of her back or leg.  Increased pain with bearing weight on the left leg.  Patient says she is taking over-the-counter Tylenol and used icy hot as well as try to rest without any improvement in symptoms and symptoms seem to be getting worse and not better.  Patient says she has had a problem with sciatica about 3 years ago.  She denies any fever, fatigue, abdominal pain/flank pain, dysuria, or frequency urgency, hematuria, loss of bowel or bladder control or saddle anesthesia.  Patient's past medical history significant for seizures, schizoaffective disorder, opiate abuse currently taking a Sublocade, depression.  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      Home Medications    Prior to Admission medications   Medication Sig Start Date  End Date Taking? Authorizing Provider  clonazePAM (KLONOPIN) 2 MG tablet Take 2 mg by mouth 2 (two) times a day.   Yes [provider]  diclofenac (VOLTAREN) 75 MG EC tablet Take 1 tablet (75 mg total) by mouth 2 (two) times daily for 15 days. 12/09/20 12/24/20 Yes Shirlee Latch, PA-C  levETIRAcetam (KEPPRA) 750 MG tablet Take 2 tablets (1,500 mg total) by mouth 2 (two) times daily. 11/17/18  Yes Altamese Dilling, MD  PARoxetine (PAXIL) 20 MG tablet Take 1 tablet (20 mg total) by mouth at bedtime. 01/22/17  Yes Emily Filbert, MD  QUEtiapine (SEROQUEL) 300 MG tablet Take 2 tablets (600 mg total) by mouth at bedtime. 04/25/16  Yes Clapacs, Jackquline Denmark, MD  tiZANidine (ZANAFLEX) 4 MG tablet Take 1 tablet (4 mg total) by mouth every 8 (eight) hours as needed for up to 10 days. 12/09/20 12/19/20 Yes Shirlee Latch, PA-C  Buprenorphine 7.5 MCG/HR PTWK Place 7.5 mcg onto the skin once a week. 10/29/14   [provider]  promethazine (PHENERGAN) 25 MG tablet Take 1 tablet (25 mg total) by mouth every 8 (eight) hours as needed for nausea or vomiting. Patient not taking: Reported on 01/24/2017 08/09/16   Ovid Curd P, PA-C  [name] 3-0.02 MG tablet Take 1 tablet by mouth daily. 01/16/17 10/03/20  [provider]    Family History Family History  Problem Relation Age of  Onset   Cancer Mother     Social History Social History   Tobacco Use   Smoking status: Every Day    Packs/day: 1.50    Pack years: 0.00    Types: Cigarettes   Smokeless tobacco: Never  Vaping Use   Vaping Use: Never used  Substance Use Topics   Alcohol use: No   Drug use: No     Allergies   Latex, Morphine, Prednisone, Buprenorphine hcl-naloxone hcl, Duloxetine, Ketorolac tromethamine, Lithium, Tramadol, Poison ivy extract [poison ivy extract], Poison oak extract [poison oak extract], and Sumac   Review of Systems Review of Systems  Constitutional:  Negative for fatigue and fever.   Gastrointestinal:  Negative for abdominal pain, nausea and vomiting.  Genitourinary:  Negative for dysuria, flank pain, frequency and hematuria.  Musculoskeletal:  Positive for back pain. Negative for gait problem and joint swelling.  Skin:  Negative for color change.  Neurological:  Negative for weakness and numbness.    Physical Exam Triage Vital Signs ED Triage Vitals  Enc Vitals Group     BP 12/09/20 1503 115/75     Pulse Rate 12/09/20 1503 (!) 109     Resp 12/09/20 1503 20     Temp 12/09/20 1503 98.5 F (36.9 C)     Temp Source 12/09/20 1503 Oral     SpO2 12/09/20 1503 98 %     Weight --      Height --      Head Circumference --      Peak Flow --      Pain Score 12/09/20 1459 10     Pain Loc --      Pain Edu? --      Excl. in GC? --    No data found.  Updated Vital Signs BP 115/75 (BP Location: Left Arm)   Pulse (!) 109   Temp 98.5 F (36.9 C) (Oral)   Resp 20   SpO2 98%       Physical Exam Vitals and nursing note reviewed.  Constitutional:      General: She is not in acute distress.    Appearance: Normal appearance. She is not ill-appearing or toxic-appearing.     Comments: Patient appears uncomfortable in exam room, does appear to be in some level of pain  HENT:     Head: Normocephalic and atraumatic.  Eyes:     General: No scleral icterus.       Right eye: No discharge.        Left eye: No discharge.     Conjunctiva/sclera: Conjunctivae normal.  Cardiovascular:     Rate and Rhythm: Regular rhythm. Tachycardia present.     Heart sounds: Normal heart sounds.  Pulmonary:     Effort: Pulmonary effort is normal. No respiratory distress.     Breath sounds: Normal breath sounds.  Abdominal:     Palpations: Abdomen is soft.     Tenderness: There is no right CVA tenderness or left CVA tenderness.  Musculoskeletal:     Cervical back: Neck supple.     Lumbar back: Tenderness (diffuse TTP throughout lumbar region, worse of the left paralumbar muscles)  present. Decreased range of motion (decreased in all directions due to pain/guarding). Positive left straight leg raise test. Negative right straight leg raise test.  Skin:    General: Skin is dry.  Neurological:     General: No focal deficit present.     Mental Status: She is alert. Mental status is at baseline.  Motor: No weakness.     Gait: Gait normal.  Psychiatric:        Mood and Affect: Mood is anxious.        Behavior: Behavior is cooperative.     UC Treatments / Results  Labs (all labs ordered are listed, but only abnormal results are displayed) Labs Reviewed - No data to display  EKG   Radiology No results found.  Procedures Procedures (including critical care time)  Medications Ordered in UC Medications - No data to display  Initial Impression / Assessment and Plan / UC Course  I have reviewed the triage vital signs and the nursing notes.  Pertinent labs & imaging results that were available during my care of the patient were reviewed by me and considered in my medical decision making (see chart for details).  40 year old female presenting for 2-day history of left leg pain and lower back pain.  Patient says she has had sciatica in the past and symptoms are consistent with that.  Patient says she is in severe pain and needs something for pain.  States the Tylenol she is taking and IcyHot have not helped.  Patient's exam is consistent with lower back pain and left-sided sciatica.  No red flag signs or symptoms elicited on history for severe back condition needing evaluation in ED at this time.  Advised patient that these conditions are treated with anti-inflammatory medication, muscle relaxers, stretches, ice and heat and they generally get better within a couple of weeks.  I did thoroughly review ED red flag signs and symptoms related to back pain with patient.  Advised when to go to ED.  Advised patient this time that we can treat with diclofenac and tizanidine.   She requests "pain pills."  Advised patient that I do not feel comfortable prescribing her narcotic pain medication given her history and the fact that she is taking Sublocade.  Patient requesting oxycodone.  Advised patient once again that I am not going to prescribe her any narcotic pain medication given her history of abuse.  I have sent in diclofenac and tizanidine.  Advised supportive care.  Advised to go to [facility] or ED for any worsening symptoms.  Patient expresses frustration with not being able to have pain medication.  She has ketorolac listed as an allergy so I am unable to give that to her here.  Reaction is unclear but she has been able to take other NSAIDs.   Final Clinical Impressions(s) / UC Diagnoses   Final diagnoses:  Sciatica of left side  Acute bilateral low back pain with left-sided sciatica     Discharge Instructions      BACK PAIN: Stressed avoiding painful activities . RICE (REST, ICE, COMPRESSION, ELEVATION) guidelines reviewed. May alternate ice and heat. Consider use of muscle rubs, Salonpas patches, etc. Use medications as directed including muscle relaxers if prescribed. Take anti-inflammatory medications as prescribed or OTC NSAIDs/Tylenol.  F/u with PCP in 7-10 days for reexamination, and please feel free to call or return to the urgent care at any time for any questions or concerns you may have and we will be happy to help you!   BACK PAIN RED FLAGS: If the back pain acutely worsens or there are any red flag symptoms such as numbness/tingling, leg weakness, saddle anesthesia, or loss of bowel/bladder control, go immediately to the ER. Follow up with [place] as scheduled or sooner if the pain does not begin to resolve or if it worsens before the follow up  You may have a condition requiring you to follow up with Orthopedics so please call one of the following office for appointment:   [facility] [address], [place], [place] [address] Phone: [phone]  [phone]  [facility] [address], [place], [place] [address] Phone: [phone]      ED Prescriptions     Medication Sig Dispense Auth. Provider   diclofenac (VOLTAREN) 75 MG EC tablet Take 1 tablet (75 mg total) by mouth 2 (two) times daily for 15 days. 30 tablet Eusebio FriendlyEaves, Shawan Tosh B, PA-C   tiZANidine (ZANAFLEX) 4 MG tablet Take 1 tablet (4 mg total) by mouth every 8 (eight) hours as needed for up to 10 days. 20 tablet Shirlee LatchEaves, Adilynn Bessey B, PA-C      I have reviewed the PDMP during this encounter.   Shirlee Latchaves, Ramses Klecka B, PA-C 12/09/20 1629

## 2021-10-02 ENCOUNTER — Emergency Department: Payer: Medicare Other

## 2021-10-02 ENCOUNTER — Other Ambulatory Visit: Payer: Self-pay

## 2021-10-02 ENCOUNTER — Observation Stay
Admission: EM | Admit: 2021-10-02 | Discharge: 2021-10-03 | Disposition: A | Discharge status: Home / Self Care | Payer: Medicare Other | Attending: Internal Medicine | Admitting: Internal Medicine

## 2021-10-02 DIAGNOSIS — F17210 Nicotine dependence, cigarettes, uncomplicated: Secondary | ICD-10-CM | POA: Insufficient documentation

## 2021-10-02 DIAGNOSIS — F32A Depression, unspecified: Secondary | ICD-10-CM | POA: Insufficient documentation

## 2021-10-02 DIAGNOSIS — F79 Unspecified intellectual disabilities: Secondary | ICD-10-CM | POA: Insufficient documentation

## 2021-10-02 DIAGNOSIS — R4701 Aphasia: Secondary | ICD-10-CM | POA: Diagnosis present

## 2021-10-02 DIAGNOSIS — F203 Undifferentiated schizophrenia: Secondary | ICD-10-CM | POA: Insufficient documentation

## 2021-10-02 DIAGNOSIS — Z79899 Other long term (current) drug therapy: Secondary | ICD-10-CM | POA: Diagnosis not present

## 2021-10-02 DIAGNOSIS — F319 Bipolar disorder, unspecified: Secondary | ICD-10-CM

## 2021-10-02 DIAGNOSIS — G4089 Other seizures: Secondary | ICD-10-CM

## 2021-10-02 DIAGNOSIS — F1110 Opioid abuse, uncomplicated: Secondary | ICD-10-CM | POA: Insufficient documentation

## 2021-10-02 DIAGNOSIS — Z91040 Latex allergy status: Secondary | ICD-10-CM | POA: Diagnosis not present

## 2021-10-02 DIAGNOSIS — R569 Unspecified convulsions: Secondary | ICD-10-CM

## 2021-10-02 DIAGNOSIS — G40909 Epilepsy, unspecified, not intractable, without status epilepticus: Principal | ICD-10-CM | POA: Insufficient documentation

## 2021-10-02 DIAGNOSIS — R4182 Altered mental status, unspecified: Secondary | ICD-10-CM | POA: Insufficient documentation

## 2021-10-02 DIAGNOSIS — D72829 Elevated white blood cell count, unspecified: Secondary | ICD-10-CM | POA: Diagnosis not present

## 2021-10-02 LAB — URINALYSIS, ROUTINE W REFLEX MICROSCOPIC
Bilirubin Urine: NEGATIVE
Glucose, UA: NEGATIVE mg/dL
Hgb urine dipstick: NEGATIVE
Ketones, ur: NEGATIVE mg/dL
Leukocytes,Ua: NEGATIVE
Nitrite: NEGATIVE
Protein, ur: NEGATIVE mg/dL
Specific Gravity, Urine: 1.015 (ref 1.005–1.030)
pH: 5 (ref 5.0–8.0)

## 2021-10-02 LAB — COMPREHENSIVE METABOLIC PANEL
ALT: 16 U/L (ref 0–44)
AST: 37 U/L (ref 15–41)
Albumin: 3.6 g/dL (ref 3.5–5.0)
Alkaline Phosphatase: 68 U/L (ref 38–126)
Anion gap: 15 (ref 5–15)
BUN: 9 mg/dL (ref 6–20)
CO2: 16 mmol/L — ABNORMAL LOW (ref 22–32)
Calcium: 8.9 mg/dL (ref 8.9–10.3)
Chloride: 107 mmol/L (ref 98–111)
Creatinine, Ser: 1.02 mg/dL — ABNORMAL HIGH (ref 0.44–1.00)
GFR, Estimated: >60 mL/min (ref >60)
Glucose, Bld: 185 mg/dL — ABNORMAL HIGH (ref 70–99)
Potassium: 3.6 mmol/L (ref 3.5–5.1)
Sodium: 138 mmol/L (ref 135–145)
Total Bilirubin: 0.5 mg/dL (ref 0.3–1.2)
Total Protein: 7.4 g/dL (ref 6.5–8.1)

## 2021-10-02 LAB — CBC WITH DIFFERENTIAL/PLATELET
Abs Immature Granulocytes: 0.11 10*3/uL — ABNORMAL HIGH (ref 0.00–0.07)
Basophils Absolute: 0.1 10*3/uL (ref 0.0–0.1)
Basophils Relative: 0 %
Eosinophils Absolute: 0.1 10*3/uL (ref 0.0–0.5)
Eosinophils Relative: 0 %
HCT: 42.2 % (ref 36.0–46.0)
Hemoglobin: 13.9 g/dL (ref 12.0–15.0)
Immature Granulocytes: 1 %
Lymphocytes Relative: 11 %
Lymphs Abs: 1.8 10*3/uL (ref 0.7–4.0)
MCH: 31.3 pg (ref 26.0–34.0)
MCHC: 32.9 g/dL (ref 30.0–36.0)
MCV: 95 fL (ref 80.0–100.0)
Monocytes Absolute: 0.6 10*3/uL (ref 0.1–1.0)
Monocytes Relative: 4 %
Neutro Abs: 12.9 10*3/uL — ABNORMAL HIGH (ref 1.7–7.7)
Neutrophils Relative %: 84 %
Platelets: 279 10*3/uL (ref 150–400)
RBC: 4.44 MIL/uL (ref 3.87–5.11)
RDW: 13.2 % (ref 11.5–15.5)
WBC: 15.4 10*3/uL — ABNORMAL HIGH (ref 4.0–10.5)
nRBC: 0 % (ref 0.0–0.2)

## 2021-10-02 LAB — URINE DRUG SCREEN, QUALITATIVE (ARMC ONLY)
Amphetamines, Ur Screen: NOT DETECTED
Barbiturates, Ur Screen: NOT DETECTED
Benzodiazepine, Ur Scrn: POSITIVE — AB
Cannabinoid 50 Ng, Ur ~~LOC~~: POSITIVE — AB
Cocaine Metabolite,Ur ~~LOC~~: NOT DETECTED
MDMA (Ecstasy)Ur Screen: NOT DETECTED
Methadone Scn, Ur: NOT DETECTED
Opiate, Ur Screen: NOT DETECTED
Phencyclidine (PCP) Ur S: NOT DETECTED
Tricyclic, Ur Screen: POSITIVE — AB

## 2021-10-02 LAB — PREGNANCY, URINE: Preg Test, Ur: NEGATIVE

## 2021-10-02 MED ORDER — METRONIDAZOLE 500 MG/100ML IV SOLN
500.0000 mg | Freq: Two times a day (BID) | INTRAVENOUS | Status: DC
  Administered 2021-10-03 (×2): 500 mg via INTRAVENOUS
  Filled 2021-10-02 (×2): qty 100

## 2021-10-02 MED ORDER — LEVETIRACETAM 500 MG PO TABS: 1000.0000 mg | ORAL_TABLET | Freq: Two times a day (BID) | ORAL | Status: DC

## 2021-10-02 MED ORDER — ONDANSETRON HCL 4 MG/2ML IJ SOLN: 4.0000 mg | Freq: Four times a day (QID) | INTRAMUSCULAR | Status: DC | PRN

## 2021-10-02 MED ORDER — VANCOMYCIN HCL 1500 MG/300ML IV SOLN
1500.0000 mg | Freq: Once | INTRAVENOUS | Status: AC
  Administered 2021-10-03: 1500 mg via INTRAVENOUS
  Filled 2021-10-02 (×2): qty 300

## 2021-10-02 MED ORDER — ACETAMINOPHEN 325 MG PO TABS
650.0000 mg | ORAL_TABLET | Freq: Four times a day (QID) | ORAL | Status: DC | PRN
Start: 2021-10-02 — End: 2021-10-03

## 2021-10-02 MED ORDER — LEVETIRACETAM IN NACL 1000 MG/100ML IV SOLN
1000.0000 mg | Freq: Once | INTRAVENOUS | Status: AC
  Administered 2021-10-02: 1000 mg via INTRAVENOUS
  Filled 2021-10-02: qty 100

## 2021-10-02 MED ORDER — ENOXAPARIN SODIUM 40 MG/0.4ML IJ SOSY
40.0000 mg | PREFILLED_SYRINGE | INTRAMUSCULAR | Status: DC
  Administered 2021-10-03: 40 mg via SUBCUTANEOUS
  Filled 2021-10-02: qty 0.4

## 2021-10-02 MED ORDER — SODIUM CHLORIDE 0.9 % IV BOLUS (SEPSIS)
1000.0000 mL | Freq: Once | INTRAVENOUS | Status: AC
  Administered 2021-10-03: 1000 mL via INTRAVENOUS

## 2021-10-02 MED ORDER — SODIUM CHLORIDE 0.9 % IV BOLUS
500.0000 mL | Freq: Once | INTRAVENOUS | Status: AC
  Administered 2021-10-02: 500 mL via INTRAVENOUS

## 2021-10-02 MED ORDER — VANCOMYCIN HCL 1250 MG/250ML IV SOLN: 1250.0000 mg | INTRAVENOUS | Status: DC

## 2021-10-02 MED ORDER — POLYETHYLENE GLYCOL 3350 17 G PO PACK: 17.0000 g | PACK | Freq: Every day | ORAL | Status: DC | PRN

## 2021-10-02 MED ORDER — PAROXETINE HCL 20 MG PO TABS
20.0000 mg | ORAL_TABLET | Freq: Every day | ORAL | Status: DC
  Filled 2021-10-02 (×2): qty 1

## 2021-10-02 MED ORDER — ONDANSETRON HCL 4 MG PO TABS
4.0000 mg | ORAL_TABLET | Freq: Four times a day (QID) | ORAL | Status: DC | PRN
Start: 2021-10-02 — End: 2021-10-03

## 2021-10-02 MED ORDER — LORAZEPAM 2 MG/ML IJ SOLN: 2.0000 mg | INTRAMUSCULAR | Status: DC | PRN

## 2021-10-02 MED ORDER — ACETAMINOPHEN 650 MG RE SUPP: 650.0000 mg | Freq: Four times a day (QID) | RECTAL | Status: DC | PRN

## 2021-10-02 MED ORDER — SODIUM CHLORIDE 0.9 % IV SOLN
2.0000 g | Freq: Three times a day (TID) | INTRAVENOUS | Status: DC
  Administered 2021-10-03 (×2): 2 g via INTRAVENOUS
  Filled 2021-10-02 (×3): qty 12.5

## 2021-10-02 MED ORDER — QUETIAPINE FUMARATE 200 MG PO TABS
400.0000 mg | ORAL_TABLET | Freq: Every day | ORAL | Status: DC
  Filled 2021-10-02: qty 2

## 2021-10-02 MED ORDER — CLONAZEPAM 1 MG PO TABS
1.0000 mg | ORAL_TABLET | Freq: Three times a day (TID) | ORAL | Status: DC
  Administered 2021-10-03 (×2): 1 mg via ORAL
  Filled 2021-10-02 (×2): qty 1

## 2021-10-02 NOTE — ED Notes (Signed)
Pt sitting up with family member at bedside. Pt unable to verbalize location. Pt will say name but unable to answer questions appropriately.  ?

## 2021-10-02 NOTE — Assessment & Plan Note (Addendum)
Patient was monitored overnight, she woke up, passed bedside swallow evaluation by nurse.  She is starting to say a few words, no confusion.  Per patient father, she has back to herself.  At this point, she will be discharged to home.  Anticipating started talking towards 3 days per her prior history.  Family is advised to bring her to the family doctor if she still cannot talk after 3 days. ?

## 2021-10-02 NOTE — ED Notes (Signed)
Pt confused and walking into the hallway without clothes. Nursing staff attempting to orient patient but unsuccessful. Nursing staff eventually able to guide pt back into bed  ?

## 2021-10-02 NOTE — H&P (Signed)
?History and Physical  ? ?[name] MWU:[id number] DOB: 1980/12/30 DOA: 10/02/2021 ? ?PCP: Estell Harpin, MD  ?Outpatient Specialists: Dr. Stevphen Rochester, gastroenterology ?Patient coming from: home via EMS ? ?I have personally briefly reviewed patient's old medical records in [facility] EMR. ? ?Chief Concern: Seizure ? ?HPI: [name] is a 41 year old female with history of seizure, bipolar, intellectual disability, history of dental caries, catatonia, who presents emergency department for chief concerns of seizures. ? ?Vital signs in the emergency department show temperature of 98.4, respiration rate of 15, heart rate 125, blood pressure 115/74, SPO2 of 96% on 2 L nasal cannula. ? ?Serum sodium 138, potassium 3.6, chloride 107, bicarb 16, BUN of 9, serum creatinine of 1.02, GFR greater than 60, nonfasting blood glucose 185, WBC elevated at 15.4, hemoglobin 13.9, platelets of 279. ? ?UA was negative for nitrates and leukocytes. ? ?UDS is positive for THC, benzos, TCA ? ?Urine pregnancy was negative. ? ?Per ED triage nursing report: Patient had 2 witnessed seizures at home and 1 seizure with EMS that lasted approximately 3 minutes and 50 seconds.  Patient received 2 mg IM Versed via EMS. ? ?ED treatment: Keppra 1 g IV one-time dose, sodium chloride 500 mL bolus. ? ?At bedside patient appears calm, nonverbal, sitting in hospital bed, moving back and forth.  She does not appear to be agitated at this time however nursing reports that initially she was agitated and attempting to get out of bed, attempting to pull things away from her. ? ?She has spontaneous eye movements, no facial droop, occasionally tracks me.  Pupils bilateral equal and reactive to light and accommodation. ? ?Her boyfriend at bedside is unsure if patient is compliant with her medication.  He reports that he does not believe she is currently taking her Suboxone medication for several days or weeks now. ? ?He does state that at baseline she  talks to him, knows her name, and is conversant. ? ?Social history: She lives at home with her boyfriend.  They have a 41 year old living with them. ? ?Vaccination history: Unknown ? ?ROS: Unable to complete as patient is nonverbal ? ?ED Course: Discussed with emergency medicine provider, patient requiring hospitalization for chief concerns of seizure. ? ?Assessment/Plan ? ?Principal Problem: ?  Seizure (HCC) ?Active Problems: ?  Opiate abuse, episodic (HCC) ?  Altered mental status ?  Leukocytosis ?  Intellectual disability ?  Undifferentiated schizophrenia (HCC) ?  Depression ?  Nonverbal ?  ?Assessment and Plan: ? ?* Seizure (HCC) ?- Status post Keppra 1 g IV per EDP ?- Patient is currently postictal ?- Etiology is multifactorial including possible medication noncompliance and/or withdrawal as per med reconciliation, patient has not taken her clonazepam regularly ?- Resumed home Keppra 1000 mg p.o. twice daily, home clonazepam 1 mg p.o. 3 times daily ?- Routine EEG ?- Fall and seizure precautions ? ?Nonverbal ?- Query postictal versus catatonia ?- EEG ordered ?- Would recommend that if patient's remains nonverbal, psychiatric involvement may be beneficial ? ?Depression ?- Resumed home paroxetine 20 mg nightly ? ?Undifferentiated schizophrenia (HCC) ?With diagnosis of bipolar disorder ?- Resumed home quetiapine 400 mg nightly ? ?Leukocytosis ?- Check lactic acid, blood cultures x2 ordered, check procalcitonin ?- Ordered sodium chloride 1 L bolus ?- Broad-spectrum antibiotic with cefepime and vancomycin per pharmacy ?- Admit to progressive cardiac, observation ? ?Altered mental status ?- Etiology work-up in progress, differentials include multifactorial including seizure ?- Treat as above ? ?Personal staff patient can be agitated, safety one-to-one telemetry  observation ? ?Chart reviewed.  ? ?DVT prophylaxis: Enoxaparin ?Code Status: Full code ?Diet: N.p.o. ?Family Communication:  ?Disposition Plan: Pending  clinical course ?Consults called: none at this time ?Admission status: Progressive cardiac, observation ? ?Past Medical History:  ?Diagnosis Date  ? Degeneration of lumbar intervertebral disc   ? Depression   ? DVT (deep vein thrombosis) in pregnancy   ? Schizoaffective disorder (HCC)   ? Seizures (HCC)   ? Shoulder pain, right   ? ?Past Surgical History:  ?Procedure Laterality Date  ? CESAREAN SECTION    ? X2  ? ?Social History:  reports that she has been smoking cigarettes. She has been smoking an average of 1.5 packs per day. She has never used smokeless tobacco. She reports that she does not drink alcohol and does not use drugs. ? ?Allergies  ?Allergen Reactions  ? Latex Rash and Swelling  ?  Other reaction(s): Unknown ?Skin turns red  ? Morphine Other (See Comments) and Swelling  ?  Patient states that her reaction is that her skin gets a bit red.   No urticaria or airway difficulty.    ? Prednisone Other (See Comments)  ?  Other reaction(s): Other (See Comments) ?Stayed up 30 days when pt. Was on prednisone ?insomnia  ? Buprenorphine Hcl-Naloxone Hcl   ? Duloxetine Other (See Comments)  ?  Unknown ?Unknown  ? Ketorolac Tromethamine   ? Lithium   ?  drool  ? Tramadol   ?  Other reaction(s): Other (See Comments) ?Seizures  ? Poison Ivy Extract [Poison Ivy Extract] Rash  ? Poison Oak Extract [Poison Oak Extract] Rash  ? Sumac Rash  ? ?Family History  ?Problem Relation Age of Onset  ? Cancer Mother   ? ?Family history: Family history reviewed and not pertinent ? ?Prior to Admission medications   ?Medication Sig Start Date End Date Taking? Authorizing Provider  ?Buprenorphine HCl-Naloxone HCl 8-2 MG FILM Place 0.5 strips under the tongue 2 (two) times daily. 09/27/21  Yes [provider]  ?clonazePAM (KLONOPIN) 1 MG tablet Take 1 mg by mouth 3 (three) times daily. 09/06/21  Yes [provider]  ?QUEtiapine (SEROQUEL) 400 MG tablet Take 400-800 mg by mouth at bedtime. 09/29/21  Yes [provider]  ?diclofenac (VOLTAREN) 75 MG EC tablet Take 75 mg by mouth 2 (two) times daily as needed. ?Patient not taking: Reported on 10/02/2021 09/21/21   [provider]  ?levETIRAcetam (KEPPRA) 1000 MG tablet Take 1,000 mg by mouth 2 (two) times daily.    [provider]  ?Oxycodone HCl 10 MG TABS Take 10 mg by mouth 2 (two) times daily. ?Patient not taking: Reported on 10/02/2021 09/13/21   [provider]  ?PARoxetine (PAXIL) 20 MG tablet Take 1 tablet (20 mg total) by mouth at bedtime. 01/22/17   [name], Jonathan E, MD  ?Lowella BandyNIKKI 3-0.02 MG tablet Take 1 tablet by mouth daily. 01/16/17 10/03/20  [provider]  ? ?Physical Exam: ?Vitals:  ? 10/02/21 1845 10/02/21 2029 10/02/21 2128 10/02/21 2338  ?BP: 133/84 130/81 125/86 125/79  ?Pulse: 96 84 83 69  ?Resp: 16 20 20 17   ?Temp:    97.7 ?F (36.5 ?C)  ?TempSrc:    Oral  ?SpO2: 94% 95% 95% (!) 86%  ?Weight:      ?Height:      ? ?Constitutional: appears older than chronological age, NAD, calm, comfortable ?Eyes: PERRL, lids and conjunctivae normal ?ENMT: Mucous membranes are moist.  Patient would not open her  mouth, unable to access posterior pharynx.  Unable to assess hearing ?Neck: normal, supple, no masses, no thyromegaly ?Respiratory: clear to auscultation bilaterally, no wheezing, no crackles. Normal respiratory effort. No accessory muscle use.  ?Cardiovascular: Regular rate and rhythm, no murmurs / rubs / gallops. No extremity edema. 2+ pedal pulses. No carotid bruits.  ?Abdomen: Obese abdomen, no tenderness, no masses palpated, no hepatosplenomegaly. Bowel sounds positive.  ?Musculoskeletal: no clubbing / cyanosis. No joint deformity upper and lower extremities. Good ROM, no contractures, no atrophy. Normal muscle tone.  ?Skin: no rashes, lesions, ulcers. No induration ?Neurologic: Sensation intact. Strength 5/5 in all 4.  ?Psychiatric: Normal judgment and insight. Alert and oriented x 3. Normal mood.  ? ?EKG: independently reviewed,  showing sinus rhythm with rate of 82, QTc 502 ? ?Chest x-ray on Admission: I personally reviewed and I agree with radiologist reading as below. ? ?CT Head Wo Contrast ? ?Result Date: 10/02/2021 ?CLINICAL DATA:  He

## 2021-10-02 NOTE — Hospital Course (Addendum)
[name] is a 41 year old female with history of seizure, bipolar, intellectual disability, history of dental caries, catatonia, who presents emergency department for chief concerns of seizures. ?Patient had a 3 episodes of seizure which was witnessed the emergency room, then she developed postictal confusion.She was giving IV Keppra and restart Klonopin. ?UA was negative for nitrates and leukocytes. ?UDS is positive for THC, benzos, TCA ?Urine pregnancy was negative. ? ?This morning, patient started waking up, currently she no longer has any confusion.  But still not able to talk.  Met the patient father and husband, father states that the patient has been having seizure for roughly 20 years, on average she has a seizure every 4 months.  After each episode, patient would not be able to talk for 2 or 3 days.  Per father, patient has already back to herself.  At this point, she is medically stable to discharge.  I discussed with neurology, will continue current Keppra and Klonopin.  Patient need to follow-up with neurology and psychiatry, to switch off Klonopin and switch to another antiseizure medicine. ?

## 2021-10-02 NOTE — Assessment & Plan Note (Addendum)
With diagnosis of bipolar disorder ?Continue quetiapine 400 mg nightly ?

## 2021-10-02 NOTE — Progress Notes (Signed)
Pharmacy Antibiotic Note ? ?[name] is a 41 y.o. female admitted on 10/02/2021 with suspected sepsis from unknown source.  Pharmacy has been consulted for Cefepime and Vancomycin dosing x 7 days. ? ?Plan: ?Cefepime 2 gm q8h per indication and renal fxn. ? ?Vancomycin ?Pt ordered initial dose of Vancomycin 1500 mg ?Vancomycin 1250 mg IV Q 24 hrs.  ?Goal AUC 400-550. ?Expected AUC: 466.7 ?SCr used: 1.02 ? ?Pharmacy will continue to follow and will adjust abx dosing whenever warranted. ? ? ?Height: 5\' 1"  (154.9 cm) ?Weight: 72.6 kg (160 lb 0.9 oz) ?IBW/kg (Calculated) : 47.8 ? ?Temp (24hrs), Avg:98.4 ?F (36.9 ?C), Min:98.4 ?F (36.9 ?C), Max:98.4 ?F (36.9 ?C) ? ?Recent Labs  ?Lab 10/02/21 ?1330  ?WBC 15.4*  ?CREATININE 1.02*  ?  ?Estimated Creatinine Clearance: 68.1 mL/min (A) (by C-G formula based on SCr of 1.02 mg/dL (H)).   ? ?Allergies  ?Allergen Reactions  ? Latex Rash and Swelling  ?  Other reaction(s): Unknown ?Skin turns red  ? Morphine Other (See Comments) and Swelling  ?  Patient states that her reaction is that her skin gets a bit red.   No urticaria or airway difficulty.    ? Prednisone Other (See Comments)  ?  Other reaction(s): Other (See Comments) ?Stayed up 30 days when pt. Was on prednisone ?insomnia  ? Buprenorphine Hcl-Naloxone Hcl   ? Duloxetine Other (See Comments)  ?  Unknown ?Unknown  ? Ketorolac Tromethamine   ? Lithium   ?  drool  ? Tramadol   ?  Other reaction(s): Other (See Comments) ?Seizures  ? Poison Ivy Extract [Poison Ivy Extract] Rash  ? Poison Oak Extract [Poison Oak Extract] Rash  ? Sumac Rash  ? ? ?Antimicrobials this admission: ?4/08 Flagyl >> x 7 days ?4/09 Cefepime >> x 7 days ?4/09 Vancomycin >> x 7 days ? ?Microbiology results: ?4/08 BCx: Pending ? ?Thank you for allowing pharmacy to be a part of this patient?s care. ? ?6/08, PharmD, MBA ?10/02/2021 ?11:07 PM ? ? ?

## 2021-10-02 NOTE — Assessment & Plan Note (Addendum)
Resumed home paroxetine 20 mg nightly ?

## 2021-10-02 NOTE — Assessment & Plan Note (Addendum)
Postictal.  Patient has woke up, no longer has any confusion. ?

## 2021-10-02 NOTE — ED Triage Notes (Signed)
Pt had 2 witnessed seizures at home and 1 seizure witnessed by EMS that lasted 50 seconds. Pt was given 2mg  versed IM in route. Pt started to arrouse in route and is sitting up in bed during triage. Pt is postictal. Pt stated she needs to pee. Unable to answer any questions at time of triage.  ?

## 2021-10-02 NOTE — Assessment & Plan Note (Addendum)
This appears to be secondary to seizure.  I do not see any evidence of infection, antibiotics will be discontinued. ?

## 2021-10-03 DIAGNOSIS — R569 Unspecified convulsions: Secondary | ICD-10-CM

## 2021-10-03 DIAGNOSIS — R404 Transient alteration of awareness: Secondary | ICD-10-CM | POA: Diagnosis not present

## 2021-10-03 DIAGNOSIS — R4701 Aphasia: Secondary | ICD-10-CM | POA: Diagnosis present

## 2021-10-03 DIAGNOSIS — G40909 Epilepsy, unspecified, not intractable, without status epilepticus: Secondary | ICD-10-CM | POA: Diagnosis not present

## 2021-10-03 DIAGNOSIS — D72828 Other elevated white blood cell count: Secondary | ICD-10-CM | POA: Diagnosis not present

## 2021-10-03 LAB — CBC WITH DIFFERENTIAL/PLATELET
Abs Immature Granulocytes: 0.02 10*3/uL (ref 0.00–0.07)
Basophils Absolute: 0 10*3/uL (ref 0.0–0.1)
Basophils Relative: 0 %
Eosinophils Absolute: 0 10*3/uL (ref 0.0–0.5)
Eosinophils Relative: 0 %
HCT: 36.7 % (ref 36.0–46.0)
Hemoglobin: 12.4 g/dL (ref 12.0–15.0)
Immature Granulocytes: 0 %
Lymphocytes Relative: 24 %
Lymphs Abs: 2.4 10*3/uL (ref 0.7–4.0)
MCH: 31.4 pg (ref 26.0–34.0)
MCHC: 33.8 g/dL (ref 30.0–36.0)
MCV: 92.9 fL (ref 80.0–100.0)
Monocytes Absolute: 0.7 10*3/uL (ref 0.1–1.0)
Monocytes Relative: 7 %
Neutro Abs: 6.7 10*3/uL (ref 1.7–7.7)
Neutrophils Relative %: 69 %
Platelets: 224 10*3/uL (ref 150–400)
RBC: 3.95 MIL/uL (ref 3.87–5.11)
RDW: 13.2 % (ref 11.5–15.5)
WBC: 9.9 10*3/uL (ref 4.0–10.5)
nRBC: 0 % (ref 0.0–0.2)

## 2021-10-03 LAB — MAGNESIUM: Magnesium: 2.4 mg/dL (ref 1.7–2.4)

## 2021-10-03 LAB — PROTIME-INR
INR: 1 (ref 0.8–1.2)
Prothrombin Time: 13.5 seconds (ref 11.4–15.2)

## 2021-10-03 LAB — TSH: TSH: 0.65 u[IU]/mL (ref 0.350–4.500)

## 2021-10-03 LAB — HIV ANTIBODY (ROUTINE TESTING W REFLEX): HIV Screen 4th Generation wRfx: NONREACTIVE

## 2021-10-03 LAB — BASIC METABOLIC PANEL
Anion gap: 7 (ref 5–15)
BUN: 12 mg/dL (ref 6–20)
CO2: 24 mmol/L (ref 22–32)
Calcium: 8.4 mg/dL — ABNORMAL LOW (ref 8.9–10.3)
Chloride: 109 mmol/L (ref 98–111)
Creatinine, Ser: 1.11 mg/dL — ABNORMAL HIGH (ref 0.44–1.00)
GFR, Estimated: >60 mL/min (ref >60)
Glucose, Bld: 93 mg/dL (ref 70–99)
Potassium: 3.7 mmol/L (ref 3.5–5.1)
Sodium: 140 mmol/L (ref 135–145)

## 2021-10-03 LAB — LACTIC ACID, PLASMA
Lactic Acid, Venous: 1.3 mmol/L (ref 0.5–1.9)
Lactic Acid, Venous: 0.9 mmol/L (ref 0.5–1.9)

## 2021-10-03 LAB — PHOSPHORUS: Phosphorus: 2.8 mg/dL (ref 2.5–4.6)

## 2021-10-03 LAB — CREATININE, SERUM
Creatinine, Ser: 1.17 mg/dL — ABNORMAL HIGH (ref 0.44–1.00)
GFR, Estimated: >60 mL/min (ref >60)

## 2021-10-03 LAB — PROCALCITONIN: Procalcitonin: 0.24 ng/mL

## 2021-10-03 MED ORDER — HALOPERIDOL LACTATE 5 MG/ML IJ SOLN
5.0000 mg | Freq: Four times a day (QID) | INTRAMUSCULAR | Status: AC | PRN
  Administered 2021-10-03: 5 mg via INTRAVENOUS
  Filled 2021-10-03: qty 1

## 2021-10-03 MED ORDER — LEVETIRACETAM 1000 MG PO TABS: 1000.0000 mg | ORAL_TABLET | Freq: Two times a day (BID) | ORAL | 0 refills | Status: DC

## 2021-10-03 MED ORDER — CLONAZEPAM 1 MG PO TABS: 1.0000 mg | ORAL_TABLET | Freq: Three times a day (TID) | ORAL | 0 refills | Status: DC

## 2021-10-03 MED ORDER — LEVETIRACETAM IN NACL 1000 MG/100ML IV SOLN
1000.0000 mg | Freq: Two times a day (BID) | INTRAVENOUS | Status: DC
Start: 2021-10-03 — End: 2021-10-03
  Administered 2021-10-03 (×2): 1000 mg via INTRAVENOUS
  Filled 2021-10-03 (×2): qty 100

## 2021-10-03 MED ORDER — LACTATED RINGERS IV SOLN: INTRAVENOUS | Status: DC

## 2021-10-03 MED ORDER — LORAZEPAM 2 MG/ML IJ SOLN
2.0000 mg | INTRAMUSCULAR | Status: DC | PRN
  Administered 2021-10-03: 2 mg via INTRAVENOUS
  Filled 2021-10-03: qty 1

## 2021-10-03 NOTE — Progress Notes (Signed)
Ativan given for Anxiety ?

## 2021-10-03 NOTE — Progress Notes (Addendum)
Patient has pulled out IV. She states pain. IV consult placed and IV replaced. See flowsheets. Patient is only calm enough for placement of 1 PIV ?

## 2021-10-03 NOTE — Progress Notes (Signed)
Sitter is present at bedside. ?

## 2021-10-03 NOTE — Progress Notes (Signed)
Patient is belligerent. Requested a Oncologist but this is ineffective. Safety observation order has been placed for a 1:1 sitter. Sitter present in room. ?

## 2021-10-03 NOTE — Assessment & Plan Note (Addendum)
Per family, this is due to seizure.  She normally does not talk for 2 or 3 days after seizure episode.  She has passed bedside speech eval. ?

## 2021-10-03 NOTE — Assessment & Plan Note (Signed)
Advised to quit.  

## 2021-10-03 NOTE — Discharge Summary (Signed)
?Physician Discharge Summary ?  ?Patient: [name] MRN: [id number] DOB: 1981-05-12  ?Admit date:     10/02/2021  ?Discharge date: 10/03/21  ?Discharge Physician: Sharen Hones  ? ?PCP: Maeola Sarah, MD  ? ?Recommendations at discharge:  ? ?Follow-up with neurology and psychiatry, try to switch off Klonopin to another seizure medicine. ?Do not stop Klonopin until seen by neurology and psychiatry and switch off. ?Follow-up with PCP in 1 week. ? ?Discharge Diagnoses: ?Principal Problem: ?  Seizure ([place]) ?Active Problems: ?  Opiate abuse, episodic ([place]) ?  Altered mental status ?  Leukocytosis ?  Intellectual disability ?  Undifferentiated schizophrenia ([place]) ?  Depression ?  Nonverbal ? ?Resolved Problems: ?  * No resolved hospital problems. * ? ?Hospital Course: ?[name] is a 41 year old female with history of seizure, bipolar, intellectual disability, history of dental caries, catatonia, who presents emergency department for chief concerns of seizures. ?Patient had a 3 episodes of seizure which was witnessed the emergency room, then she developed postictal confusion.She was giving IV Keppra and restart Klonopin. ?UA was negative for nitrates and leukocytes. ?UDS is positive for THC, benzos, TCA ?Urine pregnancy was negative. ? ?This morning, patient started waking up, currently she no longer has any confusion.  But still not able to talk.  Met the patient father and husband, father states that the patient has been having seizure for roughly 20 years, on average she has a seizure every 4 months.  After each episode, patient would not be able to talk for 2 or 3 days.  Per father, patient has already back to herself.  At this point, she is medically stable to discharge.  I discussed with neurology, will continue current Keppra and Klonopin.  Patient need to follow-up with neurology and psychiatry, to switch off Klonopin and switch to another antiseizure medicine. ? ?Assessment and Plan: ?* Seizure  ([place]) ?Patient was monitored overnight, she woke up, passed bedside swallow evaluation by nurse.  She is starting to say a few words, no confusion.  Per patient father, she has back to herself.  At this point, she will be discharged to home.  Anticipating started talking towards 3 days per her prior history.  Family is advised to bring her to the family doctor if she still cannot talk after 3 days. ? ?Nonverbal ?Per family, this is due to seizure.  She normally does not talk for 2 or 3 days after seizure episode.  She has passed bedside speech eval. ? ?Depression ? Resumed home paroxetine 20 mg nightly ? ?Undifferentiated schizophrenia ([place]) ?With diagnosis of bipolar disorder ?Continue quetiapine 400 mg nightly ? ?Leukocytosis ?This appears to be secondary to seizure.  I do not see any evidence of infection, antibiotics will be discontinued. ? ?Altered mental status ?Postictal.  Patient has woke up, no longer has any confusion. ? ?Opiate abuse, episodic ([place]) ?Advised to quit. ? ? ? ? ?  ? ? ?Consultants: Neurology ?Procedures performed: None  ?Disposition: Home ?Diet recommendation:  ?Discharge Diet Orders (From admission, onward)  ? ?  Start     Ordered  ? 10/03/21 0000  Diet - low sodium heart healthy       ? 10/03/21 1422  ? ?  ?  ? ?  ? ?Cardiac diet ?DISCHARGE MEDICATION: ?Allergies as of 10/03/2021   ? ?   Reactions  ? Latex Rash, Swelling  ? Other reaction(s): Unknown ?Skin turns red  ? Morphine Other (See Comments), Swelling  ? Patient states that  her reaction is that her skin gets a bit red.   No urticaria or airway difficulty.    ? Prednisone Other (See Comments)  ? Other reaction(s): Other (See Comments) ?Stayed up 30 days when pt. Was on prednisone ?insomnia  ? Buprenorphine Hcl-naloxone Hcl   ? Duloxetine Other (See Comments)  ? Unknown ?Unknown  ? Ketorolac Tromethamine   ? Lithium   ? drool  ? Tramadol   ? Other reaction(s): Other (See Comments) ?Seizures  ? Poison Ivy Extract [poison Ivy Extract]  Rash  ? Poison Oak Extract [poison Oak Extract] Rash  ? Sumac Rash  ? ?  ? ?  ?Medication List  ?  ? ?STOP taking these medications   ? ?diclofenac 75 MG EC tablet ?Commonly known as: VOLTAREN ?  ?Oxycodone HCl 10 MG Tabs ?  ? ?  ? ?TAKE these medications   ? ?Buprenorphine HCl-Naloxone HCl 8-2 MG Film ?Place 0.5 strips under the tongue 2 (two) times daily. ?  ?clonazePAM 1 MG tablet ?Commonly known as: KLONOPIN ?Take 1 mg by mouth 3 (three) times daily. ?  ?levETIRAcetam 1000 MG tablet ?Commonly known as: KEPPRA ?Take 1,000 mg by mouth 2 (two) times daily. ?  ?PARoxetine 20 MG tablet ?Commonly known as: PAXIL ?Take 1 tablet (20 mg total) by mouth at bedtime. ?  ?QUEtiapine 400 MG tablet ?Commonly known as: SEROQUEL ?Take 400-800 mg by mouth at bedtime. ?  ? ?  ? ? Follow-up Information   ? ? Maeola Sarah, MD Follow up in 1 week(s).   ?Specialty: Family Medicine ?Contact information: ?[address] ?[place] [address] ?[phone] ? ? ?  ?  ? ? [facility] Follow up in 2 week(s).   ?Contact information: ?[place] ?[phone] ? ?  ?  ? ?  ?  ? ?  ? ?Discharge Exam: ?Danley Danker Weights  ? 10/02/21 1325  ?Weight: 72.6 kg  ? ?General exam: Appears calm and comfortable  ?Respiratory system: Clear to auscultation. Respiratory effort normal. ?Cardiovascular system: S1 & S2 heard, RRR. No JVD, murmurs, rubs, gallops or clicks. No pedal edema. ?Gastrointestinal system: Abdomen is nondistended, soft and nontender. No organomegaly or masses felt. Normal bowel sounds heard. ?Central nervous system: Alert and oriented x2.  Still aphasic. ?Extremities: Symmetric 5 x 5 power. ?Skin: No rashes, lesions or ulcers ? ? ? ?Condition at discharge: fair ? ?The results of significant diagnostics from this hospitalization (including imaging, microbiology, ancillary and laboratory) are listed below for reference.  ? ?Imaging Studies: ?CT Head Wo  Contrast ? ?Result Date: 10/02/2021 ?CLINICAL DATA:  Headache. EXAM: CT HEAD WITHOUT CONTRAST TECHNIQUE: Contiguous axial images were obtained from the base of the skull through the vertex without intravenous contrast. RADIATION DOSE REDUCTION: This exam was performed according to the departmental dose-optimization program which includes automated exposure control, adjustment of the mA and/or kV according to patient size and/or use of iterative reconstruction technique. COMPARISON:  01/10/2017 FINDINGS: Brain: No evidence of acute infarction, hemorrhage, hydrocephalus, extra-axial collection or mass lesion/mass effect. Vascular: No hyperdense vessel or unexpected calcification. Skull: Normal. Negative for fracture or focal lesion. Sinuses/Orbits: Globes and orbits are unremarkable. Mild polypoid mucosal thickening in the floors of the maxillary sinuses. Minor ethmoid sinus mucosal thickening. Other: None. IMPRESSION: No intracranial abnormality. Electronically Signed   By: Lajean Manes M.D.   On: 10/02/2021 15:00   ? ?Microbiology: ?Results for orders placed or performed during the hospital encounter  of 10/02/21  ?CULTURE, BLOOD (ROUTINE X 2) w Reflex to ID Panel     Status: None (Preliminary result)  ? Collection Time: 10/03/21 12:46 AM  ? Specimen: BLOOD  ?Result Value Ref Range Status  ? Specimen Description BLOOD BLOOD LEFT WRIST  Final  ? Special Requests   Final  ?  BOTTLES DRAWN AEROBIC AND ANAEROBIC Blood Culture results may not be optimal due to an inadequate volume of blood received in culture bottles  ? Culture   Final  ?  NO GROWTH < 12 HOURS ?Performed at [facility], [address] [place], [place] [address] ?  ? Report Status PENDING  Incomplete  ?CULTURE, BLOOD (ROUTINE X 2) w Reflex to ID Panel     Status: None (Preliminary result)  ? Collection Time: 10/03/21 12:46 AM  ? Specimen: BLOOD  ?Result Value Ref Range Status  ? Specimen Description BLOOD BLOOD RIGHT HAND  Final  ? Special  Requests   Final  ?  BOTTLES DRAWN AEROBIC AND ANAEROBIC Blood Culture adequate volume  ? Culture   Final  ?  NO GROWTH < 12 HOURS ?Performed at [facility], [address] [place], [place] [address] ?  ? R

## 2021-10-03 NOTE — Plan of Care (Signed)
Patient will be educated on how to remain free from injuries caused due to her seizures, and how to cope with any anxiety related to her condition. ?

## 2021-10-03 NOTE — Progress Notes (Signed)
Family now states that she has run out of both the Klonopin and and Keppra.  ?I called her pharmacy, has  filled 4 days of Klonopin to fill the gap, patient has refills she can pick up. ?Also filled 30-day dose of Keppra.  ?

## 2021-10-03 NOTE — Progress Notes (Signed)
Patient is on the floor. Vital Signs taken. Patient is uncooperative and withdrawn. ?

## 2021-10-03 NOTE — Consult Note (Signed)
Neurology Consultation ?Reason for Consult: Seizures ?Requesting Physician: Sharen Hones ? ?CC: Seizure ? ?History is obtained from: Chart review  ? ?HPI: [name] is a 41 y.o. female with a past medical history of psychiatric disorders, seizures ? ?History limited from patient as she is nonverbal with me.  Attempted to reach family but there was Barbaraann Rondo was an incorrect number, [phone]), Father (call could not be completed as dialed, 3x checked number). Finally reached s.o. Charlsie Merles, witnessed her seizure, she has been out of Klonopin (2-3 days). He reports post ictally it's normal for her to not talk much.  ? ?Seizures reportedly started in March 2014 or 2013, with a prodrome of headache/feeling fuzzy prior to events followed by generalized shaking and loss of consciousness with 15 to 30 minutes of postevent confusion.  Records also describe some spells of nonsensical speech and other spells of upper extremity flexion.  She has provided variable history to different providers at different times per chart review.  She has certainly had withdrawal seizures in the setting of missing Klonopin doses. ? ?Per 01/2016 note she was also not tolerating Keppra well due to headache side effect (taking only 250 twice daily instead of 1000 twice daily) ? ?Home AEDs listed currently:  ?Keppra 1000 mg twice daily ?Klonopin 1 mg 3 times daily ? ?Per chart review in the past she has also tried ?Carbamazepine (possible poor control though questionable compliance) ?Lamotrigine ? ?Prior work-up: ?       MRI brain with and without contrast (11/17/2018) without any acute intracranial abnormality, though motion limited       ? ?MRI Brain (07/06/2012): ?FINDINGS: There is no focal signal abnormality. There is no evidence of intracranial hemorrhage, acute infarct, enhancing mass lesion, mass effect, or extra-axial fluid collection. There is no cortical abnormality identified on the double inversion sequence.The hippocampi  are symmetric in size and signal intensity. ?IMPRESSION: Unremarkable MRI of the brain.  ? ?Routine EEG 05/2013: ?TECHNICAL DESCRIPTION  ?The dominant posterior rhythm is a medium amplitude 8.0-9.0 Hz activity which attenuates with eye opening. The background is comprised of mixed frequency beta, alpha, theta, and delta range frequencies. On occasion delta range slowing is slightly more prominent in the left temporal head region. No interictal abnormalities are seen. Hyperventilation induces an excessive buildup in diffuse background slowing. Photic stimulation does not induce a symmetrical driving response and provoke no abnormal discharges. The patient fails to enter stage II sleep.  ?IMPRESSION  ?This EEG is study is abnormal secondary to mild diffuse slowing.  ? ?ROS: Unable to obtain due to altered mental status.  ? ?Past Medical History:  ?Diagnosis Date  ? Degeneration of lumbar intervertebral disc   ? Depression   ? DVT (deep vein thrombosis) in pregnancy   ? Schizoaffective disorder ([place])   ? Seizures ([place])   ? Shoulder pain, right   ? ?Past Surgical History:  ?Procedure Laterality Date  ? CESAREAN SECTION    ? X2  ? ?Current Outpatient Medications  ?Medication Instructions  ? Buprenorphine HCl-Naloxone HCl 8-2 MG FILM 0.5 strips, Sublingual, 2 times daily  ? clonazePAM (KLONOPIN) 1 mg, Oral, 3 times daily  ? diclofenac (VOLTAREN) 75 mg, 2 times daily PRN  ? levETIRAcetam (KEPPRA) 1,000 mg, Oral, 2 times daily  ? Oxycodone HCl 10 mg, 2 times daily  ? PARoxetine (PAXIL) 20 mg, Oral, Daily at bedtime  ? QUEtiapine (SEROQUEL) 400-800 mg, Oral, Daily at bedtime  ? ? ? ?Family History  ?Problem  Relation Age of Onset  ? Cancer Mother   ? ?Social History:  reports that she has been smoking cigarettes. She has been smoking an average of 1.5 packs per day. She has never used smokeless tobacco. She reports that she does not drink alcohol and does not use drugs. ? ? ?Exam: ?Current vital signs: ?BP 115/87 (BP  Location: Left Arm)   Pulse 98   Temp 98.8 ?F (37.1 ?C)   Resp 17   Ht 5\' 1"  (1.549 m)   Wt 72.6 kg   SpO2 96%   BMI 30.24 kg/m?  ?Vital signs in last 24 hours: ?Temp:  [97.7 ?F (36.5 ?C)-98.8 ?F (37.1 ?C)] 98.8 ?F (37.1 ?C) (04/09 0720) ?Pulse Rate:  [69-134] 98 (04/09 0720) ?Resp:  [15-25] 17 (04/09 0720) ?BP: (107-133)/(64-88) 115/87 (04/09 0720) ?SpO2:  [86 %-98 %] 96 % (04/09 0720) ?Weight:  [72.6 kg] 72.6 kg (04/08 1325) ? ? ?Physical Exam  ?Constitutional: Appears well-developed and well-nourished.  ?Psych: Affect flat, minimally cooperative  ?Eyes: No scleral injection ?HENT: No oropharyngeal obstruction.  ?MSK: no joint deformities.  ?Cardiovascular: Perfusing extremities well ?Respiratory: Effort normal, non-labored breathing ?GI: Soft.  No distension. There is no tenderness.  ?Skin: Warm dry and intact visible skin ? ?Neuro: ?Mental Status: ?Patient is awake, alert, does not answer orientation questions and does not give any history.  She does follow some simple commands but is inconsistent.  She does not appropriately to some yes/no questions, for example when asked her name, but at other times does not respond ?Cranial Nerves: ?II: Visual Fields are full to orientation to examiner in all fields. Pupils are equal, round, and reactive to light.   ?III,IV, VI: EOMI without ptosis or diploplia, however a question of mild left gaze preference which is difficult to assess given her limited cooperation.  ?V: Facial sensation is symmetric to temperature ?VII: Facial movement is symmetric.  ?VIII: hearing is intact to voice ?X: Uvula elevates symmetrically ?XI: Shoulder shrug is symmetric. ?XII: tongue is midline without atrophy or fasciculations.  ?Motor: ?Tone is normal. Bulk is normal.  Does not participate in confrontational strength testing.  Using both of her upper extremities equally and ambulates with equal use of her lower extremities ?Sensory: ?Reacts equally to touch in all 4 extremities ?Deep  Tendon Reflexes: ?3+ and symmetric in the brachioradialis and patellae.  ?Cerebellar: ?Unable to assess secondary to patient's mental status  ?Gait:  ?Ambulates to bathroom with assistance of tech, normal casual gait ? ?I have reviewed labs in epic and the results pertinent to this consultation are: ? ?Last metabolic panel ?Lab Results  ?Component Value Date  ? GLUCOSE 93 10/03/2021  ? NA 140 10/03/2021  ? K 3.7 10/03/2021  ? CL 109 10/03/2021  ? CO2 24 10/03/2021  ? BUN 12 10/03/2021  ? CREATININE 1.11 (H) 10/03/2021  ? GFRNONAA >60 10/03/2021  ? CALCIUM 8.4 (L) 10/03/2021  ? PHOS 2.8 10/03/2021  ? PROT 7.4 10/02/2021  ? ALBUMIN 3.6 10/02/2021  ? BILITOT 0.5 10/02/2021  ? ALKPHOS 68 10/02/2021  ? AST 37 10/02/2021  ? ALT 16 10/02/2021  ? ANIONGAP 7 10/03/2021  ? ? ? ?CBC: ?Recent Labs  ?Lab 10/02/21 ?1330 10/03/21 ?[id number]  ?WBC 15.4* 9.9  ?NEUTROABS 12.9* 6.7  ?HGB 13.9 12.4  ?HCT 42.2 36.7  ?MCV 95.0 92.9  ?PLT 279 224  ? ? ?Coagulation Studies: ?Recent Labs  ?  10/03/21 [id number]  ?LABPROT 13.5  ?INR 1.0  ? ?TSH within normal limits ? ? ?  UDS positive for benzodiazepines, cannabinoids, tricyclic ? ?UA negative for infection, pregnancy test negative ? ?I have reviewed the images obtained: ? ?CT head personally reviewed, agree with radiology that there is no evidence of acute intracranial abnormality ? ?Impression: I favor a breakthrough seizure in the setting of her missed Klonopin.  Anticipate gradual improvement as her postictal state clears.  Unfortunately Keppra levels were not checked prior to administration here at the hospital, will not make changes to this medication at this time. Keppra may not be the ideal agent in the patient with mood disorder, but again defer to outpatient providers given her extensive psychiatric history ? ?Recommendations: ?-Continue Keppra 1000 mg twice daily and Klonopin 1 mg 3 times daily at this time ? -Consider Depakote as an alternative agent when patient is verbal enough to give  more history and discuss medication changes ?-Given Klonopin is prescribed for psychiatric reasons, suggest on an outpatient basis her psychiatrist should consider an alternative agent that will not cause her to have

## 2021-10-03 NOTE — Discharge Instructions (Signed)
Please schedule follow-up with your psychiatrist in 2 weeks. ?

## 2021-10-03 NOTE — Progress Notes (Signed)
?  Transition of Care (TOC) Screening Note ? ? ?Patient Details  ?Name: [name] ?Date of Birth: Jul 25, 1980 ? ? ?Transition of Care (TOC) CM/SW Contact:    ?Gildardo Griffes, LCSW ?Phone Number: ?10/03/2021, 2:55 PM ? ? ? ?Transition of Care Department [facility]) has reviewed patient and no TOC needs have been identified at this time. We will continue to monitor patient advancement through interdisciplinary progression rounds. If new patient transition needs arise, please place a TOC consult. ? Angeline Slim, [place] ?[phone] ? ?

## 2021-10-04 NOTE — ED Provider Notes (Signed)
? ?[facility] ?Provider Note ? ? ? Event Date/Time  ? First MD Initiated Contact with Patient 10/02/21 1339   ?  (approximate) ? ? ?History  ? ?Seizures ? ? ?HPI ? ?[name] is a 41 y.o. female with a history of seizure disorder who comes from home reportedly having 2 seizures at home and 1 more in EMS.  She got some Versed and EMS.  She is here as not back to baseline at all.  She is somewhat confused.  We gave her some Keppra as she did not appear to have taken any of her Keppra today. ? ?  ? ? ?Physical Exam  ? ?Triage Vital Signs: ?ED Triage Vitals  ?Enc Vitals Group  ?   BP 10/02/21 1327 109/64  ?   Pulse Rate 10/02/21 1327 (!) 131  ?   Resp 10/02/21 1327 18  ?   Temp 10/02/21 1334 98.4 ?F (36.9 ?C)  ?   Temp Source 10/02/21 1334 Oral  ?   SpO2 10/02/21 1327 92 %  ?   Weight 10/02/21 1325 160 lb 0.9 oz (72.6 kg)  ?   Height 10/02/21 1325 5\' 1"  (1.549 m)  ?   Head Circumference --   ?   Peak Flow --   ?   Pain Score 10/02/21 2352 1  ?   Pain Loc --   ?   Pain Edu? --   ?   Excl. in GC? --   ? ? ?Most recent vital signs: ?Vitals:  ? 10/03/21 1125 10/03/21 1500  ?BP: 122/88 114/66  ?Pulse: 77 95  ?Resp: 18 17  ?Temp: 97.8 ?F (36.6 ?C) 98.1 ?F (36.7 ?C)  ?SpO2: 96% 99%  ? ? ? ?General: Awake, no distress.  Patient somewhat confused however ?Head normocephalic atraumatic ?CV:  Good peripheral perfusion.  Heart regular rate and rhythm no audible murmur ?Resp:  Normal effort.  Lungs are clear ?Abd:  No distention.  Abdomen soft and nontender ?Extremities without edema ?Neuro exam patient able to follow most of commands of the neuro exam appears to be nonfocal. ? ? ?ED Results / Procedures / Treatments  ? ?Labs ?(all labs ordered are listed, but only abnormal results are displayed) ?Labs Reviewed  ?COMPREHENSIVE METABOLIC PANEL - Abnormal; Notable for the following components:  ?    Result Value  ? CO2 16 (*)   ? Glucose, Bld 185 (*)   ? Creatinine, Ser 1.02 (*)   ? All other  components within normal limits  ?CBC WITH DIFFERENTIAL/PLATELET - Abnormal; Notable for the following components:  ? WBC 15.4 (*)   ? Neutro Abs 12.9 (*)   ? Abs Immature Granulocytes 0.11 (*)   ? All other components within normal limits  ?URINALYSIS, ROUTINE W REFLEX MICROSCOPIC - Abnormal; Notable for the following components:  ? Color, Urine YELLOW (*)   ? APPearance TURBID (*)   ? All other components within normal limits  ?URINE DRUG SCREEN, QUALITATIVE ([facility] ONLY) - Abnormal; Notable for the following components:  ? Tricyclic, Ur Screen POSITIVE (*)   ? Cannabinoid 50 Ng, Ur [place] POSITIVE (*)   ? Benzodiazepine, Ur Scrn POSITIVE (*)   ? All other components within normal limits  ?BASIC METABOLIC PANEL - Abnormal; Notable for the following components:  ? Creatinine, Ser 1.11 (*)   ? Calcium 8.4 (*)   ? All other components within normal limits  ?CREATININE, SERUM - Abnormal; Notable for the following components:  ?  Creatinine, Ser 1.17 (*)   ? All other components within normal limits  ?CULTURE, BLOOD (ROUTINE X 2)  ?CULTURE, BLOOD (ROUTINE X 2)  ?PREGNANCY, URINE  ?HIV ANTIBODY (ROUTINE TESTING W REFLEX)  ?MAGNESIUM  ?PHOSPHORUS  ?CBC WITH DIFFERENTIAL/PLATELET  ?PROTIME-INR  ?PROCALCITONIN  ?TSH  ?LACTIC ACID, PLASMA  ?LACTIC ACID, PLASMA  ? ? ? ?EKG ? ?EKG read interpreted by me shows normal sinus rhythm rate of 82 rightward axis flipped T's in V2 through 4.  No other apparent changes. ?These changes were present and September 2017 EKG ?RADIOLOGY ?Head CT read by radiology reviewed by me the films reviewed by me are negative ? ? ?PROCEDURES: ? ?Critical Care performed:  ? ?Procedures ? ? ?MEDICATIONS ORDERED IN ED: ?Medications  ?levETIRAcetam (KEPPRA) IVPB 1000 mg/100 mL premix (0 mg Intravenous Stopped 10/02/21 1402)  ?sodium chloride 0.9 % bolus 500 mL (0 mLs Intravenous Stopped 10/03/21 0246)  ?sodium chloride 0.9 % bolus 1,000 mL (0 mLs Intravenous Stopped 10/03/21 1548)  ?vancomycin (VANCOREADY) IVPB 1500  mg/300 mL (0 mg Intravenous Stopped 10/03/21 0700)  ?haloperidol lactate (HALDOL) injection 5 mg (5 mg Intravenous Given 10/03/21 0830)  ? ? ? ?IMPRESSION / MDM / ASSESSMENT AND PLAN / ED COURSE  ?I reviewed the triage vital signs and the nursing notes. ?Patient is not having any further seizures but has not returned to baseline.  We will admit her and watch her at least overnight until she returns to baseline possibly do an EEG in the morning if she has not. ? ?Differential diagnosis includes, but is not limited to, side effect of medicine she is taken off the street or from the Versed she was given or possible but less likely subclinical status.  Patient does not appear to have any twitching at all and appears to be recovering which is very slowly.  I think she is just postictal at this point. ? ? ?The patient is on the cardiac monitor to evaluate for evidence of arrhythmia and/or significant heart rate changes.  None were seen ? ?  ? ? ?FINAL CLINICAL IMPRESSION(S) / ED DIAGNOSES  ? ?Final diagnoses:  ?Seizures (HCC)  ?Altered mental status, unspecified altered mental status type  ? ? ? ?Rx / DC Orders  ? ?ED Discharge Orders   ? ?      Ordered  ?  Increase activity slowly       ? 10/03/21 1422  ?  Diet - low sodium heart healthy       ? 10/03/21 1422  ?  clonazePAM (KLONOPIN) 1 MG tablet  3 times daily       ? 10/03/21 1541  ?  levETIRAcetam (KEPPRA) 1000 MG tablet  2 times daily       ? 10/03/21 1541  ? ?  ?  ? ?  ? ? ? ?Note:  This document was prepared using Dragon voice recognition software and may include unintentional dictation errors. ?  ?Arnaldo Natal, MD ?10/04/21 2112 ? ?

## 2021-10-08 LAB — CULTURE, BLOOD (ROUTINE X 2)
Culture: NO GROWTH
Culture: NO GROWTH
Special Requests: ADEQUATE

## 2021-12-23 ENCOUNTER — Other Ambulatory Visit: Payer: Self-pay

## 2021-12-23 ENCOUNTER — Inpatient Hospital Stay
Admission: EM | Admit: 2021-12-23 | Discharge: 2021-12-26 | DRG: 101 | Disposition: A | Discharge status: Home / Self Care | Payer: Medicare Other | Attending: Internal Medicine | Admitting: Internal Medicine

## 2021-12-23 ENCOUNTER — Encounter: Payer: Self-pay | Admitting: *Deleted

## 2021-12-23 ENCOUNTER — Emergency Department: Payer: Medicare Other

## 2021-12-23 DIAGNOSIS — G43909 Migraine, unspecified, not intractable, without status migrainosus: Secondary | ICD-10-CM

## 2021-12-23 DIAGNOSIS — F17210 Nicotine dependence, cigarettes, uncomplicated: Secondary | ICD-10-CM | POA: Diagnosis present

## 2021-12-23 DIAGNOSIS — G40901 Epilepsy, unspecified, not intractable, with status epilepticus: Principal | ICD-10-CM

## 2021-12-23 DIAGNOSIS — D72829 Elevated white blood cell count, unspecified: Secondary | ICD-10-CM | POA: Diagnosis present

## 2021-12-23 DIAGNOSIS — Z79899 Other long term (current) drug therapy: Secondary | ICD-10-CM

## 2021-12-23 DIAGNOSIS — T426X6A Underdosing of other antiepileptic and sedative-hypnotic drugs, initial encounter: Secondary | ICD-10-CM | POA: Diagnosis present

## 2021-12-23 DIAGNOSIS — R9431 Abnormal electrocardiogram [ECG] [EKG]: Secondary | ICD-10-CM | POA: Diagnosis present

## 2021-12-23 DIAGNOSIS — F250 Schizoaffective disorder, bipolar type: Secondary | ICD-10-CM

## 2021-12-23 DIAGNOSIS — F1110 Opioid abuse, uncomplicated: Secondary | ICD-10-CM | POA: Diagnosis present

## 2021-12-23 DIAGNOSIS — E876 Hypokalemia: Secondary | ICD-10-CM

## 2021-12-23 DIAGNOSIS — Z888 Allergy status to other drugs, medicaments and biological substances status: Secondary | ICD-10-CM | POA: Diagnosis not present

## 2021-12-23 DIAGNOSIS — Z91040 Latex allergy status: Secondary | ICD-10-CM

## 2021-12-23 DIAGNOSIS — Z885 Allergy status to narcotic agent status: Secondary | ICD-10-CM | POA: Diagnosis not present

## 2021-12-23 DIAGNOSIS — F79 Unspecified intellectual disabilities: Secondary | ICD-10-CM | POA: Diagnosis present

## 2021-12-23 DIAGNOSIS — G43009 Migraine without aura, not intractable, without status migrainosus: Secondary | ICD-10-CM | POA: Diagnosis not present

## 2021-12-23 DIAGNOSIS — R400 Somnolence: Secondary | ICD-10-CM

## 2021-12-23 DIAGNOSIS — Z86718 Personal history of other venous thrombosis and embolism: Secondary | ICD-10-CM

## 2021-12-23 DIAGNOSIS — R569 Unspecified convulsions: Principal | ICD-10-CM

## 2021-12-23 DIAGNOSIS — R739 Hyperglycemia, unspecified: Secondary | ICD-10-CM | POA: Diagnosis present

## 2021-12-23 DIAGNOSIS — Z91138 Patient's unintentional underdosing of medication regimen for other reason: Secondary | ICD-10-CM | POA: Diagnosis not present

## 2021-12-23 DIAGNOSIS — R4182 Altered mental status, unspecified: Secondary | ICD-10-CM | POA: Diagnosis present

## 2021-12-23 LAB — CBC WITH DIFFERENTIAL/PLATELET
Abs Immature Granulocytes: 0.07 10*3/uL (ref 0.00–0.07)
Basophils Absolute: 0.1 10*3/uL (ref 0.0–0.1)
Basophils Relative: 1 %
Eosinophils Absolute: 0.1 10*3/uL (ref 0.0–0.5)
Eosinophils Relative: 0 %
HCT: 44.8 % (ref 36.0–46.0)
Hemoglobin: 15 g/dL (ref 12.0–15.0)
Immature Granulocytes: 1 %
Lymphocytes Relative: 11 %
Lymphs Abs: 1.6 10*3/uL (ref 0.7–4.0)
MCH: 30.8 pg (ref 26.0–34.0)
MCHC: 33.5 g/dL (ref 30.0–36.0)
MCV: 92 fL (ref 80.0–100.0)
Monocytes Absolute: 0.7 10*3/uL (ref 0.1–1.0)
Monocytes Relative: 5 %
Neutro Abs: 12 10*3/uL — ABNORMAL HIGH (ref 1.7–7.7)
Neutrophils Relative %: 82 %
Platelets: 337 10*3/uL (ref 150–400)
RBC: 4.87 MIL/uL (ref 3.87–5.11)
RDW: 12.6 % (ref 11.5–15.5)
WBC: 14.5 10*3/uL — ABNORMAL HIGH (ref 4.0–10.5)
nRBC: 0 % (ref 0.0–0.2)

## 2021-12-23 LAB — COMPREHENSIVE METABOLIC PANEL
ALT: 17 U/L (ref 0–44)
AST: 17 U/L (ref 15–41)
Albumin: 4.2 g/dL (ref 3.5–5.0)
Alkaline Phosphatase: 75 U/L (ref 38–126)
Anion gap: 9 (ref 5–15)
BUN: 10 mg/dL (ref 6–20)
CO2: 23 mmol/L (ref 22–32)
Calcium: 9.2 mg/dL (ref 8.9–10.3)
Chloride: 106 mmol/L (ref 98–111)
Creatinine, Ser: 0.72 mg/dL (ref 0.44–1.00)
GFR, Estimated: >60 mL/min (ref >60)
Glucose, Bld: 172 mg/dL — ABNORMAL HIGH (ref 70–99)
Potassium: 3.2 mmol/L — ABNORMAL LOW (ref 3.5–5.1)
Sodium: 138 mmol/L (ref 135–145)
Total Bilirubin: 0.8 mg/dL (ref 0.3–1.2)
Total Protein: 7.8 g/dL (ref 6.5–8.1)

## 2021-12-23 LAB — MAGNESIUM: Magnesium: 2.5 mg/dL — ABNORMAL HIGH (ref 1.7–2.4)

## 2021-12-23 MED ORDER — MAGNESIUM SULFATE 2 GM/50ML IV SOLN
2.0000 g | Freq: Once | INTRAVENOUS | Status: AC
  Administered 2021-12-23: 2 g via INTRAVENOUS
  Filled 2021-12-23: qty 50

## 2021-12-23 MED ORDER — SODIUM CHLORIDE 0.9 % IV SOLN
2000.0000 mg | Freq: Once | INTRAVENOUS | Status: AC
  Administered 2021-12-23: 2000 mg via INTRAVENOUS
  Filled 2021-12-23: qty 20

## 2021-12-23 MED ORDER — POTASSIUM CHLORIDE 20 MEQ PO PACK
40.0000 meq | PACK | Freq: Once | ORAL | Status: AC
  Administered 2021-12-23: 40 meq via ORAL
  Filled 2021-12-23: qty 2

## 2021-12-23 MED ORDER — LACTATED RINGERS IV BOLUS
1000.0000 mL | Freq: Once | INTRAVENOUS | Status: AC
  Administered 2021-12-23: 1000 mL via INTRAVENOUS

## 2021-12-23 MED ORDER — POTASSIUM CHLORIDE CRYS ER 20 MEQ PO TBCR
40.0000 meq | EXTENDED_RELEASE_TABLET | Freq: Once | ORAL | Status: DC
Start: 2021-12-23 — End: 2021-12-23

## 2021-12-23 NOTE — ED Provider Notes (Signed)
[facility] Provider Note    Event Date/Time   First MD Initiated Contact with Patient 12/23/21 2100     (approximate)   History   Seizures   HPI  [name] is a 41 y.o. female who presents to the ED for evaluation of Seizures   I reviewed DC summary from 4/9 medical admission for seizures.  His history of seizures, intellectual disability, bipolar disorder.   Patient presents to the ED for evaluation of a seizure that occurred earlier today at home.  Majority of history is provided by patient's daughter at the bedside as patient is altered.  She answers most questions with "yes."  She follows commands all 4 extremities, but does not provide much history.  No complaints.  Family reports that she ran out of her seizure medicine.  She was sitting on the couch prior to this happening without any acute events noted prior to this.  She was noted to have 2 seizures in quick succession on the couch at home and has had none since then.  Physical Exam   Triage Vital Signs: ED Triage Vitals [12/23/21 2031]  Enc Vitals Group     BP (!) 113/91     Pulse Rate (!) 130     Resp 20     Temp 99.4 F (37.4 C)     Temp Source Oral     SpO2 95 %     Weight 158 lb 11.7 oz (72 kg)     Height 5\' 1"  (1.549 m)     Head Circumference      Peak Flow      Pain Score      Pain Loc      Pain Edu?      Excl. in GC?     Most recent vital signs: Vitals:   12/23/21 2229 12/23/21 2230  BP:  118/80  Pulse: 88 88  Resp:  13  Temp:    SpO2:  95%    General: Awake, no distress.  Sitting up with cross legs in the stretcher, looking around the room and follows commands in all 4 extremities.  Follows multistep/complex commands.  Speech is not fluent, only one-word answers. CV:  Good peripheral perfusion.  Resp:  Normal effort.  Abd:  No distention.  Soft and benign MSK:  No deformity noted.  Neuro:  No focal deficits appreciated. Cranial nerves II through XII  intact 5/5 strength and sensation in all 4 extremities Other:     ED Results / Procedures / Treatments   Labs (all labs ordered are listed, but only abnormal results are displayed) Labs Reviewed  COMPREHENSIVE METABOLIC PANEL - Abnormal; Notable for the following components:      Result Value   Potassium 3.2 (*)    Glucose, Bld 172 (*)    All other components within normal limits  CBC WITH DIFFERENTIAL/PLATELET - Abnormal; Notable for the following components:   WBC 14.5 (*)    Neutro Abs 12.0 (*)    All other components within normal limits  URINALYSIS, ROUTINE W REFLEX MICROSCOPIC  URINE DRUG SCREEN, QUALITATIVE ([facility] ONLY)  POC URINE PREG, ED    EKG Sinus tachycardia with a rate of 131 bpm.  Normal axis.  Prolonged QTc of 578 ms.  RADIOLOGY   Official radiology report(s): No results found.  PROCEDURES and INTERVENTIONS:  .1-3 Lead EKG Interpretation  Performed by: 12/25/21, MD Authorized by: Delton Prairie, MD     Interpretation: normal  ECG rate:  90   ECG rate assessment: normal     Rhythm: sinus rhythm     Ectopy: none     Conduction: normal   .Critical Care  Performed by: Delton Prairie, MD Authorized by: Delton Prairie, MD   Critical care provider statement:    Critical care time (minutes):  30   Critical care time was exclusive of:  Separately billable procedures and treating other patients   Critical care was necessary to treat or prevent imminent or life-threatening deterioration of the following conditions:  CNS failure or compromise   Critical care was time spent personally by me on the following activities:  Development of treatment plan with patient or surrogate, discussions with consultants, evaluation of patient's response to treatment, examination of patient, ordering and review of laboratory studies, ordering and review of radiographic studies, ordering and performing treatments and interventions, pulse oximetry, re-evaluation of patient's  condition and review of old charts   Medications  magnesium sulfate IVPB 2 g 50 mL (has no administration in time range)  potassium chloride SA (KLOR-CON M) CR tablet 40 mEq (has no administration in time range)  lactated ringers bolus 1,000 mL (1,000 mLs Intravenous New Bag/Given 12/23/21 2141)  levETIRAcetam (KEPPRA) 2,000 mg in sodium chloride 0.9 % 250 mL IVPB (0 mg Intravenous Stopped 12/23/21 2200)     IMPRESSION / MDM / ASSESSMENT AND PLAN / ED COURSE  I reviewed the triage vital signs and the nursing notes.  Differential diagnosis includes, but is not limited to,   {Patient presents with symptoms of an acute illness or injury that is potentially life-threatening.  41 year old female with seizure disorder presents to the ED after a witnessed seizure.  She is nonfocal examination.  She is altered, without fluent speech, per the family but otherwise a fairly reassuring examination.  No seizure activity noted here in the ED.  She was loaded with Keppra IV.  Blood work with leukocytosis, possibly reactive.  Metabolic panel with hypokalemia.  EKG demonstrates prolonged QTc so she was empirically provided magnesium sulfate as we checked this serum value.  She is observed for a few hours without return to her baseline, we will CT scan her head and anticipate she will require medical admission.  Clinical Course as of 12/23/21 2257  Thu Dec 23, 2021  2245 Reassessed.  Still not back to baseline. [DS]    Clinical Course User Index [DS] Delton Prairie, MD     FINAL CLINICAL IMPRESSION(S) / ED DIAGNOSES   Final diagnoses:  Seizure (HCC)  Hypokalemia  Prolonged Q-T interval on ECG     Rx / DC Orders   ED Discharge Orders     None        Note:  This document was prepared using Dragon voice recognition software and may include unintentional dictation errors.   Delton Prairie, MD 12/23/21 2259

## 2021-12-23 NOTE — ED Notes (Signed)
Pt taken to CT.

## 2021-12-23 NOTE — H&P (Signed)
[place]   PATIENT NAME: [name]    MR#:  [id number]  DATE OF BIRTH:  Jan 06, 1981  DATE OF ADMISSION:  12/23/2021  PRIMARY CARE PHYSICIAN: Estell Harpin, MD   Patient is coming from: Home  REQUESTING/REFERRING PHYSICIAN: Ward, Layla Maw, DO  CHIEF COMPLAINT:   Chief Complaint  Patient presents with   Seizures    HISTORY OF PRESENT ILLNESS:  [name] is a 41 y.o. female with medical history significant for depression and schizoaffective disorder, tobacco abuse as well as seizure disorder and degenerative disc disease, who presented to the ER with acute onset of suspected seizure that was witnessed with tonic-clonic movements/generalized shaking followed by postictal confusion.  Her family stated that she may have been out of her seizure medications and therefore she was loaded with IV Keppra in the ER.  No paresthesias or focal muscle weakness.  No nausea or vomiting or abdominal pain.  No chest pain or dyspnea or cough.  She was fairly somnolent during my interview and history of was obtained through her family member.  ED Course: When she came to the ER,  heart rate was 130 with blood pressure 115/91 with otherwise normal vital signs.  Labs revealed hypokalemia 3.2 with a blood glucose of 172 and magnesium of 2.5.  CBC showed leukocytosis of 14.5 with neutrophilia.  Urine pregnancy test was negative.  UA showed 20 ketones and urine drug screen was positive for benzodiazepines cannabinoids and tricyclic's. EKG as reviewed by me : EKG showed sinus tachycardia with a rate of 131 with possible right ventricle hypertrophy and T wave inversion inferiorly Imaging: Noncontrasted CT scan shows chronic sinusitis with no acute intra abnormalities.  The patient was given 1 L bolus of IV lactated Ringer and 2 g of IV Keppra 2 g of IV magnesium sulfate and 40 mill Cabbell p.o. potassium chloride.  She will be admitted to a medical telemetry bed for further evaluation and  management. PAST MEDICAL HISTORY:   Past Medical History:  Diagnosis Date   Degeneration of lumbar intervertebral disc    Depression    DVT (deep vein thrombosis) in pregnancy    Schizoaffective disorder (HCC)    Seizures (HCC)    Shoulder pain, right     PAST SURGICAL HISTORY:   Past Surgical History:  Procedure Laterality Date   CESAREAN SECTION     X2    SOCIAL HISTORY:   Social History   Tobacco Use   Smoking status: Every Day    Packs/day: 1.50    Types: Cigarettes   Smokeless tobacco: Never  Substance Use Topics   Alcohol use: No    FAMILY HISTORY:   Family History  Problem Relation Age of Onset   Cancer Mother     DRUG ALLERGIES:   Allergies  Allergen Reactions   Latex Rash and Swelling    Other reaction(s): Unknown Skin turns red   Morphine Other (See Comments) and Swelling    Patient states that her reaction is that her skin gets a bit red.   No urticaria or airway difficulty.     Prednisone Other (See Comments)    Other reaction(s): Other (See Comments) Stayed up 30 days when pt. Was on prednisone insomnia   Buprenorphine Hcl-Naloxone Hcl    Duloxetine Other (See Comments)    Unknown Unknown   Ketorolac Tromethamine    Lithium     drool   Tramadol     Other reaction(s): Other (See  Comments) Seizures   Poison Ivy Extract [Poison Ivy Extract] Rash   Poison Oak Extract [Poison Oak Extract] Rash   Sumac Rash    REVIEW OF SYSTEMS:   ROS As per history of present illness. All pertinent systems were reviewed above. Constitutional, HEENT, cardiovascular, respiratory, GI, GU, musculoskeletal, neuro, psychiatric, endocrine, integumentary and hematologic systems were reviewed and are otherwise negative/unremarkable except for positive findings mentioned above in the HPI.   MEDICATIONS AT HOME:   Prior to Admission medications   Medication Sig Start Date End Date Taking? Authorizing Provider  Buprenorphine HCl-Naloxone HCl 8-2 MG FILM Place  0.5 strips under the tongue 2 (two) times daily. 09/27/21  Yes [provider]  clonazePAM (KLONOPIN) 1 MG tablet Take 1 tablet (1 mg total) by mouth 3 (three) times daily. 10/03/21  Yes Marrion Coy, MD  levETIRAcetam (KEPPRA) 1000 MG tablet Take 1 tablet (1,000 mg total) by mouth 2 (two) times daily. 10/03/21  Yes Marrion Coy, MD  oxyCODONE-acetaminophen (PERCOCET/ROXICET) 5-325 MG tablet Take 1 tablet by mouth every 6 (six) hours as needed. 12/20/21  Yes [provider]  PARoxetine (PAXIL) 20 MG tablet Take 1 tablet (20 mg total) by mouth at bedtime. 01/22/17  Yes Emily Filbert, MD  QUEtiapine (SEROQUEL) 400 MG tablet Take 400-800 mg by mouth at bedtime. 09/29/21  Yes [provider]  traMADol (ULTRAM) 50 MG tablet Take 50 mg by mouth every 6 (six) hours as needed. 12/20/21  Yes [provider]  [name] 3-0.02 MG tablet Take 1 tablet by mouth daily. 01/16/17 10/03/20  [provider]      VITAL SIGNS:  Blood pressure 109/65, pulse 72, temperature 99.4 F (37.4 C), temperature source Oral, resp. rate (!) 9, height 5\' 1"  (1.549 m), weight 72 kg, SpO2 96 %.  PHYSICAL EXAMINATION:  Physical Exam  GENERAL:  41 y.o.-year-old patient lying in the bed with no acute distress.  She was somnolent but arousable. EYES: Pupils equal, round, reactive to light and accommodation. No scleral icterus. Extraocular muscles intact.  HEENT: Head atraumatic, normocephalic. Oropharynx and nasopharynx clear.  NECK:  Supple, no jugular venous distention. No thyroid enlargement, no tenderness.  LUNGS: Normal breath sounds bilaterally, no wheezing, rales,rhonchi or crepitation. No use of accessory muscles of respiration.  CARDIOVASCULAR: Regular rate and rhythm, S1, S2 normal. No murmurs, rubs, or gallops.  ABDOMEN: Soft, nondistended, nontender. Bowel sounds present. No organomegaly or mass.  EXTREMITIES: No pedal edema, cyanosis, or clubbing.  NEUROLOGIC: Cranial nerves II  through XII are intact. Muscle strength 5/5 in all extremities. Sensation intact. Gait not checked.  PSYCHIATRIC: The patient is somnolent but arousable.  No good eye contact. SKIN: No obvious rash, lesion, or ulcer.   LABORATORY PANEL:   CBC Recent Labs  Lab 12/24/21 0526  WBC 9.2  HGB 12.5  HCT 38.0  PLT 302   ------------------------------------------------------------------------------------------------------------------  Chemistries  Recent Labs  Lab 12/23/21 2102 12/24/21 0526  NA 138 138  K 3.2* 3.3*  CL 106 107  CO2 23 22  GLUCOSE 172* 107*  BUN 10 9  CREATININE 0.72 0.69  CALCIUM 9.2 8.2*  MG 2.5*  --   AST 17  --   ALT 17  --   ALKPHOS 75  --   BILITOT 0.8  --    ------------------------------------------------------------------------------------------------------------------  Cardiac Enzymes No results for input(s): "TROPONINI" in the last 168 hours. ------------------------------------------------------------------------------------------------------------------  RADIOLOGY:  CT HEAD WO CONTRAST (12/26/21)  Result Date: 12/23/2021 CLINICAL DATA:  Prolonged postictal  period/altered mentation after seizure EXAM: CT HEAD WITHOUT CONTRAST TECHNIQUE: Contiguous axial images were obtained from the base of the skull through the vertex without intravenous contrast. RADIATION DOSE REDUCTION: This exam was performed according to the departmental dose-optimization program which includes automated exposure control, adjustment of the mA and/or kV according to patient size and/or use of iterative reconstruction technique. COMPARISON:  10/02/2021 FINDINGS: Brain: No acute intracranial abnormality. Specifically, no hemorrhage, hydrocephalus, mass lesion, acute infarction, or significant intracranial injury. Vascular: No hyperdense vessel or unexpected calcification. Skull: No acute calvarial abnormality. Sinuses/Orbits: Mucosal thickening in the maxillary sinuses and scattered  ethmoid air cells. No air-fluid levels. Other: None IMPRESSION: No acute intracranial abnormality. Chronic sinusitis. Electronically Signed   By: Charlett Nose M.D.   On: 12/23/2021 23:24      IMPRESSION AND PLAN:  Assessment and Plan: * Seizure [facility]) - The patient be admitted to a medical telemetry bed. - She will be monitored for breakthrough seizures. - We will continue her Keppra. - We will place on seizure precautions and as needed IV Ativan. - EEG and neurology consult will be obtained. - Dr. Iver Nestle was notified about the patient.  Hypokalemia - Potassium was replaced and will be followed.  Altered mental status - This is likely associated with postictal phase. - Neurology consult will be obtained as mentioned above.  Leukocytosis - This is likely secondary to stress demargination.  No current clear infectious etiology.  Schizoaffective disorder, bipolar type (HCC) - We will continue her Seroquel, Paxil and Klonopin.  Opiate abuse, episodic (HCC) - We will continue buprenorphine  HCl-naloxone.   DVT prophylaxis: Lovenox.  Advanced Care Planning:  Code Status: full code.  Family Communication:  The plan of care was discussed in details with the patient (and family). I answered all questions. The patient agreed to proceed with the above mentioned plan. Further management will depend upon hospital course. Disposition Plan: Back to previous home environment Consults called: Neurology All the records are reviewed and case discussed with ED provider.  Status is: Inpatient  At the time of the admission, it appears that the appropriate admission status for this patient is inpatient.  This is judged to be reasonable and necessary in order to provide the required intensity of service to ensure the patient's safety given the presenting symptoms, physical exam findings and initial radiographic and laboratory data in the context of comorbid conditions.  The patient requires inpatient  status due to high intensity of service, high risk of further deterioration and high frequency of surveillance required.  I certify that at the time of admission, it is my clinical judgment that the patient will require inpatient hospital care extending more than 2 midnights.                            Dispo: The patient is from: Home              Anticipated d/c is to: Home              Patient currently is not medically stable to d/c.              Difficult to place patient: No  Hannah Beat M.D on 12/24/2021 at 8:46 AM  [facility]   From 7 PM-7 AM, contact night-coverage [email or web address]  CC: Primary care physician; Estell Harpin, MD

## 2021-12-23 NOTE — ED Triage Notes (Addendum)
Pt brought in by father.  Pt had 2 seizures tonight.  Pt postictal.  Pt out of seizure meds   Pt sitting in wheelchair.  Pt unable to answer questions

## 2021-12-24 ENCOUNTER — Inpatient Hospital Stay
Admission: AD | Admit: 2021-12-24 | Payer: Medicaid Other | Source: Other Acute Inpatient Hospital | Admitting: Internal Medicine

## 2021-12-24 DIAGNOSIS — G43009 Migraine without aura, not intractable, without status migrainosus: Secondary | ICD-10-CM

## 2021-12-24 DIAGNOSIS — G40901 Epilepsy, unspecified, not intractable, with status epilepticus: Principal | ICD-10-CM

## 2021-12-24 DIAGNOSIS — F250 Schizoaffective disorder, bipolar type: Secondary | ICD-10-CM

## 2021-12-24 DIAGNOSIS — R569 Unspecified convulsions: Secondary | ICD-10-CM | POA: Diagnosis not present

## 2021-12-24 DIAGNOSIS — G43909 Migraine, unspecified, not intractable, without status migrainosus: Secondary | ICD-10-CM

## 2021-12-24 LAB — BASIC METABOLIC PANEL
Anion gap: 9 (ref 5–15)
BUN: 9 mg/dL (ref 6–20)
CO2: 22 mmol/L (ref 22–32)
Calcium: 8.2 mg/dL — ABNORMAL LOW (ref 8.9–10.3)
Chloride: 107 mmol/L (ref 98–111)
Creatinine, Ser: 0.69 mg/dL (ref 0.44–1.00)
GFR, Estimated: >60 mL/min (ref >60)
Glucose, Bld: 107 mg/dL — ABNORMAL HIGH (ref 70–99)
Potassium: 3.3 mmol/L — ABNORMAL LOW (ref 3.5–5.1)
Sodium: 138 mmol/L (ref 135–145)

## 2021-12-24 LAB — URINALYSIS, ROUTINE W REFLEX MICROSCOPIC
Bilirubin Urine: NEGATIVE
Glucose, UA: NEGATIVE mg/dL
Hgb urine dipstick: NEGATIVE
Ketones, ur: 20 mg/dL — AB
Leukocytes,Ua: NEGATIVE
Nitrite: NEGATIVE
Protein, ur: NEGATIVE mg/dL
Specific Gravity, Urine: 1.02 (ref 1.005–1.030)
pH: 5 (ref 5.0–8.0)

## 2021-12-24 LAB — CBC
HCT: 38 % (ref 36.0–46.0)
Hemoglobin: 12.5 g/dL (ref 12.0–15.0)
MCH: 30.3 pg (ref 26.0–34.0)
MCHC: 32.9 g/dL (ref 30.0–36.0)
MCV: 92.2 fL (ref 80.0–100.0)
Platelets: 302 10*3/uL (ref 150–400)
RBC: 4.12 MIL/uL (ref 3.87–5.11)
RDW: 12.5 % (ref 11.5–15.5)
WBC: 9.2 10*3/uL (ref 4.0–10.5)
nRBC: 0 % (ref 0.0–0.2)

## 2021-12-24 LAB — POC URINE PREG, ED: Preg Test, Ur: NEGATIVE

## 2021-12-24 LAB — URINE DRUG SCREEN, QUALITATIVE (ARMC ONLY)
Amphetamines, Ur Screen: NOT DETECTED
Barbiturates, Ur Screen: NOT DETECTED
Benzodiazepine, Ur Scrn: POSITIVE — AB
Cannabinoid 50 Ng, Ur ~~LOC~~: POSITIVE — AB
Cocaine Metabolite,Ur ~~LOC~~: NOT DETECTED
MDMA (Ecstasy)Ur Screen: NOT DETECTED
Methadone Scn, Ur: NOT DETECTED
Opiate, Ur Screen: NOT DETECTED
Phencyclidine (PCP) Ur S: NOT DETECTED
Tricyclic, Ur Screen: POSITIVE — AB

## 2021-12-24 MED ORDER — POTASSIUM CHLORIDE 20 MEQ PO PACK
40.0000 meq | PACK | Freq: Two times a day (BID) | ORAL | Status: AC
  Administered 2021-12-24 (×2): 40 meq via ORAL
  Filled 2021-12-24 (×2): qty 2

## 2021-12-24 MED ORDER — ONDANSETRON HCL 4 MG/2ML IJ SOLN: 4.0000 mg | Freq: Four times a day (QID) | INTRAMUSCULAR | Status: DC | PRN

## 2021-12-24 MED ORDER — SODIUM CHLORIDE 0.9 % IV SOLN: INTRAVENOUS | Status: DC

## 2021-12-24 MED ORDER — TRAZODONE HCL 50 MG PO TABS
25.0000 mg | ORAL_TABLET | Freq: Every evening | ORAL | Status: DC | PRN
  Administered 2021-12-24: 25 mg via ORAL
  Filled 2021-12-24: qty 1

## 2021-12-24 MED ORDER — LORAZEPAM 2 MG/ML IJ SOLN: 1.0000 mg | INTRAMUSCULAR | 0 refills | Status: DC | PRN

## 2021-12-24 MED ORDER — LORAZEPAM 2 MG/ML IJ SOLN
2.0000 mg | Freq: Once | INTRAMUSCULAR | Status: AC
  Administered 2021-12-24: 2 mg via INTRAVENOUS
  Filled 2021-12-24: qty 1

## 2021-12-24 MED ORDER — LEVETIRACETAM 500 MG PO TABS
1000.0000 mg | ORAL_TABLET | Freq: Two times a day (BID) | ORAL | Status: DC
  Administered 2021-12-24 – 2021-12-26 (×5): 1000 mg via ORAL
  Filled 2021-12-24 (×5): qty 2

## 2021-12-24 MED ORDER — SODIUM CHLORIDE 0.9 % IV SOLN
200.0000 mg | Freq: Once | INTRAVENOUS | Status: AC
  Administered 2021-12-24: 200 mg via INTRAVENOUS
  Filled 2021-12-24: qty 20

## 2021-12-24 MED ORDER — PAROXETINE HCL 20 MG PO TABS
20.0000 mg | ORAL_TABLET | Freq: Every day | ORAL | Status: DC
  Administered 2021-12-24 – 2021-12-25 (×2): 20 mg via ORAL
  Filled 2021-12-24 (×3): qty 1

## 2021-12-24 MED ORDER — LACOSAMIDE 200 MG/20ML IV SOLN
100.0000 mg | Freq: Two times a day (BID) | INTRAVENOUS | Status: DC
  Administered 2021-12-25 – 2021-12-26 (×3): 100 mg via INTRAVENOUS
  Filled 2021-12-24 (×4): qty 10

## 2021-12-24 MED ORDER — MAGNESIUM HYDROXIDE 400 MG/5ML PO SUSP: 30.0000 mL | Freq: Every day | ORAL | Status: DC | PRN

## 2021-12-24 MED ORDER — ACETAMINOPHEN 650 MG RE SUPP: 650.0000 mg | Freq: Four times a day (QID) | RECTAL | Status: DC | PRN

## 2021-12-24 MED ORDER — LORAZEPAM 2 MG/ML IJ SOLN: 1.0000 mg | INTRAMUSCULAR | Status: DC | PRN

## 2021-12-24 MED ORDER — CLONAZEPAM 0.5 MG PO TABS
1.0000 mg | ORAL_TABLET | Freq: Three times a day (TID) | ORAL | Status: DC
  Administered 2021-12-24 – 2021-12-26 (×8): 1 mg via ORAL
  Filled 2021-12-24 (×8): qty 2

## 2021-12-24 MED ORDER — ENOXAPARIN SODIUM 40 MG/0.4ML IJ SOSY
40.0000 mg | PREFILLED_SYRINGE | INTRAMUSCULAR | Status: DC
  Administered 2021-12-24 – 2021-12-26 (×3): 40 mg via SUBCUTANEOUS
  Filled 2021-12-24 (×3): qty 0.4

## 2021-12-24 MED ORDER — ACETAMINOPHEN 325 MG PO TABS
650.0000 mg | ORAL_TABLET | Freq: Four times a day (QID) | ORAL | Status: DC | PRN
  Administered 2021-12-24 – 2021-12-26 (×3): 650 mg via ORAL
  Filled 2021-12-24 (×3): qty 2

## 2021-12-24 MED ORDER — OXYCODONE-ACETAMINOPHEN 5-325 MG PO TABS
1.0000 | ORAL_TABLET | Freq: Four times a day (QID) | ORAL | Status: DC | PRN
  Administered 2021-12-24 – 2021-12-26 (×7): 1 via ORAL
  Filled 2021-12-24 (×7): qty 1

## 2021-12-24 MED ORDER — METOCLOPRAMIDE HCL 5 MG/ML IJ SOLN
5.0000 mg | Freq: Four times a day (QID) | INTRAMUSCULAR | Status: DC | PRN
Start: 2021-12-24 — End: 2021-12-25

## 2021-12-24 MED ORDER — ONDANSETRON HCL 4 MG PO TABS: 4.0000 mg | ORAL_TABLET | Freq: Four times a day (QID) | ORAL | Status: DC | PRN

## 2021-12-24 MED ORDER — QUETIAPINE FUMARATE 200 MG PO TABS
400.0000 mg | ORAL_TABLET | Freq: Every day | ORAL | Status: DC
  Administered 2021-12-24 – 2021-12-25 (×2): 800 mg via ORAL
  Filled 2021-12-24 (×3): qty 4

## 2021-12-24 MED ORDER — SODIUM CHLORIDE 0.9 % IV SOLN: 100.0000 mg | Freq: Two times a day (BID) | INTRAVENOUS | Status: DC

## 2021-12-24 MED ORDER — POTASSIUM CHLORIDE 20 MEQ PO PACK: 40.0000 meq | PACK | Freq: Two times a day (BID) | ORAL | Status: DC

## 2021-12-24 NOTE — ED Notes (Signed)
Pt given phone to call family.

## 2021-12-24 NOTE — Assessment & Plan Note (Signed)
-   We will continue her Seroquel, Paxil and Klonopin.

## 2021-12-24 NOTE — Assessment & Plan Note (Signed)
-   Potassium was replaced and will be followed.

## 2021-12-24 NOTE — Hospital Course (Addendum)
[name] is a 41 y.o. female with medical history significant for depression and schizoaffective disorder, tobacco abuse as well as seizure disorder and degenerative disc disease, who presented to the ER with acute onset of suspected seizure that was witnessed with tonic-clonic movements/generalized shaking followed by postictal confusion.  Patient last seizure was December last year.  Patient was loaded with Keppra in the emergency room and seen by neurology.   Neurology recommended continue Keppra 1000 milligram twice a day and clonazepam 1 mg 3 times a day. Patient had a EEG performed, discussed with neurology, condition consistent with status epilepticus.  Need continuous EEG monitoring and treatment in [facility] hospital.  We will transfer today.

## 2021-12-24 NOTE — Consult Note (Addendum)
Neurology Consultation Reason for Consult: Breakthrough seizures Requesting Physician: Sharen Hones  CC: Seizures  History is obtained from: Chart review and prior encounters; attempted to reach emergency contact but no answer and no family at bedside  HPI: [name] is a 41 y.o. female with a past medical history of psychiatric disorders (schizophrenia versus bipolar disorder +/- depression with element of substance abuse and possible catatonia), seizures  On my evaluation this morning, she is sitting up crosslegged in bed, in no acute distress.  Mostly answering questions by nodding or shaking her head although occasionally she says "yes" or "no".  She is not answering these questions reliably however  She came into the ED and was loaded with 2 g of Keppra and 2 g of magnesium prior to [place] drug levels being checked; there were reports that she had been out of her seizure medications to ED providers from family  I last saw her in February for breakthrough seizures.  At that time, and noted her history as follows: Seizures reportedly started in March 2014 or 2013, with a prodrome of headache/feeling fuzzy prior to events followed by generalized shaking and loss of consciousness with 15 to 30 minutes of postevent confusion.  Records also describe some spells of nonsensical speech and other spells of upper extremity flexion.  She has provided variable history to different providers at different times per chart review.  She has certainly had withdrawal seizures in the setting of missing Klonopin doses. Additionally, per family she is typically nonverbal for several days after seizures   Per 01/2016 note she was also not tolerating Keppra well due to headache side effect (taking only 250 twice daily instead of 1000 twice daily)   Home AEDs listed currently:  Keppra 1000 mg twice daily Klonopin 1 mg 3 times daily   Per chart review in the past she has also tried Carbamazepine  (possible poor control though questionable compliance) Lamotrigine   Prior work-up:        MRI brain with and without contrast (11/17/2018) without any acute intracranial abnormality, though motion limited         MRI Brain (07/06/2012): FINDINGS: There is no focal signal abnormality. There is no evidence of intracranial hemorrhage, acute infarct, enhancing mass lesion, mass effect, or extra-axial fluid collection. There is no cortical abnormality identified on the double inversion sequence.The hippocampi are symmetric in size and signal intensity. IMPRESSION: Unremarkable MRI of the brain.   Routine EEG 05/2013: TECHNICAL DESCRIPTION  The dominant posterior rhythm is a medium amplitude 8.0-9.0 Hz activity which attenuates with eye opening. The background is comprised of mixed frequency beta, alpha, theta, and delta range frequencies. On occasion delta range slowing is slightly more prominent in the left temporal head region. No interictal abnormalities are seen. Hyperventilation induces an excessive buildup in diffuse background slowing. Photic stimulation does not induce a symmetrical driving response and provoke no abnormal discharges. The patient fails to enter stage II sleep.  IMPRESSION  This EEG is study is abnormal secondary to mild diffuse slowing.     ROS: Unable to obtain due to altered mental status / baseline psychiatric conditions.   Past Medical History:  Diagnosis Date   Degeneration of lumbar intervertebral disc    Depression    DVT (deep vein thrombosis) in pregnancy    Schizoaffective disorder (HCC)    Seizures (HCC)    Shoulder pain, right    Past Surgical History:  Procedure Laterality Date   CESAREAN SECTION  X2   Current Outpatient Medications  Medication Instructions   Buprenorphine HCl-Naloxone HCl 8-2 MG FILM 0.5 strips, Sublingual, 2 times daily   clonazePAM (KLONOPIN) 1 mg, Oral, 3 times daily   levETIRAcetam (KEPPRA) 1,000 mg, Oral, 2 times daily    oxyCODONE-acetaminophen (PERCOCET/ROXICET) 5-325 MG tablet 1 tablet, Oral, Every 6 hours PRN   PARoxetine (PAXIL) 20 mg, Oral, Daily at bedtime   QUEtiapine (SEROQUEL) 400-800 mg, Oral, Daily at bedtime   traMADol (ULTRAM) 50 mg, Oral, Every 6 hours PRN     Family History  Problem Relation Age of Onset   Cancer Mother     Social History:  reports that she has been smoking cigarettes. She has been smoking an average of 1.5 packs per day. She has never used smokeless tobacco. She reports that she does not drink alcohol and does not use drugs.   Exam: Current vital signs: BP 109/65   Pulse 72   Temp 99.4 F (37.4 C) (Oral)   Resp (!) 9   Ht 5\' 1"  (1.549 m)   Wt 72 kg   LMP  (LMP Unknown)   SpO2 96%   BMI 29.99 kg/m  Vital signs in last 24 hours: Temp:  [99.4 F (37.4 C)] 99.4 F (37.4 C) (06/29 2031) Pulse Rate:  [72-130] 72 (06/30 0630) Resp:  [7-20] 9 (06/30 0630) BP: (108-127)/(65-91) 109/65 (06/30 0630) SpO2:  [91 %-97 %] 96 % (06/30 0630) Weight:  [72 kg] 72 kg (06/29 2031)   Physical Exam  Constitutional: Appears well-developed and well-nourished.  Psych: Affect calm and cooperative, interacts with examiner, not clearly responding to internal stimuli nor clearly having hallucinations Eyes: No scleral injection HENT: No oropharyngeal obstruction.  MSK: no joint deformities.  Cardiovascular: Perfusing extremities well Respiratory: Effort normal, non-labored breathing GI: Soft.  No distension. There is no tenderness.  Skin: Warm dry and intact visible skin  Neuro: Mental Status: Awake, alert, regards examiner and interacts.  Intermittently follows some commands which appears more volitional than secondary to confusion.  Nods/shakes her head to questions.  Does correctly shake her head no when asked if she is [name], but shakes her head no when she is asked if she was in the hospital.  Shakes her head no when she is asked if she takes Depakote, Keppra, clonazepam.   Cannot be sure she is reliably answering appropriately. Cranial Nerves: II: Visual Fields are full to blink to threat. Pupils are equal, round, and reactive to light 6 mm to 4 mm III,IV, VI: EOMI without ptosis to tracking examiner V: Facial sensation is symmetric to responsiveness to light cheek touch VII: Facial movement is symmetric.  VIII: hearing is intact to voice, orients to examiner Remainder unable to assess due to participation Motor/sensory: Tone is normal. Bulk is normal.  She is moving all 4 extremities equally to light noxious stim such as tickle of the feet, but she does not participate in pronator drift testing or formal strength testing Deep Tendon Reflexes: 2+ and symmetric in the brachioradialis, unable to assess patellar's due to patient participation Cerebellar/Gait:  Unable to assess due to patient participation, but her gross motor movements appear coordinated   I have reviewed labs in epic and the results pertinent to this consultation are: CMP notable for mild hypokalemia to 3.2, mild hypomagnesemia to 2.5, mild hyperglycemia to 172 on arrival, now 107 Creatinine 0.72 (at her baseline) Initial leukocytosis to 14.2, resolved on recheck this morning to 9.2 and CBC this morning within normal limits  Pregnancy test negative UA positive for ketones (20), otherwise negative UDS positive for benzodiazepines, cannabinoids, tricyclic's  I have reviewed the images obtained:  Head CT personally reviewed, agree with radiology negative for acute intracranial process   Impression: Per history is provided in the chart, this patient is presenting with her typical breakthrough seizures in the setting of likely missed medications.  In the past she typically has taken 3 to 4 days to return to her normal verbal baseline, which may be partially secondary to her psychiatric conditions versus prolonged postictal state.  Recommendations: -Continue Keppra 1000 mg twice daily -Continue  clonazepam 1 mg 3 times daily -Given it is unclear whether she is fully back to baseline, okay to obtain routine EEG -Neurology will follow up EEG but otherwise will be available on an as-needed basis.  Expect gradual improvement over several days as is typical for her after these episodes.  Please review seizure precautions below with family and included in her discharge paperwork.  In the past family has been comfortable taking her home before she is fully verbal again given her typical prolonged nonverbal state post breakthrough seizures. -Defer to primary team whether psychiatry consult may be useful as well versus continued outpatient follow-up -Please continue to encourage patient/family to establish with an outpatient neurologist as well given her frequent recent presentations for breakthrough seizure activity -Please see addendum below  Standard seizure precautions: Per [facility] statutes, patients with seizures are not allowed to drive until  they have been seizure-free for six months. Use caution when using heavy equipment or power tools. Avoid working on ladders or at heights. Take showers instead of baths. Ensure the water temperature is not too high on the home water heater. Do not go swimming alone. When caring for infants or small children, sit down when holding, feeding, or changing them to minimize risk of injury to the child in the event you have a seizure.  To reduce risk of seizures, maintain good sleep hygiene avoid alcohol and illicit drug use, take all anti-seizure medications as prescribed.   Lesleigh Noe MD-PhD [facility] [phone] [facility] coverage for [facility] is from 8 AM to 4 AM in-house and 4 PM to 8 PM by telephone/video. 8 PM to 8 AM emergent questions or overnight urgent questions should be addressed to Teleneurology On-call or [facility] neuro[facility]ist; contact information can be found on AMION   ADDENDUM:   EEG concerning  for ongoing seizure activity, full report from Dr. Hortense Ramal pending.   On exam, patient continues to nod yes inappropriately and intermittently follow some commands, moving all extremities grossly equally. (3:45 PM)  - Primary team to arrange for transfer to [facility] for LTM on EEG, neurology will follow along in consultation. At this time remains appropriate for a floor bed - Ativan 2 mg once - Load with vimpat 200 mg, then 100 mg BID (ordered)  - Continue Keppra 1000 mg BID - Continue Clonazepam 1 mg TID  - ECG repeated to confirm if prolonged Qtc remains present - if Qtc remains prolonged and no other etiology of prolonged QTc found in review with pharmacy and primary team, neurology will consider substituting and alternative AED such as Depkote (will need to discuss teratogenicity risk with family as patient is of childbearing age)  - If Qtc remains prolonged, review of meds per primary team, consider culprits of zofran, reglan, paxil, trazodone, seroquel; given significant psychiatric history consider involvement of psychiatry if these medications need to be adjusted  Discussed via secure chat with. Dr. Chipper Herb and via phone with Dr. Melynda Ripple and Dr. Otelia Limes. Dr. Wilford Corner also aware via secure chat.   Brooke Dare MD-PhD [facility] [phone]  [facility] coverage for [facility] is from 8 AM to 4 AM in-house and 4 PM to 8 PM by telephone/video. 8 PM to 8 AM emergent questions or overnight urgent questions should be addressed to Teleneurology On-call or [facility] neurohospitalist; contact information can be found on AMION

## 2021-12-24 NOTE — Procedures (Signed)
Patient Name: [name]  MRN: [id number]  Epilepsy Attending: Charlsie Quest  Referring Physician/Provider: Hannah Beat, MD  Date: 12/24/2021 Duration: 20.07 mins  Patient history: 41yo F with epilepsy admitted with breakthrough seizure. EEG to evaluate for seizure.  Level of alertness: Awake  AEDs during EEG study: LEV, Clonazepam  Technical aspects: This EEG study was done with scalp electrodes positioned according to the 10-20 International system of electrode placement. Electrical activity was acquired at a sampling rate of 500Hz  and reviewed with a high frequency filter of 70Hz  and a low frequency filter of 1Hz . EEG data were recorded continuously and digitally stored.   Description: No posterior dominant rhythm was seen. EEG showed near continuous generalized 3 to 6 Hz theta-delta slowing. Generalized periodic discharges were also noted intermittent at 2.5-3hz , lasting 3-12 seconds. Hyperventilation and photic stimulation were not performed.     ABNORMALITY - Periodic discharges, generalized ( GPDs) - Continuous slow, generalized  IMPRESSION: This study showed generalized periodic discharges. This pattern is on the ictal-interictal continuum with high potential for seizure recurrence. Additionally there is moderate diffuse encephalopathy, non specific etiology.  Recommend long term eeg monitoring for further characterization of EEG pattern.  Dr was notified.   [name] 

## 2021-12-24 NOTE — Assessment & Plan Note (Signed)
-   This is likely secondary to stress demargination.  No current clear infectious etiology.

## 2021-12-24 NOTE — Assessment & Plan Note (Signed)
-   We will continue buprenorphine  HCl-naloxone.

## 2021-12-24 NOTE — Assessment & Plan Note (Signed)
-   The patient be admitted to a medical telemetry bed. - She will be monitored for breakthrough seizures. - We will continue her Keppra. - We will place on seizure precautions and as needed IV Ativan. - EEG and neurology consult will be obtained. - Dr. Iver Nestle was notified about the patient.

## 2021-12-24 NOTE — Assessment & Plan Note (Signed)
-   This is likely associated with postictal phase. - Neurology consult will be obtained as mentioned above.

## 2021-12-24 NOTE — ED Notes (Signed)
Pt sitting in bed, awake when I entered room. Pt denies any needs. Answers most questions with "OK". Pt informed of pending EEG. "OK". NAD

## 2021-12-24 NOTE — Progress Notes (Addendum)
Eeg done At end of study-pt tried to get out bed-confused-had transport and security assist in getting pt back to room 101

## 2021-12-24 NOTE — Progress Notes (Signed)
  Progress Note   Patient: [name] UJW:[id number] DOB: 1980-12-21 DOA: 12/23/2021     1 DOS: the patient was seen and examined on 12/24/2021   Brief hospital course: [name] is a 41 y.o. female with medical history significant for depression and schizoaffective disorder, tobacco abuse as well as seizure disorder and degenerative disc disease, who presented to the ER with acute onset of suspected seizure that was witnessed with tonic-clonic movements/generalized shaking followed by postictal confusion.  Patient last seizure was December last year.  Patient was loaded with Keppra in the emergency room and seen by neurology. EEG is pending.  Neurology recommended continue Keppra 1000 milligram twice a day and clonazepam 1 mg 3 times a day.   Assessment and Plan: Seizure. Postictal confusion Patient has been loaded with Keppra, pending EEG. Continue current treatment outlined by neurology Planning to discharge tomorrow, patient will be continued on seizure precaution including no driving.  Migraine headache. Patient has a left-sided migraine headache since a seizure.  She had a history of migraine, but happens infrequently.  Continues on pain medicine.  Schizoaffective disorder. Continue home medicines.  Opioid abuse. Continue Suboxone maintenance  Hypokalemia. Repleted.     Subjective:  Patient is complaining significant left-sided migraine headache, but no nausea vomiting.  No longer has any confusion  Physical Exam: Vitals:   12/24/21 0630 12/24/21 0930 12/24/21 1000 12/24/21 1030  BP: 109/65 117/85 116/73 118/86  Pulse: 72 78 69 76  Resp: (!) 9 14 (!) 9 13  Temp:      TempSrc:      SpO2: 96% 97% 98% 99%  Weight:      Height:       General exam: Appears calm and comfortable  Respiratory system: Clear to auscultation. Respiratory effort normal. Cardiovascular system: S1 & S2 heard, RRR. No JVD, murmurs, rubs, gallops or clicks. No pedal  edema. Gastrointestinal system: Abdomen is nondistended, soft and nontender. No organomegaly or masses felt. Normal bowel sounds heard. Central nervous system: Alert and oriented. No focal neurological deficits. Extremities: Symmetric 5 x 5 power. Skin: No rashes, lesions or ulcers Psychiatry: Judgement and insight appear normal. Mood & affect appropriate.   Data Reviewed:  Reviewed the CT scan results, lab results  Family Communication:   Disposition: Status is: Inpatient Remains inpatient appropriate because: Severity of disease.  Planned Discharge Destination: Home    Time spent: 35 minutes  Author: Marrion Coy, MD 12/24/2021 2:00 PM  For on call review [email or web address].

## 2021-12-24 NOTE — Discharge Summary (Signed)
Physician Discharge Summary   Patient: [name] MRN: [id number] DOB: Mar 30, 1981  Admit date:     12/23/2021  Discharge date: 12/24/21  Discharge Physician: Marrion Coy   PCP: Estell Harpin, MD   Recommendations at discharge:   Transfer to [facility] for further treatment  Discharge Diagnoses: Principal Problem:   Seizure [facility]) Active Problems:   Hypokalemia   Altered mental status   Leukocytosis   Opiate abuse, episodic (HCC)   Schizoaffective disorder, bipolar type (HCC)   Migraine   Status epilepticus (HCC)  Resolved Problems:   * No resolved hospital problems. *  Hospital Course: [name] is a 41 y.o. female with medical history significant for depression and schizoaffective disorder, tobacco abuse as well as seizure disorder and degenerative disc disease, who presented to the ER with acute onset of suspected seizure that was witnessed with tonic-clonic movements/generalized shaking followed by postictal confusion.  Patient last seizure was December last year.  Patient was loaded with Keppra in the emergency room and seen by neurology.   Neurology recommended continue Keppra 1000 milligram twice a day and clonazepam 1 mg 3 times a day. Patient had a EEG performed, discussed with neurology, condition consistent with status epilepticus.  Need continuous EEG monitoring and treatment in [facility] hospital.  We will transfer today.    Assessment and Plan:  Status epilepticus Postictal confusion Patient has been loaded with Keppra However EEG showed evidence of status epilepticus, patient was given Vimpat.  Per neurology recommendation, patient be transferred to [facility] for further treatment.  Migraine headache. Patient has a left-sided migraine headache since a seizure.  She had a history of migraine, but happens infrequently.  Continues on pain medicine.  Schizoaffective disorder. Continue home medicines.  Opioid  abuse. Continue Suboxone maintenance   Hypokalemia. Repleted.      Consultants: Neurology Procedures performed: EEG  Disposition:  [facility] Diet recommendation:  Discharge Diet Orders (From admission, onward)     Start     Ordered   12/24/21 0000  Diet - low sodium heart healthy        12/24/21 1728           Carb modified diet DISCHARGE MEDICATION: Allergies as of 12/24/2021       Reactions   Latex Rash, Swelling   Other reaction(s): Unknown Skin turns red   Morphine Other (See Comments), Swelling   Patient states that her reaction is that her skin gets a bit red.   No urticaria or airway difficulty.     Prednisone Other (See Comments)   Other reaction(s): Other (See Comments) Stayed up 30 days when pt. Was on prednisone insomnia   Buprenorphine Hcl-naloxone Hcl    Duloxetine Other (See Comments)   Unknown Unknown   Ketorolac Tromethamine    Lithium    drool   Tramadol    Other reaction(s): Other (See Comments) Seizures   Poison Ivy Extract [poison Ivy Extract] Rash   Poison Oak Extract [poison Oak Extract] Rash   Sumac Rash        Medication List     TAKE these medications    Buprenorphine HCl-Naloxone HCl 8-2 MG Film Place 0.5 strips under the tongue 2 (two) times daily.   clonazePAM 1 MG tablet Commonly known as: KLONOPIN Take 1 tablet (1 mg total) by mouth 3 (three) times daily.   lacosamide 100 mg in sodium chloride 0.9 % 25 mL Inject 100 mg into the vein every  12 (twelve) hours. Start taking on: December 25, 2021   levETIRAcetam 1000 MG tablet Commonly known as: KEPPRA Take 1 tablet (1,000 mg total) by mouth 2 (two) times daily.   LORazepam 2 MG/ML injection Commonly known as: ATIVAN Inject 0.5 mLs (1 mg total) into the vein every hour as needed for seizure.   oxyCODONE-acetaminophen 5-325 MG tablet Commonly known as: PERCOCET/ROXICET Take 1 tablet by mouth every 6 (six) hours as needed.   PARoxetine 20 MG tablet Commonly  known as: PAXIL Take 1 tablet (20 mg total) by mouth at bedtime.   potassium chloride 20 MEQ packet Commonly known as: KLOR-CON Take 40 mEq by mouth 2 (two) times daily.   QUEtiapine 400 MG tablet Commonly known as: SEROQUEL Take 400-800 mg by mouth at bedtime.   traMADol 50 MG tablet Commonly known as: ULTRAM Take 50 mg by mouth every 6 (six) hours as needed.        Discharge Exam: Filed Weights   12/23/21 2031  Weight: 72 kg   General exam: Appears calm and comfortable  Respiratory system: Clear to auscultation. Respiratory effort normal. Cardiovascular system: S1 & S2 heard, RRR. No JVD, murmurs, rubs, gallops or clicks. No pedal edema. Gastrointestinal system: Abdomen is nondistended, soft and nontender. No organomegaly or masses felt. Normal bowel sounds heard. Central nervous system: Alert and oriented. No focal neurological deficits. Extremities: Symmetric 5 x 5 power. Skin: No rashes, lesions or ulcers Psychiatry: Judgement and insight appear normal. Mood & affect appropriate.    Condition at discharge: fair  The results of significant diagnostics from this hospitalization (including imaging, microbiology, ancillary and laboratory) are listed below for reference.   Imaging Studies: CT HEAD WO CONTRAST ( )  Result Date: 12/23/2021 CLINICAL DATA:  Prolonged postictal period/altered mentation after seizure EXAM: CT HEAD WITHOUT CONTRAST TECHNIQUE: Contiguous axial images were obtained from the base of the skull through the vertex without intravenous contrast. RADIATION DOSE REDUCTION: This exam was performed according to the departmental dose-optimization program which includes automated exposure control, adjustment of the mA and/or kV according to patient size and/or use of iterative reconstruction technique. COMPARISON:  10/02/2021 FINDINGS: Brain: No acute intracranial abnormality. Specifically, no hemorrhage, hydrocephalus, mass lesion, acute infarction, or  significant intracranial injury. Vascular: No hyperdense vessel or unexpected calcification. Skull: No acute calvarial abnormality. Sinuses/Orbits: Mucosal thickening in the maxillary sinuses and scattered ethmoid air cells. No air-fluid levels. Other: None IMPRESSION: No acute intracranial abnormality. Chronic sinusitis. Electronically Signed   By: Charlett Nose M.D.   On: 12/23/2021 23:24    Microbiology: Results for orders placed or performed during the hospital encounter of 10/02/21  CULTURE, BLOOD (ROUTINE X 2) w Reflex to ID Panel     Status: None   Collection Time: 10/03/21 12:46 AM   Specimen: BLOOD  Result Value Ref Range Status   Specimen Description BLOOD BLOOD LEFT WRIST  Final   Special Requests   Final    BOTTLES DRAWN AEROBIC AND ANAEROBIC Blood Culture results may not be optimal due to an inadequate volume of blood received in culture bottles   Culture   Final    NO GROWTH 5 DAYS Performed at [facility], [address] [place], [place] [address]    Report Status 10/08/2021 FINAL  Final  CULTURE, BLOOD (ROUTINE X 2) w Reflex to ID Panel     Status: None   Collection Time: 10/03/21 12:46 AM   Specimen: BLOOD  Result Value Ref Range Status   Specimen  Description BLOOD BLOOD RIGHT HAND  Final   Special Requests   Final    BOTTLES DRAWN AEROBIC AND ANAEROBIC Blood Culture adequate volume   Culture   Final    NO GROWTH 5 DAYS Performed at [facility], [address] [place], [place] [address]    Report Status 10/08/2021 FINAL  Final    Labs: CBC: Recent Labs  Lab 12/23/21 2102 12/24/21 0526  WBC 14.5* 9.2  NEUTROABS 12.0*  --   HGB 15.0 12.5  HCT 44.8 38.0  MCV 92.0 92.2  PLT 337 [id number]   Basic Metabolic Panel: Recent Labs  Lab 12/23/21 2102 12/24/21 0526  NA 138 138  K 3.2* 3.3*  CL 106 107  CO2 23 22  GLUCOSE 172* 107*  BUN 10 9  CREATININE 0.72 0.69  CALCIUM 9.2 8.2*  MG 2.5*  --    Liver Function Tests: Recent Labs  Lab  12/23/21 2102  AST 17  ALT 17  ALKPHOS 75  BILITOT 0.8  PROT 7.8  ALBUMIN 4.2   CBG: No results for input(s): "GLUCAP" in the last 168 hours.  Discharge time spent: greater than 30 minutes.  Signed: Sharen Hones, MD [facility] 12/24/2021

## 2021-12-25 DIAGNOSIS — R9431 Abnormal electrocardiogram [ECG] [EKG]: Secondary | ICD-10-CM | POA: Diagnosis not present

## 2021-12-25 DIAGNOSIS — R569 Unspecified convulsions: Secondary | ICD-10-CM | POA: Diagnosis not present

## 2021-12-25 DIAGNOSIS — G43009 Migraine without aura, not intractable, without status migrainosus: Secondary | ICD-10-CM | POA: Diagnosis not present

## 2021-12-25 DIAGNOSIS — G40901 Epilepsy, unspecified, not intractable, with status epilepticus: Secondary | ICD-10-CM | POA: Diagnosis not present

## 2021-12-25 LAB — BASIC METABOLIC PANEL
Anion gap: 6 (ref 5–15)
BUN: 8 mg/dL (ref 6–20)
CO2: 22 mmol/L (ref 22–32)
Calcium: 8.4 mg/dL — ABNORMAL LOW (ref 8.9–10.3)
Chloride: 113 mmol/L — ABNORMAL HIGH (ref 98–111)
Creatinine, Ser: 0.73 mg/dL (ref 0.44–1.00)
GFR, Estimated: >60 mL/min (ref >60)
Glucose, Bld: 87 mg/dL (ref 70–99)
Potassium: 3.4 mmol/L — ABNORMAL LOW (ref 3.5–5.1)
Sodium: 141 mmol/L (ref 135–145)

## 2021-12-25 LAB — MAGNESIUM: Magnesium: 2.4 mg/dL (ref 1.7–2.4)

## 2021-12-25 MED ORDER — NICOTINE 21 MG/24HR TD PT24
21.0000 mg | MEDICATED_PATCH | Freq: Every day | TRANSDERMAL | Status: DC
  Administered 2021-12-25 – 2021-12-26 (×2): 21 mg via TRANSDERMAL
  Filled 2021-12-25 (×2): qty 1

## 2021-12-25 MED ORDER — MELATONIN 5 MG PO TABS
2.5000 mg | ORAL_TABLET | Freq: Every day | ORAL | Status: DC
  Administered 2021-12-25: 2.5 mg via ORAL
  Filled 2021-12-25: qty 1

## 2021-12-25 MED ORDER — POTASSIUM CHLORIDE CRYS ER 20 MEQ PO TBCR
40.0000 meq | EXTENDED_RELEASE_TABLET | ORAL | Status: AC
  Administered 2021-12-25 (×2): 40 meq via ORAL
  Filled 2021-12-25 (×2): qty 2

## 2021-12-25 MED ORDER — POTASSIUM CHLORIDE 20 MEQ PO PACK: 40.0000 meq | PACK | ORAL | Status: DC

## 2021-12-25 NOTE — Progress Notes (Signed)
  Progress Note   Patient: [name] JHE:[id number] DOB: 06/08/1981 DOA: 12/23/2021     2 DOS: the patient was seen and examined on 12/25/2021   Brief hospital course: [name] is a 41 y.o. female with medical history significant for depression and schizoaffective disorder, tobacco abuse as well as seizure disorder and degenerative disc disease, who presented to the ER with acute onset of suspected seizure that was witnessed with tonic-clonic movements/generalized shaking followed by postictal confusion.  Patient last seizure was December last year.  Patient was loaded with Keppra in the emergency room and seen by neurology.   Neurology recommended continue Keppra 1000 milligram twice a day and clonazepam 1 mg 3 times a day. 6/30.  EEG showed some evidence of status epilepticus, scheduled transfer to [facility] for continued EEG monitoring. 7/1.  Patient condition seem to be improving, neurology no longer concerned about status epilepticus.  Transfer is canceled.  Continue to monitor.    Assessment and Plan: Seizure. Postictal confusion Migraine like headache. Patient condition had improved today, headache is also better. No longer has any confusion.  Discussed with neurology, at this point, continue Keppra and Vimpat.  QTc prolongation. I reviewed all patient EKGs performed during this hospitalization, telemetry monitoring strips.  Patient has true QT prolongation.  I will discontinue Reglan, Zofran, and trazodone. Continue to monitor closely on telemetry.   Schizoaffective disorder. Continue home medicines.  Opioid abuse. Continue Suboxone maintenance   Hypokalemia. Potassium 3.4 today, will give additional 80 mEq of oral potassium.  Recheck level tomorrow.      Subjective:  Patient condition much improved today, headache much improved, speech improved.  No longer has any confusion.  Physical Exam: Vitals:   12/24/21 1000 12/24/21 1030  12/24/21 1422 12/24/21 1711  BP: 116/73 118/86 104/66 97/79  Pulse: 69 76 (!) 59 65  Resp: (!) 9 13 15 18   Temp:   98.3 F (36.8 C) 98.3 F (36.8 C)  TempSrc:    Oral  SpO2: 98% 99% 100% 98%  Weight:      Height:       General exam: Appears calm and comfortable  Respiratory system: Clear to auscultation. Respiratory effort normal. Cardiovascular system: S1 & S2 heard, RRR. No JVD, murmurs, rubs, gallops or clicks. No pedal edema. Gastrointestinal system: Abdomen is nondistended, soft and nontender. No organomegaly or masses felt. Normal bowel sounds heard. Central nervous system: Alert and oriented. No focal neurological deficits. Extremities: Symmetric 5 x 5 power. Skin: No rashes, lesions or ulcers Psychiatry: Judgement and insight appear normal. Mood & affect appropriate.   Data Reviewed:  Lab results reviewed, EEG results reviewed.  Family Communication:   Disposition: Status is: Inpatient Remains inpatient appropriate because: Severity of disease, need neurological monitoring.  Planned Discharge Destination: Home    Time spent: 55 minutes  Author: , MD 12/25/2021 11:03 AM  For on call review [email or web address].

## 2021-12-25 NOTE — Progress Notes (Addendum)
Neurology Progress Note  Patient ID: [name] is a 41 y.o. with PMHx of  has a past medical history of Degeneration of lumbar intervertebral disc, Depression, DVT (deep vein thrombosis) in pregnancy, Schizoaffective disorder (HCC), Seizures (HCC), and Shoulder pain, right.   Subjective: - Reports some pain this morning but much more interactive and appropriate - Additional history from family (a significant other, Samara Snide; father's number listed in chart was not in service and uncles number is listed as an incorrect number), she is not taking any birth control to their knowledge although they note she does not want to have any children at this time - Family reports no knowledge of QTc prolongation in this patient previously  Exam: Vitals:   12/24/21 1422 12/24/21 1711  BP: 104/66 97/79  Pulse: (!) 59 65  Resp: 15 18  Temp: 98.3 F (36.8 C) 98.3 F (36.8 C)  SpO2: 100% 98%   Gen: In bed, comfortable  Resp: non-labored breathing, no grossly audible wheezing Cardiac: Perfusing extremities well  Abd: soft, nt  Neuro: MS: Sleeping but awakens easily, follows simple commands briskly.  Names thumb, incompletely repeats "it is sunny outside right now" reports it is 2018 and appears surprised when I tell her it is 2023, tells me she does not know the name of the hospital she is but able to repeat back to me that it is [facility]: Some nystagmus on initial eye opening, EOMI, visual fields full to orienting to stimuli in all quadrants, face symmetric, tongue midline Motor: Mild asterixis bilateral upper extremities, no pronator drift Sensory: Equally responsive to light touch in all 4 extremities.  Reports sensation correctly on the left and right leg and reports correctly double simultaneous stimuli  Pertinent Labs:  Basic Metabolic Panel: Recent Labs  Lab 12/23/21 2102 12/24/21 0526 12/25/21 0608  NA 138 138 141  K 3.2* 3.3* 3.4*  CL 106 107 113*  CO2 23  22 22   GLUCOSE 172* 107* 87  BUN 10 9 8   CREATININE 0.72 0.69 0.73  CALCIUM 9.2 8.2* 8.4*  MG 2.5*  --  2.4    CBC: Recent Labs  Lab 12/23/21 2102 12/24/21 0526  WBC 14.5* 9.2  NEUTROABS 12.0*  --   HGB 15.0 12.5  HCT 44.8 38.0  MCV 92.0 92.2  PLT 337 302    Impression:   Examination much improving, and therefore very low probability that the patient is continuing to have intermittent seizures.  Long-term I continue to think that Depakote would be a better option for this patient than Keppra especially with possibility that Keppra is contributing to her QTc prolongation.  However I do think that the QTc prolongation is more likely related to her psychiatric medications and defer management of the same to her primary team/psychiatry; will ask for the assistance of pharmacy.  For now, given the teratogenicity of Depakote and the changing legal landscape for elective pregnancy termination in the 2103, I will defer changing Keppra to Depakote at this time  Recommendations: -Continue Vimpat 100 mg twice daily -Continue Keppra 1000 mg twice daily -Pharmacy consult placed for assistance with considering medication adjustments for QTc prolongation -Discussed with family close outpatient follow-up with PCP/Ob-Gyn for birth control options followed by close outpatient follow-up with neurology for consideration of transition from Keppra to Depakote; this recommendation should be included in discharge instructions and discharge summary -No indication for transfer to 12/26/21 for LTM at this time -Patient's family will be visiting  around 2 PM today.  If they do feel that the patient is at her baseline, no further neurological work-up is indicated and I am comfortable with her discharging if primary team is comfortable with her QTc.  If patient is still here tomorrow I will continue to follow tomorrow.  Please reach out to neurology if there is additional concern for seizure activity or  other acute neurological questions today  Greater than 50 minutes were spent in care of this patient today the majority of which was in direct patient care and obtaining additional history from family.  Additional discussion/coordination with the primary team in person and via secure chat  Brooke Dare MD-PhD [facility] [phone]

## 2021-12-26 DIAGNOSIS — F1110 Opioid abuse, uncomplicated: Secondary | ICD-10-CM | POA: Diagnosis not present

## 2021-12-26 DIAGNOSIS — G40901 Epilepsy, unspecified, not intractable, with status epilepticus: Secondary | ICD-10-CM | POA: Diagnosis not present

## 2021-12-26 DIAGNOSIS — F250 Schizoaffective disorder, bipolar type: Secondary | ICD-10-CM | POA: Diagnosis not present

## 2021-12-26 LAB — BASIC METABOLIC PANEL
Anion gap: 5 (ref 5–15)
BUN: 7 mg/dL (ref 6–20)
CO2: 22 mmol/L (ref 22–32)
Calcium: 8.6 mg/dL — ABNORMAL LOW (ref 8.9–10.3)
Chloride: 113 mmol/L — ABNORMAL HIGH (ref 98–111)
Creatinine, Ser: 0.63 mg/dL (ref 0.44–1.00)
GFR, Estimated: >60 mL/min (ref >60)
Glucose, Bld: 96 mg/dL (ref 70–99)
Potassium: 3.6 mmol/L (ref 3.5–5.1)
Sodium: 140 mmol/L (ref 135–145)

## 2021-12-26 LAB — GLUCOSE, CAPILLARY: Glucose-Capillary: 106 mg/dL — ABNORMAL HIGH (ref 70–99)

## 2021-12-26 MED ORDER — LACOSAMIDE 100 MG PO TABS: 100.0000 mg | ORAL_TABLET | Freq: Two times a day (BID) | ORAL | 0 refills | Status: DC

## 2021-12-26 MED ORDER — NICOTINE 21 MG/24HR TD PT24: 21.0000 mg | MEDICATED_PATCH | Freq: Every day | TRANSDERMAL | 0 refills | Status: DC

## 2021-12-26 MED ORDER — CLONAZEPAM 1 MG PO TABS: 1.0000 mg | ORAL_TABLET | Freq: Three times a day (TID) | ORAL | 0 refills | Status: DC

## 2021-12-26 NOTE — Discharge Summary (Signed)
Physician Discharge Summary   Patient: [name] MRN: [id number] DOB: 05-Apr-1981  Admit date:     12/23/2021  Discharge date: 12/26/21  Discharge Physician: Marrion Coy   PCP: Estell Harpin, MD   Recommendations at discharge:   Follow-up with PCP in 1 week. Follow-up with neurology in 2 weeks.  Discharge Diagnoses: Principal Problem:   Seizure (HCC) Active Problems:   Hypokalemia   Altered mental status   Leukocytosis   Opiate abuse, episodic (HCC)   Schizoaffective disorder, bipolar type (HCC)   Migraine   Status epilepticus (HCC)   Prolonged QT interval  Resolved Problems:   * No resolved hospital problems. *  Hospital Course: [name] is a 41 y.o. female with medical history significant for depression and schizoaffective disorder, tobacco abuse as well as seizure disorder and degenerative disc disease, who presented to the ER with acute onset of suspected seizure that was witnessed with tonic-clonic movements/generalized shaking followed by postictal confusion.  Patient last seizure was December last year.  Patient was loaded with Keppra in the emergency room and seen by neurology.   Neurology recommended continue Keppra 1000 milligram twice a day and clonazepam 1 mg 3 times a day. 6/30.  EEG showed some evidence of status epilepticus, scheduled transfer to [facility] for continued EEG monitoring. 7/1.  Patient condition seem to be improving, neurology no longer concerned about status epilepticus.  Transfer is canceled.  Continue to monitor. 7/2.  Patient condition continued to improve.  Patient has not confusion, no additional seizure activity.  Discussed with neurology, patient is medically stable to be discharged.   Assessment and Plan:  Seizure. Postictal confusion Migraine like headache. Patient condition had improved, discussed with neurology, patient is medically stable to be discharged. Continue Keppra, Vimpat and Klonopin.   Headache resolved.  Patient will be followed by neurology and PCP as outpatient.  QTc prolongation. Patient did not have any arrhythmia in the hospital.  However, she does have significant QT prolongation on multiple EKG and telemetry strips.  However, due to her seizure disorder, we could not make much changes.  And also due to her schizoaffective disorder, could not make much changes seizure.  Please follow-up with PCP and the psychiatry in the future to see if any changes can be made.  I will try to discontinue Suboxone, she has not been taking in the hospital for the last 3 days    Schizoaffective disorder. Continue home medicines.  Opioid abuse. Continue Suboxone maintenance   Hypokalemia. Potassium has normalized      Consultants: Neurology Procedures performed: None  Disposition: Home Diet recommendation:  Discharge Diet Orders (From admission, onward)     Start     Ordered   12/26/21 0000  Diet - low sodium heart healthy        12/26/21 0946   12/24/21 0000  Diet - low sodium heart healthy        12/24/21 1728           Cardiac diet DISCHARGE MEDICATION: Allergies as of 12/26/2021       Reactions   Latex Rash, Swelling   Other reaction(s): Unknown Skin turns red   Morphine Other (See Comments), Swelling   Patient states that her reaction is that her skin gets a bit red.   No urticaria or airway difficulty.     Prednisone Other (See Comments)   Other reaction(s): Other (See Comments) Stayed up 30 days when pt. Was on prednisone insomnia  Buprenorphine Hcl-naloxone Hcl    Duloxetine Other (See Comments)   Unknown Unknown   Ketorolac Tromethamine    Lithium    drool   Tramadol    Other reaction(s): Other (See Comments) Seizures   Poison Ivy Extract [poison Ivy Extract] Rash   Poison Oak Extract [poison Oak Extract] Rash   Sumac Rash        Medication List     STOP taking these medications    Buprenorphine HCl-Naloxone HCl 8-2 MG Film    traMADol 50 MG tablet Commonly known as: ULTRAM       TAKE these medications    clonazePAM 1 MG tablet Commonly known as: KLONOPIN Take 1 tablet (1 mg total) by mouth 3 (three) times daily.   Lacosamide 100 MG Tabs Commonly known as: Vimpat Take 1 tablet (100 mg total) by mouth 2 (two) times daily.   levETIRAcetam 1000 MG tablet Commonly known as: KEPPRA Take 1 tablet (1,000 mg total) by mouth 2 (two) times daily.   nicotine 21 mg/24hr patch Commonly known as: NICODERM CQ - dosed in mg/24 hours Place 1 patch (21 mg total) onto the skin daily. Start taking on: December 27, 2021   oxyCODONE-acetaminophen 5-325 MG tablet Commonly known as: PERCOCET/ROXICET Take 1 tablet by mouth every 6 (six) hours as needed.   PARoxetine 20 MG tablet Commonly known as: PAXIL Take 1 tablet (20 mg total) by mouth at bedtime.   QUEtiapine 400 MG tablet Commonly known as: SEROQUEL Take 400-800 mg by mouth at bedtime.        Follow-up Information     Estell Harpin, MD Follow up in 1 week(s).   Specialty: Family Medicine Contact information: [address] [place] [address] [phone]         [facility] Follow up in 2 week(s).   Contact information: [address] [place] [address] [phone]               Discharge Exam: Ceasar Mons Weights   12/23/21 2031  Weight: 72 kg   General exam: Appears calm and comfortable  Respiratory system: Clear to auscultation. Respiratory effort normal. Cardiovascular system: S1 & S2 heard, RRR. No JVD, murmurs, rubs, gallops or clicks. No pedal edema. Gastrointestinal system: Abdomen is nondistended, soft and nontender. No organomegaly or masses felt. Normal bowel sounds heard. Central nervous system: Alert and oriented. No focal neurological deficits. Extremities: Symmetric 5 x 5 power. Skin: No rashes, lesions or ulcers Psychiatry: Judgement and insight appear normal.  Mood & affect appropriate.    Condition at discharge: good  The results of significant diagnostics from this hospitalization (including imaging, microbiology, ancillary and laboratory) are listed below for reference.   Imaging Studies: EEG adult  Result Date: 2022-01-13 Charlsie Quest, MD     2022/01/13  6:55 PM Patient Name: [name] MRN: [id number] Epilepsy Attending: Charlsie Quest Referring Physician/Provider: Hannah Beat, MD Date: January 13, 2022 Duration: 20.07 mins Patient history: 41yo F with epilepsy admitted with breakthrough seizure. EEG to evaluate for seizure. Level of alertness: Awake AEDs during EEG study: LEV, Clonazepam Technical aspects: This EEG study was done with scalp electrodes positioned according to the 10-20 International system of electrode placement. Electrical activity was acquired at a sampling rate of 500Hz  and reviewed with a high frequency filter of 70Hz  and a low frequency filter of 1Hz . EEG data were recorded continuously and digitally stored. Description: No posterior dominant rhythm was seen. EEG showed  near continuous generalized 3 to 6 Hz theta-delta slowing. Generalized periodic discharges were also noted intermittent at 2.5-3hz , lasting 3-12 seconds. Hyperventilation and photic stimulation were not performed.   ABNORMALITY - Periodic discharges, generalized ( GPDs) - Continuous slow, generalized IMPRESSION: This study showed generalized periodic discharges. This pattern is on the ictal-interictal continuum with high potential for seizure recurrence. Additionally there is moderate diffuse encephalopathy, non specific etiology. Recommend long term eeg monitoring for further characterization of EEG pattern. Dr Iver Nestle was notified. Priyanka Annabelle Harman   CT HEAD WO CONTRAST ( )  Result Date: 12/23/2021 CLINICAL DATA:  Prolonged postictal period/altered mentation after seizure EXAM: CT HEAD WITHOUT CONTRAST TECHNIQUE: Contiguous axial images were obtained  from the base of the skull through the vertex without intravenous contrast. RADIATION DOSE REDUCTION: This exam was performed according to the departmental dose-optimization program which includes automated exposure control, adjustment of the mA and/or kV according to patient size and/or use of iterative reconstruction technique. COMPARISON:  10/02/2021 FINDINGS: Brain: No acute intracranial abnormality. Specifically, no hemorrhage, hydrocephalus, mass lesion, acute infarction, or significant intracranial injury. Vascular: No hyperdense vessel or unexpected calcification. Skull: No acute calvarial abnormality. Sinuses/Orbits: Mucosal thickening in the maxillary sinuses and scattered ethmoid air cells. No air-fluid levels. Other: None IMPRESSION: No acute intracranial abnormality. Chronic sinusitis. Electronically Signed   By: Charlett Nose M.D.   On: 12/23/2021 23:24    Microbiology: Results for orders placed or performed during the hospital encounter of 10/02/21  CULTURE, BLOOD (ROUTINE X 2) w Reflex to ID Panel     Status: None   Collection Time: 10/03/21 12:46 AM   Specimen: BLOOD  Result Value Ref Range Status   Specimen Description BLOOD BLOOD LEFT WRIST  Final   Special Requests   Final    BOTTLES DRAWN AEROBIC AND ANAEROBIC Blood Culture results may not be optimal due to an inadequate volume of blood received in culture bottles   Culture   Final    NO GROWTH 5 DAYS Performed at [facility], [address] [place], [place] [address]    Report Status 10/08/2021 FINAL  Final  CULTURE, BLOOD (ROUTINE X 2) w Reflex to ID Panel     Status: None   Collection Time: 10/03/21 12:46 AM   Specimen: BLOOD  Result Value Ref Range Status   Specimen Description BLOOD BLOOD RIGHT HAND  Final   Special Requests   Final    BOTTLES DRAWN AEROBIC AND ANAEROBIC Blood Culture adequate volume   Culture   Final    NO GROWTH 5 DAYS Performed at [facility], [address]  [place], [place] [address]    Report Status 10/08/2021 FINAL  Final    Labs: CBC: Recent Labs  Lab 12/23/21 2102 12/24/21 0526  WBC 14.5* 9.2  NEUTROABS 12.0*  --   HGB 15.0 12.5  HCT 44.8 38.0  MCV 92.0 92.2  PLT 337 302   Basic Metabolic Panel: Recent Labs  Lab 12/23/21 2102 12/24/21 0526 12/25/21 0608 12/26/21 0600  NA 138 138 141 140  K 3.2* 3.3* 3.4* 3.6  CL 106 107 113* 113*  CO2 23 22 22 22   GLUCOSE 172* 107* 87 96  BUN 10 9 8 7   CREATININE 0.72 0.69 0.73 0.63  CALCIUM 9.2 8.2* 8.4* 8.6*  MG 2.5*  --  2.4  --    Liver Function Tests: Recent Labs  Lab 12/23/21 2102  AST 17  ALT 17  ALKPHOS 75  BILITOT 0.8  PROT 7.8  ALBUMIN 4.2   CBG: No results for input(s): "GLUCAP" in the last 168 hours.  Discharge time spent: greater than 30 minutes.  Signed: Marrion Coy, MD [facility] 12/26/2021

## 2021-12-26 NOTE — Evaluation (Signed)
Physical Therapy Evaluation Patient Details Name: [name] MRN: [id number] DOB: July 10, 1980 Today's Date: 12/26/2021  History of Present Illness  Patient is a 41 year old female who presents to ER for acute onset of suspected seizure that was witnessed with tonic clonic movements followed by positcal confusion. Patient has PMH of depression, schizoaffective disorder, tobacco abuse, seizure disorder, degenerative disc disease, opiate abuse, intellectual disability.   Clinical Impression  Patient is a pleasant 41 year old female who presents with limited stability with mobility. Prior to hospital admission, pt was independent and lives at home with her father, significant other, and daughter. Patient's history is limited by occasional incorrect responses such as when asked about stairs she states "I'm at [facility]". She is able to follow commands with additional time for processing. Patient is in bed upon PT arrival, wakens easily by PT and agreeable to evaluation. She is able to don pants and socks with mod I and increased time. She does ambulate without an AD but has unsteadiness with higher level activities such as head turns. Patient returned to bed with needs met.  Pt would benefit from skilled PT to address noted impairments and functional limitations (see below for any additional details).  Upon hospital discharge, pt would benefit from outpatient physical therapy for balance.        Recommendations for follow up therapy are one component of a multi-disciplinary discharge planning process, led by the attending physician.  Recommendations may be updated based on patient status, additional functional criteria and insurance authorization.  Follow Up Recommendations Outpatient PT      Assistance Recommended at Discharge    Patient can return home with the following  Assistance with cooking/housework    Equipment Recommendations None recommended by PT  Recommendations for  Other Services       Functional Status Assessment Patient has had a recent decline in their functional status and demonstrates the ability to make significant improvements in function in a reasonable and predictable amount of time.     Precautions / Restrictions Precautions Precautions: Fall;Other (comment) (seizure) Restrictions Weight Bearing Restrictions: No      Mobility  Bed Mobility Overal bed mobility: Independent                  Transfers Overall transfer level: Modified independent                 General transfer comment: supervision due to patient having slight sway    Ambulation/Gait Ambulation/Gait assistance: Min guard Gait Distance (Feet): 160 Feet Assistive device: None Gait Pattern/deviations: Step-through pattern, Narrow base of support, Drifts right/left       General Gait Details: Patient has instability with ambulation, limited in ability to perform higher level balance tasks.  Stairs            Wheelchair Mobility    Modified Rankin (Stroke Patients Only)       Balance Overall balance assessment: Needs assistance Sitting-balance support: No upper extremity supported Sitting balance-Leahy Scale: Fair     Standing balance support: No upper extremity supported Standing balance-Leahy Scale: Fair               High level balance activites: Direction changes, Turns, Sudden stops, Head turns High Level Balance Comments: unstable with head turns and change of directions requiring close CGA.             Pertinent Vitals/Pain Pain Assessment Pain Assessment: 0-10 Pain Score: 6  Pain Location: back Pain  Descriptors / Indicators: Aching Pain Intervention(s): Monitored during session    Home Living Family/patient expects to be discharged to:: Private residence Living Arrangements: Spouse/significant other;Children;Parent Available Help at Discharge: Family Type of Home: House Home Access: Level entry        Home Layout:  (patient did not answer despite being asked twice)   Additional Comments: Patient history limited by patient cognition    Prior Function               Mobility Comments: Per patient she is independent however patient has limited response to questions.       Hand Dominance   Dominant Hand: Right    Extremity/Trunk Assessment   Upper Extremity Assessment Upper Extremity Assessment: Overall WFL for tasks assessed    Lower Extremity Assessment Lower Extremity Assessment: Generalized weakness (grossly 4/5)       Communication   Communication: Other (comment) (takes additional time to process questions)  Cognition Arousal/Alertness: Awake/alert Behavior During Therapy: Flat affect Overall Cognitive Status: No family/caregiver present to determine baseline cognitive functioning                                 General Comments: Patient has intermittent response to questions with innapropriate answers, for example does your house have stairs she answers "im at [facility]".        General Comments General comments (skin integrity, edema, etc.): Patient appears well nourished and well groomed    Exercises Other Exercises Other Exercises: Patient educated on role of PT in acute care setting, safe mobility and transfers, donning pants.   Assessment/Plan    PT Assessment Patient needs continued PT services  PT Problem List Decreased strength;Decreased activity tolerance;Decreased balance;Decreased mobility;Decreased safety awareness       PT Treatment Interventions Gait training;Stair training;Functional mobility training;Neuromuscular re-education;Balance training;Therapeutic exercise;Therapeutic activities;Patient/family education    PT Goals (Current goals can be found in the Care Plan section)  Acute Rehab PT Goals Patient Stated Goal: to return home PT Goal Formulation: With patient Time For Goal Achievement: 01/09/22 Potential  to Achieve Goals: Fair    Frequency Min 2X/week     Co-evaluation               AM-PAC PT "6 Clicks" Mobility  Outcome Measure Help needed turning from your back to your side while in a flat bed without using bedrails?: None Help needed moving from lying on your back to sitting on the side of a flat bed without using bedrails?: None Help needed moving to and from a bed to a chair (including a wheelchair)?: None Help needed standing up from a chair using your arms (e.g., wheelchair or bedside chair)?: None Help needed to walk in hospital room?: A Little Help needed climbing 3-5 steps with a railing? : A Little 6 Click Score: 22    End of Session Equipment Utilized During Treatment: Gait belt Activity Tolerance: Patient tolerated treatment well Patient left: in bed;with bed alarm set Nurse Communication: Mobility status PT Visit Diagnosis: Unsteadiness on feet (R26.81);Other abnormalities of gait and mobility (R26.89);Muscle weakness (generalized) (M62.81)    Time: 2878-6767 PT Time Calculation (min) (ACUTE ONLY): 25 min   Charges:   PT Evaluation $PT Eval Moderate Complexity: 1 Mod PT Treatments $Therapeutic Activity: 8-22 mins        Janna Arch, PT, DPT  12/26/2021, 1:18 PM

## 2021-12-26 NOTE — Progress Notes (Signed)
Occupational Therapy * Physical Therapy * Speech Therapy          DATE 12/26/21 PATIENT NAME: [name] PATIENT MRN: #[id number]  DIAGNOSIS/DIAGNOSIS CODE: R56.9 DATE OF DISCHARGE: 12/26/21  PRIMARY CARE PHYSICIAN: Dr. Hendricks Milo Vines_ PCP PHONE/FAX: Fax: [phone]     Dear Provider Name: [facility] [place] [facility] Fax: [phone]   I certify that I have examined this patient and that occupational/physical/speech therapy is necessary on an outpatient basis.    The patient has expressed interest in completing their recommended course of therapy at your location.  Once a formal order from the patient's primary care physician has been obtained, please contact him/her to schedule an appointment for evaluation at your earliest convenience.   [ X ]  Physical Therapy Evaluate and Treat          [ X ]  Occupational Therapy Evaluate and Treat               [  ]  Speech Therapy Evaluate and Treat   The patient's primary care physician (listed above) must furnish and be responsible for a formal order such that the recommended services may be furnished while under the primary physician's care, and that the plan of care will be established and reviewed every 30 days (or more often if condition necessitates).

## 2021-12-26 NOTE — Progress Notes (Signed)
Received Md order to discharge patient to home.  Reviewed discharge instructions, homes meds, prescriptions and follow- up appointments with patient and patient verbalized understanding

## 2021-12-26 NOTE — TOC Transition Note (Signed)
Transition of Care [facility]) - CM/SW Discharge Note   Patient Details  Name: Linnet Bottari MRN: [id number] Date of Birth: 06/23/1981  [facility] CM/SW Contact:  Bing Quarry, RN Phone Number: 12/26/2021, 12:26 PM   Clinical Narrative: Pt evaluation for outpatient PT on disharge. List sent to patient and CM spoke with patient over the phone regarding choice of facility. Patient sleepy due to seizure medication but she did repeat what CM was saying and chose [facility] in [place] and that spouse could take her to these appointments. Referral from placed in Epic for co-sign. PT evaluation pending. Gabriel Cirri RN CM     Final next level of care: OP Rehab (Chose [facility] [place] [facility] facility.) Barriers to Discharge: Barriers Resolved   Patient Goals and CMS Choice   CMS Medicare.gov Compare Post Acute Care list provided to:: Patient    Discharge Placement                       Discharge Plan and Services                DME Arranged: N/A DME Agency: NA       HH Arranged: NA HH Agency: NA        Social Determinants of Health (SDOH) Interventions     Readmission Risk Interventions     No data to display

## 2022-03-16 ENCOUNTER — Other Ambulatory Visit: Payer: Self-pay

## 2022-03-16 ENCOUNTER — Emergency Department
Admission: EM | Admit: 2022-03-16 | Discharge: 2022-03-16 | Disposition: A | Discharge status: Other Acute Inpatient Hospital | Payer: Medicare Other | Attending: Emergency Medicine | Admitting: Emergency Medicine

## 2022-03-16 ENCOUNTER — Inpatient Hospital Stay
Admission: AD | Admit: 2022-03-16 | Discharge: 2022-03-22 | DRG: 885 | Disposition: A | Discharge status: Home / Self Care | Payer: Medicare Other | Source: Intra-hospital | Attending: Psychiatry | Admitting: Psychiatry

## 2022-03-16 DIAGNOSIS — F17210 Nicotine dependence, cigarettes, uncomplicated: Secondary | ICD-10-CM | POA: Diagnosis present

## 2022-03-16 DIAGNOSIS — Z885 Allergy status to narcotic agent status: Secondary | ICD-10-CM | POA: Diagnosis not present

## 2022-03-16 DIAGNOSIS — R569 Unspecified convulsions: Secondary | ICD-10-CM

## 2022-03-16 DIAGNOSIS — Z79899 Other long term (current) drug therapy: Secondary | ICD-10-CM | POA: Insufficient documentation

## 2022-03-16 DIAGNOSIS — Z888 Allergy status to other drugs, medicaments and biological substances status: Secondary | ICD-10-CM

## 2022-03-16 DIAGNOSIS — R4182 Altered mental status, unspecified: Secondary | ICD-10-CM | POA: Diagnosis present

## 2022-03-16 DIAGNOSIS — F32A Depression, unspecified: Secondary | ICD-10-CM | POA: Diagnosis present

## 2022-03-16 DIAGNOSIS — G40909 Epilepsy, unspecified, not intractable, without status epilepticus: Secondary | ICD-10-CM | POA: Insufficient documentation

## 2022-03-16 DIAGNOSIS — F250 Schizoaffective disorder, bipolar type: Secondary | ICD-10-CM | POA: Insufficient documentation

## 2022-03-16 DIAGNOSIS — R4689 Other symptoms and signs involving appearance and behavior: Secondary | ICD-10-CM

## 2022-03-16 DIAGNOSIS — K5900 Constipation, unspecified: Secondary | ICD-10-CM | POA: Diagnosis not present

## 2022-03-16 DIAGNOSIS — F251 Schizoaffective disorder, depressive type: Secondary | ICD-10-CM | POA: Diagnosis not present

## 2022-03-16 DIAGNOSIS — M25511 Pain in right shoulder: Secondary | ICD-10-CM | POA: Diagnosis present

## 2022-03-16 DIAGNOSIS — N390 Urinary tract infection, site not specified: Secondary | ICD-10-CM | POA: Diagnosis present

## 2022-03-16 DIAGNOSIS — F79 Unspecified intellectual disabilities: Secondary | ICD-10-CM | POA: Insufficient documentation

## 2022-03-16 DIAGNOSIS — Z20822 Contact with and (suspected) exposure to covid-19: Secondary | ICD-10-CM | POA: Diagnosis not present

## 2022-03-16 DIAGNOSIS — Z560 Unemployment, unspecified: Secondary | ICD-10-CM

## 2022-03-16 DIAGNOSIS — F1190 Opioid use, unspecified, uncomplicated: Secondary | ICD-10-CM | POA: Diagnosis present

## 2022-03-16 DIAGNOSIS — G8929 Other chronic pain: Secondary | ICD-10-CM | POA: Diagnosis present

## 2022-03-16 DIAGNOSIS — Z91040 Latex allergy status: Secondary | ICD-10-CM

## 2022-03-16 DIAGNOSIS — F203 Undifferentiated schizophrenia: Secondary | ICD-10-CM | POA: Diagnosis present

## 2022-03-16 DIAGNOSIS — G4700 Insomnia, unspecified: Secondary | ICD-10-CM | POA: Diagnosis present

## 2022-03-16 DIAGNOSIS — F419 Anxiety disorder, unspecified: Secondary | ICD-10-CM | POA: Diagnosis present

## 2022-03-16 DIAGNOSIS — K219 Gastro-esophageal reflux disease without esophagitis: Secondary | ICD-10-CM | POA: Diagnosis present

## 2022-03-16 DIAGNOSIS — Z86718 Personal history of other venous thrombosis and embolism: Secondary | ICD-10-CM

## 2022-03-16 DIAGNOSIS — F259 Schizoaffective disorder, unspecified: Secondary | ICD-10-CM | POA: Diagnosis present

## 2022-03-16 DIAGNOSIS — Z809 Family history of malignant neoplasm, unspecified: Secondary | ICD-10-CM

## 2022-03-16 DIAGNOSIS — Z046 Encounter for general psychiatric examination, requested by authority: Secondary | ICD-10-CM | POA: Diagnosis present

## 2022-03-16 LAB — SALICYLATE LEVEL: Salicylate Lvl: <7 mg/dL — ABNORMAL LOW (ref 7.0–30.0)

## 2022-03-16 LAB — COMPREHENSIVE METABOLIC PANEL
ALT: 14 U/L (ref 0–44)
AST: 16 U/L (ref 15–41)
Albumin: 3.9 g/dL (ref 3.5–5.0)
Alkaline Phosphatase: 74 U/L (ref 38–126)
Anion gap: 8 (ref 5–15)
BUN: 7 mg/dL (ref 6–20)
CO2: 24 mmol/L (ref 22–32)
Calcium: 8.6 mg/dL — ABNORMAL LOW (ref 8.9–10.3)
Chloride: 105 mmol/L (ref 98–111)
Creatinine, Ser: 0.78 mg/dL (ref 0.44–1.00)
GFR, Estimated: >60 mL/min (ref >60)
Glucose, Bld: 101 mg/dL — ABNORMAL HIGH (ref 70–99)
Potassium: 4 mmol/L (ref 3.5–5.1)
Sodium: 137 mmol/L (ref 135–145)
Total Bilirubin: 0.7 mg/dL (ref 0.3–1.2)
Total Protein: 7.4 g/dL (ref 6.5–8.1)

## 2022-03-16 LAB — CBC
HCT: 42.2 % (ref 36.0–46.0)
Hemoglobin: 13.9 g/dL (ref 12.0–15.0)
MCH: 31 pg (ref 26.0–34.0)
MCHC: 32.9 g/dL (ref 30.0–36.0)
MCV: 94.2 fL (ref 80.0–100.0)
Platelets: 304 10*3/uL (ref 150–400)
RBC: 4.48 MIL/uL (ref 3.87–5.11)
RDW: 14.8 % (ref 11.5–15.5)
WBC: 8 10*3/uL (ref 4.0–10.5)
nRBC: 0 % (ref 0.0–0.2)

## 2022-03-16 LAB — ACETAMINOPHEN LEVEL: Acetaminophen (Tylenol), Serum: <10 ug/mL — ABNORMAL LOW (ref 10–30)

## 2022-03-16 LAB — ETHANOL: Alcohol, Ethyl (B): <10 mg/dL (ref <10)

## 2022-03-16 LAB — SARS CORONAVIRUS 2 BY RT PCR: SARS Coronavirus 2 by RT PCR: NEGATIVE

## 2022-03-16 MED ORDER — LEVETIRACETAM 500 MG PO TABS
1000.0000 mg | ORAL_TABLET | Freq: Two times a day (BID) | ORAL | Status: DC
  Administered 2022-03-17: 1000 mg via ORAL
  Filled 2022-03-16 (×2): qty 2

## 2022-03-16 MED ORDER — QUETIAPINE FUMARATE 200 MG PO TABS
400.0000 mg | ORAL_TABLET | Freq: Every day | ORAL | Status: DC
  Administered 2022-03-16: 400 mg via ORAL
  Filled 2022-03-16: qty 2

## 2022-03-16 MED ORDER — HALOPERIDOL LACTATE 5 MG/ML IJ SOLN
INTRAMUSCULAR | Status: AC
  Administered 2022-03-16: 5 mg
  Filled 2022-03-16: qty 1

## 2022-03-16 MED ORDER — ACETAMINOPHEN 325 MG PO TABS
650.0000 mg | ORAL_TABLET | Freq: Four times a day (QID) | ORAL | Status: DC | PRN
  Administered 2022-03-17 – 2022-03-21 (×9): 650 mg via ORAL
  Filled 2022-03-16 (×10): qty 2

## 2022-03-16 MED ORDER — PAROXETINE HCL 20 MG PO TABS
20.0000 mg | ORAL_TABLET | Freq: Every day | ORAL | Status: DC
  Administered 2022-03-17 – 2022-03-21 (×5): 20 mg via ORAL
  Filled 2022-03-16 (×6): qty 1

## 2022-03-16 MED ORDER — CLONAZEPAM 1 MG PO TABS
1.0000 mg | ORAL_TABLET | Freq: Three times a day (TID) | ORAL | Status: DC
  Administered 2022-03-17 – 2022-03-19 (×8): 1 mg via ORAL
  Filled 2022-03-16 (×8): qty 1

## 2022-03-16 MED ORDER — LORAZEPAM 2 MG/ML IJ SOLN
INTRAMUSCULAR | Status: AC
  Administered 2022-03-16: 2 mg
  Filled 2022-03-16: qty 1

## 2022-03-16 MED ORDER — DIPHENHYDRAMINE HCL 50 MG/ML IJ SOLN
INTRAMUSCULAR | Status: AC
  Administered 2022-03-16: 50 mg
  Filled 2022-03-16: qty 1

## 2022-03-16 MED ORDER — ALUM & MAG HYDROXIDE-SIMETH 200-200-20 MG/5ML PO SUSP
30.0000 mL | ORAL | Status: DC | PRN
  Administered 2022-03-22: 30 mL via ORAL
  Filled 2022-03-16: qty 30

## 2022-03-16 MED ORDER — MAGNESIUM HYDROXIDE 400 MG/5ML PO SUSP
30.0000 mL | Freq: Every day | ORAL | Status: DC | PRN
  Administered 2022-03-18 – 2022-03-19 (×2): 30 mL via ORAL
  Filled 2022-03-16 (×2): qty 30

## 2022-03-16 NOTE — Progress Notes (Signed)
Patient presents with hyper-religiosity stating to this writer during assessment that she would sacrifice herself for God if needed. Pt tearful at times, denies SI/HI/AVH. Pt stated that she hasn't been taking her medications for a while. Pt did take her scheduled medications tonight. Pt oriented to the unit and her room and given a bible. Pt informed to let staff know if she needs anything and she stated she wants to sleep. Pt given education, support, and encouragement to be active in her treatment plan. Pt being monitored Q 15 minutes for safety per unit protocol, remains safe on the unit.

## 2022-03-16 NOTE — ED Notes (Signed)
Report given to Kimberly, RN.

## 2022-03-16 NOTE — Consult Note (Signed)
[place] Psychiatry Consult   Reason for Consult: Insomnia Referring Physician: Dr. Cherylann Banas Patient Identification: [name] MRN:  [id number] Principal Diagnosis: <principal problem not specified> Diagnosis:  Active Problems:   Altered mental status   Seizure ([place])   Intellectual disability   Undifferentiated schizophrenia ([place])   Depression   Schizoaffective disorder, bipolar type ([place])   Total Time spent with patient: 1 hour  Subjective: "I live by myself and I live with family."  [name] is a 42 y.o. female patient presented to [facility] ED via POV under involuntary commitment status (IVC). Per the ED triage nurses note, Pt here c/o insomnia. Pt has several psych issues. Pt states" Bitch please, you think it's going to be easy to kill me?" to this Probation officer. Pt not answering questions at this time but screams loudly on occasion. The patient answers questions by nodding or simply "yes, no and mumbling."  This provider saw The patient face-to-face; the chart was reviewed, and consulted with Dr. Cherylann Banas on 03/16/2022 due to the patient's care. It was discussed with the EDP that the patient does meet the criteria to be admitted to the psychiatric inpatient unit.  On evaluation, the patient is alert and oriented x 2-3, calm, cooperative, and mood-congruent with affect. The patient does not appear to be responding to internal or external stimuli. She is presenting with some delusional thinking. The patient denies auditory or visual hallucinations. The patient denies any suicidal, homicidal, or self-harm ideations. The patient did not present with psychotic behaviors during her assessment, but initially, she was and had to be medicated. The patient does not present to have paranoid behaviors, but it was reported that when she first arrived at the ED, she did have some paranoia behaviors. During an encounter with the patient, she could not answer most assessment questions  appropriately.  HPI: Per Dr. Cherylann Banas, [name] is a 41 y.o. female with a history of schizoaffective disorder, opiate abuse, seizure, migraine, and hypokalemia who presents with erratic behavior and agitation.  Her father states that she has not slept in 3-4 nights and has been behaving oddly.  The patient herself is unable to give history.  She is alternately staring at providers not saying anything, and then uttering bizarre statements.  Past Psychiatric History:  Depression Schizoaffective disorder ([place]) Seizures ([place])  Risk to Self:   Risk to Others:   Prior Inpatient Therapy:   Prior Outpatient Therapy:    Past Medical History:  Past Medical History:  Diagnosis Date   Degeneration of lumbar intervertebral disc    Depression    DVT (deep vein thrombosis) in pregnancy    Schizoaffective disorder ([place])    Seizures ([place])    Shoulder pain, right     Past Surgical History:  Procedure Laterality Date   CESAREAN SECTION     X2   Family History:  Family History  Problem Relation Age of Onset   Cancer Mother    Family Psychiatric  History: History reviewed. No pertinent family psychiatric history Social History:  Social History   Substance and Sexual Activity  Alcohol Use No     Social History   Substance and Sexual Activity  Drug Use No    Social History   Socioeconomic History   Marital status: Divorced    Spouse name: Not on file   Number of children: Not on file   Years of education: Not on file   Highest education level: Not on file  Occupational History  Occupation: unemployed  Tobacco Use   Smoking status: Every Day    Packs/day: 1.50    Types: Cigarettes   Smokeless tobacco: Never  Vaping Use   Vaping Use: Never used  Substance and Sexual Activity   Alcohol use: No   Drug use: No   Sexual activity: Not on file  Other Topics Concern   Not on file  Social History Narrative   Not on file   Social Determinants of Health    Financial Resource Strain: Unknown (11/17/2018)   Overall Financial Resource Strain (CARDIA)    Difficulty of Paying Living Expenses: Patient refused  Food Insecurity: Unknown (11/17/2018)   Hunger Vital Sign    Worried About Running Out of Food in the Last Year: Patient refused    Ran Out of Food in the Last Year: Patient refused  Transportation Needs: Unknown (11/17/2018)   PRAPARE - Transportation    Lack of Transportation (Medical): Patient refused    Lack of Transportation (Non-Medical): Patient refused  Physical Activity: Unknown (11/17/2018)   Exercise Vital Sign    Days of Exercise per Week: Patient refused    Minutes of Exercise per Session: Patient refused  Stress: Unknown (11/17/2018)   [employer] of Occupational Health - Occupational Stress Questionnaire    Feeling of Stress : Patient refused  Social Connections: Unknown (11/17/2018)   Social Connection and Isolation Panel [NHANES]    Frequency of Communication with Friends and Family: Patient refused    Frequency of Social Gatherings with Friends and Family: Patient refused    Attends Religious Services: Patient refused    Active Member of Clubs or Organizations: Patient refused    Attends Banker Meetings: Patient refused    Marital Status: Patient refused   Additional Social History:    Allergies:   Allergies  Allergen Reactions   Latex Rash and Swelling    Other reaction(s): Unknown Skin turns red   Morphine Other (See Comments) and Swelling    Patient states that her reaction is that her skin gets a bit red.   No urticaria or airway difficulty.     Prednisone Other (See Comments)    Other reaction(s): Other (See Comments) Stayed up 30 days when pt. Was on prednisone insomnia   Buprenorphine Hcl-Naloxone Hcl    Duloxetine Other (See Comments)    Unknown Unknown   Ketorolac Tromethamine    Lithium     drool   Tramadol     Other reaction(s): Other (See Comments) Seizures   Poison Ivy  Extract [Poison Ivy Extract] Rash   Poison Oak Extract [Poison Oak Extract] Rash   [place]ac Rash    Labs:  Results for orders placed or performed during the hospital encounter of 03/16/22 (from the past 48 hour(s))  Comprehensive metabolic panel     Status: Abnormal   Collection Time: 03/16/22 12:43 PM  Result Value Ref Range   Sodium 137 135 - 145 mmol/L   Potassium 4.0 3.5 - 5.1 mmol/L   Chloride 105 98 - 111 mmol/L   CO2 24 22 - 32 mmol/L   Glucose, Bld 101 (H) 70 - 99 mg/dL    Comment: Glucose reference range applies only to samples taken after fasting for at least 8 hours.   BUN 7 6 - 20 mg/dL   Creatinine, Ser 4.92 0.44 - 1.00 mg/dL   Calcium 8.6 (L) 8.9 - 10.3 mg/dL   Total Protein 7.4 6.5 - 8.1 g/dL   Albumin 3.9 3.5 - 5.0  g/dL   AST 16 15 - 41 U/L   ALT 14 0 - 44 U/L   Alkaline Phosphatase 74 38 - 126 U/L   Total Bilirubin 0.7 0.3 - 1.2 mg/dL   GFR, Estimated >60 >60 mL/min    Comment: (NOTE) Calculated using the CKD-EPI Creatinine Equation (2021)    Anion gap 8 5 - 15    Comment: Performed at [facility], [address] [place], [place] [address]  Ethanol     Status: None   Collection Time: 03/16/22 12:43 PM  Result Value Ref Range   Alcohol, Ethyl (B) <10 <10 mg/dL    Comment: (NOTE) Lowest detectable limit for serum alcohol is 10 mg/dL.  For medical purposes only. Performed at [facility], [address] [place], [place] [id number]   Salicylate level     Status: Abnormal   Collection Time: 03/16/22 12:43 PM  Result Value Ref Range   Salicylate Lvl Q000111Q (L) 7.0 - 30.0 mg/dL    Comment: Performed at [facility], [address] [place], [place] [address]  Acetaminophen level     Status: Abnormal   Collection Time: 03/16/22 12:43 PM  Result Value Ref Range   Acetaminophen (Tylenol), Serum <10 (L) 10 - 30 ug/mL    Comment: (NOTE) Therapeutic concentrations vary significantly. A range of 10-30 ug/mL  may be an effective  concentration for many patients. However, some  are best treated at concentrations outside of this range. Acetaminophen concentrations >150 ug/mL at 4 hours after ingestion  and >50 ug/mL at 12 hours after ingestion are often associated with  toxic reactions.  Performed at [facility], [address] [place], [place] [address]   cbc     Status: None   Collection Time: 03/16/22 12:43 PM  Result Value Ref Range   WBC 8.0 4.0 - 10.5 K/uL   RBC 4.48 3.87 - 5.11 MIL/uL   Hemoglobin 13.9 12.0 - 15.0 g/dL   HCT 42.2 36.0 - 46.0 %   MCV 94.2 80.0 - 100.0 fL   MCH 31.0 26.0 - 34.0 pg   MCHC 32.9 30.0 - 36.0 g/dL   RDW 14.8 11.5 - 15.5 %   Platelets 304 150 - 400 K/uL   nRBC 0.0 0.0 - 0.2 %    Comment: Performed at [facility], [address] [place], [place] [address]  SARS Coronavirus 2 by RT PCR (hospital order, performed in [facility] hospital lab) *cepheid single result test* Anterior Nasal Swab     Status: None   Collection Time: 03/16/22  7:22 PM   Specimen: Anterior Nasal Swab  Result Value Ref Range   SARS Coronavirus 2 by RT PCR NEGATIVE NEGATIVE    Comment: (NOTE) SARS-CoV-2 target nucleic acids are NOT DETECTED.  The SARS-CoV-2 RNA is generally detectable in upper and lower respiratory specimens during the acute phase of infection. The lowest concentration of SARS-CoV-2 viral copies this assay can detect is 250 copies / mL. A negative result does not preclude SARS-CoV-2 infection and should not be used as the sole basis for treatment or other patient management decisions.  A negative result may occur with improper specimen collection / handling, submission of specimen other than nasopharyngeal swab, presence of viral mutation(s) within the areas targeted by this assay, and inadequate number of viral copies (<250 copies / mL). A negative result must be combined with clinical observations, patient history, and epidemiological information.  Fact  Sheet for Patients:   https://www.patel.info/  Fact Sheet for Healthcare Providers: https://hall.com/  This test is not yet approved or  cleared by the [place] FDA and has been authorized for detection and/or diagnosis of SARS-CoV-2 by FDA under an Emergency Use Authorization (EUA).  This EUA will remain in effect (meaning this test can be used) for the duration of the COVID-19 declaration under Section 564(b)(1) of the Act, 21 U.S.C. section 360bbb-3(b)(1), unless the authorization is terminated or revoked sooner.  Performed at [facility], [address] [place], [place] [address]     No current facility-administered medications for this encounter.   Current Outpatient Medications  Medication Sig Dispense Refill   Buprenorphine HCl-Naloxone HCl 8-2 MG FILM Place 0.5 Film under the tongue 2 (two) times daily.     clonazePAM (KLONOPIN) 1 MG tablet Take 1 tablet (1 mg total) by mouth 3 (three) times daily. 24 tablet 0   Lacosamide (VIMPAT) 100 MG TABS Take 1 tablet (100 mg total) by mouth 2 (two) times daily. 60 tablet 0   levETIRAcetam (KEPPRA) 1000 MG tablet Take 1 tablet (1,000 mg total) by mouth 2 (two) times daily. 60 tablet 0   PARoxetine (PAXIL) 20 MG tablet Take 1 tablet (20 mg total) by mouth at bedtime. 30 tablet 1   QUEtiapine (SEROQUEL) 400 MG tablet Take 400-800 mg by mouth at bedtime.     nicotine (NICODERM CQ - DOSED IN MG/24 HOURS) 21 mg/24hr patch Place 1 patch (21 mg total) onto the skin daily. 28 patch 0   oxyCODONE-acetaminophen (PERCOCET/ROXICET) 5-325 MG tablet Take 1 tablet by mouth every 6 (six) hours as needed. (Patient not taking: Reported on 03/16/2022)      Musculoskeletal: Strength & Muscle Tone: within normal limits Gait & Station: normal Patient leans: N/A Psychiatric Specialty Exam:  Presentation  General Appearance: Bizarre  Eye Contact:Fleeting  Speech:Blocked  Speech  Volume:Decreased  Handedness:Right   Mood and Affect  Mood:Euphoric  Affect:Blunt; Flat; Inappropriate   Thought Process  Thought Processes:Disorganized  Descriptions of Associations:Loose  Orientation:Partial  Thought Content:Delusions  History of Schizophrenia/Schizoaffective disorder:No  Duration of Psychotic Symptoms:No data recorded Hallucinations:Hallucinations: None  Ideas of Reference:None  Suicidal Thoughts:Suicidal Thoughts: No  Homicidal Thoughts:Homicidal Thoughts: No   Sensorium  Memory:Immediate Fair; Recent Fair; Remote Fair  Judgment:Poor  Insight:Lacking   Executive Functions  Concentration:Fair  Attention Span:Fair  [place]   Psychomotor Activity  Psychomotor Activity:Psychomotor Activity: Normal   Assets  Assets:Communication Skills; Desire for Improvement; Resilience; Social Support; Physical Health   Sleep  Sleep:Sleep: Fair   Physical Exam: Physical Exam Vitals and nursing note reviewed.  Constitutional:      Appearance: Normal appearance. She is normal weight.  HENT:     Head: Normocephalic and atraumatic.     Right Ear: External ear normal.     Left Ear: External ear normal.     Nose: Nose normal.     Mouth/Throat:     Mouth: Mucous membranes are moist.  Cardiovascular:     Rate and Rhythm: Normal rate.     Pulses: Normal pulses.  Pulmonary:     Effort: Pulmonary effort is normal.  Musculoskeletal:        General: Normal range of motion.     Cervical back: Normal range of motion and neck supple.  Neurological:     General: No focal deficit present.     Mental Status: She is alert and oriented to person, place, and time.  Psychiatric:  Mood and Affect: Mood normal.        Behavior: Behavior normal.    Review of Systems  Psychiatric/Behavioral:  Positive for depression.    Blood pressure (!) 132/92, pulse 96, temperature 98.4 F (36.9 C), temperature  source Oral, resp. rate 18, height 5\' 1"  (1.549 m), weight 72 kg, SpO2 98 %. Body mass index is 29.99 kg/m.  Treatment Plan Summary: Plan Patient does meet criteria for psychiatric inpatient admission  Disposition: Recommend psychiatric Inpatient admission when medically cleared. Patient does not meet criteria for psychiatric inpatient admission. Supportive therapy provided about ongoing stressors.  Caroline Sauger, NP 03/16/2022 9:41 PM

## 2022-03-16 NOTE — ED Triage Notes (Signed)
First Nurse Note:   Pt via POV. Pt front desk, screaming to the top of her lungs. Pt continually screaming and not allowing staff to speak. Pt continues to scream "[id number]?" Family at side to attempt to calm patient down but pt continues to randomly scream out. Pt moved into a triage room.   This RN called Dr. Cherylann Banas to evaluate pt in triage. Dr. Cherylann Banas with patient at this time.

## 2022-03-16 NOTE — ED Notes (Signed)
Pt belongings:  1 neon green shirt 1 pair of pink and gray pants

## 2022-03-16 NOTE — ED Notes (Signed)

## 2022-03-16 NOTE — ED Triage Notes (Signed)
Pt here c/o insomnia. Pt has several psych issues. Pt states" Bitch please, you think it's going to be easy to kill me?" to this Probation officer. Pt not answering questions at this time but screams loudly on occasion.

## 2022-03-16 NOTE — BH Assessment (Signed)
Patient is to be admitted to [facility] by Psychiatric Nurse Practitioner Caroline Sauger.  Attending Physician will be Dr.  Weber Cooks .   Patient has been assigned to room 305, by [facility].   Intake Paper Work has been signed and placed on patient chart.  ER staff is aware of the admission: Vaughan Basta, ER Secretary   Dr. Cinda Quest, ER MD  Maudie Mercury, Patient's Nurse  Helene Kelp, Patient Access.   Pt can be transported ASAP.

## 2022-03-16 NOTE — ED Notes (Signed)
IVC/pending psych consult 

## 2022-03-16 NOTE — ED Provider Notes (Signed)
[facility] Provider Note    Event Date/Time   First MD Initiated Contact with Patient 03/16/22 1312     (approximate)   History   Insomnia   HPI  [name] is a 42 y.o. female with a history of schizoaffective disorder, opiate abuse, seizure, migraine, and hypokalemia who presents with erratic behavior and agitation.  Her father states that she has not slept in 3-4 nights and has been behaving oddly.  The patient herself is unable to give history.  She is alternately staring at providers not saying anything, and then uttering bizarre statements.    Physical Exam   Triage Vital Signs: ED Triage Vitals [03/16/22 1241]  Enc Vitals Group     BP (!) 123/92     Pulse Rate 61     Resp 18     Temp 98.5 F (36.9 C)     Temp Source Oral     SpO2 98 %     Weight 158 lb 11.7 oz (72 kg)     Height 5\' 1"  (1.549 m)     Head Circumference      Peak Flow      Pain Score 0     Pain Loc      Pain Edu?      Excl. in [place]?     Most recent vital signs: Vitals:   03/16/22 1241  BP: (!) 123/92  Pulse: 61  Resp: 18  Temp: 98.5 F (36.9 C)  SpO2: 98%     General: Alert, anxious and agitated. CV:  Good peripheral perfusion.  Resp:  Normal effort.  Abd:  No distention.  Other:  Motor intact in all extremities.  Normal gait.  Disorganized thought.   ED Results / Procedures / Treatments   Labs (all labs ordered are listed, but only abnormal results are displayed) Labs Reviewed  CBC  COMPREHENSIVE METABOLIC PANEL  ETHANOL  SALICYLATE LEVEL  ACETAMINOPHEN LEVEL  URINE DRUG SCREEN, QUALITATIVE ([place])  POC URINE PREG, ED     EKG     RADIOLOGY    PROCEDURES:  Critical Care performed: No  Procedures   MEDICATIONS ORDERED IN ED: Medications  diphenhydrAMINE (BENADRYL) 50 MG/ML injection (50 mg  Given 03/16/22 1318)  haloperidol lactate (HALDOL) 5 MG/ML injection (5 mg  Given 03/16/22 1318)  LORazepam (ATIVAN) 2 MG/ML  injection (2 mg  Given 03/16/22 1318)     IMPRESSION / MDM / ASSESSMENT AND PLAN / ED COURSE  I reviewed the triage vital signs and the nursing notes.  41 year old female with PMH as noted above presents with erratic behavior and agitation over the last several days.  She is unable to give history but is making bizarre statements and appears anxious and paranoid.  I reviewed the past medical records.  The patient was most recently admitted in July 2023.  Per the hospitalist discharge summary from 7/2 she presented with a tonic-clonic seizure and confusion.  She was loaded with Keppra and EEG showed seizure activity.  The patient was discharged after 3 days.  Previously she was admitted for seizures in April of this year.  She also has a history of catatonia.  It appears that her most recent hospital psychiatry evaluation was in 2018 and at that time presented with suicidal ideation.  Differential diagnosis includes, but is not limited to, acute psychosis, manic episode, substance-induced psychosis or mood disorder, seizure with postictal state.  At this time the patient is alert and  ambulatory with no neurologic deficits and no clinical evidence for active seizures.  Due to her agitation, paranoid and disorganized thought, the patient was uncooperative with evaluation and intermittently screaming out.  She asked "you think it's going to be easy to kill me?"  At this time the patient demonstrates acute danger to self and others.  I ordered Haldol and Ativan sedation for patient and staff safety and to facilitate evaluation.  We will obtain lab work-up for medical clearance, psychiatry and TTS consults, and reassess.  Given acute danger to self and others I have placed the patient under involuntary commitment.  Patient's presentation is most consistent with acute presentation with potential threat to life or bodily function.  The patient has been placed in psychiatric observation due to the need to  provide a safe environment for the patient while obtaining psychiatric consultation and evaluation, as well as ongoing medical and medication management to treat the patient's condition.  The patient has been placed under full IVC at this time.   ----------------------------------------- 3:06 PM on 03/16/2022 -----------------------------------------  The patient is now alert but calm.  Lab work-up is pending.  Psychiatry consult is also pending.  I signed the patient out to the oncoming ED physician Dr. Darnelle Catalan.   FINAL CLINICAL IMPRESSION(S) / ED DIAGNOSES   Final diagnoses:  Behavior concern     Rx / DC Orders   ED Discharge Orders     None        Note:  This document was prepared using Dragon voice recognition software and may include unintentional dictation errors.    Dionne Bucy, MD 03/16/22 1506
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
## 2022-03-16 NOTE — ED Notes (Signed)
Unable to collect blood work at this time.  

## 2022-03-16 NOTE — Tx Team (Signed)
Initial Treatment Plan 03/16/2022 11:39 PM [name] NVB:[id number]    PATIENT STRESSORS: Marital or family conflict   Medication change or noncompliance     PATIENT STRENGTHS: Motivation for treatment/growth  Supportive family/friends    PATIENT IDENTIFIED PROBLEMS: Insomnia   Non-med compliant                   DISCHARGE CRITERIA:  Improved stabilization in mood, thinking, and/or behavior Verbal commitment to aftercare and medication compliance  PRELIMINARY DISCHARGE PLAN: Outpatient therapy Return to previous living arrangement  PATIENT/FAMILY INVOLVEMENT: This treatment plan has been presented to and reviewed with the patient, [name]. The patient has been given the opportunity to ask questions and make suggestions.  Mallie Darting, RN 03/16/2022, 11:39 PM

## 2022-03-16 NOTE — Consult Note (Signed)
1430: Attempted to consult on this patient.  Patient is basically mute at this point.  Barely opens eyes.  She has been medicated and is unable to participate.  We will need to get consult at later time.  Sherlon Handing, PMHNP-BC

## 2022-03-16 NOTE — BH Assessment (Signed)
Patient was unable to be assessed as she was medicated due to agitation. Will try at later time.

## 2022-03-16 NOTE — Plan of Care (Signed)

## 2022-03-17 DIAGNOSIS — F1190 Opioid use, unspecified, uncomplicated: Secondary | ICD-10-CM

## 2022-03-17 DIAGNOSIS — F251 Schizoaffective disorder, depressive type: Secondary | ICD-10-CM

## 2022-03-17 LAB — HEMOGLOBIN A1C
Hgb A1c MFr Bld: 5.2 % (ref 4.8–5.6)
Mean Plasma Glucose: 102.54 mg/dL

## 2022-03-17 LAB — LIPID PANEL
Cholesterol: 282 mg/dL — ABNORMAL HIGH (ref 0–200)
HDL: 34 mg/dL — ABNORMAL LOW (ref >40)
LDL Cholesterol: 206 mg/dL — ABNORMAL HIGH (ref 0–99)
Total CHOL/HDL Ratio: 8.3 RATIO
Triglycerides: 208 mg/dL — ABNORMAL HIGH (ref <150)
VLDL: 42 mg/dL — ABNORMAL HIGH (ref 0–40)

## 2022-03-17 MED ORDER — LACOSAMIDE 50 MG PO TABS
50.0000 mg | ORAL_TABLET | Freq: Two times a day (BID) | ORAL | Status: DC
  Administered 2022-03-17 – 2022-03-22 (×10): 50 mg via ORAL
  Filled 2022-03-17 (×10): qty 1

## 2022-03-17 MED ORDER — BUPRENORPHINE HCL-NALOXONE HCL 8-2 MG SL SUBL
0.5000 | SUBLINGUAL_TABLET | Freq: Two times a day (BID) | SUBLINGUAL | Status: DC
  Administered 2022-03-18: 0.5 via SUBLINGUAL
  Filled 2022-03-17 (×2): qty 1

## 2022-03-17 MED ORDER — BUPRENORPHINE HCL-NALOXONE HCL 8-2 MG SL SUBL
1.0000 | SUBLINGUAL_TABLET | SUBLINGUAL | Status: AC
  Administered 2022-03-17: 1 via SUBLINGUAL
  Filled 2022-03-17: qty 1

## 2022-03-17 MED ORDER — NICOTINE 21 MG/24HR TD PT24
21.0000 mg | MEDICATED_PATCH | Freq: Every day | TRANSDERMAL | Status: DC
  Administered 2022-03-17 – 2022-03-22 (×6): 21 mg via TRANSDERMAL
  Filled 2022-03-17 (×6): qty 1

## 2022-03-17 MED ORDER — QUETIAPINE FUMARATE 200 MG PO TABS
800.0000 mg | ORAL_TABLET | Freq: Every day | ORAL | Status: DC
  Administered 2022-03-17 – 2022-03-21 (×5): 800 mg via ORAL
  Filled 2022-03-17 (×5): qty 4

## 2022-03-17 NOTE — H&P (Signed)
Psychiatric Admission Assessment Adult  Patient Identification: [name] MRN:  [id number] Date of Evaluation:  03/17/2022 Chief Complaint:  Schizoaffective disorder (HCC) [F25.9] Principal Diagnosis: Schizoaffective disorder (HCC) Diagnosis:  Principal Problem:   Schizoaffective disorder (HCC) Active Problems:   Seizures (HCC)   Opiate use  History of Present Illness: Patient seen and chart reviewed.  Patient known from previous encounters.  41 year old woman with history of schizoaffective disorder and a past history of opiate use disorder who was brought here by family because of concern that she had not been sleeping for several days and become more disorganized in her behavior.  On interview today the patient was calm and cooperative.  She indicated to me that she had not been sleeping for days as her chief complaint.  She did not know why that was happening.  Patient as usual was disorganized in her thinking and speech and much of her conversation is non sequiturs.  She denies suicidal or homicidal thoughts.  She claims she has been taking her medicine and is able to give an approximation of knowing what medicine she is taking.  Denies that she has been abusing any drugs.  Patient says she is not feeling right and hopes to get her medicines right but has been fully cooperative since coming downstairs.  She sees Dr. Janeece [facility]gersSu at [facility].  Associated Signs/Symptoms: Depression Symptoms:  insomnia, anxiety, Duration of Depression Symptoms: No data recorded (Hypo) Manic Symptoms:  Labiality of Mood, Anxiety Symptoms:  Excessive Worry, Psychotic Symptoms:  Paranoia, PTSD Symptoms: Negative Total Time spent with patient: 45 minutes  Past Psychiatric History: Patient has a history of schizoaffective disorder and also has a diagnosis of intellectual disability on her chart.  Has a history of opiate use disorder in the past and is maintained on low-dose Suboxone by her  outpatient psychiatric provider.  No drug screen this time but the patient denies that she has been abusing any drugs.  Does not have a past history of suicide attempts.  Is the patient at risk to self? Yes.    Has the patient been a risk to self in the past 6 months? No.  Has the patient been a risk to self within the distant past? Yes.    Is the patient a risk to others? Yes.    Has the patient been a risk to others in the past 6 months? No.  Has the patient been a risk to others within the distant past? Yes.     [place] Scale:  Flowsheet Row Admission (Current) [facility] Most recent reading at 03/16/2022 11:32 PM ED [facility] Most recent reading at 03/16/2022 12:42 PM ED to Hosp-Admission (Discharged) from [facility] 1C MEDICAL TELEMETRY Most recent reading at 12/23/2021  8:33 PM  C-SSRS RISK CATEGORY No Risk No Risk No Risk        Prior Inpatient Therapy:   Prior Outpatient Therapy:    Alcohol Screening: 1. How often do you have a drink containing alcohol?: Never 2. How many drinks containing alcohol do you have on a typical day when you are drinking?: 1 or 2 3. How often do you have six or more drinks on one occasion?: Never AUDIT-C Score: 0 4. How often during the last year have you found that you were not able to stop drinking once you had started?: Never 5. How often during the last year have  you failed to do what was normally expected from you because of drinking?: Never 6. How often during the last year have you needed a first drink in the morning to get yourself going after a heavy drinking session?: Never 7. How often during the last year have you had a feeling of guilt of remorse after drinking?: Never 8. How often during the last year have you been unable to remember what happened the night before because you had been drinking?:  Never 9. Have you or someone else been injured as a result of your drinking?: No 10. Has a relative or friend or a doctor or another health worker been concerned about your drinking or suggested you cut down?: No Alcohol Use Disorder Identification Test Final Score (AUDIT): 0 Substance Abuse History in the last 12 months:  No. Consequences of Substance Abuse: Negative Previous Psychotropic Medications: Yes  Psychological Evaluations: Yes  Past Medical History:  Past Medical History:  Diagnosis Date   Degeneration of lumbar intervertebral disc    Depression    DVT (deep vein thrombosis) in pregnancy    Schizoaffective disorder (HCC)    Seizures (HCC)    Shoulder pain, right     Past Surgical History:  Procedure Laterality Date   CESAREAN SECTION     X2   Family History:  Family History  Problem Relation Age of Onset   Cancer Mother    Family Psychiatric  History: None reported Tobacco Screening:   Social History:  Social History   Substance and Sexual Activity  Alcohol Use No     Social History   Substance and Sexual Activity  Drug Use No    Additional Social History:                           Allergies:   Allergies  Allergen Reactions   Latex Rash and Swelling    Other reaction(s): Unknown Skin turns red   Morphine Other (See Comments) and Swelling    Patient states that her reaction is that her skin gets a bit red.   No urticaria or airway difficulty.     Prednisone Other (See Comments)    Other reaction(s): Other (See Comments) Stayed up 30 days when pt. Was on prednisone insomnia   Buprenorphine Hcl-Naloxone Hcl    Duloxetine Other (See Comments)    Unknown Unknown   Ketorolac Tromethamine    Lithium     drool   Tramadol     Other reaction(s): Other (See Comments) Seizures   Poison Ivy Extract [Poison Ivy Extract] Rash   Poison Oak Extract [Poison Oak Extract] Rash   Sumac Rash   Lab Results:  Results for orders placed or performed  during the hospital encounter of 03/16/22 (from the past 48 hour(s))  Lipid panel     Status: Abnormal   Collection Time: 03/17/22 12:54 PM  Result Value Ref Range   Cholesterol 282 (H) 0 - 200 mg/dL   Triglycerides 340 (H) <150 mg/dL   HDL 34 (L) >35 mg/dL   Total CHOL/HDL Ratio 8.3 RATIO   VLDL 42 (H) 0 - 40 mg/dL   LDL Cholesterol 248 (H) 0 - 99 mg/dL    Comment:        Total Cholesterol/HDL:CHD Risk Coronary Heart Disease Risk Table                     Men   Women  1/2 Average Risk  3.4   3.3  Average Risk       5.0   4.4  2 X Average Risk   9.6   7.1  3 X Average Risk  23.4   11.0        Use the calculated Patient Ratio above and the CHD Risk Table to determine the patient's CHD Risk.        ATP III CLASSIFICATION (LDL):  <100     mg/dL   Optimal  003-704  mg/dL   Near or Above                    Optimal  130-159  mg/dL   Borderline  888-916  mg/dL   High  >945     mg/dL   Very High Performed at [facility], [address] [place], [place] [address]     Blood Alcohol level:  Lab Results  Component Value Date   [facility] <10 03/16/2022   ETH <10 02/01/2020    Metabolic Disorder Labs:  Lab Results  Component Value Date   HGBA1C 4.7 (L) 03/25/2016   MPG 88 03/25/2016   No results found for: "PROLACTIN" Lab Results  Component Value Date   CHOL 282 (H) 03/17/2022   TRIG 208 (H) 03/17/2022   HDL 34 (L) 03/17/2022   CHOLHDL 8.3 03/17/2022   VLDL 42 (H) 03/17/2022   LDLCALC 206 (H) 03/17/2022   LDLCALC 116 (H) 03/25/2016    Current Medications: Current Facility-Administered Medications  Medication Dose Route Frequency Provider Last Rate Last Admin   acetaminophen (TYLENOL) tablet 650 mg  650 mg Oral Q6H PRN Gillermo Murdoch, NP   650 mg at 03/17/22 0808   alum & mag hydroxide-simeth (MAALOX/MYLANTA) 200-200-20 MG/5ML suspension 30 mL  30 mL Oral Q4H PRN Gillermo Murdoch, NP       Melene Muller ON 03/18/2022] buprenorphine-naloxone (SUBOXONE) 8-2 mg  per SL tablet 0.5 tablet  0.5 tablet Sublingual BID Cadynce Garrette, Jackquline Denmark, MD       clonazePAM Scarlette Calico) tablet 1 mg  1 mg Oral TID Gillermo Murdoch, NP   1 mg at 03/17/22 1141   lacosamide (VIMPAT) tablet 50 mg  50 mg Oral BID Dino Borntreger, Jackquline Denmark, MD       magnesium hydroxide (MILK OF MAGNESIA) suspension 30 mL  30 mL Oral Daily PRN Gillermo Murdoch, NP       nicotine (NICODERM CQ - dosed in mg/24 hours) patch 21 mg  21 mg Transdermal Daily Suvi Archuletta T, MD   21 mg at 03/17/22 1316   PARoxetine (PAXIL) tablet 20 mg  20 mg Oral QHS Gillermo Murdoch, NP       QUEtiapine (SEROQUEL) tablet 800 mg  800 mg Oral QHS Loraine Bhullar, Jackquline Denmark, MD       PTA Medications: Medications Prior to Admission  Medication Sig Dispense Refill Last Dose   Buprenorphine HCl-Naloxone HCl 8-2 MG FILM Place 0.5 Film under the tongue 2 (two) times daily.      clonazePAM (KLONOPIN) 1 MG tablet Take 1 tablet (1 mg total) by mouth 3 (three) times daily. 24 tablet 0    Lacosamide (VIMPAT) 100 MG TABS Take 1 tablet (100 mg total) by mouth 2 (two) times daily. 60 tablet 0    levETIRAcetam (KEPPRA) 1000 MG tablet Take 1 tablet (1,000 mg total) by mouth 2 (two) times daily. 60 tablet 0    nicotine (NICODERM CQ - DOSED IN MG/24 HOURS) 21 mg/24hr patch Place 1 patch (21  mg total) onto the skin daily. 28 patch 0    oxyCODONE-acetaminophen (PERCOCET/ROXICET) 5-325 MG tablet Take 1 tablet by mouth every 6 (six) hours as needed. (Patient not taking: Reported on 03/16/2022)      PARoxetine (PAXIL) 20 MG tablet Take 1 tablet (20 mg total) by mouth at bedtime. 30 tablet 1    QUEtiapine (SEROQUEL) 400 MG tablet Take 400-800 mg by mouth at bedtime.       Musculoskeletal: Strength & Muscle Tone: within normal limits Gait & Station: normal Patient leans: N/A            Psychiatric Specialty Exam:  Presentation  General Appearance: Bizarre  Eye Contact:Fleeting  Speech:Blocked  Speech  Volume:Decreased  Handedness:Right   Mood and Affect  Mood:Euphoric  Affect:Blunt; Flat; Inappropriate   Thought Process  Thought Processes:Disorganized  Duration of Psychotic Symptoms: No data recorded Past Diagnosis of Schizophrenia or Psychoactive disorder: No  Descriptions of Associations:Loose  Orientation:Partial  Thought Content:Delusions  Hallucinations:Hallucinations: None  Ideas of Reference:None  Suicidal Thoughts:Suicidal Thoughts: No  Homicidal Thoughts:Homicidal Thoughts: No   Sensorium  Memory:Immediate Fair; Recent Fair; Remote Fair  Judgment:Poor  Insight:Lacking   Executive Functions  Concentration:Fair  Attention Span:Fair  [place]   Psychomotor Activity  Psychomotor Activity:Psychomotor Activity: Normal   Assets  Assets:Communication Skills; Desire for Improvement; Resilience; Social Support; Physical Health   Sleep  Sleep:Sleep: Fair    Physical Exam: Physical Exam Vitals and nursing note reviewed.  Constitutional:      Appearance: Normal appearance.  HENT:     Head: Normocephalic and atraumatic.     Mouth/Throat:     Pharynx: Oropharynx is clear.  Eyes:     Pupils: Pupils are equal, round, and reactive to light.  Cardiovascular:     Rate and Rhythm: Normal rate and regular rhythm.  Pulmonary:     Effort: Pulmonary effort is normal.     Breath sounds: Normal breath sounds.  Abdominal:     General: Abdomen is flat.     Palpations: Abdomen is soft.  Musculoskeletal:        General: Normal range of motion.  Skin:    General: Skin is warm and dry.  Neurological:     General: No focal deficit present.     Mental Status: She is alert. Mental status is at baseline.  Psychiatric:        Attention and Perception: She is inattentive.        Mood and Affect: Mood normal. Affect is blunt.        Speech: Speech is delayed.        Behavior: Behavior is slowed.         Thought Content: Thought content is delusional.        Cognition and Memory: Cognition is impaired.    Review of Systems  Constitutional: Negative.   HENT: Negative.    Eyes: Negative.   Respiratory: Negative.    Cardiovascular: Negative.   Gastrointestinal: Negative.   Musculoskeletal: Negative.   Skin: Negative.   Neurological: Negative.   Psychiatric/Behavioral:  Positive for depression. Negative for suicidal ideas. The patient is nervous/anxious and has insomnia.    Blood pressure 107/74, pulse (!) 111, temperature 98.3 F (36.8 C), temperature source Oral, resp. rate 17, height 5\' 1"  (1.549 m), weight 71.2 kg, SpO2 96 %. Body mass index is 29.66 kg/m.  Treatment Plan Summary: Medication management and Plan reviewed medication.  We will put her back on  her Seroquel 800 mg at night.  Patient tells me she is no longer taking Keppra and that is confirmed by the pharmacy.  She appears to only be on Vimpat for seizures.  Still getting Klonopin and Suboxone prescribed and I will continue those at her usual doses.  Patient encouraged to interact on the unit attending groups and talk with staff.  Observation Level/Precautions:  15 minute checks  Laboratory:  Chemistry Profile  Psychotherapy:    Medications:    Consultations:    Discharge Concerns:    Estimated LOS:  Other:     Physician Treatment Plan for Primary Diagnosis: Schizoaffective disorder (HCC) Long Term Goal(s): Improvement in symptoms so as ready for discharge  Short Term Goals: Ability to verbalize feelings will improve and Ability to demonstrate self-control will improve  Physician Treatment Plan for Secondary Diagnosis: Principal Problem:   Schizoaffective disorder (HCC) Active Problems:   Seizures (HCC)   Opiate use  Long Term Goal(s): Improvement in symptoms so as ready for discharge  Short Term Goals: Ability to maintain clinical measurements within normal limits will improve and Compliance with prescribed  medications will improve  I certify that inpatient services furnished can reasonably be expected to improve the patient's condition.    Mordecai Rasmussen, MD 9/21/20232:07 PM

## 2022-03-17 NOTE — Progress Notes (Signed)
Recreation Therapy Notes  Date: 03/17/2022  Time: 10:40 am   Location: Craft room    Behavioral response: N/A   Intervention Topic: Happiness   Discussion/Intervention: Patient refused to attend group.   Clinical Observations/Feedback:  Patient refused to attend group.   [name] LRT/CTRS         [name] 03/17/2022 12:51 PM 

## 2022-03-17 NOTE — [facility] Suicide Risk Assessment (Signed)
[facility] Admission Suicide Risk Assessment   Nursing information obtained from:  Patient Demographic factors:  NA Current Mental Status:  NA Loss Factors:  NA Historical Factors:  NA Risk Reduction Factors:  NA  Total Time spent with patient: 1 hour Principal Problem: Schizoaffective disorder (HCC) Diagnosis:  Principal Problem:   Schizoaffective disorder (HCC) Active Problems:   Seizures (HCC)   Opiate use  Subjective Data: Patient seen and chart reviewed.  41 year old woman with a long history of chronic mental health problems brought to the hospital under IVC because family reports that she has decompensated and not been sleeping and been showing more signs of confusion.  On interview today the patient was calm and cooperative and showed insight that she was having acute problems.  She denied any suicidal thought intent or plan.  Denied homicidal ideation.  She indicated symptoms of anxiety and depression and that she would be cooperative with treatment  Continued Clinical Symptoms:  Alcohol Use Disorder Identification Test Final Score (AUDIT): 0 The "Alcohol Use Disorders Identification Test", Guidelines for Use in Primary Care, Second Edition.  World Science writer [facility]). Score between 0-7:  no or low risk or alcohol related problems. Score between 8-15:  moderate risk of alcohol related problems. Score between 16-19:  high risk of alcohol related problems. Score 20 or above:  warrants further diagnostic evaluation for alcohol dependence and treatment.   CLINICAL FACTORS:   Schizophrenia:   Depressive state   Musculoskeletal: Strength & Muscle Tone: within normal limits Gait & Station: normal Patient leans: N/A  Psychiatric Specialty Exam:  Presentation  General Appearance: Bizarre  Eye Contact:Fleeting  Speech:Blocked  Speech Volume:Decreased  Handedness:Right   Mood and Affect  Mood:Euphoric  Affect:Blunt; Flat; Inappropriate   Thought Process   Thought Processes:Disorganized  Descriptions of Associations:Loose  Orientation:Partial  Thought Content:Delusions  History of Schizophrenia/Schizoaffective disorder:No  Duration of Psychotic Symptoms:No data recorded Hallucinations:Hallucinations: None  Ideas of Reference:None  Suicidal Thoughts:Suicidal Thoughts: No  Homicidal Thoughts:Homicidal Thoughts: No   Sensorium  Memory:Immediate Fair; Recent Fair; Remote Fair  Judgment:Poor  Insight:Lacking   Executive Functions  Concentration:Fair  Attention Span:Fair  Recall:Fair  Fund of Knowledge:Fair  Language:Fair   Psychomotor Activity  Psychomotor Activity:Psychomotor Activity: Normal   Assets  Assets:Communication Skills; Desire for Improvement; Resilience; Social Support; Physical Health   Sleep  Sleep:Sleep: Fair    Physical Exam: Physical Exam Vitals reviewed.  Constitutional:      Appearance: Normal appearance.  HENT:     Head: Normocephalic and atraumatic.     Mouth/Throat:     Pharynx: Oropharynx is clear.  Eyes:     Pupils: Pupils are equal, round, and reactive to light.  Cardiovascular:     Rate and Rhythm: Normal rate and regular rhythm.  Pulmonary:     Effort: Pulmonary effort is normal.     Breath sounds: Normal breath sounds.  Abdominal:     General: Abdomen is flat.     Palpations: Abdomen is soft.  Musculoskeletal:        General: Normal range of motion.  Skin:    General: Skin is warm and dry.  Neurological:     General: No focal deficit present.     Mental Status: She is alert. Mental status is at baseline.  Psychiatric:        Attention and Perception: Attention normal.        Mood and Affect: Mood normal. Affect is blunt.        Speech: Speech is  delayed.        Behavior: Behavior is slowed.        Thought Content: Thought content is delusional.        Cognition and Memory: Cognition is impaired.    Review of Systems  Constitutional: Negative.   HENT:  Negative.    Eyes: Negative.   Respiratory: Negative.    Cardiovascular: Negative.   Gastrointestinal: Negative.   Musculoskeletal: Negative.   Skin: Negative.   Neurological: Negative.   Psychiatric/Behavioral:  Positive for depression. Negative for substance abuse and suicidal ideas. The patient is nervous/anxious and has insomnia.    Blood pressure 107/74, pulse (!) 111, temperature 98.3 F (36.8 C), temperature source Oral, resp. rate 17, height 5\' 1"  (1.549 m), weight 71.2 kg, SpO2 96 %. Body mass index is 29.66 kg/m.   COGNITIVE FEATURES THAT CONTRIBUTE TO RISK:  Loss of executive function    SUICIDE RISK:   Minimal: No identifiable suicidal ideation.  Patients presenting with no risk factors but with morbid ruminations; may be classified as minimal risk based on the severity of the depressive symptoms  PLAN OF CARE: Continue medication.  Check with pharmacy to make sure we had correct doses.  Reviewed plan with patient.  Involvement in individual and group therapy.  Daily assessment of dangerousness prior to discharge planning  I certify that inpatient services furnished can reasonably be expected to improve the patient's condition.   Alethia Berthold, MD 03/17/2022, 2:05 PM

## 2022-03-17 NOTE — B[facility] Group Notes (Signed)
[place] Group Notes:  (Nursing/MHT/Case Management/Adjunct)  Date:  03/17/2022  Time:  9:50 AM  Type of Therapy:   Community Meeting  Participation Level:  Did Not Attend   [name] [facility] 03/17/2022, 9:50 AM

## 2022-03-17 NOTE — BHH Counselor (Signed)
CSW attempted to complete PSA with the patient.  Patient was asleep with no distress noted.  CSW observed appropriate rise and fall of patient's chest.  Assunta Curtis, MSW, LCSW 03/17/2022 10:15 AM

## 2022-03-17 NOTE — Progress Notes (Signed)
Pt denies SI/HI/AVH and verbally agrees to approach staff if these become apparent or before harming themselves/others. Rates depression 3/10. Rates anxiety 3/10. Rates pain 3/10. Pt stated, "I don't want you to think I am talking to him. I am not talking to [facility] but I used to. Just not anymore." Pt will answer some questions and she will sometimes say, "I don't know." In the afternoon, pt has been following Probation officer around the unit. Pt would sit outside of a room if the writer went into another pts room. Pt has stated to another female pt that she was so pretty and she is okay and she loves her. Pt is very disorganized and delusional. Pt is slightly confused also. Pt is cooperative but does not seem to understand the process. Pt has asked to speak with the doctor multiple times and has asked to speak with him on the phone. Pt stated that she is going to see her husband and is going to call him, which he did come to visitation. Pt has some loose associations when she talks. Pt is pleasant. Pt stated multiple times that she needed her medication when she had already gotten it. Scheduled medications administered to pt, per MD orders. RN provided support and encouragement to pt. Q15 min safety checks implemented and continued. Pt safe on the unit. RN will continue to monitor and intervene as needed.  Problem: Coping: Goal: Coping ability will improve Outcome: Progressing Goal: Will verbalize feelings Outcome: Progressing   03/17/22 0808  Psych Admission Type (Psych Patients Only)  Admission Status Involuntary  Psychosocial Assessment  Patient Complaints Anxiety;Depression  Eye Contact Fair  Facial Expression Flat  Affect Anxious;Depressed  Speech Soft;Logical/coherent  Interaction Cautious;Guarded  Motor Activity Slow  Appearance/Hygiene In scrubs  Behavior Characteristics Cooperative;Appropriate to situation;Calm  Mood Depressed;Anxious;Pleasant  Thought Process  Coherency Disorganized  Content  Delusions  Delusions None reported or observed  Perception Derealization  Hallucination None reported or observed  Judgment Poor  Confusion Mild  Danger to Self  Current suicidal ideation? Denies  Danger to Others  Danger to Others None reported or observed

## 2022-03-17 NOTE — Group Note (Signed)
[facility] LCSW Group Therapy Note   Group Date: 03/17/2022 Start Time: 1300 End Time: 1400   Type of Therapy/Topic:  Group Therapy:  Balance in Life  Participation Level:  Did Not Attend   Description of Group:    This group will address the concept of balance and how it feels and looks when one is unbalanced. Patients will be encouraged to process areas in their lives that are out of balance, and identify reasons for remaining unbalanced. Facilitators will guide patients utilizing problem- solving interventions to address and correct the stressor making their life unbalanced. Understanding and applying boundaries will be explored and addressed for obtaining  and maintaining a balanced life. Patients will be encouraged to explore ways to assertively make their unbalanced needs known to significant others in their lives, using other group members and facilitator for support and feedback.  Therapeutic Goals: Patient will identify two or more emotions or situations they have that consume much of in their lives. Patient will identify signs/triggers that life has become out of balance:  Patient will identify two ways to set boundaries in order to achieve balance in their lives:  Patient will demonstrate ability to communicate their needs through discussion and/or role plays  Summary of Patient Progress: X   Therapeutic Modalities:   Cognitive Behavioral Therapy Solution-Focused Therapy Assertiveness Training   Ailish Prospero R Jerricka Carvey, LCSW 

## 2022-03-18 DIAGNOSIS — F251 Schizoaffective disorder, depressive type: Secondary | ICD-10-CM | POA: Diagnosis not present

## 2022-03-18 MED ORDER — HALOPERIDOL 5 MG PO TABS
5.0000 mg | ORAL_TABLET | Freq: Four times a day (QID) | ORAL | Status: DC | PRN
  Administered 2022-03-18 – 2022-03-21 (×3): 5 mg via ORAL
  Filled 2022-03-18 (×3): qty 1

## 2022-03-18 MED ORDER — HALOPERIDOL LACTATE 5 MG/ML IJ SOLN: 5.0000 mg | Freq: Four times a day (QID) | INTRAMUSCULAR | Status: DC | PRN

## 2022-03-18 NOTE — Progress Notes (Signed)
Recreation Therapy Notes  INPATIENT RECREATION TR PLAN  Patient Details Name: [name] MRN: [id number] DOB: October 19, 1980 Today's Date: 03/18/2022  Rec Therapy Plan Is patient appropriate for Therapeutic Recreation?: Yes Treatment times per week: at least 3 Estimated Length of Stay: 5-7 days TR Treatment/Interventions: Group participation (Comment)  Discharge Criteria Pt will be discharged from therapy if:: Discharged Treatment plan/goals/alternatives discussed and agreed upon by:: Patient/family  Discharge Summary     [name] 03/18/2022, 12:41 PM

## 2022-03-18 NOTE — Group Note (Signed)
[facility] LCSW Group Therapy Note   Group Date: 03/18/2022 Start Time: 1300 End Time: 1400  Type of Therapy and Topic:  Group Therapy:  Feelings around Relapse and Recovery  Participation Level:  Did Not Attend    Description of Group:    Patients in this group will discuss emotions they experience before and after a relapse. They will process how experiencing these feelings, or avoidance of experiencing them, relates to having a relapse. Facilitator will guide patients to explore emotions they have related to recovery. Patients will be encouraged to process which emotions are more powerful. They will be guided to discuss the emotional reaction significant others in their lives may have to patients' relapse or recovery. Patients will be assisted in exploring ways to respond to the emotions of others without this contributing to a relapse.  Therapeutic Goals: Patient will identify two or more emotions that lead to relapse for them:  Patient will identify two emotions that result when they relapse:  Patient will identify two emotions related to recovery:  Patient will demonstrate ability to communicate their needs through discussion and/or role plays.   Summary of Patient Progress: X   Therapeutic Modalities:   Cognitive Behavioral Therapy Solution-Focused Therapy Assertiveness Training Relapse Prevention Therapy   Hamdi Kley R Angeldejesus Callaham, LCSW 

## 2022-03-18 NOTE — Progress Notes (Signed)
[facility] MD Progress Note  03/18/2022 11:24 AM [name]  MRN:  [id number] Subjective: Follow-up 41 year old woman with schizoaffective disorder.  Patient seen.  Patient attended treatment team.  Patient was slightly more organized today than yesterday although she still speaks sometimes in nonsense.  She did tell us today that she does not take Suboxone.  I pointed out to her that her doctor still prescribes it to her and it is listed as being given to her from the pharmacy and ask her what she did with it.  She did not seem to understand the question but just insisted she did not take it and did not want it here.  She was asking to go home.  Limited other interaction and her insight.  This morning nurses report she was pretty intrusive asking for more medicine Principal Problem: Schizoaffective disorder ([place]) Diagnosis: Principal Problem:   Schizoaffective disorder ([place]) Active Problems:   Seizures ([place])   Opiate use  Total Time spent with patient: 30 minutes  Past Psychiatric History: Past psychiatric history of schizoaffective disorder  Past Medical History:  Past Medical History:  Diagnosis Date   Degeneration of lumbar intervertebral disc    Depression    DVT (deep vein thrombosis) in pregnancy    Schizoaffective disorder (HCC)    Seizures ([place])    Shoulder pain, right     Past Surgical History:  Procedure Laterality Date   CESAREAN SECTION     X2   Family History:  Family History  Problem Relation Age of Onset   Cancer Mother    Family Psychiatric  History: See previous Social History:  Social History   Substance and Sexual Activity  Alcohol Use No     Social History   Substance and Sexual Activity  Drug Use No    Social History   Socioeconomic History   Marital status: Divorced    Spouse name: Not on file   Number of children: Not on file   Years of education: Not on file   Highest education level: Not on file  Occupational History   Occupation:  unemployed  Tobacco Use   Smoking status: Every Day    Packs/day: 1.50    Types: Cigarettes   Smokeless tobacco: Never  Vaping Use   Vaping Use: Never used  Substance and Sexual Activity   Alcohol use: No   Drug use: No   Sexual activity: Not on file  Other Topics Concern   Not on file  Social History Narrative   Not on file   Social Determinants of Health   Financial Resource Strain: Unknown (11/17/2018)   Overall Financial Resource Strain (CARDIA)    Difficulty of Paying Living Expenses: Patient refused  Food Insecurity: No Food Insecurity (03/16/2022)   Hunger Vital Sign    Worried About Running Out of Food in the Last Year: Never true    Ran Out of Food in the Last Year: Never true  Transportation Needs: No Transportation Needs (03/16/2022)   PRAPARE - Hydrologist (Medical): No    Lack of Transportation (Non-Medical): No  Physical Activity: Unknown (11/17/2018)   Exercise Vital Sign    Days of Exercise per Week: Patient refused    Minutes of Exercise per Session: Patient refused  Stress: Unknown (11/17/2018)   [place]    Feeling of Stress : Patient refused  Social Connections: Unknown (11/17/2018)   Social Connection and Isolation Panel [NHANES]  Frequency of Communication with Friends and Family: Patient refused    Frequency of Social Gatherings with Friends and Family: Patient refused    Attends Religious Services: Patient refused    Marine scientist or Organizations: Patient refused    Attends Music therapist: Patient refused    Marital Status: Patient refused   Additional Social History:                         Sleep: Fair  Appetite:  Fair  Current Medications: Current Facility-Administered Medications  Medication Dose Route Frequency Provider Last Rate Last Admin   acetaminophen (TYLENOL) tablet 650 mg  650 mg Oral Q6H PRN  Caroline Sauger, NP   650 mg at 03/18/22 0753   alum & mag hydroxide-simeth (MAALOX/MYLANTA) 200-200-20 MG/5ML suspension 30 mL  30 mL Oral Q4H PRN Caroline Sauger, NP       clonazePAM [name]) tablet 1 mg  1 mg Oral TID Caroline Sauger, NP   1 mg at 03/18/22 0749   haloperidol (HALDOL) tablet 5 mg  5 mg Oral Q6H PRN Raahim Shartzer, Madie Reno, MD   5 mg at 03/18/22 [id number]   Or   haloperidol lactate (HALDOL) injection 5 mg  5 mg Intramuscular Q6H PRN Yonis Carreon, Madie Reno, MD       lacosamide (VIMPAT) tablet 50 mg  50 mg Oral BID Yahye Siebert T, MD   50 mg at 03/18/22 0749   magnesium hydroxide (MILK OF MAGNESIA) suspension 30 mL  30 mL Oral Daily PRN Caroline Sauger, NP       nicotine (NICODERM CQ - dosed in mg/24 hours) patch 21 mg  21 mg Transdermal Daily Jermany Rimel, Madie Reno, MD   21 mg at 03/18/22 0749   PARoxetine (PAXIL) tablet 20 mg  20 mg Oral QHS Caroline Sauger, NP   20 mg at 03/17/22 2101   QUEtiapine (SEROQUEL) tablet 800 mg  800 mg Oral QHS Gedalia Mcmillon T, MD   800 mg at 03/17/22 2101    Lab Results:  Results for orders placed or performed during the hospital encounter of 03/16/22 (from the past 48 hour(s))  Hemoglobin A1c     Status: None   Collection Time: 03/17/22 12:54 PM  Result Value Ref Range   Hgb A1c MFr Bld 5.2 4.8 - 5.6 %    Comment: (NOTE) Pre diabetes:          5.7%-6.4%  Diabetes:              >6.4%  Glycemic control for   <7.0% adults with diabetes    Mean Plasma Glucose 102.54 mg/dL    Comment: Performed at [facility], [place] [address] [place], [place] [address]  Lipid panel     Status: Abnormal   Collection Time: 03/17/22 12:54 PM  Result Value Ref Range   Cholesterol 282 (H) 0 - 200 mg/dL   Triglycerides 208 (H) <150 mg/dL   HDL 34 (L) >40 mg/dL   Total CHOL/HDL Ratio 8.3 RATIO   VLDL 42 (H) 0 - 40 mg/dL   LDL Cholesterol 206 (H) 0 - 99 mg/dL    Comment:        Total Cholesterol/HDL:CHD Risk Coronary Heart Disease Risk Table                      Men   Women  1/2 Average Risk   3.4   3.3  Average Risk  5.0   4.4  2 X Average Risk   9.6   7.1  3 X Average Risk  23.4   11.0        Use the calculated Patient Ratio above and the CHD Risk Table to determine the patient's CHD Risk.        ATP III CLASSIFICATION (LDL):  <100     mg/dL   Optimal  100-129  mg/dL   Near or Above                    Optimal  130-159  mg/dL   Borderline  160-189  mg/dL   High  >190     mg/dL   Very High Performed at [facility], [address] [place], [place] [address]     Blood Alcohol level:  Lab Results  Component Value Date   [facility] <10 03/16/2022   ETH <10 93/81/0175    Metabolic Disorder Labs: Lab Results  Component Value Date   HGBA1C 5.2 03/17/2022   MPG 102.54 03/17/2022   MPG 88 03/25/2016   No results found for: "PROLACTIN" Lab Results  Component Value Date   CHOL 282 (H) 03/17/2022   TRIG 208 (H) 03/17/2022   HDL 34 (L) 03/17/2022   CHOLHDL 8.3 03/17/2022   VLDL 42 (H) 03/17/2022   LDLCALC 206 (H) 03/17/2022   LDLCALC 116 (H) 03/25/2016    Physical Findings: AIMS: Facial and Oral Movements Muscles of Facial Expression: None, normal Lips and Perioral Area: None, normal Jaw: None, normal Tongue: None, normal,Extremity Movements Upper (arms, wrists, hands, fingers): None, normal Lower (legs, knees, ankles, toes): None, normal, Trunk Movements Neck, shoulders, hips: None, normal, Overall Severity Severity of abnormal movements (highest score from questions above): None, normal Incapacitation due to abnormal movements: None, normal Patient's awareness of abnormal movements (rate only patient's report): No Awareness, Dental Status Current problems with teeth and/or dentures?: No Does patient usually wear dentures?: No  CIWA:    COWS:     Musculoskeletal: Strength & Muscle Tone: within normal limits Gait & Station: normal Patient leans: N/A  Psychiatric Specialty Exam:  Presentation   General Appearance: Bizarre  Eye Contact:Fleeting  Speech:Blocked  Speech Volume:Decreased  Handedness:Right   Mood and Affect  Mood:Euphoric  Affect:Blunt; Flat; Inappropriate   Thought Process  Thought Processes:Disorganized  Descriptions of Associations:Loose  Orientation:Partial  Thought Content:Delusions  History of Schizophrenia/Schizoaffective disorder:No  Duration of Psychotic Symptoms:No data recorded Hallucinations:No data recorded Ideas of Reference:None  Suicidal Thoughts:No data recorded Homicidal Thoughts:No data recorded  Sensorium  Memory:Immediate Fair; Recent Fair; Remote Fair  Judgment:Poor  Insight:Lacking   Executive Functions  Concentration:Fair  Attention Span:Fair  [place]   Psychomotor Activity  Psychomotor Activity:No data recorded  Assets  Assets:Communication Skills; Desire for Improvement; Resilience; Social Support; Physical Health   Sleep  Sleep:No data recorded   Physical Exam: Physical Exam Vitals and nursing note reviewed.  Constitutional:      Appearance: Normal appearance.  HENT:     Head: Normocephalic and atraumatic.     Mouth/Throat:     Pharynx: Oropharynx is clear.  Eyes:     Pupils: Pupils are equal, round, and reactive to light.  Cardiovascular:     Rate and Rhythm: Normal rate and regular rhythm.  Pulmonary:     Effort: Pulmonary effort is normal.     Breath sounds: Normal breath sounds.  Abdominal:     General: Abdomen is flat.  Palpations: Abdomen is soft.  Musculoskeletal:        General: Normal range of motion.  Skin:    General: Skin is warm and dry.  Neurological:     General: No focal deficit present.     Mental Status: She is alert. Mental status is at baseline.  Psychiatric:        Attention and Perception: She is inattentive.        Mood and Affect: Mood normal. Affect is blunt.        Speech: Speech is delayed and  tangential.        Behavior: Behavior is slowed.        Thought Content: Thought content normal.        Cognition and Memory: Cognition is impaired.    Review of Systems  Constitutional: Negative.   HENT: Negative.    Eyes: Negative.   Respiratory: Negative.    Cardiovascular: Negative.   Gastrointestinal: Negative.   Musculoskeletal: Negative.   Skin: Negative.   Neurological: Negative.   Psychiatric/Behavioral:  The patient is nervous/anxious.    Blood pressure (!) 120/93, pulse (!) 116, temperature 98.3 F (36.8 C), temperature source Oral, resp. rate 17, height 5\' 1"  (1.549 m), weight 71.2 kg, SpO2 97 %. Body mass index is 29.66 kg/m.   Treatment Plan Summary: Medication management and Plan attempted to call her father who is the petitioner but the number is not working.  At the patient's request I have discharge continued the Suboxone.  We will see how she does without it.  She has as needed medicines for agitation and is back on full dose of her regular psychiatric medicines.  Anticipate she should be getting better over the next couple days.  Alethia Berthold, MD 03/18/2022, 11:24 AM

## 2022-03-18 NOTE — Progress Notes (Signed)
Pleasant and active on the unit.  She denies si hi  avh.  She endorses anxiety and depression that she says is ALWAYS there.  She reports feeling better than she has in days.  She is safe on the unit with q15 minute safety checks.        C Butler-Nicholson, LPN

## 2022-03-18 NOTE — Progress Notes (Signed)
Recreation Therapy Notes    Date: 03/18/2022   Time: 10:40 am   Location: Court yard    Behavioral response: N/A   Intervention Topic: Leisure    Discussion/Intervention: Patient refused to attend group.   Clinical Observations/Feedback:  Patient refused to attend group.   [name] LRT/CTRS        [name] 03/18/2022 11:58 AM 

## 2022-03-18 NOTE — Plan of Care (Signed)
  Problem: Education: Goal: Knowledge of General Education information will improve Description: Including pain rating scale, medication(s)/side effects and non-pharmacologic comfort measures Outcome: Progressing   Problem: Nutrition: Goal: Adequate nutrition will be maintained Outcome: Progressing   Problem: Health Behavior/Discharge Planning: Goal: Compliance with prescribed medication regimen will improve Outcome: Progressing

## 2022-03-18 NOTE — Progress Notes (Signed)
Seemed to be hyper focused on her Klonipin this evening.  Was upset that she was not able to get it with her qhs medications. Seemed confused and unexcepting when told that he medication is scheduled and had already been given for the day. Patient then stated that she needs to talk to Dr. Weber Cooks.  Reminded patient of the time and  that the doctor will not be in until the morning.  Patient did take her qhs medication without incident.  She denies si  hi  avh and pain at this encounter.  She does endorse depression and anxiety but say its mild. Active on the unit between the dayroom and her room.  She appears to get along well  with her peers.  Will continue to monitor with q 15 minute safety checks.     C Butler-Nicholson, ;pN

## 2022-03-18 NOTE — Progress Notes (Signed)
Recreation Therapy Notes  INPATIENT RECREATION THERAPY ASSESSMENT  Patient Details Name: Stephana Morell MRN: [id number] DOB: [birth year] Today's Date: 03/18/2022       Information Obtained From: Patient  Able to Participate in Assessment/Interview: Yes  Patient Presentation: Responsive  Reason for Admission (Per Patient): Active Symptoms  Patient Stressors:    Coping Skills:   Building control surveyor, Avoidance  Leisure Interests (2+):  Art - Draw, Music - Listen, Individual - TV  Frequency of Recreation/Participation: Monthly  Awareness of Community Resources:  Yes  Community Resources:  [employer]  Current Use: Yes  If no, Barriers?:    Expressed Interest in [place] of Residence:  Insurance underwriter  Patient Main Form of Transportation: Other (Comment) (Family)  Patient Strengths:  good  mom  Patient Identified Areas of Improvement:  getting my teeth back  Patient Goal for Hospitalization:  To go home.  Current SI (including self-harm):  No  Current HI:  No  Current AVH: No  Staff Intervention Plan: Group Attendance, Collaborate with Interdisciplinary Treatment Team  Consent to Intern Participation: N/A  Lamar Naef 03/18/2022, 12:41 PM

## 2022-03-18 NOTE — Progress Notes (Signed)
Pt denies SI/HI/AVH and verbally agrees to approach staff if these become apparent or before harming themselves/others. Rates depression 0/10. Rates anxiety 0/10. Rates pain 10/10.  Pt will state random things. Pt stated that she heard a woman screaming last night. Pt sated that we are in different time zones. Pt states she wants to go home and does not think she has any problems to work on. Pt had been demanding her medication throughout the morning. Pt would stand at the nursing station door and barely redirectable. Pt was at med door and knocked on it a few times and then started to hit it hard and yell while writer was in the med room preparing meds. Pt has been okay and not as intrusive the rest of the day but still needy. Scheduled medications administered to pt, per MD orders. RN provided support and encouragement to pt. Q15 min safety checks implemented and continued. Pt safe on the unit. RN will continue to monitor and intervene as needed.  Problem: Activity: Goal: Will verbalize the importance of balancing activity with adequate rest periods Outcome: Not Progressing   Problem: Education: Goal: Will be free of psychotic symptoms Outcome: Not Progressing    03/18/22 0753  Psych Admission Type (Psych Patients Only)  Admission Status Involuntary  Psychosocial Assessment  Patient Complaints Anxiety;Depression  Eye Contact Fair  Facial Expression Worried;Sullen  Affect Anxious;Sad  Industrial/product designer;Intrusive;Cautious  Motor Activity Slow  Appearance/Hygiene Unremarkable;In scrubs  Behavior Characteristics Cooperative;Anxious;Restless  Mood Anxious;Preoccupied  Thought Process  Coherency Disorganized;Tangential  Content Preoccupation  Delusions None reported or observed  Perception Derealization  Hallucination None reported or observed  Judgment Poor  Confusion Mild  Danger to Self  Current suicidal ideation? Denies  Danger to Others  Danger to Others  None reported or observed

## 2022-03-18 NOTE — BH IP Treatment Plan (Signed)
Interdisciplinary Treatment and Diagnostic Plan Update  03/18/2022 Time of Session: 09:50 [name] MRN: [id number]  Principal Diagnosis: Schizoaffective disorder [facility])  Secondary Diagnoses: Principal Problem:   Schizoaffective disorder ([place]) Active Problems:   Seizures ([place])   Opiate use   Current Medications:  Current Facility-Administered Medications  Medication Dose Route Frequency Provider Last Rate Last Admin   acetaminophen (TYLENOL) tablet 650 mg  650 mg Oral Q6H PRN Caroline Sauger, NP   650 mg at 03/18/22 0753   alum & mag hydroxide-simeth (MAALOX/MYLANTA) 200-200-20 MG/5ML suspension 30 mL  30 mL Oral Q4H PRN Caroline Sauger, NP       clonazePAM [name]) tablet 1 mg  1 mg Oral TID Caroline Sauger, NP   1 mg at 03/18/22 1151   haloperidol (HALDOL) tablet 5 mg  5 mg Oral Q6H PRN Clapacs, Madie Reno, MD   5 mg at 03/18/22 0349   Or   haloperidol lactate (HALDOL) injection 5 mg  5 mg Intramuscular Q6H PRN Clapacs, Madie Reno, MD       lacosamide (VIMPAT) tablet 50 mg  50 mg Oral BID Clapacs, John T, MD   50 mg at 03/18/22 0749   magnesium hydroxide (MILK OF MAGNESIA) suspension 30 mL  30 mL Oral Daily PRN Caroline Sauger, NP       nicotine (NICODERM CQ - dosed in mg/24 hours) patch 21 mg  21 mg Transdermal Daily Clapacs, Madie Reno, MD   21 mg at 03/18/22 0749   PARoxetine (PAXIL) tablet 20 mg  20 mg Oral QHS Caroline Sauger, NP   20 mg at 03/17/22 2101   QUEtiapine (SEROQUEL) tablet 800 mg  800 mg Oral QHS Clapacs, John T, MD   800 mg at 03/17/22 2101   PTA Medications: Medications Prior to Admission  Medication Sig Dispense Refill Last Dose   Buprenorphine HCl-Naloxone HCl 8-2 MG FILM Place 0.5 Film under the tongue 2 (two) times daily.      clonazePAM (KLONOPIN) 1 MG tablet Take 1 tablet (1 mg total) by mouth 3 (three) times daily. 24 tablet 0    Lacosamide (VIMPAT) 100 MG TABS Take 1 tablet (100 mg total) by mouth 2 (two) times daily. 60 tablet  0    levETIRAcetam (KEPPRA) 1000 MG tablet Take 1 tablet (1,000 mg total) by mouth 2 (two) times daily. 60 tablet 0    nicotine (NICODERM CQ - DOSED IN MG/24 HOURS) 21 mg/24hr patch Place 1 patch (21 mg total) onto the skin daily. 28 patch 0    oxyCODONE-acetaminophen (PERCOCET/ROXICET) 5-325 MG tablet Take 1 tablet by mouth every 6 (six) hours as needed. (Patient not taking: Reported on 03/16/2022)      PARoxetine (PAXIL) 20 MG tablet Take 1 tablet (20 mg total) by mouth at bedtime. 30 tablet 1    QUEtiapine (SEROQUEL) 400 MG tablet Take 400-800 mg by mouth at bedtime.       Patient Stressors: Marital or family conflict   Medication change or noncompliance    Patient Strengths: Motivation for treatment/growth  Supportive family/friends   Treatment Modalities: Medication Management, Group therapy, Case management,  1 to 1 session with clinician, Psychoeducation, Recreational therapy.   Physician Treatment Plan for Primary Diagnosis: Schizoaffective disorder ([place]) Long Term Goal(s): Improvement in symptoms so as ready for discharge   Short Term Goals: Ability to maintain clinical measurements within normal limits will improve Compliance with prescribed medications will improve Ability to verbalize feelings will improve Ability to demonstrate self-control will improve  Medication Management: Evaluate patient's response, side effects, and tolerance of medication regimen.  Therapeutic Interventions: 1 to 1 sessions, Unit Group sessions and Medication administration.  Evaluation of Outcomes: Not Met  Physician Treatment Plan for Secondary Diagnosis: Principal Problem:   Schizoaffective disorder ([place]) Active Problems:   Seizures ([place])   Opiate use  Long Term Goal(s): Improvement in symptoms so as ready for discharge   Short Term Goals: Ability to maintain clinical measurements within normal limits will improve Compliance with prescribed medications will improve Ability to verbalize  feelings will improve Ability to demonstrate self-control will improve     Medication Management: Evaluate patient's response, side effects, and tolerance of medication regimen.  Therapeutic Interventions: 1 to 1 sessions, Unit Group sessions and Medication administration.  Evaluation of Outcomes: Not Met   RN Treatment Plan for Primary Diagnosis: Schizoaffective disorder ([place]) Long Term Goal(s): Knowledge of disease and therapeutic regimen to maintain health will improve  Short Term Goals: Ability to remain free from injury will improve, Ability to verbalize frustration and anger appropriately will improve, Ability to demonstrate self-control, Ability to participate in decision making will improve, Ability to verbalize feelings will improve, Ability to disclose and discuss suicidal ideas, Ability to identify and develop effective coping behaviors will improve, and Compliance with prescribed medications will improve  Medication Management: RN will administer medications as ordered by provider, will assess and evaluate patient's response and provide education to patient for prescribed medication. RN will report any adverse and/or side effects to prescribing provider.  Therapeutic Interventions: 1 on 1 counseling sessions, Psychoeducation, Medication administration, Evaluate responses to treatment, Monitor vital signs and CBGs as ordered, Perform/monitor CIWA, COWS, AIMS and Fall Risk screenings as ordered, Perform wound care treatments as ordered.  Evaluation of Outcomes: Not Met   LCSW Treatment Plan for Primary Diagnosis: Schizoaffective disorder ([place]) Long Term Goal(s): Safe transition to appropriate next level of care at discharge, Engage patient in therapeutic group addressing interpersonal concerns.  Short Term Goals: Engage patient in aftercare planning with referrals and resources, Increase social support, Increase ability to appropriately verbalize feelings, Increase emotional  regulation, Facilitate acceptance of mental health diagnosis and concerns, Facilitate patient progression through stages of change regarding substance use diagnoses and concerns, Identify triggers associated with mental health/substance abuse issues, and Increase skills for wellness and recovery  Therapeutic Interventions: Assess for all discharge needs, 1 to 1 time with Social worker, Explore available resources and support systems, Assess for adequacy in community support network, Educate family and significant other(s) on suicide prevention, Complete Psychosocial Assessment, Interpersonal group therapy.  Evaluation of Outcomes: Not Met   Progress in Treatment: Attending groups: No. Participating in groups: No. Taking medication as prescribed: Yes. Toleration medication: Yes. Family/Significant other contact made: No, will contact:  if given permission. Patient understands diagnosis: Yes. Discussing patient identified problems/goals with staff: Yes. Medical problems stabilized or resolved: Yes. Denies suicidal/homicidal ideation: Yes. Issues/concerns per patient self-inventory: No. Other: none.  New problem(s) identified: No, Describe:  none identified.  New Short Term/Long Term Goal(s): detox, elimination of symptoms of psychosis, medication management for mood stabilization; elimination of SI thoughts; development of comprehensive mental wellness/sobriety plan.  Patient Goals:  "I want to go home."  Discharge Plan or Barriers: CSW will assist pt with development of an appropriate aftercare/discharge plan.  Reason for Continuation of Hospitalization: Anxiety Medication stabilization  Estimated Length of Stay: 1-7 days  Last 3 [place] Suicide Severity Risk Score: Flowsheet Row Admission (Current) from 03/16/2022 in [facility]  MEDICINE Most recent reading at 03/16/2022 11:32 PM ED from 03/16/2022 in [facility] Most recent  reading at 03/16/2022 12:42 PM ED to Hosp-Admission (Discharged) from 12/23/2021 in [facility] Most recent reading at 12/23/2021  8:33 PM  C-SSRS RISK CATEGORY No Risk No Risk No Risk       Last PHQ 2/9 Scores:     No data to display          Scribe for Treatment Team: Shirl Harris, Marlinda Mike 03/18/2022 2:41 PM

## 2022-03-19 DIAGNOSIS — F251 Schizoaffective disorder, depressive type: Secondary | ICD-10-CM | POA: Diagnosis not present

## 2022-03-19 MED ORDER — POLYETHYLENE GLYCOL 3350 17 G PO PACK
17.0000 g | PACK | Freq: Every day | ORAL | Status: DC
  Administered 2022-03-19 – 2022-03-22 (×4): 17 g via ORAL
  Filled 2022-03-19 (×4): qty 1

## 2022-03-19 MED ORDER — NICOTINE POLACRILEX 2 MG MT GUM: 2.0000 mg | CHEWING_GUM | OROMUCOSAL | Status: DC | PRN

## 2022-03-19 MED ORDER — CLONAZEPAM 1 MG PO TABS
1.0000 mg | ORAL_TABLET | Freq: Three times a day (TID) | ORAL | Status: DC
  Administered 2022-03-19 – 2022-03-22 (×8): 1 mg via ORAL
  Filled 2022-03-19 (×8): qty 1

## 2022-03-19 MED ORDER — SENNA 8.6 MG PO TABS: 2.0000 | ORAL_TABLET | Freq: Every day | ORAL | Status: DC | PRN

## 2022-03-19 NOTE — Progress Notes (Signed)
Patient alert with periods of confusion to time ans situation denies SI/HI/AVH interacting appropriately with peers and staff.

## 2022-03-19 NOTE — BHH Group Notes (Signed)
Patients were given education on positive reframing and how positive mindset can impact mood. Patients were given a poem to read as well and discussed how anxiety can impact thinking. Pt attended and attentive but thoughts are disorganized and off topic.

## 2022-03-19 NOTE — Progress Notes (Signed)
[facility] MD Progress Note  03/19/2022 12:41 PM [name]  MRN:  [id number] Subjective: Follow-up 41 year old woman with schizoaffective disorder.  Patient had made several complaints to nurses this morning including being constipated and wanting to change to nicotine gum and wanting to change her Klonopin dose to nighttime.  Patient woke up and spoke briefly with me but did not make much sense.  Not aggressive not threatening not suicidal.  Mostly calm and cooperative. Principal Problem: Schizoaffective disorder (HCC) Diagnosis: Principal Problem:   Schizoaffective disorder (HCC) Active Problems:   Seizures (HCC)   Opiate use  Total Time spent with patient: 30 minutes  Past Psychiatric History: Past history of schizoaffective disorder  Past Medical History:  Past Medical History:  Diagnosis Date   Degeneration of lumbar intervertebral disc    Depression    DVT (deep vein thrombosis) in pregnancy    Schizoaffective disorder (HCC)    Seizures (HCC)    Shoulder pain, right     Past Surgical History:  Procedure Laterality Date   CESAREAN SECTION     X2   Family History:  Family History  Problem Relation Age of Onset   Cancer Mother    Family Psychiatric  History: See previous Social History:  Social History   Substance and Sexual Activity  Alcohol Use No     Social History   Substance and Sexual Activity  Drug Use No    Social History   Socioeconomic History   Marital status: Divorced    Spouse name: Not on file   Number of children: Not on file   Years of education: Not on file   Highest education level: Not on file  Occupational History   Occupation: unemployed  Tobacco Use   Smoking status: Every Day    Packs/day: 1.50    Types: Cigarettes   Smokeless tobacco: Never  Vaping Use   Vaping Use: Never used  Substance and Sexual Activity   Alcohol use: No   Drug use: No   Sexual activity: Not on file  Other Topics Concern   Not on file  Social  History Narrative   Not on file   Social Determinants of Health   Financial Resource Strain: Unknown (11/17/2018)   Overall Financial Resource Strain (CARDIA)    Difficulty of Paying Living Expenses: Patient refused  Food Insecurity: No Food Insecurity (03/16/2022)   Hunger Vital Sign    Worried About Running Out of Food in the Last Year: Never true    Ran Out of Food in the Last Year: Never true  Transportation Needs: No Transportation Needs (03/16/2022)   PRAPARE - Administrator, Civil Service (Medical): No    Lack of Transportation (Non-Medical): No  Physical Activity: Unknown (11/17/2018)   Exercise Vital Sign    Days of Exercise per Week: Patient refused    Minutes of Exercise per Session: Patient refused  Stress: Unknown (11/17/2018)   [employer] of Occupational Health - Occupational Stress Questionnaire    Feeling of Stress : Patient refused  Social Connections: Unknown (11/17/2018)   Social Connection and Isolation Panel [NHANES]    Frequency of Communication with Friends and Family: Patient refused    Frequency of Social Gatherings with Friends and Family: Patient refused    Attends Religious Services: Patient refused    Active Member of Clubs or Organizations: Patient refused    Attends Banker Meetings: Patient refused    Marital Status: Patient refused   Additional Social  History:                         Sleep: Fair  Appetite:  Fair  Current Medications: Current Facility-Administered Medications  Medication Dose Route Frequency Provider Last Rate Last Admin   acetaminophen (TYLENOL) tablet 650 mg  650 mg Oral Q6H PRN Gillermo Murdoch, NP   650 mg at 03/18/22 1742   alum & mag hydroxide-simeth (MAALOX/MYLANTA) 200-200-20 MG/5ML suspension 30 mL  30 mL Oral Q4H PRN Gillermo Murdoch, NP       clonazePAM Scarlette Calico) tablet 1 mg  1 mg Oral TID Velinda Wrobel, Jackquline Denmark, MD       haloperidol (HALDOL) tablet 5 mg  5 mg Oral Q6H PRN  Shivon Hackel, Jackquline Denmark, MD   5 mg at 03/18/22 2058   Or   haloperidol lactate (HALDOL) injection 5 mg  5 mg Intramuscular Q6H PRN Senaya Dicenso, Jackquline Denmark, MD       lacosamide (VIMPAT) tablet 50 mg  50 mg Oral BID Elianna Windom T, MD   50 mg at 03/19/22 0801   magnesium hydroxide (MILK OF MAGNESIA) suspension 30 mL  30 mL Oral Daily PRN Gillermo Murdoch, NP   30 mL at 03/19/22 1023   nicotine (NICODERM CQ - dosed in mg/24 hours) patch 21 mg  21 mg Transdermal Daily Lakethia Coppess, Jackquline Denmark, MD   21 mg at 03/19/22 0801   nicotine polacrilex (NICORETTE) gum 2 mg  2 mg Oral PRN Saraiya Kozma, Jackquline Denmark, MD       PARoxetine (PAXIL) tablet 20 mg  20 mg Oral QHS Gillermo Murdoch, NP   20 mg at 03/18/22 2057   polyethylene glycol (MIRALAX / GLYCOLAX) packet 17 g  17 g Oral Daily Liliana Dang T, MD       QUEtiapine (SEROQUEL) tablet 800 mg  800 mg Oral QHS Deniah Saia T, MD   800 mg at 03/18/22 2057   senna (SENOKOT) tablet 17.2 mg  2 tablet Oral Daily PRN Rigby Swamy, Jackquline Denmark, MD        Lab Results:  Results for orders placed or performed during the hospital encounter of 03/16/22 (from the past 48 hour(s))  Hemoglobin A1c     Status: None   Collection Time: 03/17/22 12:54 PM  Result Value Ref Range   Hgb A1c MFr Bld 5.2 4.8 - 5.6 %    Comment: (NOTE) Pre diabetes:          5.7%-6.4%  Diabetes:              >6.4%  Glycemic control for   <7.0% adults with diabetes    Mean Plasma Glucose 102.54 mg/dL    Comment: Performed at [facility], [address], [place], [place] [address]  Lipid panel     Status: Abnormal   Collection Time: 03/17/22 12:54 PM  Result Value Ref Range   Cholesterol 282 (H) 0 - 200 mg/dL   Triglycerides 170 (H) <150 mg/dL   HDL 34 (L) >01 mg/dL   Total CHOL/HDL Ratio 8.3 RATIO   VLDL 42 (H) 0 - 40 mg/dL   LDL Cholesterol 749 (H) 0 - 99 mg/dL    Comment:        Total Cholesterol/HDL:CHD Risk Coronary Heart Disease Risk Table                     Men   Women  1/2 Average Risk   3.4    3.3  Average Risk       5.0   4.4  2 X Average Risk   9.6   7.1  3 X Average Risk  23.4   11.0        Use the calculated Patient Ratio above and the CHD Risk Table to determine the patient's CHD Risk.        ATP III CLASSIFICATION (LDL):  <100     mg/dL   Optimal  884-166  mg/dL   Near or Above                    Optimal  130-159  mg/dL   Borderline  063-016  mg/dL   High  >010     mg/dL   Very High Performed at [facility], [address] [place], [place] [address]     Blood Alcohol level:  Lab Results  Component Value Date   [facility] <10 03/16/2022   ETH <10 02/01/2020    Metabolic Disorder Labs: Lab Results  Component Value Date   HGBA1C 5.2 03/17/2022   MPG 102.54 03/17/2022   MPG 88 03/25/2016   No results found for: "PROLACTIN" Lab Results  Component Value Date   CHOL 282 (H) 03/17/2022   TRIG 208 (H) 03/17/2022   HDL 34 (L) 03/17/2022   CHOLHDL 8.3 03/17/2022   VLDL 42 (H) 03/17/2022   LDLCALC 206 (H) 03/17/2022   LDLCALC 116 (H) 03/25/2016    Physical Findings: AIMS: Facial and Oral Movements Muscles of Facial Expression: None, normal Lips and Perioral Area: None, normal Jaw: None, normal Tongue: None, normal,Extremity Movements Upper (arms, wrists, hands, fingers): None, normal Lower (legs, knees, ankles, toes): None, normal, Trunk Movements Neck, shoulders, hips: None, normal, Overall Severity Severity of abnormal movements (highest score from questions above): None, normal Incapacitation due to abnormal movements: None, normal Patient's awareness of abnormal movements (rate only patient's report): No Awareness, Dental Status Current problems with teeth and/or dentures?: No Does patient usually wear dentures?: No  CIWA:    COWS:     Musculoskeletal: Strength & Muscle Tone: within normal limits Gait & Station: normal Patient leans: N/A  Psychiatric Specialty Exam:  Presentation  General Appearance: Bizarre  Eye  Contact:Fleeting  Speech:Blocked  Speech Volume:Decreased  Handedness:Right   Mood and Affect  Mood:Euphoric  Affect:Blunt; Flat; Inappropriate   Thought Process  Thought Processes:Disorganized  Descriptions of Associations:Loose  Orientation:Partial  Thought Content:Delusions  History of Schizophrenia/Schizoaffective disorder:No  Duration of Psychotic Symptoms:No data recorded Hallucinations:No data recorded Ideas of Reference:None  Suicidal Thoughts:No data recorded Homicidal Thoughts:No data recorded  Sensorium  Memory:Immediate Fair; Recent Fair; Remote Fair  Judgment:Poor  Insight:Lacking   Executive Functions  Concentration:Fair  Attention Span:Fair  Recall:Fair  Fund of Knowledge:Fair  Language:Fair   Psychomotor Activity  Psychomotor Activity:No data recorded  Assets  Assets:Communication Skills; Desire for Improvement; Resilience; Social Support; Physical Health   Sleep  Sleep:No data recorded   Physical Exam: Physical Exam Vitals and nursing note reviewed.  Constitutional:      Appearance: Normal appearance.  HENT:     Head: Normocephalic and atraumatic.     Mouth/Throat:     Pharynx: Oropharynx is clear.  Eyes:     Pupils: Pupils are equal, round, and reactive to light.  Cardiovascular:     Rate and Rhythm: Normal rate and regular rhythm.  Pulmonary:     Effort: Pulmonary effort is normal.     Breath sounds: Normal breath sounds.  Abdominal:  General: Abdomen is flat.     Palpations: Abdomen is soft.  Musculoskeletal:        General: Normal range of motion.  Skin:    General: Skin is warm and dry.  Neurological:     General: No focal deficit present.     Mental Status: She is alert. Mental status is at baseline.  Psychiatric:        Attention and Perception: She is inattentive.        Mood and Affect: Mood normal. Affect is blunt.        Speech: Speech is delayed.        Behavior: Behavior is slowed.         Thought Content: Thought content normal.        Cognition and Memory: Cognition is impaired.    Review of Systems  Constitutional: Negative.   HENT: Negative.    Eyes: Negative.   Respiratory: Negative.    Cardiovascular: Negative.   Gastrointestinal:  Positive for constipation.  Musculoskeletal: Negative.   Skin: Negative.   Neurological: Negative.   Psychiatric/Behavioral: Negative.     Blood pressure 102/83, pulse (!) 127, temperature 98.6 F (37 C), temperature source Oral, resp. rate 17, height 5\' 1"  (1.549 m), weight 71.2 kg, SpO2 96 %. Body mass index is 29.66 kg/m.   Treatment Plan Summary: Medication management and Plan change Klonopin dose to nighttime and mid day dose to 2:00.  Added nicotine gum.  Added senna and MiraLAX.  Encourage group attendance.  Alethia Berthold, MD 03/19/2022, 12:41 PM

## 2022-03-19 NOTE — BHH Group Notes (Signed)
[facility] Group Notes:  (Nursing/MHT/Case Management/Adjunct)  Date:  03/19/2022  Time:  2:44 PM  Type of Therapy:  Group Therapy  Participation Level:  Minimal  Participation Quality:  Appropriate, Intrusive, Inattentive, Redirectable, and Sharing  Affect:  Appropriate and Flat  Cognitive:  Alert, Disorganized, and Oriented  Insight:  Improving  Engagement in Group:  Limited  Modes of Intervention:  Discussion and Education  Summary of Progress/Problems: Pt says things that do not make sense and/or pertain to conversation /topic. Pt does share and is engaged in conversation. Pt does share personal insight.   Maglione,English Tomer E 03/19/2022, 2:44 PM

## 2022-03-19 NOTE — Progress Notes (Signed)
Pt was assessed in her room. She rested for most of the morning, denied SI/HI/AVH or self harm thoughts but endorsing anxiety and feelings of constipation. She was med compliant, was administered PRN MOM without any results. Dr. Weber Cooks saw patient and new order of miralax given still pending results. She is eating 100% of her meals, snacks and hydrating well. She attended groups with her peers and was observed interacting with her peers appropriately without any issues. She ambulates with a slow steady gait with no falls or unsafe behavior noted thus far. Q15 min observations maintained for safety and support provided as needed.    03/19/22 1000  Psych Admission Type (Psych Patients Only)  Admission Status Involuntary  Psychosocial Assessment  Patient Complaints Anxiety (Constipation)  Eye Contact Fair  Facial Expression Flat  Affect Depressed;Sad  Speech Logical/coherent;Soft  Interaction Cautious  Motor Activity Slow  Appearance/Hygiene In scrubs  Behavior Characteristics Cooperative  Mood Preoccupied  Thought Process  Coherency Circumstantial;Disorganized  Content Preoccupation  Delusions None reported or observed  Perception WDL  Hallucination None reported or observed  Judgment Poor  Confusion Mild  Danger to Self  Current suicidal ideation? Denies  Danger to Others  Danger to Others None reported or observed

## 2022-03-20 DIAGNOSIS — F251 Schizoaffective disorder, depressive type: Secondary | ICD-10-CM | POA: Diagnosis not present

## 2022-03-20 LAB — URINE DRUG SCREEN, QUALITATIVE (ARMC ONLY)
Amphetamines, Ur Screen: NOT DETECTED
Barbiturates, Ur Screen: NOT DETECTED
Benzodiazepine, Ur Scrn: POSITIVE — AB
Cannabinoid 50 Ng, Ur ~~LOC~~: POSITIVE — AB
Cocaine Metabolite,Ur ~~LOC~~: NOT DETECTED
MDMA (Ecstasy)Ur Screen: NOT DETECTED
Methadone Scn, Ur: NOT DETECTED
Opiate, Ur Screen: NOT DETECTED
Phencyclidine (PCP) Ur S: NOT DETECTED
Tricyclic, Ur Screen: POSITIVE — AB

## 2022-03-20 MED ORDER — IBUPROFEN 600 MG PO TABS
600.0000 mg | ORAL_TABLET | Freq: Four times a day (QID) | ORAL | Status: DC | PRN
  Administered 2022-03-20 – 2022-03-21 (×3): 600 mg via ORAL
  Filled 2022-03-20 (×3): qty 1
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
## 2022-03-20 NOTE — Progress Notes (Signed)
[facility] MD Progress Note  03/20/2022 11:34 AM [name]  MRN:  [id number] Subjective: Patient seen and chart reviewed.  Patient still not making much sense also complaining of pain today.  Woke up saying she had pain in her joints.  Asking for more pain medicine.  Eating okay.  No aggressive behavior Principal Problem: Schizoaffective disorder ([place]) Diagnosis: Principal Problem:   Schizoaffective disorder ([place]) Active Problems:   Seizures ([place])   Opiate use  Total Time spent with patient: 30 minutes  Past Psychiatric History: Past history of schizoaffective disorder and substance use  Past Medical History:  Past Medical History:  Diagnosis Date   Degeneration of lumbar intervertebral disc    Depression    DVT (deep vein thrombosis) in pregnancy    Schizoaffective disorder (HCC)    Seizures (HCC)    Shoulder pain, right     Past Surgical History:  Procedure Laterality Date   CESAREAN SECTION     X2   Family History:  Family History  Problem Relation Age of Onset   Cancer Mother    Family Psychiatric  History: See previous Social History:  Social History   Substance and Sexual Activity  Alcohol Use No     Social History   Substance and Sexual Activity  Drug Use No    Social History   Socioeconomic History   Marital status: Divorced    Spouse name: Not on file   Number of children: Not on file   Years of education: Not on file   Highest education level: Not on file  Occupational History   Occupation: unemployed  Tobacco Use   Smoking status: Every Day    Packs/day: 1.50    Types: Cigarettes   Smokeless tobacco: Never  Vaping Use   Vaping Use: Never used  Substance and Sexual Activity   Alcohol use: No   Drug use: No   Sexual activity: Not on file  Other Topics Concern   Not on file  Social History Narrative   Not on file   Social Determinants of Health   Financial Resource Strain: Unknown (11/17/2018)   Overall Financial Resource Strain  (CARDIA)    Difficulty of Paying Living Expenses: Patient refused  Food Insecurity: No Food Insecurity (03/16/2022)   Hunger Vital Sign    Worried About Running Out of Food in the Last Year: Never true    Ran Out of Food in the Last Year: Never true  Transportation Needs: No Transportation Needs (03/16/2022)   PRAPARE - Hydrologist (Medical): No    Lack of Transportation (Non-Medical): No  Physical Activity: Unknown (11/17/2018)   Exercise Vital Sign    Days of Exercise per Week: Patient refused    Minutes of Exercise per Session: Patient refused  Stress: Unknown (11/17/2018)   [place]    Feeling of Stress : Patient refused  Social Connections: Unknown (11/17/2018)   Social Connection and Isolation Panel [NHANES]    Frequency of Communication with Friends and Family: Patient refused    Frequency of Social Gatherings with Friends and Family: Patient refused    Attends Religious Services: Patient refused    Active Member of Clubs or Organizations: Patient refused    Attends Archivist Meetings: Patient refused    Marital Status: Patient refused   Additional Social History:  Sleep: Fair  Appetite:  Fair  Current Medications: Current Facility-Administered Medications  Medication Dose Route Frequency Provider Last Rate Last Admin   acetaminophen (TYLENOL) tablet 650 mg  650 mg Oral Q6H PRN Caroline Sauger, NP   650 mg at 03/20/22 0454   alum & mag hydroxide-simeth (MAALOX/MYLANTA) 200-200-20 MG/5ML suspension 30 mL  30 mL Oral Q4H PRN Caroline Sauger, NP       clonazePAM [name]) tablet 1 mg  1 mg Oral TID Bobbye Reinitz, Madie Reno, MD   1 mg at 03/20/22 0731   haloperidol (HALDOL) tablet 5 mg  5 mg Oral Q6H PRN Vaniya Augspurger, Madie Reno, MD   5 mg at 03/18/22 2058   Or   haloperidol lactate (HALDOL) injection 5 mg  5 mg Intramuscular Q6H PRN Caly Pellum,  Ricky Gallery T, MD       ibuprofen (ADVIL) tablet 600 mg  600 mg Oral Q6H PRN Yarieliz Wasser, Madie Reno, MD       lacosamide (VIMPAT) tablet 50 mg  50 mg Oral BID Ronnel Zuercher T, MD   50 mg at 03/20/22 0731   magnesium hydroxide (MILK OF MAGNESIA) suspension 30 mL  30 mL Oral Daily PRN Caroline Sauger, NP   30 mL at 03/19/22 1023   nicotine (NICODERM CQ - dosed in mg/24 hours) patch 21 mg  21 mg Transdermal Daily Saskia Simerson, Madie Reno, MD   21 mg at 03/20/22 0981   nicotine polacrilex (NICORETTE) gum 2 mg  2 mg Oral PRN Kaziah Krizek T, MD       PARoxetine (PAXIL) tablet 20 mg  20 mg Oral QHS Caroline Sauger, NP   20 mg at 03/19/22 2104   polyethylene glycol (MIRALAX / GLYCOLAX) packet 17 g  17 g Oral Daily Dayden Viverette, Madie Reno, MD   17 g at 03/20/22 0731   QUEtiapine (SEROQUEL) tablet 800 mg  800 mg Oral QHS Nasira Janusz T, MD   800 mg at 03/19/22 2104   senna (SENOKOT) tablet 17.2 mg  2 tablet Oral Daily PRN Pearlee Arvizu, Madie Reno, MD        Lab Results: No results found for this or any previous visit (from the past 48 hour(s)).  Blood Alcohol level:  Lab Results  Component Value Date   ETH <10 03/16/2022   ETH <10 19/14/7829    Metabolic Disorder Labs: Lab Results  Component Value Date   HGBA1C 5.2 03/17/2022   MPG 102.54 03/17/2022   MPG 88 03/25/2016   No results found for: "PROLACTIN" Lab Results  Component Value Date   CHOL 282 (H) 03/17/2022   TRIG 208 (H) 03/17/2022   HDL 34 (L) 03/17/2022   CHOLHDL 8.3 03/17/2022   VLDL 42 (H) 03/17/2022   LDLCALC 206 (H) 03/17/2022   LDLCALC 116 (H) 03/25/2016    Physical Findings: AIMS: Facial and Oral Movements Muscles of Facial Expression: None, normal Lips and Perioral Area: None, normal Jaw: None, normal Tongue: None, normal,Extremity Movements Upper (arms, wrists, hands, fingers): None, normal Lower (legs, knees, ankles, toes): None, normal, Trunk Movements Neck, shoulders, hips: None, normal, Overall Severity Severity of abnormal movements  (highest score from questions above): None, normal Incapacitation due to abnormal movements: None, normal Patient's awareness of abnormal movements (rate only patient's report): No Awareness, Dental Status Current problems with teeth and/or dentures?: No Does patient usually wear dentures?: No  CIWA:    COWS:     Musculoskeletal: Strength & Muscle Tone: within normal limits Gait & Station: normal Patient leans: N/A  Psychiatric  Specialty Exam:  Presentation  General Appearance: Bizarre  Eye Contact:Fleeting  Speech:Blocked  Speech Volume:Decreased  Handedness:Right   Mood and Affect  Mood:Euphoric  Affect:Blunt; Flat; Inappropriate   Thought Process  Thought Processes:Disorganized  Descriptions of Associations:Loose  Orientation:Partial  Thought Content:Delusions  History of Schizophrenia/Schizoaffective disorder:No  Duration of Psychotic Symptoms:No data recorded Hallucinations:No data recorded Ideas of Reference:None  Suicidal Thoughts:No data recorded Homicidal Thoughts:No data recorded  Sensorium  Memory:Immediate Fair; Recent Fair; Remote Fair  Judgment:Poor  Insight:Lacking   Executive Functions  Concentration:Fair  Attention Span:Fair  [place]   Psychomotor Activity  Psychomotor Activity:No data recorded  Assets  Assets:Communication Skills; Desire for Improvement; Resilience; Social Support; Physical Health   Sleep  Sleep:No data recorded   Physical Exam: Physical Exam Vitals and nursing note reviewed.  Constitutional:      Appearance: Normal appearance.  HENT:     Head: Normocephalic and atraumatic.     Mouth/Throat:     Pharynx: Oropharynx is clear.  Eyes:     Pupils: Pupils are equal, round, and reactive to light.  Cardiovascular:     Rate and Rhythm: Normal rate and regular rhythm.  Pulmonary:     Effort: Pulmonary effort is normal.     Breath sounds: Normal breath  sounds.  Abdominal:     General: Abdomen is flat.     Palpations: Abdomen is soft.  Musculoskeletal:        General: Normal range of motion.  Skin:    General: Skin is warm and dry.  Neurological:     General: No focal deficit present.     Mental Status: She is alert. Mental status is at baseline.  Psychiatric:        Mood and Affect: Mood normal.        Thought Content: Thought content normal.    Review of Systems  Constitutional: Negative.   HENT: Negative.    Eyes: Negative.   Respiratory: Negative.    Cardiovascular: Negative.   Gastrointestinal: Negative.   Musculoskeletal:  Positive for joint pain and myalgias.  Skin: Negative.   Neurological: Negative.   Psychiatric/Behavioral: Negative.     Blood pressure (!) 127/93, pulse (!) 105, temperature 98.3 F (36.8 C), temperature source Oral, resp. rate 18, height 5\' 1"  (1.549 m), weight 71.2 kg, SpO2 92 %. Body mass index is 29.66 kg/m.   Treatment Plan Summary: Plan is quite possible that the reason she is having pain this morning is that she refused to take her Suboxone yesterday.  Also might be why she is generally feeling more grumpy but she refuses to take the Suboxone.  Will order some Motrin as needed in addition to her Tylenol.  Alethia Berthold, MD 03/20/2022, 11:34 AM

## 2022-03-20 NOTE — Plan of Care (Signed)
Pt presents with a flat affect, denied SI/HI/AVH or self harm thoughts/intent. She continues to complain of anxiety and requesting for anxiety meds stating "no you did not give me my klonopin or is it time for my next dose?" barely 30 minutes after administering meds.  She is eating her meals and hydrating well and engaging in some of the groups offered in the unit. She is tending to her ADL's and interacting with some of her peers while drawing. She ambulates with a steady gait with no falls or unsafe behavior noted thus far. Q15 min observations maintained for safety and support provided as needed.  Problem: Nutrition: Goal: Adequate nutrition will be maintained Outcome: Progressing - Eating all her meals   Problem: Coping: Goal: Level of anxiety will decrease Outcome: Not Progressing - Still complaining of anxiety and asking for anxiety meds   Problem: Elimination: Goal: Will not experience complications related to bowel motility Outcome: Progressing - Reported BM yesterday   Problem: Pain Managment: Goal: General experience of comfort will improve Outcome: Progressing - Utilizing PRN pain meds with good effect   Problem: Safety: Goal: Ability to remain free from injury will improve Outcome: Progressing - Has remained safe in unit

## 2022-03-20 NOTE — Group Note (Signed)
[facility] LCSW Group Therapy Note   Group Date: 03/20/2022 Start Time: 55 End Time: 1330  Type of Therapy and Topic:  Group Therapy:  Feelings around Relapse and Recovery  Participation Level:  Active   Mood:  Description of Group:    Patients in this group will discuss emotions they experience before and after a relapse. They will process how experiencing these feelings, or avoidance of experiencing them, relates to having a relapse. Facilitator will guide patients to explore emotions they have related to recovery. Patients will be encouraged to process which emotions are more powerful. They will be guided to discuss the emotional reaction significant others in their lives may have to patients' relapse or recovery. Patients will be assisted in exploring ways to respond to the emotions of others without this contributing to a relapse.  Therapeutic Goals: Patient will identify two or more emotions that lead to relapse for them:  Patient will identify two emotions that result when they relapse:  Patient will identify two emotions related to recovery:  Patient will demonstrate ability to communicate their needs through discussion and/or role plays.   Summary of Patient Progress: Patient was present for entirety of group session, however, was often tangential to the topic of discussion, required consistent redirection. Patient did participate in introductions.    Therapeutic Modalities:   Cognitive Behavioral Therapy Solution-Focused Therapy Assertiveness Training Relapse Prevention Therapy   Durenda Hurt, [place]

## 2022-03-20 NOTE — BHH Group Notes (Signed)
[facility] Group Notes:  (Nursing/MHT/Case Management/Adjunct)  Date:  03/20/2022  Time:  5:14 PM  Type of Therapy:  Group Therapy  Participation Level:  Active  Participation Quality:  Appropriate and Intrusive  Affect:  Appropriate and Blunted  Cognitive:  Alert, Appropriate, and Disorganized  Insight:  Improving  Engagement in Group:  Improving  Modes of Intervention:  Discussion, Education, and Exploration  Summary of Progress/Problems:  Maglione,Symiah Nowotny E 03/20/2022, 5:14 PM

## 2022-03-20 NOTE — Progress Notes (Signed)
Patient alert and oriented x 4, no distress noted, affect is bright upon approach interacting appropriately with peers and staff. Patient denies SI/HI/AVH, she was offered emotional support. 15 minutes safety check maintained will continue to monitor.

## 2022-03-20 NOTE — Care Plan (Signed)
Pt expressed discomfort this morning in her vagina area. She said she thought she might be starting her period. When asked about it later she said that she did not. She said that she had to take multiple showers last night and tried telling the RN she was in pain, pt felt the RN thought she was med seeking. Pt expressed that it burns when she urniates and that her lower back and has both side lower quad pain. Staff asked for how long and she said about a month. She said she had tried reaching out to her doctor but  to now avail. Pt said that her stomach feels extra extended and she has the urgent feeling to urinate.  Tech informed Therapist, sports. Tech gathered urine sample and gave to [employer].  Tech explained pt how a UTI can make women feel. Pt feels that she tried telling her husband as well as staff this information but she did not know how to explain it without saying she was in pain.

## 2022-03-21 DIAGNOSIS — F251 Schizoaffective disorder, depressive type: Secondary | ICD-10-CM | POA: Diagnosis not present

## 2022-03-21 LAB — URINALYSIS, COMPLETE (UACMP) WITH MICROSCOPIC
Bilirubin Urine: NEGATIVE
Glucose, UA: NEGATIVE mg/dL
Hgb urine dipstick: NEGATIVE
Ketones, ur: NEGATIVE mg/dL
Leukocytes,Ua: NEGATIVE
Nitrite: NEGATIVE
Protein, ur: NEGATIVE mg/dL
Specific Gravity, Urine: 1.011 (ref 1.005–1.030)
pH: 5 (ref 5.0–8.0)

## 2022-03-21 MED ORDER — SULFAMETHOXAZOLE-TRIMETHOPRIM 800-160 MG PO TABS
1.0000 | ORAL_TABLET | Freq: Two times a day (BID) | ORAL | Status: DC
  Administered 2022-03-21 – 2022-03-22 (×2): 1 via ORAL
  Filled 2022-03-21 (×3): qty 1

## 2022-03-21 MED ORDER — FAMOTIDINE 20 MG PO TABS
20.0000 mg | ORAL_TABLET | Freq: Two times a day (BID) | ORAL | Status: DC
  Administered 2022-03-21 – 2022-03-22 (×2): 20 mg via ORAL
  Filled 2022-03-21 (×2): qty 1

## 2022-03-21 MED ORDER — BUPRENORPHINE HCL-NALOXONE HCL 8-2 MG SL SUBL
0.5000 | SUBLINGUAL_TABLET | Freq: Two times a day (BID) | SUBLINGUAL | Status: DC
  Administered 2022-03-21 – 2022-03-22 (×3): 0.5 via SUBLINGUAL
  Filled 2022-03-21 (×3): qty 1

## 2022-03-21 NOTE — Progress Notes (Signed)
[facility] MD Progress Note  03/21/2022 11:43 AM [name]  MRN:  [id number] Subjective: Patient seen and chart reviewed.  Patient has been up out of bed today agitated but not aggressive.  Approach me multiple times wanting to talk about her shoulder pain.  No new injury no indication of any change to her chronic pain.  I informed her again that I think that what she needs is to take her Suboxone but she has very poor insight about that.  Asking also for discharge.  Have not been able to reach her family Principal Problem: Schizoaffective disorder ([place]) Diagnosis: Principal Problem:   Schizoaffective disorder ([place]) Active Problems:   Seizures ([place])   Opiate use  Total Time spent with patient: 30 minutes  Past Psychiatric History: Past history of schizophrenia and bipolar disorder opiate use chronic pain  Past Medical History:  Past Medical History:  Diagnosis Date   Degeneration of lumbar intervertebral disc    Depression    DVT (deep vein thrombosis) in pregnancy    Schizoaffective disorder (HCC)    Seizures (HCC)    Shoulder pain, right     Past Surgical History:  Procedure Laterality Date   CESAREAN SECTION     X2   Family History:  Family History  Problem Relation Age of Onset   Cancer Mother    Family Psychiatric  History: See previous Social History:  Social History   Substance and Sexual Activity  Alcohol Use No     Social History   Substance and Sexual Activity  Drug Use No    Social History   Socioeconomic History   Marital status: Divorced    Spouse name: Not on file   Number of children: Not on file   Years of education: Not on file   Highest education level: Not on file  Occupational History   Occupation: unemployed  Tobacco Use   Smoking status: Every Day    Packs/day: 1.50    Types: Cigarettes   Smokeless tobacco: Never  Vaping Use   Vaping Use: Never used  Substance and Sexual Activity   Alcohol use: No   Drug use: No   Sexual  activity: Not on file  Other Topics Concern   Not on file  Social History Narrative   Not on file   Social Determinants of Health   Financial Resource Strain: Unknown (11/17/2018)   Overall Financial Resource Strain (CARDIA)    Difficulty of Paying Living Expenses: Patient refused  Food Insecurity: No Food Insecurity (03/16/2022)   Hunger Vital Sign    Worried About Running Out of Food in the Last Year: Never true    Ran Out of Food in the Last Year: Never true  Transportation Needs: No Transportation Needs (03/16/2022)   PRAPARE - Hydrologist (Medical): No    Lack of Transportation (Non-Medical): No  Physical Activity: Unknown (11/17/2018)   Exercise Vital Sign    Days of Exercise per Week: Patient refused    Minutes of Exercise per Session: Patient refused  Stress: Unknown (11/17/2018)   [place]    Feeling of Stress : Patient refused  Social Connections: Unknown (11/17/2018)   Social Connection and Isolation Panel [NHANES]    Frequency of Communication with Friends and Family: Patient refused    Frequency of Social Gatherings with Friends and Family: Patient refused    Attends Religious Services: Patient refused    Active Member of  Clubs or Organizations: Patient refused    Attends Archivist Meetings: Patient refused    Marital Status: Patient refused   Additional Social History:                         Sleep: Fair  Appetite:  Fair  Current Medications: Current Facility-Administered Medications  Medication Dose Route Frequency Provider Last Rate Last Admin   acetaminophen (TYLENOL) tablet 650 mg  650 mg Oral Q6H PRN Caroline Sauger, NP   650 mg at 03/21/22 0721   alum & mag hydroxide-simeth (MAALOX/MYLANTA) 200-200-20 MG/5ML suspension 30 mL  30 mL Oral Q4H PRN Caroline Sauger, NP       buprenorphine-naloxone (SUBOXONE) 8-2 mg per SL tablet 0.5  tablet  0.5 tablet Sublingual BID Bubber Rothert, Madie Reno, MD   0.5 tablet at 03/21/22 0945   clonazePAM (KLONOPIN) tablet 1 mg  1 mg Oral TID Celester Lech, Madie Reno, MD   1 mg at 03/21/22 [id number]   haloperidol (HALDOL) tablet 5 mg  5 mg Oral Q6H PRN Wylan Gentzler, Madie Reno, MD   5 mg at 03/18/22 2058   Or   haloperidol lactate (HALDOL) injection 5 mg  5 mg Intramuscular Q6H PRN Amorie Rentz T, MD       ibuprofen (ADVIL) tablet 600 mg  600 mg Oral Q6H PRN Janyia Guion, Madie Reno, MD   600 mg at 03/21/22 [id number]   lacosamide (VIMPAT) tablet 50 mg  50 mg Oral BID Jennica Tagliaferri T, MD   50 mg at 03/21/22 [id number]   magnesium hydroxide (MILK OF MAGNESIA) suspension 30 mL  30 mL Oral Daily PRN Caroline Sauger, NP   30 mL at 03/19/22 1023   nicotine (NICODERM CQ - dosed in mg/24 hours) patch 21 mg  21 mg Transdermal Daily Jamin Humphries, Madie Reno, MD   21 mg at 03/21/22 [id number]   nicotine polacrilex (NICORETTE) gum 2 mg  2 mg Oral PRN Arita Severtson T, MD       PARoxetine (PAXIL) tablet 20 mg  20 mg Oral QHS Caroline Sauger, NP   20 mg at 03/20/22 2102   polyethylene glycol (MIRALAX / GLYCOLAX) packet 17 g  17 g Oral Daily Nichlos Kunzler, Madie Reno, MD   17 g at 03/21/22 [id number]   QUEtiapine (SEROQUEL) tablet 800 mg  800 mg Oral QHS Jilleen Essner T, MD   800 mg at 03/20/22 2102   senna (SENOKOT) tablet 17.2 mg  2 tablet Oral Daily PRN Adiel Mcnamara, Madie Reno, MD        Lab Results: No results found for this or any previous visit (from the past 48 hour(s)).  Blood Alcohol level:  Lab Results  Component Value Date   ETH <10 03/16/2022   ETH <10 0000000    Metabolic Disorder Labs: Lab Results  Component Value Date   HGBA1C 5.2 03/17/2022   MPG 102.54 03/17/2022   MPG 88 03/25/2016   No results found for: "PROLACTIN" Lab Results  Component Value Date   CHOL 282 (H) 03/17/2022   TRIG 208 (H) 03/17/2022   HDL 34 (L) 03/17/2022   CHOLHDL 8.3 03/17/2022   VLDL 42 (H) 03/17/2022   LDLCALC 206 (H) 03/17/2022   LDLCALC 116 (H) 03/25/2016    Physical  Findings: AIMS: Facial and Oral Movements Muscles of Facial Expression: None, normal Lips and Perioral Area: None, normal Jaw: None, normal Tongue: None, normal,Extremity Movements Upper (arms, wrists, hands, fingers): None, normal Lower (legs, knees, ankles, toes): None,  normal, Trunk Movements Neck, shoulders, hips: None, normal, Overall Severity Severity of abnormal movements (highest score from questions above): None, normal Incapacitation due to abnormal movements: None, normal Patient's awareness of abnormal movements (rate only patient's report): No Awareness, Dental Status Current problems with teeth and/or dentures?: No Does patient usually wear dentures?: No  CIWA:    COWS:     Musculoskeletal: Strength & Muscle Tone: within normal limits Gait & Station: normal Patient leans: N/A  Psychiatric Specialty Exam:  Presentation  General Appearance: Bizarre  Eye Contact:Fleeting  Speech:Blocked  Speech Volume:Decreased  Handedness:Right   Mood and Affect  Mood:Euphoric  Affect:Blunt; Flat; Inappropriate   Thought Process  Thought Processes:Disorganized  Descriptions of Associations:Loose  Orientation:Partial  Thought Content:Delusions  History of Schizophrenia/Schizoaffective disorder:No  Duration of Psychotic Symptoms:No data recorded Hallucinations:No data recorded Ideas of Reference:None  Suicidal Thoughts:No data recorded Homicidal Thoughts:No data recorded  Sensorium  Memory:Immediate Fair; Recent Fair; Remote Fair  Judgment:Poor  Insight:Lacking   Executive Functions  Concentration:Fair  Attention Span:Fair  [place]   Psychomotor Activity  Psychomotor Activity:No data recorded  Assets  Assets:Communication Skills; Desire for Improvement; Resilience; Social Support; Physical Health   Sleep  Sleep:No data recorded   Physical Exam: Physical Exam Vitals and nursing note  reviewed.  Constitutional:      Appearance: Normal appearance.  HENT:     Head: Normocephalic and atraumatic.     Mouth/Throat:     Pharynx: Oropharynx is clear.  Eyes:     Pupils: Pupils are equal, round, and reactive to light.  Cardiovascular:     Rate and Rhythm: Normal rate and regular rhythm.  Pulmonary:     Effort: Pulmonary effort is normal.     Breath sounds: Normal breath sounds.  Abdominal:     General: Abdomen is flat.     Palpations: Abdomen is soft.  Musculoskeletal:        General: Normal range of motion.  Skin:    General: Skin is warm and dry.  Neurological:     General: No focal deficit present.     Mental Status: She is alert. Mental status is at baseline.  Psychiatric:        Attention and Perception: She is inattentive.        Mood and Affect: Mood is depressed.        Speech: Speech is tangential.        Behavior: Behavior is agitated. Behavior is not aggressive.        Thought Content: Thought content normal.        Cognition and Memory: Cognition is impaired.        Judgment: Judgment is impulsive.    Review of Systems  Constitutional: Negative.   HENT: Negative.    Eyes: Negative.   Respiratory: Negative.    Cardiovascular: Negative.   Gastrointestinal: Negative.   Musculoskeletal:  Positive for joint pain.  Skin: Negative.   Neurological: Negative.   Psychiatric/Behavioral:  Negative for depression, hallucinations, memory loss, substance abuse and suicidal ideas. The patient is nervous/anxious and has insomnia.    Blood pressure 116/87, pulse (!) 103, temperature 97.7 F (36.5 C), temperature source Oral, resp. rate 18, height 5\' 1"  (1.549 m), weight 71.2 kg, SpO2 95 %. Body mass index is 29.66 kg/m.   Treatment Plan Summary: Plan I explained to her several times today that she needs to take the Suboxone and will help with her pain and that it is probably  the reason why she is having worse pain now that she is refusing it.  Finally I think  she agreed.  No change to psych medicine.  Hope we can possibly see her discharge later this week  Alethia Berthold, MD 03/21/2022, 11:43 AM

## 2022-03-21 NOTE — Progress Notes (Signed)
Recreation Therapy Notes  Date: 03/21/2022  Time: 10:00 am   Location: Craft room    Behavioral response: N/A   Intervention Topic: Relaxation   Discussion/Intervention: Patient refused to attend group.   Clinical Observations/Feedback:  Patient refused to attend group.   Myeesha Shane LRT/CTRS        Gearold Wainer 03/21/2022 11:25 AM

## 2022-03-21 NOTE — [facility] Suicide Risk Assessment (Signed)
[place] INPATIENT:  Family/Significant Other Suicide Prevention Education  Suicide Prevention Education:  Education Completed; [name]/partner [phone]), has been identified by the patient as the family member/significant other with whom the patient will be residing, and identified as the person(s) who will aid the patient in the event of a mental health crisis (suicidal ideations/suicide attempt).  With written consent from the patient, the family member/significant other has been provided the following suicide prevention education, prior to the and/or following the discharge of the patient.  The suicide prevention education provided includes the following: Suicide risk factors Suicide prevention and interventions National Suicide Hotline telephone number [facility] assessment telephone number [facility] Emergency Assistance [place] and/or Residential Mobile Crisis Unit telephone number  Request made of family/significant other to: Remove weapons (e.g., guns, rifles, knives), all items previously/currently identified as safety concern.   Remove drugs/medications (over-the-counter, prescriptions, illicit drugs), all items previously/currently identified as a safety concern.  The family member/significant other verbalizes understanding of the suicide prevention education information provided.  The family member/significant other agrees to remove the items of safety concern listed above.  [name] shares that pt came into the hospital because she was not making any sense. He stated that pt kept talking about devils and all that stuff. He did state that he felt pt was not a danger to herself or anyone else. [name] denied pt having any access to weapons outside of [employer]. CSW recommended that these be locked up until he and pt came to an agreement/understanding that she could be safe with them out. He inquired about how she was doing. CSW stated that he had  not had the opportunity to meet with pt himself to see. [name] stated that he just wants pt to get better. CSW stated that the focus here is to get her to a place where she is better and can return home and be productive. He agreed. No other concerns expressed. Contact ended without incident.   [name] 03/21/2022, 2:05 PM

## 2022-03-21 NOTE — Progress Notes (Signed)
Pt denies SI/HI/AVH and verbally agrees to approach staff if these become apparent or before harming themselves/others. Rates depression 10/10. Rates anxiety 10/10. Rates pain 10/10. Pt has been very needy and constantly at the nurses station door asking for something or for her medications. Pt has been in a good mood, just very needy. Pt has been out of her room for most of the day and is always complaining of pain. Pt states that none of her pain medications work. Pt makes some delusional remarks randomly like, "I am not a ghost and you're not a ghost." Scheduled medications administered to pt, per MD orders. RN provided support and encouragement to pt. Q15 min safety checks implemented and continued. Pt safe on the unit. RN will continue to monitor and intervene as needed.  Problem: Education: Goal: Will be free of psychotic symptoms Outcome: Not Progressing   Problem: Coping: Goal: Coping ability will improve Outcome: Not Progressing   03/21/22 0721  Psych Admission Type (Psych Patients Only)  Admission Status Involuntary  Psychosocial Assessment  Patient Complaints Anxiety;Depression  Eye Contact Fair  Facial Expression Sullen  Affect Sullen  Speech Soft;Logical/coherent  Interaction Attention-seeking;Demanding;Needy  Motor Activity Slow  Appearance/Hygiene In scrubs;Unremarkable  Behavior Characteristics Cooperative;Anxious;Guarded  Mood Anxious;Depressed  Thought Process  Coherency Circumstantial;Disorganized  Content Preoccupation;Delusions  Delusions None reported or observed  Perception WDL  Hallucination None reported or observed  Judgment Poor  Confusion None  Danger to Self  Current suicidal ideation? Denies  Danger to Others  Danger to Others None reported or observed

## 2022-03-21 NOTE — Plan of Care (Signed)
  Problem: Education: Goal: Knowledge of General Education information will improve Description: Including pain rating scale, medication(s)/side effects and non-pharmacologic comfort measures Outcome: Progressing   Problem: Health Behavior/Discharge Planning: Goal: Ability to manage health-related needs will improve Outcome: Progressing   Problem: Clinical Measurements: Goal: Ability to maintain clinical measurements within normal limits will improve Outcome: Progressing Goal: Will remain free from infection Outcome: Progressing Goal: Diagnostic test results will improve Outcome: Progressing Goal: Respiratory complications will improve Outcome: Progressing Goal: Cardiovascular complication will be avoided Outcome: Progressing   Problem: Self-Concept: Goal: Will verbalize positive feelings about self Outcome: Progressing   Problem: Safety: Goal: Ability to redirect hostility and anger into socially appropriate behaviors will improve Outcome: Progressing Goal: Ability to remain free from injury will improve Outcome: Progressing   Problem: Role Relationship: Goal: Ability to communicate needs accurately will improve Outcome: Progressing Goal: Ability to interact with others will improve Outcome: Progressing

## 2022-03-21 NOTE — Group Note (Signed)
[facility] LCSW Group Therapy Note    Group Date: 03/21/2022 Start Time: 1300 End Time: 1400  Type of Therapy and Topic:  Group Therapy:  Overcoming Obstacles  Participation Level:  [facility] PARTICIPATION LEVEL: Did Not Attend   Description of Group:   In this group patients will be encouraged to explore what they see as obstacles to their own wellness and recovery. They will be guided to discuss their thoughts, feelings, and behaviors related to these obstacles. The group will process together ways to cope with barriers, with attention given to specific choices patients can make. Each patient will be challenged to identify changes they are motivated to make in order to overcome their obstacles. This group will be process-oriented, with patients participating in exploration of their own experiences as well as giving and receiving support and challenge from other group members.  Therapeutic Goals: 1. Patient will identify personal and current obstacles as they relate to admission. 2. Patient will identify barriers that currently interfere with their wellness or overcoming obstacles.  3. Patient will identify feelings, thought process and behaviors related to these barriers. 4. Patient will identify two changes they are willing to make to overcome these obstacles:    Summary of Patient Progress Group not held due to a complex discharge which spanned the entire day.   Therapeutic Modalities:   Cognitive Behavioral Therapy Solution Focused Therapy Motivational Interviewing Relapse Prevention Therapy   Sarahy Creedon R Keysha Damewood, LCSW 

## 2022-03-21 NOTE — Progress Notes (Signed)
Patient just received pain medication. Was at the nurse station with complaint of shoulder and back pain.  Tells the story of falling out of a deer spotting tree many years ago and hurting herself.  Reports that she has been receiving treatment for pain ever since.     C Butler-Nicholson, LPN

## 2022-03-21 NOTE — [facility] Group Notes (Signed)
[facility] Group Notes:  (Nursing/MHT/Case Management/Adjunct)  Date:  03/21/2022  Time:  9:40 AM  Type of Therapy:   community meeting  Participation Level:  Minimal  Participation Quality:  Drowsy  Affect:  Flat  Cognitive:  Disorganized  Insight:  None  Engagement in Group:  Lacking  Modes of Intervention:  Discussion and Education  Summary of Progress/Problems:  Antonieta Pert 03/21/2022, 9:40 AM

## 2022-03-22 DIAGNOSIS — F251 Schizoaffective disorder, depressive type: Secondary | ICD-10-CM | POA: Diagnosis not present

## 2022-03-22 MED ORDER — CLONAZEPAM 1 MG PO TABS: 1.0000 mg | ORAL_TABLET | Freq: Three times a day (TID) | ORAL | 0 refills | Status: DC

## 2022-03-22 MED ORDER — SULFAMETHOXAZOLE-TRIMETHOPRIM 800-160 MG PO TABS: 1.0000 | ORAL_TABLET | Freq: Two times a day (BID) | ORAL | 0 refills | Status: DC

## 2022-03-22 MED ORDER — QUETIAPINE FUMARATE 400 MG PO TABS: 800.0000 mg | ORAL_TABLET | Freq: Every day | ORAL | 1 refills | Status: DC

## 2022-03-22 MED ORDER — FAMOTIDINE 20 MG PO TABS: 20.0000 mg | ORAL_TABLET | Freq: Two times a day (BID) | ORAL | 1 refills | Status: DC

## 2022-03-22 MED ORDER — PAROXETINE HCL 20 MG PO TABS: 20.0000 mg | ORAL_TABLET | Freq: Every day | ORAL | 1 refills | Status: DC

## 2022-03-22 MED ORDER — NICOTINE 21 MG/24HR TD PT24: 21.0000 mg | MEDICATED_PATCH | Freq: Every day | TRANSDERMAL | 0 refills | Status: DC

## 2022-03-22 MED ORDER — LACOSAMIDE 50 MG PO TABS: 50.0000 mg | ORAL_TABLET | Freq: Two times a day (BID) | ORAL | 1 refills | Status: DC

## 2022-03-22 MED ORDER — POLYETHYLENE GLYCOL 3350 17 G PO PACK: 17.0000 g | PACK | Freq: Every day | ORAL | 1 refills | Status: DC

## 2022-03-22 MED ORDER — BUPRENORPHINE HCL-NALOXONE HCL 8-2 MG SL SUBL: 0.5000 | SUBLINGUAL_TABLET | Freq: Two times a day (BID) | SUBLINGUAL | 0 refills | Status: DC

## 2022-03-22 NOTE — Discharge Summary (Signed)
Physician Discharge Summary Note  Patient:  [name] is an 41 y.o., female MRN:  [id number] DOB:  1981/01/30 Patient phone:  [phone] (home)  Patient address:   [place] [address],  Total Time spent with patient: 30 minutes  Date of Admission:  03/16/2022 Date of Discharge: 03/22/2022  Reason for Admission: Patient was admitted because of worsening psychotic symptoms and agitation bizarre behavior and poor self-care  Principal Problem: Schizoaffective disorder [facility]) Discharge Diagnoses: Principal Problem:   Schizoaffective disorder ([place]) Active Problems:   Seizures ([place])   Opiate use   Past Psychiatric History: Past history of psychotic disorder bipolar versus schizoaffective as well as past history of substance use problems  Past Medical History:  Past Medical History:  Diagnosis Date   Degeneration of lumbar intervertebral disc    Depression    DVT (deep vein thrombosis) in pregnancy    Schizoaffective disorder ([place])    Seizures ([place])    Shoulder pain, right     Past Surgical History:  Procedure Laterality Date   CESAREAN SECTION     X2   Family History:  Family History  Problem Relation Age of Onset   Cancer Mother    Family Psychiatric  History: See previous Social History:  Social History   Substance and Sexual Activity  Alcohol Use No     Social History   Substance and Sexual Activity  Drug Use No    Social History   Socioeconomic History   Marital status: Divorced    Spouse name: Not on file   Number of children: Not on file   Years of education: Not on file   Highest education level: Not on file  Occupational History   Occupation: unemployed  Tobacco Use   Smoking status: Every Day    Packs/day: 1.50    Types: Cigarettes   Smokeless tobacco: Never  Vaping Use   Vaping Use: Never used  Substance and Sexual Activity   Alcohol use: No   Drug use: No   Sexual activity: Not on file  Other Topics  Concern   Not on file  Social History Narrative   Not on file   Social Determinants of Health   Financial Resource Strain: Unknown (11/17/2018)   Overall Financial Resource Strain (CARDIA)    Difficulty of Paying Living Expenses: Patient refused  Food Insecurity: No Food Insecurity (03/16/2022)   Hunger Vital Sign    Worried About Running Out of Food in the Last Year: Never true    Ran Out of Food in the Last Year: Never true  Transportation Needs: No Transportation Needs (03/16/2022)   PRAPARE - Hydrologist (Medical): No    Lack of Transportation (Non-Medical): No  Physical Activity: Unknown (11/17/2018)   Exercise Vital Sign    Days of Exercise per Week: Patient refused    Minutes of Exercise per Session: Patient refused  Stress: Unknown (11/17/2018)   [place]    Feeling of Stress : Patient refused  Social Connections: Unknown (11/17/2018)   Social Connection and Isolation Panel [NHANES]    Frequency of Communication with Friends and Family: Patient refused    Frequency of Social Gatherings with Friends and Family: Patient refused    Attends Religious Services: Patient refused    Active Member of Clubs or Organizations: Patient refused    Attends Archivist Meetings: Patient refused    Marital Status: Patient  refused    Hospital Course: Patient was admitted to the psychiatric unit.  15-minute checks were continued.  Medication was adjusted with continuing her high dose Seroquel and continuing Vimpat as her only antiseizure medicine after confirming that was her most recent prescription with the pharmacy.  Continue Klonopin 3 times a day after confirming with database.  Patient was continued on Suboxone which has been prescribed as an outpatient.  At 1 point she became hesitant to take it for reasons that never made much sense.  She subsequently developed pain in her shoulder and  her back.  I had to convince her that this was probably from withdrawing from the Suboxone which she had been tolerating fine.  The patient consented to restart the Suboxone and the pain has improved.  Furthermore she had a urinary tract infection with back pain after confirming that with a urine analysis she has had improvement in that.  Today her mood is upbeat smiling and she is lucid with clear thinking no evidence of hallucinations completely denies suicidal thoughts.  She is referred to stay on her medicine and not continue using any drugs.  Follow-up with her usual outpatient mental health provider.  Prescriptions provided.  Physical Findings: AIMS: Facial and Oral Movements Muscles of Facial Expression: None, normal Lips and Perioral Area: None, normal Jaw: None, normal Tongue: None, normal,Extremity Movements Upper (arms, wrists, hands, fingers): None, normal Lower (legs, knees, ankles, toes): None, normal, Trunk Movements Neck, shoulders, hips: None, normal, Overall Severity Severity of abnormal movements (highest score from questions above): None, normal Incapacitation due to abnormal movements: None, normal Patient's awareness of abnormal movements (rate only patient's report): No Awareness, Dental Status Current problems with teeth and/or dentures?: No Does patient usually wear dentures?: No  CIWA:    COWS:     Musculoskeletal: Strength & Muscle Tone: within normal limits Gait & Station: normal Patient leans: N/A   Psychiatric Specialty Exam:  Presentation  General Appearance: Bizarre  Eye Contact:Fleeting  Speech:Blocked  Speech Volume:Decreased  Handedness:Right   Mood and Affect  Mood:Euphoric  Affect:Blunt; Flat; Inappropriate   Thought Process  Thought Processes:Disorganized  Descriptions of Associations:Loose  Orientation:Partial  Thought Content:Delusions  History of Schizophrenia/Schizoaffective disorder:No  Duration of Psychotic  Symptoms:No data recorded Hallucinations:No data recorded Ideas of Reference:None  Suicidal Thoughts:No data recorded Homicidal Thoughts:No data recorded  Sensorium  Memory:Immediate Fair; Recent Fair; Remote Fair  Judgment:Poor  Insight:Lacking   Executive Functions  Concentration:Fair  Attention Span:Fair  Recall:Fair  Fund of Knowledge:Fair  Language:Fair   Psychomotor Activity  Psychomotor Activity:No data recorded  Assets  Assets:Communication Skills; Desire for Improvement; Resilience; Social Support; Physical Health   Sleep  Sleep:No data recorded   Physical Exam: Physical Exam Constitutional:      Appearance: Normal appearance.  HENT:     Head: Normocephalic and atraumatic.     Mouth/Throat:     Pharynx: Oropharynx is clear.  Eyes:     Pupils: Pupils are equal, round, and reactive to light.  Cardiovascular:     Rate and Rhythm: Normal rate and regular rhythm.  Pulmonary:     Effort: Pulmonary effort is normal.     Breath sounds: Normal breath sounds.  Abdominal:     General: Abdomen is flat.     Palpations: Abdomen is soft.  Musculoskeletal:        General: Normal range of motion.  Skin:    General: Skin is warm and dry.  Neurological:     General: No focal  deficit present.     Mental Status: She is alert. Mental status is at baseline.  Psychiatric:        Mood and Affect: Mood normal.        Thought Content: Thought content normal.    Review of Systems  Constitutional: Negative.   HENT: Negative.    Eyes: Negative.   Respiratory: Negative.    Cardiovascular: Negative.   Gastrointestinal: Negative.   Musculoskeletal: Negative.   Skin: Negative.   Neurological: Negative.   Psychiatric/Behavioral: Negative.     Blood pressure 116/87, pulse (!) 103, temperature 97.7 F (36.5 C), temperature source Oral, resp. rate 18, height 5\' 1"  (1.549 m), weight 71.2 kg, SpO2 95 %. Body mass index is 29.66 kg/m.   Social History   Tobacco  Use  Smoking Status Every Day   Packs/day: 1.50   Types: Cigarettes  Smokeless Tobacco Never   Tobacco Cessation:  A prescription for an FDA-approved tobacco cessation medication provided at discharge   Blood Alcohol level:  Lab Results  Component Value Date   [facility] <10 03/16/2022   ETH <10 02/01/2020    Metabolic Disorder Labs:  Lab Results  Component Value Date   HGBA1C 5.2 03/17/2022   MPG 102.54 03/17/2022   MPG 88 03/25/2016   No results found for: "PROLACTIN" Lab Results  Component Value Date   CHOL 282 (H) 03/17/2022   TRIG 208 (H) 03/17/2022   HDL 34 (L) 03/17/2022   CHOLHDL 8.3 03/17/2022   VLDL 42 (H) 03/17/2022   LDLCALC 206 (H) 03/17/2022   LDLCALC 116 (H) 03/25/2016    See Psychiatric Specialty Exam and Suicide Risk Assessment completed by Attending Physician prior to discharge.  Discharge destination:  Home  Is patient on multiple antipsychotic therapies at discharge:  No   Has Patient had three or more failed trials of antipsychotic monotherapy by history:  No  Recommended Plan for Multiple Antipsychotic Therapies: NA  Discharge Instructions     Diet - low sodium heart healthy   Complete by: As directed    Increase activity slowly   Complete by: As directed       Allergies as of 03/22/2022       Reactions   Latex Rash, Swelling   Other reaction(s): Unknown Skin turns red   Morphine Other (See Comments), Swelling   Patient states that her reaction is that her skin gets a bit red.   No urticaria or airway difficulty.     Prednisone Other (See Comments)   Other reaction(s): Other (See Comments) Stayed up 30 days when pt. Was on prednisone insomnia   Buprenorphine Hcl-naloxone Hcl    Duloxetine Other (See Comments)   Unknown Unknown   Ketorolac Tromethamine    Lithium    drool   Tramadol    Other reaction(s): Other (See Comments) Seizures   Poison Ivy Extract [poison Ivy Extract] Rash   Poison Oak Extract [poison Oak Extract] Rash    [place] Rash        Medication List     STOP taking these medications    Buprenorphine HCl-Naloxone HCl 8-2 MG Film Replaced by: buprenorphine-naloxone 8-2 mg Subl SL tablet   levETIRAcetam 1000 MG tablet Commonly known as: KEPPRA   oxyCODONE-acetaminophen 5-325 MG tablet Commonly known as: PERCOCET/ROXICET       TAKE these medications      Indication  buprenorphine-naloxone 8-2 mg Subl SL tablet Commonly known as: SUBOXONE Place 0.5 tablets under the tongue 2 (two) times daily.  Replaces: Buprenorphine HCl-Naloxone HCl 8-2 MG Film  Indication: Opioid Dependence   clonazePAM 1 MG tablet Commonly known as: KLONOPIN Take 1 tablet (1 mg total) by mouth 3 (three) times daily.  Indication: Feeling Anxious   famotidine 20 MG tablet Commonly known as: PEPCID Take 1 tablet (20 mg total) by mouth 2 (two) times daily.  Indication: Gastroesophageal Reflux Disease   lacosamide 50 MG Tabs tablet Commonly known as: VIMPAT Take 1 tablet (50 mg total) by mouth 2 (two) times daily. What changed:  medication strength how much to take  Indication: Partial Onset Seizure   nicotine 21 mg/24hr patch Commonly known as: NICODERM CQ - dosed in mg/24 hours Place 1 patch (21 mg total) onto the skin daily.  Indication: Nicotine Addiction   PARoxetine 20 MG tablet Commonly known as: PAXIL Take 1 tablet (20 mg total) by mouth at bedtime.  Indication: Major Depressive Disorder   polyethylene glycol 17 g packet Commonly known as: MIRALAX / GLYCOLAX Take 17 g by mouth daily. Start taking on: March 23, 2022  Indication: Constipation   QUEtiapine 400 MG tablet Commonly known as: SEROQUEL Take 2 tablets (800 mg total) by mouth at bedtime. What changed: how much to take  Indication: Depressive Phase of Manic-Depression   sulfamethoxazole-trimethoprim 800-160 MG tablet Commonly known as: BACTRIM DS Take 1 tablet by mouth every 12 (twelve) hours.  Indication: Inflammation of  Bladder due to Infection        Follow-up Information     Care, [facility]. Go to.   Why: Your appointment is scheduled for Tuesday, 03/29/22 at 11:40AM. Thanks! Contact information: [address] [place] [address] [phone]                 Follow-up recommendations:  Other:  Follow-up outpatient mental health provider in [place]  Comments: No samples provided prescriptions have been sent in electronically  Signed: Mordecai Rasmussen, MD 03/22/2022, 10:53 AM

## 2022-03-22 NOTE — Progress Notes (Signed)
  [facility] Adult Case Management Discharge Plan :  Will you be returning to the same living situation after discharge:  Yes,  pt plans to return home. At discharge, do you have transportation home?: Yes,  pt partner to provide transportation home. Do you have the ability to pay for your medications: Yes,  [employer]  Release of information consent forms completed and in the chart;  Patient's signature needed at discharge.  Patient to Follow up at:  Follow-up Information     Care, [place] to.   Why: Your appointment is scheduled for 03/29/22 at 11:40AM. Thanks! Contact information: [place] [address] [phone]                 Next level of care provider has access to [place] and Suicide Prevention discussed: Yes,  SPE completed with partner, Charlsie Merles.     Has patient been referred to the Quitline?: Patient refused referral  Patient has been referred for addiction treatment: Pt. refused referral  Shirl Harris, LCSW 03/22/2022, 9:23 AM

## 2022-03-22 NOTE — Progress Notes (Signed)
Patient was educated on prescriptions and follow up care. Pt questions were answered and pt verbalized understanding and did not voice any concerns. Pt's belongings were returned. Patient was safely discharged to the [facility]. Patient denies SI, HI, and AVH. Patient was not observed to be in any distress at time of discharge.

## 2022-03-22 NOTE — Progress Notes (Signed)
   03/22/22 0600  Psych Admission Type (Psych Patients Only)  Admission Status Involuntary  Psychosocial Assessment  Patient Complaints Anxiety;Depression  Eye Contact Fair  Facial Expression Sullen  Affect Sullen  Speech Logical/coherent;Soft  Interaction Attention-seeking;Demanding  Motor Activity Slow  Appearance/Hygiene In scrubs;Unremarkable  Behavior Characteristics Cooperative;Anxious;Guarded  Mood Anxious;Depressed  Thought Process  Coherency Circumstantial;Disorganized  Content Preoccupation;Delusions  Delusions None reported or observed  Perception WDL  Hallucination None reported or observed  Judgment Poor  Confusion None  Danger to Self  Current suicidal ideation? Denies  Danger to Others  Danger to Others None reported or observed

## 2022-03-22 NOTE — Progress Notes (Signed)
Patient denies SI, HI, AVH, depression, and anxiety. She says she is having generalized pain but denies needing any PRN pain medication. Patient is compliant with scheduled medications. Patient is coming back and forth to the nurses station throughout the morning. Patient remains safe on the unit at this time.

## 2022-03-22 NOTE — BHH Suicide Risk Assessment (Signed)
[facility] Discharge Suicide Risk Assessment   Principal Problem: Schizoaffective disorder [facility]) Discharge Diagnoses: Principal Problem:   Schizoaffective disorder ([facility]) Active Problems:   Seizures ([facility])   Opiate use   Total Time spent with patient: 30 minutes  Musculoskeletal: Strength & Muscle Tone: within normal limits Gait & Station: normal Patient leans: N/A  Psychiatric Specialty Exam  Presentation  General Appearance: Bizarre  Eye Contact:Fleeting  Speech:Blocked  Speech Volume:Decreased  Handedness:Right   Mood and Affect  Mood:Euphoric  Duration of Depression Symptoms: No data recorded Affect:Blunt; Flat; Inappropriate   Thought Process  Thought Processes:Disorganized  Descriptions of Associations:Loose  Orientation:Partial  Thought Content:Delusions  History of Schizophrenia/Schizoaffective disorder:No  Duration of Psychotic Symptoms:No data recorded Hallucinations:No data recorded Ideas of Reference:None  Suicidal Thoughts:No data recorded Homicidal Thoughts:No data recorded  Sensorium  Memory:Immediate Fair; Recent Fair; Remote Fair  Judgment:Poor  Insight:Lacking   Executive Functions  Concentration:Fair  Attention Span:Fair  Recall:Fair  Fund of Knowledge:Fair  Language:Fair   Psychomotor Activity  Psychomotor Activity:No data recorded  Assets  Assets:Communication Skills; Desire for Improvement; Resilience; Social Support; Physical Health   Sleep  Sleep:No data recorded  Physical Exam: Physical Exam Vitals and nursing note reviewed.  Constitutional:      Appearance: Normal appearance.  HENT:     Head: Normocephalic and atraumatic.     Mouth/Throat:     Pharynx: Oropharynx is clear.  Eyes:     Pupils: Pupils are equal, round, and reactive to light.  Cardiovascular:     Rate and Rhythm: Normal rate and regular rhythm.  Pulmonary:     Effort: Pulmonary effort is normal.     Breath sounds: Normal breath sounds.   Abdominal:     General: Abdomen is flat.     Palpations: Abdomen is soft.  Musculoskeletal:        General: Normal range of motion.  Skin:    General: Skin is warm and dry.  Neurological:     General: No focal deficit present.     Mental Status: She is alert. Mental status is at baseline.  Psychiatric:        Attention and Perception: Attention normal.        Mood and Affect: Mood normal.        Speech: Speech normal.        Behavior: Behavior normal.        Thought Content: Thought content normal.        Cognition and Memory: Cognition normal.        Judgment: Judgment normal.    Review of Systems  Constitutional: Negative.   HENT: Negative.    Eyes: Negative.   Respiratory: Negative.    Cardiovascular: Negative.   Gastrointestinal: Negative.   Musculoskeletal: Negative.   Skin: Negative.   Neurological: Negative.   Psychiatric/Behavioral: Negative.     Blood pressure 116/87, pulse (!) 103, temperature 97.7 F (36.5 C), temperature source Oral, resp. rate 18, height 5\' 1"  (1.549 m), weight 71.2 kg, SpO2 95 %. Body mass index is 29.66 kg/m.  Mental Status Per Nursing Assessment::   On Admission:  NA  Demographic Factors:  Caucasian  Loss Factors: NA  Historical Factors: Impulsivity  Risk Reduction Factors:   Positive social support and Positive therapeutic relationship  Continued Clinical Symptoms:  Bipolar Disorder:   Mixed State Schizophrenia:   Depressive state  Cognitive Features That Contribute To Risk:  None    Suicide Risk:  Minimal: No identifiable suicidal ideation.  Patients presenting with  no risk factors but with morbid ruminations; may be classified as minimal risk based on the severity of the depressive symptoms   Follow-up Information     Care, [place] to.   Why: Your appointment is scheduled for Tuesday, 03/29/22 at 11:40AM. Thanks! Contact information: [place] [address] [phone]                  Plan Of Care/Follow-up recommendations:  Other:  Patient is to follow-up with her primary local psychiatric provider as usual while continuing current medications.  Strongly advised her to avoid abusing any drugs or any nonprescription medicine.  Keep sleep cycle stable.  She is denying all suicidal ideation and appears upbeat lucid and with good insight at this point  Alethia Berthold, MD 03/22/2022, 10:47 AM

## 2022-07-31 ENCOUNTER — Emergency Department
Admission: EM | Admit: 2022-07-31 | Discharge: 2022-08-01 | Disposition: A | Discharge status: Other Acute Inpatient Hospital | Payer: Medicare Other | Attending: Emergency Medicine | Admitting: Emergency Medicine

## 2022-07-31 ENCOUNTER — Other Ambulatory Visit: Payer: Self-pay

## 2022-07-31 DIAGNOSIS — E876 Hypokalemia: Secondary | ICD-10-CM | POA: Diagnosis not present

## 2022-07-31 DIAGNOSIS — F22 Delusional disorders: Secondary | ICD-10-CM | POA: Diagnosis present

## 2022-07-31 DIAGNOSIS — R45851 Suicidal ideations: Secondary | ICD-10-CM

## 2022-07-31 DIAGNOSIS — Z1152 Encounter for screening for COVID-19: Secondary | ICD-10-CM | POA: Insufficient documentation

## 2022-07-31 DIAGNOSIS — F250 Schizoaffective disorder, bipolar type: Secondary | ICD-10-CM

## 2022-07-31 DIAGNOSIS — R4182 Altered mental status, unspecified: Secondary | ICD-10-CM | POA: Diagnosis present

## 2022-07-31 DIAGNOSIS — F259 Schizoaffective disorder, unspecified: Secondary | ICD-10-CM | POA: Diagnosis present

## 2022-07-31 DIAGNOSIS — R410 Disorientation, unspecified: Secondary | ICD-10-CM | POA: Diagnosis not present

## 2022-07-31 LAB — CBC
HCT: 43 % (ref 36.0–46.0)
Hemoglobin: 14.2 g/dL (ref 12.0–15.0)
MCH: 31.9 pg (ref 26.0–34.0)
MCHC: 33 g/dL (ref 30.0–36.0)
MCV: 96.6 fL (ref 80.0–100.0)
Platelets: 275 10*3/uL (ref 150–400)
RBC: 4.45 MIL/uL (ref 3.87–5.11)
RDW: 13.1 % (ref 11.5–15.5)
WBC: 8.9 10*3/uL (ref 4.0–10.5)
nRBC: 0 % (ref 0.0–0.2)

## 2022-07-31 LAB — COMPREHENSIVE METABOLIC PANEL
ALT: 16 U/L (ref 0–44)
AST: 21 U/L (ref 15–41)
Albumin: 3.7 g/dL (ref 3.5–5.0)
Alkaline Phosphatase: 69 U/L (ref 38–126)
Anion gap: 15 (ref 5–15)
BUN: 9 mg/dL (ref 6–20)
CO2: 21 mmol/L — ABNORMAL LOW (ref 22–32)
Calcium: 8.6 mg/dL — ABNORMAL LOW (ref 8.9–10.3)
Chloride: 103 mmol/L (ref 98–111)
Creatinine, Ser: 0.92 mg/dL (ref 0.44–1.00)
GFR, Estimated: >60 mL/min (ref >60)
Glucose, Bld: 179 mg/dL — ABNORMAL HIGH (ref 70–99)
Potassium: 2.6 mmol/L — CL (ref 3.5–5.1)
Sodium: 139 mmol/L (ref 135–145)
Total Bilirubin: 0.6 mg/dL (ref 0.3–1.2)
Total Protein: 6.9 g/dL (ref 6.5–8.1)

## 2022-07-31 LAB — MAGNESIUM: Magnesium: 2 mg/dL (ref 1.7–2.4)

## 2022-07-31 LAB — ACETAMINOPHEN LEVEL: Acetaminophen (Tylenol), Serum: <10 ug/mL — ABNORMAL LOW (ref 10–30)

## 2022-07-31 LAB — RESP PANEL BY RT-PCR (RSV, FLU A&B, COVID)  RVPGX2
Influenza A by PCR: NEGATIVE
Influenza B by PCR: NEGATIVE
Resp Syncytial Virus by PCR: NEGATIVE
SARS Coronavirus 2 by RT PCR: NEGATIVE

## 2022-07-31 LAB — ETHANOL: Alcohol, Ethyl (B): <10 mg/dL (ref <10)

## 2022-07-31 LAB — SALICYLATE LEVEL: Salicylate Lvl: <7 mg/dL — ABNORMAL LOW (ref 7.0–30.0)

## 2022-07-31 MED ORDER — CLONAZEPAM 0.5 MG PO TABS: 1.0000 mg | ORAL_TABLET | Freq: Three times a day (TID) | ORAL | Status: DC

## 2022-07-31 MED ORDER — QUETIAPINE FUMARATE 300 MG PO TABS: 800.0000 mg | ORAL_TABLET | Freq: Every day | ORAL | Status: DC

## 2022-07-31 MED ORDER — POTASSIUM CHLORIDE CRYS ER 20 MEQ PO TBCR
80.0000 meq | EXTENDED_RELEASE_TABLET | Freq: Once | ORAL | Status: AC
  Administered 2022-07-31: 80 meq via ORAL
  Filled 2022-07-31: qty 4

## 2022-07-31 MED ORDER — LACOSAMIDE 50 MG PO TABS: 50.0000 mg | ORAL_TABLET | Freq: Two times a day (BID) | ORAL | Status: DC

## 2022-07-31 MED ORDER — PAROXETINE HCL 20 MG PO TABS
20.0000 mg | ORAL_TABLET | Freq: Every day | ORAL | Status: DC
  Filled 2022-07-31: qty 1

## 2022-07-31 NOTE — ED Notes (Addendum)
MD Jacelyn Grip notified of critical potassium level

## 2022-07-31 NOTE — ED Notes (Signed)
Pt father speaking with MD for collateral and then leaving. Belongings kept at hospital per patient request

## 2022-07-31 NOTE — ED Triage Notes (Signed)
Patient arrived via POV with father. Father states for the past three days, patient has been "talking out of her head" with hyper-religious statements and also paranoid behavior and believes someone is after her. Patient denies SI/HI. Anxious but redirectable and cooperative during triage. Alert, oriented to name and place, disoriented to time and situation.

## 2022-07-31 NOTE — ED Notes (Signed)
Pt given food tray and gingerale 

## 2022-07-31 NOTE — ED Notes (Signed)
Pt attempted to provide urine sample- reports unable to pee at this time.

## 2022-07-31 NOTE — ED Notes (Signed)
Patient changed into burgundy scrubs. The following items have been collected from patient and placed in plastic bag with patient label: One wood cross necklace, two silver colored rings, one shell bracelet, one pair of silver colored earrings, one red shirt, one green shirt, one pair of burgundy sweat pants, one pair of underwear.

## 2022-07-31 NOTE — Consult Note (Signed)
[facility]iat[facility] Consult   Reason for Consult:  Psychiatric Evaluation  Referring Physician:  Dr. Jacelyn Grip Patient Identification: [name] MRN:  [id number] Principal Diagnosis: <principal problem not specified> Diagnosis:  Active Problems:   * No active hospital problems. *   Total Time spent with patient: 30 minutes  Subjective:   "They are trying to get me"  HPI:  [name], 42 y.o., female patient with history of schizoaffective disorder and opioid use disorder, seen by this provider; chart reviewed and consulted with Dr. Jacelyn Grip on 07/31/22. At this time patient is a poor historian and states that she has created an alternative universe where every there is trying to kill her.  She denies wanting to kill herself or anyone else.  Beyond these statements, it was not possible to get an assessment. However, per chart review, Dr. Sabas Sous HPI states, "Her boyfriend and her father are worried about her condition and encouraged her to come to the emergency department.  They report that she has been paranoid, thinking that people are after her, auditory hallucinations.  She is vague and guarded but does admit to having auditory hallucinations and suicidal ideation without a plan and denies self-harm or ingestions."  Per triage note, "patient arrived via [place] with father. Father states for the past three days, patient has been "talking out of her head" with hyper-religious statements and also paranoid behavior and believes someone is after her. Patient denies SI/HI. Anxious but redirectable and cooperative during triage. Alert, oriented to name and place, disoriented to time and situation."  During evaluation [name] is sitting on the bed watching tv on approach.  She is alert/oriented x 2 (self and place); calm/cooperative; and mood congruent with affect.  Patient is speaking in a clear tone at moderate volume, and normal pace; with fair eye contact. Her   thought process is incoherent and irrelevant; There is no indication that she is currently responding to internal/external stimuli. But is evident that she is  experiencing delusional thought content.  Her father states that she has not slept in 3 days.  At this time it is unclear if she medication compliant.  She denies suicidal/self-harm/homicidal ideation with this provider.  Patient  presents psychotic, and paranoid.    Recommendation:  Inpatient psychiatric hospitalization   Past Psychiatric History: schizoaffective disorder  Risk to Self:   Risk to Others:   Prior Inpatient Therapy:   Prior Outpatient Therapy:    Past Medical History:  Past Medical History:  Diagnosis Date   Degeneration of lumbar intervertebral disc    Depression    DVT (deep vein thrombosis) in pregnancy    Schizoaffective disorder ([place])    Seizures ([place])    Shoulder pain, right     Past Surgical History:  Procedure Laterality Date   CESAREAN SECTION     X2   Family History:  Family History  Problem Relation Age of Onset   Cancer Mother    Family Psychiatric  History:  Social History:  Social History   Substance and Sexual Activity  Alcohol Use No     Social History   Substance and Sexual Activity  Drug Use No    Social History   Socioeconomic History   Marital status: Divorced    Spouse name: Not on file   Number of children: Not on file   Years of education: Not on file   Highest education level: Not on file  Occupational History   Occupation: unemployed  Tobacco Use   Smoking status: Every Day    Packs/day: 1.50    Types: Cigarettes   Smokeless tobacco: Never  Vaping Use   Vaping Use: Never used  Substance and Sexual Activity   Alcohol use: No   Drug use: No   Sexual activity: Not on file  Other Topics Concern   Not on file  Social History Narrative   Not on file   Social Determinants of Health   Financial Resource Strain: Unknown (11/17/2018)   Overall Financial  Resource Strain (CARDIA)    Difficulty of Paying Living Expenses: Patient refused  Food Insecurity: No Food Insecurity (03/16/2022)   Hunger Vital Sign    Worried About Running Out of Food in the Last Year: Never true    Ran Out of Food in the Last Year: Never true  Transportation Needs: No Transportation Needs (03/16/2022)   PRAPARE - Administrator, Civil Service (Medical): No    Lack of Transportation (Non-Medical): No  Physical Activity: Unknown (11/17/2018)   Exercise Vital Sign    Days of Exercise per Week: Patient refused    Minutes of Exercise per Session: Patient refused  Stress: Unknown (11/17/2018)   [employer] of Occupational Health - Occupational Stress Questionnaire    Feeling of Stress : Patient refused  Social Connections: Unknown (11/17/2018)   Social Connection and Isolation Panel [NHANES]    Frequency of Communication with Friends and Family: Patient refused    Frequency of Social Gatherings with Friends and Family: Patient refused    Attends Religious Services: Patient refused    Active Member of Clubs or Organizations: Patient refused    Attends Banker Meetings: Patient refused    Marital Status: Patient refused   Additional Social History:    Allergies:   Allergies  Allergen Reactions   Latex Rash and Swelling    Other reaction(s): Unknown Skin turns red   Morphine Other (See Comments) and Swelling    Patient states that her reaction is that her skin gets a bit red.   No urticaria or airway difficulty.     Prednisone Other (See Comments)    Other reaction(s): Other (See Comments) Stayed up 30 days when pt. Was on prednisone insomnia   Buprenorphine Hcl-Naloxone Hcl    Duloxetine Other (See Comments)    Unknown Unknown   Ketorolac Tromethamine    Lithium     drool   Tramadol     Other reaction(s): Other (See Comments) Seizures   Poison Ivy Extract [Poison Ivy Extract] Rash   Poison Oak Extract [Poison Oak Extract]  Rash   Sumac Rash    Labs:  Results for orders placed or performed during the hospital encounter of 07/31/22 (from the past 48 hour(s))  Comprehensive metabolic panel     Status: Abnormal   Collection Time: 07/31/22  9:35 PM  Result Value Ref Range   Sodium 139 135 - 145 mmol/L   Potassium 2.6 (LL) 3.5 - 5.1 mmol/L    Comment: CRITICAL RESULT CALLED TO, READ BACK BY AND VERIFIED WITH ANNIE SMITH AT 2245 07/31/2022    Chloride 103 98 - 111 mmol/L   CO2 21 (L) 22 - 32 mmol/L   Glucose, Bld 179 (H) 70 - 99 mg/dL    Comment: Glucose reference range applies only to samples taken after fasting for at least 8 hours.   BUN 9 6 - 20 mg/dL   Creatinine, Ser 6.28 0.44 - 1.00 mg/dL   Calcium  8.6 (L) 8.9 - 10.3 mg/dL   Total Protein 6.9 6.5 - 8.1 g/dL   Albumin 3.7 3.5 - 5.0 g/dL   AST 21 15 - 41 U/L   ALT 16 0 - 44 U/L   Alkaline Phosphatase 69 38 - 126 U/L   Total Bilirubin 0.6 0.3 - 1.2 mg/dL   GFR, Estimated >60 >60 mL/min    Comment: (NOTE) Calculated using the CKD-EPI Creatinine Equation (2021)    Anion gap 15 5 - 15    Comment: Performed at [facility], [address] [place], [place] [address]  Ethanol     Status: None   Collection Time: 07/31/22  9:35 PM  Result Value Ref Range   Alcohol, Ethyl (B) <10 <10 mg/dL    Comment: (NOTE) Lowest detectable limit for serum alcohol is 10 mg/dL.  For medical purposes only. Performed at [facility], [address] [place], [place] [address]   Salicylate level     Status: Abnormal   Collection Time: 07/31/22  9:35 PM  Result Value Ref Range   Salicylate Lvl <0.8 (L) 7.0 - 30.0 mg/dL    Comment: Performed at [facility], [address] [place], [place] [address]  Acetaminophen level     Status: Abnormal   Collection Time: 07/31/22  9:35 PM  Result Value Ref Range   Acetaminophen (Tylenol), Serum <10 (L) 10 - 30 ug/mL    Comment: (NOTE) Therapeutic concentrations vary significantly. A range of  10-30 ug/mL  may be an effective concentration for many patients. However, some  are best treated at concentrations outside of this range. Acetaminophen concentrations >150 ug/mL at 4 hours after ingestion  and >50 ug/mL at 12 hours after ingestion are often associated with  toxic reactions.  Performed at [facility], [address] [place], [place] [address]   cbc     Status: None   Collection Time: 07/31/22  9:35 PM  Result Value Ref Range   WBC 8.9 4.0 - 10.5 K/uL   RBC 4.45 3.87 - 5.11 MIL/uL   Hemoglobin 14.2 12.0 - 15.0 g/dL   HCT 43.0 36.0 - 46.0 %   MCV 96.6 80.0 - 100.0 fL   MCH 31.9 26.0 - 34.0 pg   MCHC 33.0 30.0 - 36.0 g/dL   RDW 13.1 11.5 - 15.5 %   Platelets 275 150 - 400 K/uL   nRBC 0.0 0.0 - 0.2 %    Comment: Performed at [facility], [address] [place],  [address]  Resp panel by RT-PCR (RSV, Flu A&B, Covid) Anterior Nasal Swab     Status: None   Collection Time: 07/31/22  9:36 PM   Specimen: Anterior Nasal Swab  Result Value Ref Range   SARS Coronavirus 2 by RT PCR NEGATIVE NEGATIVE    Comment: (NOTE) SARS-CoV-2 target nucleic acids are NOT DETECTED.  The SARS-CoV-2 RNA is generally detectable in upper respiratory specimens during the acute phase of infection. The lowest concentration of SARS-CoV-2 viral copies this assay can detect is 138 copies/mL. A negative result does not preclude SARS-Cov-2 infection and should not be used as the sole basis for treatment or other patient management decisions. A negative result may occur with  improper specimen collection/handling, submission of specimen other than nasopharyngeal swab, presence of viral mutation(s) within the areas targeted by this assay, and inadequate number of viral copies(<138 copies/mL). A negative result must be combined with clinical observations, patient history, and epidemiological information. The expected  result is Negative.  Fact Sheet for Patients:   EntrepreneurPulse.com.au  Fact Sheet for Healthcare Providers:  IncredibleEmployment.be  This test is no t yet approved or cleared by the [place] FDA and  has been authorized for detection and/or diagnosis of SARS-CoV-2 by FDA under an Emergency Use Authorization (EUA). This EUA will remain  in effect (meaning this test can be used) for the duration of the COVID-19 declaration under Section 564(b)(1) of the Act, 21 U.S.C.section 360bbb-3(b)(1), unless the authorization is terminated  or revoked sooner.       Influenza A by PCR NEGATIVE NEGATIVE   Influenza B by PCR NEGATIVE NEGATIVE    Comment: (NOTE) The Xpert Xpress SARS-CoV-2/FLU/RSV plus assay is intended as an aid in the diagnosis of influenza from Nasopharyngeal swab specimens and should not be used as a sole basis for treatment. Nasal washings and aspirates are unacceptable for Xpert Xpress SARS-CoV-2/FLU/RSV testing.  Fact Sheet for Patients: EntrepreneurPulse.com.au  Fact Sheet for Healthcare Providers: IncredibleEmployment.be  This test is not yet approved or cleared by the [place] FDA and has been authorized for detection and/or diagnosis of SARS-CoV-2 by FDA under an Emergency Use Authorization (EUA). This EUA will remain in effect (meaning this test can be used) for the duration of the COVID-19 declaration under Section 564(b)(1) of the Act, 21 U.S.C. section 360bbb-3(b)(1), unless the authorization is terminated or revoked.     Resp Syncytial Virus by PCR NEGATIVE NEGATIVE    Comment: (NOTE) Fact Sheet for Patients: EntrepreneurPulse.com.au  Fact Sheet for Healthcare Providers: IncredibleEmployment.be  This test is not yet approved or cleared by the [place] FDA and has been authorized for detection and/or diagnosis of SARS-CoV-2 by FDA under an Emergency Use Authorization (EUA).  This EUA will remain in effect (meaning this test can be used) for the duration of the COVID-19 declaration under Section 564(b)(1) of the Act, 21 U.S.C. section 360bbb-3(b)(1), unless the authorization is terminated or revoked.  Performed at [facility], [address] [place], [place] [address]     No current facility-administered medications for this encounter.   Current Outpatient Medications  Medication Sig Dispense Refill   buprenorphine-naloxone (SUBOXONE) 8-2 mg SUBL SL tablet Place 0.5 tablets under the tongue 2 (two) times daily. 30 tablet 0   clonazePAM (KLONOPIN) 1 MG tablet Take 1 tablet (1 mg total) by mouth 3 (three) times daily. 90 tablet 0   famotidine (PEPCID) 20 MG tablet Take 1 tablet (20 mg total) by mouth 2 (two) times daily. 60 tablet 1   lacosamide (VIMPAT) 50 MG TABS tablet Take 1 tablet (50 mg total) by mouth 2 (two) times daily. 60 tablet 1   nicotine (NICODERM CQ - DOSED IN MG/24 HOURS) 21 mg/24hr patch Place 1 patch (21 mg total) onto the skin daily. 28 patch 0   PARoxetine (PAXIL) 20 MG tablet Take 1 tablet (20 mg total) by mouth at bedtime. 30 tablet 1   polyethylene glycol (MIRALAX / GLYCOLAX) 17 g packet Take 17 g by mouth daily. 28 each 1   QUEtiapine (SEROQUEL) 400 MG tablet Take 2 tablets (800 mg total) by mouth at bedtime. 60 tablet 1   sulfamethoxazole-trimethoprim (BACTRIM DS) 800-160 MG tablet Take 1 tablet by mouth every 12 (twelve) hours. 8 tablet 0    Musculoskeletal: Strength & Muscle Tone: within normal limits Gait & Station: normal Patient leans: N/A            Psychiatric Specialty Exam:  Presentation  General  Appearance:  Bizarre  Eye Contact: Fair  Speech: Clear and Coherent  Speech Volume: Normal  Handedness: Right   Mood and Affect  Mood: Dysphoric  Affect: Inappropriate   Thought Process  Thought Processes: Irrevelant  Descriptions of Associations:Tangential  Orientation:Full  (Time, Place and Person)  Thought Content:Illogical  History of Schizophrenia/Schizoaffective disorder:No  Duration of Psychotic Symptoms:No data recorded Hallucinations:Hallucinations: Other (comment) (unable to assess)  Ideas of Reference:Paranoia  Suicidal Thoughts:Suicidal Thoughts: No  Homicidal Thoughts:Homicidal Thoughts: No   Sensorium  Memory: Immediate Poor  Judgment: Impaired  Insight: Lacking   Executive Functions  Concentration: Fair  Attention Span: Fair  Recall: Fair  Fund of Knowledge: Fair  Language: Fair   Psychomotor Activity  Psychomotor Activity: Psychomotor Activity: Normal   Assets  Assets: Desire for Improvement; Financial Resources/Insurance; Housing; Social Support   Sleep  Sleep: Sleep: Poor   Physical Exam: Physical Exam Vitals and nursing note reviewed.  HENT:     Head: Normocephalic and atraumatic.     Nose: Nose normal.     Mouth/Throat:     Mouth: Mucous membranes are dry.  Eyes:     Pupils: Pupils are equal, round, and reactive to light.  Pulmonary:     Effort: Pulmonary effort is normal.  Musculoskeletal:     Cervical back: Normal range of motion.  Skin:    General: Skin is warm and dry.  Neurological:     General: No focal deficit present.     Mental Status: She is oriented to person, place, and time.  Psychiatric:        Attention and Perception: Attention normal.        Mood and Affect: Mood is anxious. Affect is inappropriate.        Speech: Speech is tangential.        Behavior: Behavior is cooperative.        Thought Content: Thought content is paranoid and delusional. Thought content does not include homicidal or suicidal ideation.        Cognition and Memory: Cognition is impaired. Memory is impaired.        Judgment: Judgment is impulsive and inappropriate.    Review of Systems  Psychiatric/Behavioral:  Negative for depression and suicidal ideas. The patient is nervous/anxious and has  insomnia.   All other systems reviewed and are negative.  Blood pressure 120/79, pulse (!) 140, temperature 97.7 F (36.5 C), temperature source Oral, resp. rate 20, height 5\' 1"  (1.549 m), weight 69.9 kg, SpO2 100 %. Body mass index is 29.1 kg/m.  Treatment Plan Summary: Daily contact with patient to assess and evaluate symptoms and progress in treatment, Medication management, and Plan    Disposition: Recommend psychiatric Inpatient admission when medically cleared. Supportive therapy provided about ongoing stressors. Discussed crisis plan, support from social network, calling 911, coming to the Emergency Department, and calling Suicide Hotline.  , NP 07/31/2022 11:28 PM

## 2022-07-31 NOTE — ED Provider Notes (Signed)
11:10 PM  Assumed care at shift change.  Patient under IVC for psychosis.  Found to have potassium level of 2.6.  Given 80 mEq p.o.  Will recheck potassium level, magnesium level and EKG.    EKG Interpretation  Date/Time:  Sunday July 31 2022 23:32:10 EST Ventricular Rate:  122 PR Interval:  140 QRS Duration: 74 QT Interval:  322 QTC Calculation: 458 R Axis:   127 Text Interpretation: Sinus tachycardia Left posterior fascicular block Cannot rule out Inferior infarct , age undetermined T wave abnormality, consider anterolateral ischemia Abnormal ECG When compared with ECG of 17-Mar-2022 16:52, No significant change was found Confirmed by Shanina Kepple ([id number]) on 07/31/2022 11:36:13 PM       12 :52 AM  Pt's repeat potassium low at 3.3.  Magnesium level normal.  Patient now medically cleared.  Patient will be admitted to BMU.   Sable Knoles, Delice Bison, DO 08/01/22 [phone]

## 2022-07-31 NOTE — ED Notes (Signed)
Pt hesitant to take oral potassium, afraid it will hurt her. This RN explained need for medication and patient eventually agreeable to take medication. This RN gave patient paper and crayon per patient request

## 2022-07-31 NOTE — ED Provider Notes (Signed)
[facility] Provider Note    Event Date/Time   First MD Initiated Contact with Patient 07/31/22 2201     (approximate)   History   Altered Mental Status   HPI  [name] is a 42 y.o. female   Past medical history of schizoaffective, seizures, depression who presents emergency department with paranoid behavior and psychosis from her home.  Her boyfriend and her father are worried about her condition and encouraged her to come to the emergency department.  They report that she has been paranoid, thinking that people are after her, auditory hallucinations.  She is vague and guarded but does admit to having auditory hallucinations and suicidal ideation without a plan and denies self-harm or ingestions.   Independent Historian contributed to assessment above: Patient's father who is at bedside to give information as above  External Medical Documents Reviewed: Discharge summary dated September 2023 for psychiatric admission      Physical Exam   Triage Vital Signs: ED Triage Vitals  Enc Vitals Group     BP 07/31/22 2129 120/79     Pulse Rate 07/31/22 2129 (!) 140     Resp 07/31/22 2129 20     Temp 07/31/22 2129 97.7 F (36.5 C)     Temp Source 07/31/22 2129 Oral     SpO2 07/31/22 2129 100 %     Weight 07/31/22 2131 154 lb (69.9 kg)     Height 07/31/22 2131 5\' 1"  (1.549 m)     Head Circumference --      Peak Flow --      Pain Score 07/31/22 2131 0     Pain Loc --      Pain Edu? --      Excl. in [place]? --     Most recent vital signs: Vitals:   07/31/22 2129  BP: 120/79  Pulse: (!) 140  Resp: 20  Temp: 97.7 F (36.5 C)  SpO2: 100%    General: Awake, no distress.  CV:  Good peripheral perfusion.  Resp:  Normal effort.  Abd:  No distention.  Other:  Awake alert cooperative but guarded, no obvious signs of trauma, initial vital signs with a pulse rate of 140, normotensive afebrile.  She is comfortable with clear lungs soft abdomen  skin appears warm well-perfused.   ED Results / Procedures / Treatments   Labs (all labs ordered are listed, but only abnormal results are displayed) Labs Reviewed  COMPREHENSIVE METABOLIC PANEL - Abnormal; Notable for the following components:      Result Value   Potassium 2.6 (*)    CO2 21 (*)    Glucose, Bld 179 (*)    Calcium 8.6 (*)    All other components within normal limits  SALICYLATE LEVEL - Abnormal; Notable for the following components:   Salicylate Lvl <5.2 (*)    All other components within normal limits  ACETAMINOPHEN LEVEL - Abnormal; Notable for the following components:   Acetaminophen (Tylenol), Serum <10 (*)    All other components within normal limits  RESP PANEL BY RT-PCR (RSV, FLU A&B, COVID)  [id number]  ETHANOL  CBC  URINE DRUG SCREEN, QUALITATIVE ([facility] ONLY)  URINALYSIS, ROUTINE W REFLEX MICROSCOPIC  POTASSIUM  MAGNESIUM  POC URINE PREG, ED     I ordered and reviewed the above labs they are notable for a potassium of 2.6 and a bicarb of 21, negative salicylate and Tylenol levels.     PROCEDURES:  Critical Care performed: No  Procedures   MEDICATIONS ORDERED IN ED: Medications  potassium chloride SA (KLOR-CON M) CR tablet 80 mEq (80 mEq Oral Given 07/31/22 2251)   IMPRESSION / MDM / ASSESSMENT AND PLAN / ED COURSE  I reviewed the triage vital signs and the nursing notes.                                Patient's presentation is most consistent with acute presentation with potential threat to life or bodily function.  Differential diagnosis includes, but is not limited to, compensated psychiatric illness, intoxication, metabolic derangement   The patient is on the cardiac monitor to evaluate for evidence of arrhythmia and/or significant heart rate changes.  MDM: Patient with extensive psychiatric history with psychosis and paranoia, put under IVC for the extent of her symptoms as well as suicidal ideation.  Basic labs and toxicology  labs revealed hypokalemia at 2.6 ordered for oral repletion and will obtain follow-up labs including a magnesium level and check EKG.          FINAL CLINICAL IMPRESSION(S) / ED DIAGNOSES   Final diagnoses:  Paranoia (psychosis) ([place])  Suicidal ideation     Rx / DC Orders   ED Discharge Orders     None        Note:  This document was prepared using Dragon voice recognition software and may include unintentional dictation errors.    Lucillie Garfinkel, MD 07/31/22 [phone]

## 2022-08-01 ENCOUNTER — Encounter: Payer: Self-pay | Admitting: Psychiatric/Mental Health

## 2022-08-01 ENCOUNTER — Inpatient Hospital Stay
Admission: RE | Admit: 2022-08-01 | Discharge: 2022-08-11 | DRG: 885 | Disposition: A | Discharge status: Home / Self Care | Payer: Medicare Other | Source: Ambulatory Visit | Attending: Psychiatry | Admitting: Psychiatry

## 2022-08-01 DIAGNOSIS — G8929 Other chronic pain: Secondary | ICD-10-CM | POA: Diagnosis present

## 2022-08-01 DIAGNOSIS — G4700 Insomnia, unspecified: Secondary | ICD-10-CM | POA: Diagnosis present

## 2022-08-01 DIAGNOSIS — F1120 Opioid dependence, uncomplicated: Secondary | ICD-10-CM | POA: Diagnosis present

## 2022-08-01 DIAGNOSIS — Z9151 Personal history of suicidal behavior: Secondary | ICD-10-CM | POA: Diagnosis not present

## 2022-08-01 DIAGNOSIS — G40909 Epilepsy, unspecified, not intractable, without status epilepticus: Secondary | ICD-10-CM | POA: Diagnosis present

## 2022-08-01 DIAGNOSIS — F17210 Nicotine dependence, cigarettes, uncomplicated: Secondary | ICD-10-CM | POA: Diagnosis present

## 2022-08-01 DIAGNOSIS — R569 Unspecified convulsions: Secondary | ICD-10-CM

## 2022-08-01 DIAGNOSIS — E876 Hypokalemia: Secondary | ICD-10-CM | POA: Diagnosis present

## 2022-08-01 DIAGNOSIS — Z20822 Contact with and (suspected) exposure to covid-19: Secondary | ICD-10-CM | POA: Diagnosis present

## 2022-08-01 DIAGNOSIS — F419 Anxiety disorder, unspecified: Secondary | ICD-10-CM | POA: Diagnosis present

## 2022-08-01 DIAGNOSIS — F22 Delusional disorders: Secondary | ICD-10-CM | POA: Diagnosis not present

## 2022-08-01 DIAGNOSIS — F250 Schizoaffective disorder, bipolar type: Secondary | ICD-10-CM | POA: Diagnosis not present

## 2022-08-01 LAB — URINALYSIS, COMPLETE (UACMP) WITH MICROSCOPIC
Bacteria, UA: NONE SEEN
Bilirubin Urine: NEGATIVE
Glucose, UA: NEGATIVE mg/dL
Hgb urine dipstick: NEGATIVE
Ketones, ur: NEGATIVE mg/dL
Leukocytes,Ua: NEGATIVE
Nitrite: NEGATIVE
Protein, ur: 100 mg/dL — AB
Specific Gravity, Urine: 1.041 — ABNORMAL HIGH (ref 1.005–1.030)
pH: 5 (ref 5.0–8.0)

## 2022-08-01 LAB — URINE DRUG SCREEN, QUALITATIVE (ARMC ONLY)
Amphetamines, Ur Screen: NOT DETECTED
Barbiturates, Ur Screen: NOT DETECTED
Benzodiazepine, Ur Scrn: POSITIVE — AB
Cannabinoid 50 Ng, Ur ~~LOC~~: POSITIVE — AB
Cocaine Metabolite,Ur ~~LOC~~: NOT DETECTED
MDMA (Ecstasy)Ur Screen: NOT DETECTED
Methadone Scn, Ur: NOT DETECTED
Opiate, Ur Screen: NOT DETECTED
Phencyclidine (PCP) Ur S: NOT DETECTED
Tricyclic, Ur Screen: POSITIVE — AB

## 2022-08-01 LAB — POTASSIUM: Potassium: 3.3 mmol/L — ABNORMAL LOW (ref 3.5–5.1)

## 2022-08-01 MED ORDER — LACOSAMIDE 50 MG PO TABS
50.0000 mg | ORAL_TABLET | Freq: Two times a day (BID) | ORAL | Status: DC
  Administered 2022-08-01 – 2022-08-11 (×21): 50 mg via ORAL
  Filled 2022-08-01 (×21): qty 1

## 2022-08-01 MED ORDER — IBUPROFEN 600 MG PO TABS
600.0000 mg | ORAL_TABLET | Freq: Four times a day (QID) | ORAL | Status: DC | PRN
  Administered 2022-08-03 – 2022-08-09 (×6): 600 mg via ORAL
  Filled 2022-08-01 (×6): qty 1

## 2022-08-01 MED ORDER — TRAZODONE HCL 50 MG PO TABS
50.0000 mg | ORAL_TABLET | Freq: Every evening | ORAL | Status: DC | PRN
  Administered 2022-08-03 – 2022-08-04 (×2): 50 mg via ORAL
  Filled 2022-08-01 (×3): qty 1

## 2022-08-01 MED ORDER — HYDROXYZINE HCL 25 MG PO TABS
25.0000 mg | ORAL_TABLET | Freq: Three times a day (TID) | ORAL | Status: DC | PRN
  Administered 2022-08-01 – 2022-08-05 (×6): 25 mg via ORAL
  Filled 2022-08-01 (×8): qty 1

## 2022-08-01 MED ORDER — ACETAMINOPHEN 325 MG PO TABS
650.0000 mg | ORAL_TABLET | Freq: Four times a day (QID) | ORAL | Status: DC | PRN
  Administered 2022-08-01 – 2022-08-11 (×21): 650 mg via ORAL
  Filled 2022-08-01 (×22): qty 2

## 2022-08-01 MED ORDER — POTASSIUM CHLORIDE CRYS ER 20 MEQ PO TBCR: 40.0000 meq | EXTENDED_RELEASE_TABLET | Freq: Once | ORAL | Status: DC

## 2022-08-01 MED ORDER — PAROXETINE HCL 20 MG PO TABS: 20.0000 mg | ORAL_TABLET | Freq: Every day | ORAL | Status: DC

## 2022-08-01 MED ORDER — CLONAZEPAM 1 MG PO TABS
1.0000 mg | ORAL_TABLET | Freq: Three times a day (TID) | ORAL | Status: DC
  Administered 2022-08-01 – 2022-08-05 (×14): 1 mg via ORAL
  Filled 2022-08-01 (×14): qty 1

## 2022-08-01 MED ORDER — MAGNESIUM HYDROXIDE 400 MG/5ML PO SUSP
30.0000 mL | Freq: Every day | ORAL | Status: DC | PRN
  Administered 2022-08-05: 30 mL via ORAL
  Filled 2022-08-01: qty 30

## 2022-08-01 MED ORDER — NICOTINE 21 MG/24HR TD PT24
21.0000 mg | MEDICATED_PATCH | Freq: Every day | TRANSDERMAL | Status: DC
  Administered 2022-08-01 – 2022-08-11 (×11): 21 mg via TRANSDERMAL
  Filled 2022-08-01 (×12): qty 1

## 2022-08-01 MED ORDER — QUETIAPINE FUMARATE 200 MG PO TABS
400.0000 mg | ORAL_TABLET | Freq: Every day | ORAL | Status: DC
  Administered 2022-08-01: 400 mg via ORAL
  Filled 2022-08-01: qty 2

## 2022-08-01 MED ORDER — QUETIAPINE FUMARATE 200 MG PO TABS: 800.0000 mg | ORAL_TABLET | Freq: Every day | ORAL | Status: DC

## 2022-08-01 MED ORDER — ALUM & MAG HYDROXIDE-SIMETH 200-200-20 MG/5ML PO SUSP
30.0000 mL | ORAL | Status: DC | PRN
  Administered 2022-08-05: 30 mL via ORAL
  Filled 2022-08-01: qty 30

## 2022-08-01 NOTE — H&P (Signed)
Psychiatric Admission Assessment Adult  Patient Identification: [name] MRN:  [id number] Date of Evaluation:  08/01/2022 Chief Complaint:  Schizoaffective disorder, bipolar type (HCC) [F25.0] Principal Diagnosis: Schizoaffective disorder, bipolar type (HCC) Diagnosis:  Principal Problem:   Schizoaffective disorder, bipolar type (HCC) Active Problems:   Seizure (HCC)   Hypokalemia  History of Present Illness: Patient seen and chart reviewed.  42 year old woman with a known history of chronic mental health problems brought to the emergency room by family.  Family reported that for the past 3 to 5 days patient has been not sleeping and has been "talking out of her head".  On interview the patient is hyperactive and hyperverbal.  Mood is euphoric with lability.  Thoughts are very scattered with flight of ideas but there is a lot of grandiosity talking about how she is going to read design the Internet etc.  Patient was not a easy historian.  Claimed that she had been on her medicine but it sounds like perhaps it was only the Klonopin that she has been taking.  She was not even clear what other medicine she was prescribed currently.  She has not been taking her Suboxone recently.  Patient denies suicidal or homicidal ideation.  Talks about how people are trying to kill her and are out to get her and I suspect is probably been having hallucinations.  Admits to regular cannabis use denies any other drug use. Associated Signs/Symptoms: Depression Symptoms:  insomnia, psychomotor agitation, anxiety, (Hypo) Manic Symptoms:  Delusions, Distractibility, Elevated Mood, Flight of Ideas, Grandiosity, Hallucinations, Impulsivity, Irritable Mood, Labiality of Mood, Anxiety Symptoms:  Excessive Worry, Psychotic Symptoms:  Delusions, Paranoia, PTSD Symptoms: Negative Total Time spent with patient: 45 minutes  Past Psychiatric History: Patient has a long established history of chronic  mental health problems diagnosed as schizoaffective disorder bipolar type.  Last hospitalization here was in September of this past year.  Apparently she has had at least one other hospitalization at [facility] since then.  Patient often presents with psychotic mood symptoms occasionally manic like this off and also depressed.  Has done reasonably well when compliant with medicine has long been treated with higher dose Seroquel.  Patient has a distant past history of concern about overuse of prescription narcotics for which she had been prescribed Suboxone but she prefers to not take that anymore.  She has a history of a diagnosis of seizure disorder.  I reviewed some of those old notes and it seems like it is still a little unclear if she has a definite seizure disorder but usually does stay on anticonvulsant medicine.  Has had 1 prior suicide attempt it was quite a while ago not recently.  Is the patient at risk to self? Yes.    Has the patient been a risk to self in the past 6 months? Yes.    Has the patient been a risk to self within the distant past? Yes.    Is the patient a risk to others? No.  Has the patient been a risk to others in the past 6 months? No.  Has the patient been a risk to others within the distant past? No.   [place] Scale:  Flowsheet Row Admission (Current) from 08/01/2022 [facility] ED from [facility]/20[facility] Department [facility] Admission (Discharged) from [facility]/16/20[facility]  C-SSRS RISK CATEGORY No Risk No Risk No Risk        Prior Inpatient Therapy: Yes.  If yes, describe multiple prior hospitalizations at least 2 last autumn Prior Outpatient Therapy: Yes.   If yes, describe patient is supposed to be following up [facility] where she sees Dr. Fannie Knee.  She said she last saw him probably in November.  Alcohol Screening:   Substance Abuse History in the last 12 months:  Yes.    Consequences of Substance Abuse: I am going to check yes for this although it is not entirely clear to me whether her cannabis use is causing or contributing much to any of the problems.  She denies any other drug abuse.  Drug screen is still pending. Previous Psychotropic Medications: Yes  Psychological Evaluations: Yes  Past Medical History:  Past Medical History:  Diagnosis Date   Degeneration of lumbar intervertebral disc    Depression    DVT (deep vein thrombosis) in pregnancy    Schizoaffective disorder (HCC)    Seizures (HCC)    Shoulder pain, right     Past Surgical History:  Procedure Laterality Date   CESAREAN SECTION     X2   Family History:  Family History  Problem Relation Age of Onset   Cancer Mother    Family Psychiatric  History: None reported Tobacco Screening:  Social History   Tobacco Use  Smoking Status Every Day   Packs/day: 1.50   Types: Cigarettes  Smokeless Tobacco Never    BH Tobacco Counseling     Are you interested in Tobacco Cessation Medications?  No, patient refused Counseled patient on smoking cessation:  Refused/Declined practical counseling Reason Tobacco Screening Not Completed: No value filed.       Social History:  Social History   Substance and Sexual Activity  Alcohol Use No     Social History   Substance and Sexual Activity  Drug Use No    Additional Social History:                           Allergies:   Allergies  Allergen Reactions   Latex Rash and Swelling    Other reaction(s): Unknown Skin turns red   Morphine Other (See Comments) and Swelling    Patient states that her reaction is that her skin gets a bit red.   No urticaria or airway difficulty.     Prednisone Other (See Comments)    Other reaction(s): Other (See Comments) Stayed up 30 days when pt. Was on prednisone insomnia   Buprenorphine Hcl-Naloxone Hcl    Duloxetine Other (See Comments)    Unknown Unknown   Ketorolac Tromethamine     Lithium     drool   Tramadol     Other reaction(s): Other (See Comments) Seizures   Poison Ivy Extract [Poison Ivy Extract] Rash   Poison Oak Extract [Poison Oak Extract] Rash   Sumac Rash   Lab Results:  Results for orders placed or performed during the hospital encounter of 07/31/22 (from the past 48 hour(s))  Comprehensive metabolic panel     Status: Abnormal   Collection Time: 07/31/22  9:35 PM  Result Value Ref Range   Sodium 139 135 - 145 mmol/L   Potassium 2.6 (LL) 3.5 - 5.1 mmol/L    Comment: CRITICAL RESULT CALLED TO, READ BACK BY AND VERIFIED WITH ANNIE SMITH AT 2245 07/31/2022    Chloride 103 98 - 111 mmol/L   CO2 21 (L) 22 - 32 mmol/L   Glucose, Bld 179 (H) 70 - 99 mg/dL  Comment: Glucose reference range applies only to samples taken after fasting for at least 8 hours.   BUN 9 6 - 20 mg/dL   Creatinine, Ser 0.92 0.44 - 1.00 mg/dL   Calcium 8.6 (L) 8.9 - 10.3 mg/dL   Total Protein 6.9 6.5 - 8.1 g/dL   Albumin 3.7 3.5 - 5.0 g/dL   AST 21 15 - 41 U/L   ALT 16 0 - 44 U/L   Alkaline Phosphatase 69 38 - 126 U/L   Total Bilirubin 0.6 0.3 - 1.2 mg/dL   GFR, Estimated >60 >60 mL/min    Comment: (NOTE) Calculated using the CKD-EPI Creatinine Equation (2021)    Anion gap 15 5 - 15    Comment: Performed at [facility], [address] [place], [place] [address]  Ethanol     Status: None   Collection Time: 07/31/22  9:35 PM  Result Value Ref Range   Alcohol, Ethyl (B) <10 <10 mg/dL    Comment: (NOTE) Lowest detectable limit for serum alcohol is 10 mg/dL.  For medical purposes only. Performed at [facility], [address] [place], [place] [address]   Salicylate level     Status: Abnormal   Collection Time: 07/31/22  9:35 PM  Result Value Ref Range   Salicylate Lvl <8.3 (L) 7.0 - 30.0 mg/dL    Comment: Performed at [facility], [address] [place], [place] [address]  Acetaminophen level     Status: Abnormal   Collection  Time: 07/31/22  9:35 PM  Result Value Ref Range   Acetaminophen (Tylenol), Serum <10 (L) 10 - 30 ug/mL    Comment: (NOTE) Therapeutic concentrations vary significantly. A range of 10-30 ug/mL  may be an effective concentration for many patients. However, some  are best treated at concentrations outside of this range. Acetaminophen concentrations >150 ug/mL at 4 hours after ingestion  and >50 ug/mL at 12 hours after ingestion are often associated with  toxic reactions.  Performed at [facility], [address] [place], [place] [address]   cbc     Status: None   Collection Time: 07/31/22  9:35 PM  Result Value Ref Range   WBC 8.9 4.0 - 10.5 K/uL   RBC 4.45 3.87 - 5.11 MIL/uL   Hemoglobin 14.2 12.0 - 15.0 g/dL   HCT 43.0 36.0 - 46.0 %   MCV 96.6 80.0 - 100.0 fL   MCH 31.9 26.0 - 34.0 pg   MCHC 33.0 30.0 - 36.0 g/dL   RDW 13.1 11.5 - 15.5 %   Platelets 275 150 - 400 K/uL   nRBC 0.0 0.0 - 0.2 %    Comment: Performed at [facility], [address] [place], [place] [address]  Magnesium     Status: None   Collection Time: 07/31/22  9:35 PM  Result Value Ref Range   Magnesium 2.0 1.7 - 2.4 mg/dL    Comment: Performed at [facility], [address] [place], [place] [address]  Resp panel by RT-PCR (RSV, Flu A&B, Covid) Anterior Nasal Swab     Status: None   Collection Time: 07/31/22  9:36 PM   Specimen: Anterior Nasal Swab  Result Value Ref Range   SARS Coronavirus 2 by RT PCR NEGATIVE NEGATIVE    Comment: (NOTE) SARS-CoV-2 target nucleic acids are NOT DETECTED.  The SARS-CoV-2 RNA is generally detectable in upper respiratory specimens during the acute phase of infection. The lowest concentration of SARS-CoV-2 viral copies this  assay can detect is 138 copies/mL. A negative result does not preclude SARS-Cov-2 infection and should not be used as the sole basis for treatment or other patient management decisions. A negative result may occur with   improper specimen collection/handling, submission of specimen other than nasopharyngeal swab, presence of viral mutation(s) within the areas targeted by this assay, and inadequate number of viral copies(<138 copies/mL). A negative result must be combined with clinical observations, patient history, and epidemiological information. The expected result is Negative.  Fact Sheet for Patients:  BloggerCourse.com  Fact Sheet for Healthcare Providers:  SeriousBroker.it  This test is no t yet approved or cleared by the [place] FDA and  has been authorized for detection and/or diagnosis of SARS-CoV-2 by FDA under an Emergency Use Authorization (EUA). This EUA will remain  in effect (meaning this test can be used) for the duration of the COVID-19 declaration under Section 564(b)(1) of the Act, 21 U.S.C.section 360bbb-3(b)(1), unless the authorization is terminated  or revoked sooner.       Influenza A by PCR NEGATIVE NEGATIVE   Influenza B by PCR NEGATIVE NEGATIVE    Comment: (NOTE) The Xpert Xpress SARS-CoV-2/FLU/RSV plus assay is intended as an aid in the diagnosis of influenza from Nasopharyngeal swab specimens and should not be used as a sole basis for treatment. Nasal washings and aspirates are unacceptable for Xpert Xpress SARS-CoV-2/FLU/RSV testing.  Fact Sheet for Patients: BloggerCourse.com  Fact Sheet for Healthcare Providers: SeriousBroker.it  This test is not yet approved or cleared by the [place] FDA and has been authorized for detection and/or diagnosis of SARS-CoV-2 by FDA under an Emergency Use Authorization (EUA). This EUA will remain in effect (meaning this test can be used) for the duration of the COVID-19 declaration under Section 564(b)(1) of the Act, 21 U.S.C. section 360bbb-3(b)(1), unless the authorization is terminated or revoked.     Resp  Syncytial Virus by PCR NEGATIVE NEGATIVE    Comment: (NOTE) Fact Sheet for Patients: BloggerCourse.com  Fact Sheet for Healthcare Providers: SeriousBroker.it  This test is not yet approved or cleared by the [place] FDA and has been authorized for detection and/or diagnosis of SARS-CoV-2 by FDA under an Emergency Use Authorization (EUA). This EUA will remain in effect (meaning this test can be used) for the duration of the COVID-19 declaration under Section 564(b)(1) of the Act, 21 U.S.C. section 360bbb-3(b)(1), unless the authorization is terminated or revoked.  Performed at [facility], [address] [place], [place] [address]   Potassium     Status: Abnormal   Collection Time: 08/01/22 12:15 AM  Result Value Ref Range   Potassium 3.3 (L) 3.5 - 5.1 mmol/L    Comment: Performed at [facility], [address] [place], [place] [address]    Blood Alcohol level:  Lab Results  Component Value Date   [facility] <10 07/31/2022   ETH <10 03/16/2022    Metabolic Disorder Labs:  Lab Results  Component Value Date   HGBA1C 5.2 03/17/2022   MPG 102.54 03/17/2022   MPG 88 03/25/2016   No results found for: "PROLACTIN" Lab Results  Component Value Date   CHOL 282 (H) 03/17/2022   TRIG 208 (H) 03/17/2022   HDL 34 (L) 03/17/2022   CHOLHDL 8.3 03/17/2022   VLDL 42 (H) 03/17/2022   LDLCALC 206 (H) 03/17/2022   LDLCALC 116 (H) 03/25/2016    Current Medications: Current Facility-Administered Medications  Medication Dose Route Frequency Provider Last Rate Last Admin  acetaminophen (TYLENOL) tablet 650 mg  650 mg Oral Q6H PRN Deloria Lair, NP   650 mg at 08/01/22 0805   alum & mag hydroxide-simeth (MAALOX/MYLANTA) 200-200-20 MG/5ML suspension 30 mL  30 mL Oral Q4H PRN Deloria Lair, NP       clonazePAM [name]) tablet 1 mg  1 mg Oral TID Deloria Lair, NP   1 mg at 08/01/22 6295   hydrOXYzine  (ATARAX) tablet 25 mg  25 mg Oral TID PRN Deloria Lair, NP   25 mg at 08/01/22 2841   lacosamide (VIMPAT) tablet 50 mg  50 mg Oral BID Deloria Lair, NP   50 mg at 08/01/22 3244   magnesium hydroxide (MILK OF MAGNESIA) suspension 30 mL  30 mL Oral Daily PRN Deloria Lair, NP       nicotine (NICODERM CQ - dosed in mg/24 hours) patch 21 mg  21 mg Transdermal Daily Analucia Hush T, MD   21 mg at 08/01/22 0851   QUEtiapine (SEROQUEL) tablet 400 mg  400 mg Oral QHS Rafal Archuleta, Madie Reno, MD       traZODone (DESYREL) tablet 50 mg  50 mg Oral QHS PRN Deloria Lair, NP       PTA Medications: Medications Prior to Admission  Medication Sig Dispense Refill Last Dose   Buprenorphine HCl-Naloxone HCl 8-2 MG FILM Place 1 Film under the tongue 2 (two) times daily.      buprenorphine-naloxone (SUBOXONE) 8-2 mg SUBL SL tablet Place 0.5 tablets under the tongue 2 (two) times daily. 30 tablet 0    clonazePAM (KLONOPIN) 1 MG tablet Take 1 tablet (1 mg total) by mouth 3 (three) times daily. 90 tablet 0    famotidine (PEPCID) 20 MG tablet Take 1 tablet (20 mg total) by mouth 2 (two) times daily. 60 tablet 1    lacosamide (VIMPAT) 50 MG TABS tablet Take 1 tablet (50 mg total) by mouth 2 (two) times daily. 60 tablet 1    nicotine (NICODERM CQ - DOSED IN MG/24 HOURS) 21 mg/24hr patch Place 1 patch (21 mg total) onto the skin daily. 28 patch 0    PARoxetine (PAXIL) 20 MG tablet Take 1 tablet (20 mg total) by mouth at bedtime. 30 tablet 1    polyethylene glycol (MIRALAX / GLYCOLAX) 17 g packet Take 17 g by mouth daily. 28 each 1    QUEtiapine (SEROQUEL) 400 MG tablet Take 2 tablets (800 mg total) by mouth at bedtime. (Patient taking differently: Take 400-800 mg by mouth at bedtime.) 60 tablet 1    sulfamethoxazole-trimethoprim (BACTRIM DS) 800-160 MG tablet Take 1 tablet by mouth every 12 (twelve) hours. 8 tablet 0     Musculoskeletal: Strength & Muscle Tone: within normal limits Gait & Station: normal Patient  leans: N/A            Psychiatric Specialty Exam:  Presentation  General Appearance:  Bizarre  Eye Contact: Fair  Speech: Clear and Coherent  Speech Volume: Normal  Handedness: Right   Mood and Affect  Mood: Dysphoric  Affect: Inappropriate   Thought Process  Thought Processes: Irrevelant  Duration of Psychotic Symptoms: Patient has a long of intermittent psychotic disorders going back over a decade.  Current manic episode seems to have been prominent only for a week or less Past Diagnosis of Schizophrenia or Psychoactive disorder: No  Descriptions of Associations:Tangential  Orientation:Full (Time, Place and Person)  Thought Content:Illogical  Hallucinations:Hallucinations: Other (comment) (unable to assess)  Ideas  of Reference:Paranoia  Suicidal Thoughts:Suicidal Thoughts: No  Homicidal Thoughts:Homicidal Thoughts: No   Sensorium  Memory: Immediate Poor  Judgment: Impaired  Insight: Lacking   Executive Functions  Concentration: Fair  Attention Span: Fair  Recall: Harts of Knowledge: Fair  Language: Fair   Psychomotor Activity  Psychomotor Activity: Psychomotor Activity: Normal   Assets  Assets: Desire for Improvement; Financial Resources/Insurance; Housing; Social Support   Sleep  Sleep: Sleep: Poor    Physical Exam: Physical Exam Vitals and nursing note reviewed.  Constitutional:      Appearance: Normal appearance.  HENT:     Head: Normocephalic and atraumatic.     Mouth/Throat:     Pharynx: Oropharynx is clear.  Eyes:     Pupils: Pupils are equal, round, and reactive to light.  Cardiovascular:     Rate and Rhythm: Normal rate and regular rhythm.  Pulmonary:     Effort: Pulmonary effort is normal.     Breath sounds: Normal breath sounds.  Abdominal:     General: Abdomen is flat.     Palpations: Abdomen is soft.  Musculoskeletal:        General: Normal range of motion.  Skin:     General: Skin is warm and dry.  Neurological:     General: No focal deficit present.     Mental Status: She is alert. Mental status is at baseline.  Psychiatric:        Attention and Perception: She is inattentive.        Mood and Affect: Mood is elated. Affect is labile and inappropriate.        Speech: Speech is rapid and pressured and tangential.        Behavior: Behavior is agitated.        Thought Content: Thought content is paranoid and delusional.        Cognition and Memory: Cognition is impaired. Memory is impaired.        Judgment: Judgment is impulsive and inappropriate.    Review of Systems  Constitutional: Negative.   HENT: Negative.    Eyes: Negative.   Respiratory: Negative.    Cardiovascular: Negative.   Gastrointestinal: Negative.   Musculoskeletal: Negative.   Skin: Negative.   Neurological: Negative.   Psychiatric/Behavioral:  Positive for hallucinations. Negative for depression, substance abuse and suicidal ideas. The patient is nervous/anxious and has insomnia.    Blood pressure 111/73, pulse 82, temperature 97.9 F (36.6 C), temperature source Oral, resp. rate 19, height 5\' 1"  (1.549 m), weight 69.9 kg, SpO2 97 %. Body mass index is 29.1 kg/m.  Treatment Plan Summary: Daily contact with patient to assess and evaluate symptoms and progress in treatment, Medication management, and Plan labs reviewed.  She also had a low potassium on presentation which was later rechecked and was coming back to normal.  We need to get a urinalysis and a pregnancy test.  Check lipid panel and hemoglobin A1c.  Restart medication including Seroquel initially 400 mg at night with plan to probably titrate up.  I am not going to restart the Paxil given how manic she is looking right now.  Patient prefers not to take the Suboxone and so that will not be continued.  Patient met with treatment team today.  Continue daily assessment and inclusion in therapeutic groups and activities.   Contacted father already who is probably going to stay in touch with her.  Observation Level/Precautions:  15 minute checks  Laboratory:  Chemistry Profile  Psychotherapy:  Medications:    Consultations:    Discharge Concerns:    Estimated LOS:  Other:     Physician Treatment Plan for Primary Diagnosis: Schizoaffective disorder, bipolar type (HCC) Long Term Goal(s): Improvement in symptoms so as ready for discharge  Short Term Goals: Ability to verbalize feelings will improve, Ability to demonstrate self-control will improve, and Ability to identify and develop effective coping behaviors will improve  Physician Treatment Plan for Secondary Diagnosis: Principal Problem:   Schizoaffective disorder, bipolar type (HCC) Active Problems:   Seizure (HCC)   Hypokalemia  Long Term Goal(s): Improvement in symptoms so as ready for discharge  Short Term Goals: Ability to maintain clinical measurements within normal limits will improve and Compliance with prescribed medications will improve  I certify that inpatient services furnished can reasonably be expected to improve the patient's condition.    Mordecai Rasmussen, MD 2/5/202410:48 AM

## 2022-08-01 NOTE — Plan of Care (Signed)
  Problem: Nutrition: Goal: Adequate nutrition will be maintained Outcome: Progressing   Problem: Coping: Goal: Level of anxiety will decrease Outcome: Progressing   Problem: Education: Goal: Knowledge of [place] [facility] Education information/materials will improve Outcome: Progressing   Problem: Education: Goal: Will be free of psychotic symptoms Outcome: Not Progressing   Problem: Education: Goal: Mental status will improve Outcome: Not Progressing

## 2022-08-01 NOTE — Progress Notes (Signed)
Pt denies SI/HI/AVH and verbally agrees to approach staff if these become apparent or before harming themselves/others. Rates depression 10/10. Rates anxiety 10/10. Rates pain 7/10. Pt is hyper-religious and stated she had a plan to hurt herself and that was to keep the internet safe from children and take the B-word away. "Why can't people just be kind?" "Do you forgive me for my sins?" Pt has been pacing throughout the day. Pt will talk delusionally non-stop. Pt has asked for her medication multiple times throughout the day and is needing re-direction frequently. Pt states that she has not been able to urinate for a few days but was able to pee in a cup for testing. Urine was dark. Pt encouraged to drink fluids. Pt is needy. Scheduled medications administered to pt, per MD orders. RN provided support and encouragement to pt. Q15 min safety checks implemented and continued. Pt safe on the unit. RN will continue to monitor and intervene as needed.  08/01/22 0900  Psych Admission Type (Psych Patients Only)  Admission Status Involuntary  Psychosocial Assessment  Patient Complaints Anxiety;Depression;Suspiciousness  Eye Contact Fair  Facial Expression Animated;Worried  Affect Anxious;Depressed;Euphoric;Preoccupied  Armed forces operational officer Activity Slow  Appearance/Hygiene In scrubs  Behavior Characteristics Cooperative;Anxious  Mood Depressed;Anxious;Suspicious;Euphoric;Preoccupied  Aggressive Behavior  Effect No apparent injury  Thought Process  Coherency Disorganized;Tangential;Flight of ideas  Content Delusions;Preoccupation;Religiosity;Paranoia  Delusions Grandeur;Paranoid;Religious  Perception Hallucinations  Hallucination Auditory  Judgment Poor  Confusion Mild  Danger to Self  Current suicidal ideation? Denies  Danger to Others  Danger to Others None reported or observed

## 2022-08-01 NOTE — ED Notes (Signed)
Pt yelling out randomly and appears to be talking to people that are not present in room. When this RN went to check on patient, patient states "God?" And looks to sky. When patient realizes this RN at the door, pt states "no I'm okay".

## 2022-08-01 NOTE — BHH Suicide Risk Assessment (Signed)
[facility] Admission Suicide Risk Assessment   Nursing information obtained from:  Patient Demographic factors:  Caucasian, Unemployed Current Mental Status:  Belief that plan would result in death Loss Factors:    Historical Factors:    Risk Reduction Factors:     Total Time spent with patient: 45 minutes Principal Problem: Schizoaffective disorder, bipolar type ([place]) Diagnosis:  Principal Problem:   Schizoaffective disorder, bipolar type ([place]) Active Problems:   Seizure ([place])   Hypokalemia  Subjective Data: Patient seen and chart reviewed.  42 year old woman with a history of schizoaffective disorder brought to the hospital by family out of concern for worsening symptoms.  Patient is hyperverbal very animated with labile affect euphoric affect and pressured speech.  Very grandiose.  Denies suicidal or homicidal thought.  No report of her having been suicidal or violent in the last several days.  Family and patient agree that her current symptoms have been present probably about 5 days.  Patient admits to ongoing cannabis use denies other drug use.  She says she has been compliant with medicine but when pressed for details it is not clear whether she has really been taking them all or not.  Continued Clinical Symptoms:    The "Alcohol Use Disorders Identification Test", Guidelines for Use in Primary Care, Second Edition.  World Pharmacologist [facility]). Score between 0-7:  no or low risk or alcohol related problems. Score between 8-15:  moderate risk of alcohol related problems. Score between 16-19:  high risk of alcohol related problems. Score 20 or above:  warrants further diagnostic evaluation for alcohol dependence and treatment.   CLINICAL FACTORS:   Bipolar Disorder:   Mixed State Schizophrenia:   Paranoid or undifferentiated type   Musculoskeletal: Strength & Muscle Tone: within normal limits Gait & Station: normal Patient leans: N/A  Psychiatric Specialty  Exam:  Presentation  General Appearance:  Bizarre  Eye Contact: Fair  Speech: Clear and Coherent  Speech Volume: Normal  Handedness: Right   Mood and Affect  Mood: Dysphoric  Affect: Inappropriate   Thought Process  Thought Processes: Irrevelant  Descriptions of Associations:Tangential  Orientation:Full (Time, Place and Person)  Thought Content:Illogical  History of Schizophrenia/Schizoaffective disorder:No  Duration of Psychotic Symptoms:No data recorded Hallucinations:Hallucinations: Other (comment) (unable to assess)  Ideas of Reference:Paranoia  Suicidal Thoughts:Suicidal Thoughts: No  Homicidal Thoughts:Homicidal Thoughts: No   Sensorium  Memory: Immediate Poor  Judgment: Impaired  Insight: Lacking   Executive Functions  Concentration: Fair  Attention Span: Fair  Recall: [place] of Knowledge: Fair  Language: Fair   Psychomotor Activity  Psychomotor Activity: Psychomotor Activity: Normal   Assets  Assets: Desire for Improvement; Financial Resources/Insurance; Housing; Social Support   Sleep  Sleep: Sleep: Poor    Physical Exam: Physical Exam Vitals and nursing note reviewed.  Constitutional:      Appearance: Normal appearance.  HENT:     Head: Normocephalic and atraumatic.     Mouth/Throat:     Pharynx: Oropharynx is clear.  Eyes:     Pupils: Pupils are equal, round, and reactive to light.  Cardiovascular:     Rate and Rhythm: Normal rate and regular rhythm.  Pulmonary:     Effort: Pulmonary effort is normal.     Breath sounds: Normal breath sounds.  Abdominal:     General: Abdomen is flat.     Palpations: Abdomen is soft.  Musculoskeletal:        General: Normal range of motion.  Skin:    General: Skin  is warm and dry.  Neurological:     General: No focal deficit present.     Mental Status: She is alert. Mental status is at baseline.  Psychiatric:        Attention and Perception: She is  inattentive.        Mood and Affect: Mood normal. Affect is labile and inappropriate.        Speech: Speech is rapid and pressured and tangential.        Behavior: Behavior is agitated. Behavior is not aggressive.        Thought Content: Thought content is paranoid and delusional.        Cognition and Memory: Cognition is impaired. Memory is impaired.        Judgment: Judgment is impulsive and inappropriate.    Review of Systems  Constitutional: Negative.   HENT: Negative.    Eyes: Negative.   Respiratory: Negative.    Cardiovascular: Negative.   Gastrointestinal: Negative.   Musculoskeletal: Negative.   Skin: Negative.   Neurological: Negative.   Psychiatric/Behavioral:  Negative for depression, hallucinations, substance abuse and suicidal ideas. The patient is nervous/anxious and has insomnia.    Blood pressure 111/73, pulse 82, temperature 97.9 F (36.6 C), temperature source Oral, resp. rate 19, height 5\' 1"  (1.549 m), weight 69.9 kg, SpO2 97 %. Body mass index is 29.1 kg/m.   COGNITIVE FEATURES THAT CONTRIBUTE TO RISK:  Loss of executive function and Polarized thinking    SUICIDE RISK:   Minimal: No identifiable suicidal ideation.  Patients presenting with no risk factors but with morbid ruminations; may be classified as minimal risk based on the severity of the depressive symptoms  PLAN OF CARE: Continue 15-minute checks.  Restart medication based on what is previously been useful.  Labs reviewed.  We are going to go ahead and make sure we get a pregnancy test and a drug screen.  Engage patient in individual and group therapy and ongoing assessment of dangerousness prior to discharge planning  I certify that inpatient services furnished can reasonably be expected to improve the patient's condition.   Alethia Berthold, MD 08/01/2022, 10:45 AM

## 2022-08-01 NOTE — Group Note (Signed)
LCSW Group Therapy Note  Group Date: 08/01/2022 Start Time: 1300 End Time: 1400   Type of Therapy and Topic:  Group Therapy: Using "I" Statements  Participation Level:  Active  Description of Group:  Patients were asked to provide details of some interpersonal conflicts they have experienced. Patients were then educated about "I" statements, communication which focuses on feelings or views of the speaker rather than what the other person is doing. T group members were asked to reflect on past conflicts and to provide specific examples for utilizing "I" statements.  Therapeutic Goals:  Patients will verbalize understanding of ineffective communication and effective communication. Patients will be able to empathize with whom they are having conflict. Patients will practice effective communication in the form of "I" statements.  Summary of Patient Progress:   Patient was present for entirety of group, unable to stay on topic and required constant redirection in order benefit group as a whole.   Therapeutic Modalities:   Cognitive Behavioral Therapy Solution-Focused Therapy  Durenda Hurt, Marlinda Mike 08/01/2022  2:52 PM

## 2022-08-01 NOTE — Progress Notes (Signed)
Patient took HS medications walked from the med room and started yelling saying "I want my medications you did not give me Seroquel Patient came back to the med room being verbally aggressive towards Staff. Security and other Staff came. Patient ran to the Telephone stating she wanted to call 911 to tell them that she is not getting her medication. Patient sat on the floor yelling and screaming. Patient was able to be redirected and went to her bedroom. Provider notified no new orders.   Support and encouragement provided. Patient in her room. Q 15 minutes safety

## 2022-08-01 NOTE — BHH Suicide Risk Assessment (Signed)
[facility] INPATIENT:  Family/Significant Other Suicide Prevention Education  Suicide Prevention Education:  Contact Attempts: Charlsie Merles, partner, [phone] has been identified by the patient as the family member/significant other with whom the patient will be residing, and identified as the person(s) who will aid the patient in the event of a mental health crisis.  With written consent from the patient, two attempts were made to provide suicide prevention education, prior to and/or following the patient's discharge.  We were unsuccessful in providing suicide prevention education.  A suicide education pamphlet was given to the patient to share with family/significant other.  Date and time of first attempt: 08/01/2022 12:17 PM  Date and time of second attempt: CSW team will make additional attempt to reach family/friends to complete SPE.   Durenda Hurt 08/01/2022, 12:16 PM

## 2022-08-01 NOTE — BHH Group Notes (Signed)
[place] Group Notes:  (Nursing/MHT/Case Management/Adjunct)  Date:  08/01/2022  Time:  9:07 PM  Type of Therapy:  Group Therapy  Participation Level:  Active  Participation Quality:  Intrusive  Affect:  Labile  Cognitive:  Disorganized and Delusional  Insight:  Lacking and Limited  Engagement in Group:  Off Topic  Modes of Intervention:  Discussion  Summary of Progress/Problems:  Ladona Mow 08/01/2022, 9:07 PM
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
## 2022-08-01 NOTE — BH IP Treatment Plan (Signed)
Interdisciplinary Treatment and Diagnostic Plan Update  08/01/2022 Time of Session: 0830 [name] MRN: [id number]  Principal Diagnosis: Schizoaffective disorder, bipolar type [facility])  Secondary Diagnoses: Principal Problem:   Schizoaffective disorder, bipolar type ([place])   Current Medications:  Current Facility-Administered Medications  Medication Dose Route Frequency Provider Last Rate Last Admin   acetaminophen (TYLENOL) tablet 650 mg  650 mg Oral Q6H PRN Deloria Lair, NP   650 mg at 08/01/22 0805   alum & mag hydroxide-simeth (MAALOX/MYLANTA) 200-200-20 MG/5ML suspension 30 mL  30 mL Oral Q4H PRN Deloria Lair, NP       clonazePAM [name]) tablet 1 mg  1 mg Oral TID Deloria Lair, NP   1 mg at 08/01/22 0865   hydrOXYzine (ATARAX) tablet 25 mg  25 mg Oral TID PRN Deloria Lair, NP   25 mg at 08/01/22 7846   lacosamide (VIMPAT) tablet 50 mg  50 mg Oral BID Deloria Lair, NP   50 mg at 08/01/22 9629   magnesium hydroxide (MILK OF MAGNESIA) suspension 30 mL  30 mL Oral Daily PRN Deloria Lair, NP       nicotine (NICODERM CQ - dosed in mg/24 hours) patch 21 mg  21 mg Transdermal Daily Clapacs, Madie Reno, MD   21 mg at 08/01/22 5284   PARoxetine (PAXIL) tablet 20 mg  20 mg Oral QHS Dixon, Rashaun M, NP       QUEtiapine (SEROQUEL) tablet 800 mg  800 mg Oral QHS Dixon, Rashaun M, NP       traZODone (DESYREL) tablet 50 mg  50 mg Oral QHS PRN Deloria Lair, NP       PTA Medications: Medications Prior to Admission  Medication Sig Dispense Refill Last Dose   Buprenorphine HCl-Naloxone HCl 8-2 MG FILM Place 1 Film under the tongue 2 (two) times daily.      buprenorphine-naloxone (SUBOXONE) 8-2 mg SUBL SL tablet Place 0.5 tablets under the tongue 2 (two) times daily. 30 tablet 0    clonazePAM (KLONOPIN) 1 MG tablet Take 1 tablet (1 mg total) by mouth 3 (three) times daily. 90 tablet 0    famotidine (PEPCID) 20 MG tablet Take 1 tablet (20 mg total) by mouth 2 (two)  times daily. 60 tablet 1    lacosamide (VIMPAT) 50 MG TABS tablet Take 1 tablet (50 mg total) by mouth 2 (two) times daily. 60 tablet 1    nicotine (NICODERM CQ - DOSED IN MG/24 HOURS) 21 mg/24hr patch Place 1 patch (21 mg total) onto the skin daily. 28 patch 0    PARoxetine (PAXIL) 20 MG tablet Take 1 tablet (20 mg total) by mouth at bedtime. 30 tablet 1    polyethylene glycol (MIRALAX / GLYCOLAX) 17 g packet Take 17 g by mouth daily. 28 each 1    QUEtiapine (SEROQUEL) 400 MG tablet Take 2 tablets (800 mg total) by mouth at bedtime. (Patient taking differently: Take 400-800 mg by mouth at bedtime.) 60 tablet 1    sulfamethoxazole-trimethoprim (BACTRIM DS) 800-160 MG tablet Take 1 tablet by mouth every 12 (twelve) hours. 8 tablet 0     Patient Stressors:    Patient Strengths:    Treatment Modalities: Medication Management, Group therapy, Case management,  1 to 1 session with clinician, Psychoeducation, Recreational therapy.   Physician Treatment Plan for Primary Diagnosis: Schizoaffective disorder, bipolar type ([place]) Long Term Goal(s):     Short Term Goals:    Medication Management: Evaluate patient's  response, side effects, and tolerance of medication regimen.  Therapeutic Interventions: 1 to 1 sessions, Unit Group sessions and Medication administration.  Evaluation of Outcomes: Progressing  Physician Treatment Plan for Secondary Diagnosis: Principal Problem:   Schizoaffective disorder, bipolar type ([place])  Long Term Goal(s):     Short Term Goals:       Medication Management: Evaluate patient's response, side effects, and tolerance of medication regimen.  Therapeutic Interventions: 1 to 1 sessions, Unit Group sessions and Medication administration.  Evaluation of Outcomes: Progressing   RN Treatment Plan for Primary Diagnosis: Schizoaffective disorder, bipolar type ([place]) Long Term Goal(s): Knowledge of disease and therapeutic regimen to maintain health will improve  Short  Term Goals: Ability to remain free from injury will improve, Ability to verbalize frustration and anger appropriately will improve, Ability to demonstrate self-control, Ability to participate in decision making will improve, Ability to verbalize feelings will improve, Ability to disclose and discuss suicidal ideas, Ability to identify and develop effective coping behaviors will improve, and Compliance with prescribed medications will improve  Medication Management: RN will administer medications as ordered by provider, will assess and evaluate patient's response and provide education to patient for prescribed medication. RN will report any adverse and/or side effects to prescribing provider.  Therapeutic Interventions: 1 on 1 counseling sessions, Psychoeducation, Medication administration, Evaluate responses to treatment, Monitor vital signs and CBGs as ordered, Perform/monitor CIWA, COWS, AIMS and Fall Risk screenings as ordered, Perform wound care treatments as ordered.  Evaluation of Outcomes: Progressing   LCSW Treatment Plan for Primary Diagnosis: Schizoaffective disorder, bipolar type ([place]) Long Term Goal(s): Safe transition to appropriate next level of care at discharge, Engage patient in therapeutic group addressing interpersonal concerns.  Short Term Goals: Engage patient in aftercare planning with referrals and resources, Increase social support, Increase ability to appropriately verbalize feelings, Increase emotional regulation, Facilitate acceptance of mental health diagnosis and concerns, Facilitate patient progression through stages of change regarding substance use diagnoses and concerns, Identify triggers associated with mental health/substance abuse issues, and Increase skills for wellness and recovery  Therapeutic Interventions: Assess for all discharge needs, 1 to 1 time with Social worker, Explore available resources and support systems, Assess for adequacy in community support  network, Educate family and significant other(s) on suicide prevention, Complete Psychosocial Assessment, Interpersonal group therapy.  Evaluation of Outcomes: Progressing   Progress in Treatment: Attending groups: Yes. Participating in groups: Yes. Taking medication as prescribed: Yes. Toleration medication: Yes. Family/Significant other contact made: No, will contact:  CSW to obtain consent to reach family/friend.  Patient understands diagnosis: No. Discussing patient identified problems/goals with staff: Yes. Medical problems stabilized or resolved: Yes. Denies suicidal/homicidal ideation: Yes. Issues/concerns per patient self-inventory: Yes. Other: none  New problem(s) identified: No, Describe:  none  New Short Term/Long Term Goal(s): Patient to work towards  elimination of symptoms of psychosis, medication management for mood stabilization; development of comprehensive mental wellness plan.  Patient Goals:  Patient states their goal for treatment is to "want to create a new world so people do not have to go out and steal  . . . Dortha Schwalbe."  Discharge Plan or Barriers: No psychosocial barriers identified at this time, patient to return to place of residence when appropriate for discharge.   Reason for Continuation of Hospitalization: Delusions  Medication stabilization  Estimated Length of Stay: 1-7 days   Last 3 [place] Suicide Severity Risk Score: Flowsheet Row Admission (Current) from 08/01/2022 in [place] ED from 07/31/2022 in  [place] Admission (Discharged) from 03/16/2022 in [place] No Risk No Risk No Risk      Scribe for Treatment Team: Larose Kells 08/01/2022 9:48 AM

## 2022-08-02 DIAGNOSIS — F250 Schizoaffective disorder, bipolar type: Secondary | ICD-10-CM | POA: Diagnosis not present

## 2022-08-02 LAB — HEMOGLOBIN A1C
Hgb A1c MFr Bld: 5.2 % (ref 4.8–5.6)
Mean Plasma Glucose: 102.54 mg/dL

## 2022-08-02 LAB — LIPID PANEL
Cholesterol: 192 mg/dL (ref 0–200)
HDL: 37 mg/dL — ABNORMAL LOW (ref >40)
LDL Cholesterol: 101 mg/dL — ABNORMAL HIGH (ref 0–99)
Total CHOL/HDL Ratio: 5.2 RATIO
Triglycerides: 270 mg/dL — ABNORMAL HIGH (ref <150)
VLDL: 54 mg/dL — ABNORMAL HIGH (ref 0–40)

## 2022-08-02 MED ORDER — INFLUENZA VAC SPLIT QUAD 0.5 ML IM SUSY
0.5000 mL | PREFILLED_SYRINGE | INTRAMUSCULAR | Status: DC
  Filled 2022-08-02: qty 0.5

## 2022-08-02 MED ORDER — QUETIAPINE FUMARATE 200 MG PO TABS
800.0000 mg | ORAL_TABLET | Freq: Every day | ORAL | Status: DC
  Administered 2022-08-02 – 2022-08-10 (×9): 800 mg via ORAL
  Filled 2022-08-02 (×9): qty 4

## 2022-08-02 NOTE — Progress Notes (Signed)
[facility] MD Progress Note  08/02/2022 12:40 PM [name]  MRN:  [id number] Subjective: Follow-up 42 year old woman with schizoaffective disorder.  Patient slept last night.  She is a little bit calmer today but still hyperactive hyperverbal and psychotic.  Talking about inventing new worlds and that sort of thing.  Talking about cleaning up the Internet.  Patient is requesting to have her Seroquel increased to her previous dose of 800 mg.  Seems to have tolerated the 400 without difficulty.  Vitals stable.  Labs reviewed.  Has some abnormalities to her lipid panel.  Urinalysis does not appear to be infected. Principal Problem: Schizoaffective disorder, bipolar type (HCC) Diagnosis: Principal Problem:   Schizoaffective disorder, bipolar type (HCC) Active Problems:   Seizure (HCC)   Hypokalemia  Total Time spent with patient: 30 minutes  Past Psychiatric History: Past history of chronic schizoaffective disorder bipolar type chronic psychiatric problems history of seizure disorder.  Past Medical History:  Past Medical History:  Diagnosis Date   Degeneration of lumbar intervertebral disc    Depression    DVT (deep vein thrombosis) in pregnancy    Schizoaffective disorder (HCC)    Seizures (HCC)    Shoulder pain, right     Past Surgical History:  Procedure Laterality Date   CESAREAN SECTION     X2   Family History:  Family History  Problem Relation Age of Onset   Cancer Mother    Family Psychiatric  History: See previous Social History:  Social History   Substance and Sexual Activity  Alcohol Use No     Social History   Substance and Sexual Activity  Drug Use Yes   Types: Marijuana   Comment: reports daily cannabis use    Social History   Socioeconomic History   Marital status: Divorced    Spouse name: Not on file   Number of children: Not on file   Years of education: Not on file   Highest education level: Not on file  Occupational History   Occupation:  unemployed  Tobacco Use   Smoking status: Every Day    Packs/day: 1.50    Types: Cigarettes   Smokeless tobacco: Never  Vaping Use   Vaping Use: Never used  Substance and Sexual Activity   Alcohol use: No   Drug use: Yes    Types: Marijuana    Comment: reports daily cannabis use   Sexual activity: Yes    Partners: Male  Other Topics Concern   Not on file  Social History Narrative   Not on file   Social Determinants of Health   Financial Resource Strain: Unknown (11/17/2018)   Overall Financial Resource Strain (CARDIA)    Difficulty of Paying Living Expenses: Patient refused  Food Insecurity: No Food Insecurity (07/31/2022)   Hunger Vital Sign    Worried About Running Out of Food in the Last Year: Never true    Ran Out of Food in the Last Year: Never true  Transportation Needs: Unmet Transportation Needs (08/01/2022)   PRAPARE - Transportation    Lack of Transportation (Medical): No    Lack of Transportation (Non-Medical): Yes  Physical Activity: Unknown (11/17/2018)   Exercise Vital Sign    Days of Exercise per Week: Patient refused    Minutes of Exercise per Session: Patient refused  Stress: Unknown (11/17/2018)   [employer] of Occupational Health - Occupational Stress Questionnaire    Feeling of Stress : Patient refused  Social Connections: Unknown (11/17/2018)   Social Connection and  Isolation Panel [NHANES]    Frequency of Communication with Friends and Family: Patient refused    Frequency of Social Gatherings with Friends and Family: Patient refused    Attends Religious Services: Patient refused    Marine scientist or Organizations: Patient refused    Attends Music therapist: Patient refused    Marital Status: Patient refused   Additional Social History:                         Sleep: Fair  Appetite:  Fair  Current Medications: Current Facility-Administered Medications  Medication Dose Route Frequency Provider Last Rate Last  Admin   acetaminophen (TYLENOL) tablet 650 mg  650 mg Oral Q6H PRN Deloria Lair, NP   650 mg at 08/02/22 0834   alum & mag hydroxide-simeth (MAALOX/MYLANTA) 200-200-20 MG/5ML suspension 30 mL  30 mL Oral Q4H PRN Deloria Lair, NP       clonazePAM (KLONOPIN) tablet 1 mg  1 mg Oral TID Deloria Lair, NP   1 mg at 08/02/22 1135   hydrOXYzine (ATARAX) tablet 25 mg  25 mg Oral TID PRN Deloria Lair, NP   25 mg at 08/01/22 0806   ibuprofen (ADVIL) tablet 600 mg  600 mg Oral Q6H PRN Emmitte Surgeon, Madie Reno, MD       [START ON 08/03/2022] influenza vac split quadrivalent PF (FLUARIX) injection 0.5 mL  0.5 mL Intramuscular Tomorrow-1000 Marlet Korte T, MD       lacosamide (VIMPAT) tablet 50 mg  50 mg Oral BID Doren Custard, Rashaun M, NP   50 mg at 08/02/22 0758   magnesium hydroxide (MILK OF MAGNESIA) suspension 30 mL  30 mL Oral Daily PRN Deloria Lair, NP       nicotine (NICODERM CQ - dosed in mg/24 hours) patch 21 mg  21 mg Transdermal Daily Naasir Carreira, Madie Reno, MD   21 mg at 08/02/22 0758   QUEtiapine (SEROQUEL) tablet 800 mg  800 mg Oral QHS Larenz Frasier, Madie Reno, MD       traZODone (DESYREL) tablet 50 mg  50 mg Oral QHS PRN Deloria Lair, NP        Lab Results:  Results for orders placed or performed during the hospital encounter of 08/01/22 (from the past 48 hour(s))  Urinalysis, Complete w Microscopic -Urine, Clean Catch     Status: Abnormal   Collection Time: 08/01/22 10:44 AM  Result Value Ref Range   Color, Urine AMBER (A) YELLOW    Comment: BIOCHEMICALS MAY BE AFFECTED BY COLOR   APPearance HAZY (A) CLEAR   Specific Gravity, Urine 1.041 (H) 1.005 - 1.030   pH 5.0 5.0 - 8.0   Glucose, UA NEGATIVE NEGATIVE mg/dL   Hgb urine dipstick NEGATIVE NEGATIVE   Bilirubin Urine NEGATIVE NEGATIVE   Ketones, ur NEGATIVE NEGATIVE mg/dL   Protein, ur 100 (A) NEGATIVE mg/dL   Nitrite NEGATIVE NEGATIVE   Leukocytes,Ua NEGATIVE NEGATIVE   RBC / HPF 0-5 0 - 5 RBC/hpf   WBC, UA 0-5 0 - 5 WBC/hpf   Bacteria,  UA NONE SEEN NONE SEEN   Squamous Epithelial / HPF 0-5 0 - 5 /HPF   Mucus PRESENT     Comment: Performed at [facility], [address] [place], [place] [address]  Urine Drug Screen, Qualitative ([place] only)     Status: Abnormal   Collection Time: 08/01/22 10:45 AM  Result Value Ref Range   Tricyclic,  Ur Screen POSITIVE (A) NONE DETECTED   Amphetamines, Ur Screen NONE DETECTED NONE DETECTED   MDMA (Ecstasy)Ur Screen NONE DETECTED NONE DETECTED   Cocaine Metabolite,Ur [place] NONE DETECTED NONE DETECTED   Opiate, Ur Screen NONE DETECTED NONE DETECTED   Phencyclidine (PCP) Ur S NONE DETECTED NONE DETECTED   Cannabinoid 50 Ng, Ur [place] POSITIVE (A) NONE DETECTED   Barbiturates, Ur Screen NONE DETECTED NONE DETECTED   Benzodiazepine, Ur Scrn POSITIVE (A) NONE DETECTED   Methadone Scn, Ur NONE DETECTED NONE DETECTED    Comment: (NOTE) Tricyclics + metabolites, urine    Cutoff 1000 ng/mL Amphetamines + metabolites, urine  Cutoff 1000 ng/mL MDMA (Ecstasy), urine              Cutoff 500 ng/mL Cocaine Metabolite, urine          Cutoff 300 ng/mL Opiate + metabolites, urine        Cutoff 300 ng/mL Phencyclidine (PCP), urine         Cutoff 25 ng/mL Cannabinoid, urine                 Cutoff 50 ng/mL Barbiturates + metabolites, urine  Cutoff 200 ng/mL Benzodiazepine, urine              Cutoff 200 ng/mL Methadone, urine                   Cutoff 300 ng/mL  The urine drug screen provides only a preliminary, unconfirmed analytical test result and should not be used for non-medical purposes. Clinical consideration and professional judgment should be applied to any positive drug screen result due to possible interfering substances. A more specific alternate chemical method must be used in order to obtain a confirmed analytical result. Gas chromatography / mass spectrometry (GC/MS) is the preferred confirm atory method. Performed at [facility], [address] [place], [place]  [address]   Lipid panel     Status: Abnormal   Collection Time: 08/02/22 10:57 AM  Result Value Ref Range   Cholesterol 192 0 - 200 mg/dL   Triglycerides 270 (H) <150 mg/dL   HDL 37 (L) >40 mg/dL   Total CHOL/HDL Ratio 5.2 RATIO   VLDL 54 (H) 0 - 40 mg/dL   LDL Cholesterol 101 (H) 0 - 99 mg/dL    Comment:        Total Cholesterol/HDL:CHD Risk Coronary Heart Disease Risk Table                     Men   Women  1/2 Average Risk   3.4   3.3  Average Risk       5.0   4.4  2 X Average Risk   9.6   7.1  3 X Average Risk  23.4   11.0        Use the calculated Patient Ratio above and the CHD Risk Table to determine the patient's CHD Risk.        ATP III CLASSIFICATION (LDL):  <100     mg/dL   Optimal  100-129  mg/dL   Near or Above                    Optimal  130-159  mg/dL   Borderline  160-189  mg/dL   High  >190     mg/dL   Very High Performed at [facility], [address] [place],  [address]     Blood  Alcohol level:  Lab Results  Component Value Date   ETH <10 07/31/2022   ETH <10 03/16/2022    Metabolic Disorder Labs: Lab Results  Component Value Date   HGBA1C 5.2 03/17/2022   MPG 102.54 03/17/2022   MPG 88 03/25/2016   No results found for: "PROLACTIN" Lab Results  Component Value Date   CHOL 192 08/02/2022   TRIG 270 (H) 08/02/2022   HDL 37 (L) 08/02/2022   CHOLHDL 5.2 08/02/2022   VLDL 54 (H) 08/02/2022   LDLCALC 101 (H) 08/02/2022   LDLCALC 206 (H) 03/17/2022    Physical Findings: AIMS: Facial and Oral Movements Muscles of Facial Expression: None, normal Lips and Perioral Area: None, normal Jaw: None, normal Tongue: None, normal,Extremity Movements Upper (arms, wrists, hands, fingers): None, normal Lower (legs, knees, ankles, toes): None, normal, Trunk Movements Neck, shoulders, hips: None, normal, Overall Severity Severity of abnormal movements (highest score from questions above): None, normal Incapacitation due to abnormal  movements: None, normal Patient's awareness of abnormal movements (rate only patient's report): No Awareness, Dental Status Current problems with teeth and/or dentures?: No Does patient usually wear dentures?: No  CIWA:    COWS:     Musculoskeletal: Strength & Muscle Tone: within normal limits Gait & Station: normal Patient leans: N/A  Psychiatric Specialty Exam:  Presentation  General Appearance:  Bizarre  Eye Contact: Fair  Speech: Clear and Coherent  Speech Volume: Normal  Handedness: Right   Mood and Affect  Mood: Dysphoric  Affect: Inappropriate   Thought Process  Thought Processes: Irrevelant  Descriptions of Associations:Tangential  Orientation:Full (Time, Place and Person)  Thought Content:Illogical  History of Schizophrenia/Schizoaffective disorder:No  Duration of Psychotic Symptoms:No data recorded Hallucinations:No data recorded Ideas of Reference:Paranoia  Suicidal Thoughts:No data recorded Homicidal Thoughts:No data recorded  Sensorium  Memory: Immediate Poor  Judgment: Impaired  Insight: Lacking   Executive Functions  Concentration: Fair  Attention Span: Fair  Recall: Fair  Fund of Knowledge: Fair  Language: Fair   Psychomotor Activity  Psychomotor Activity:No data recorded  Assets  Assets: Desire for Improvement; Financial Resources/Insurance; Housing; Social Support   Sleep  Sleep:No data recorded   Physical Exam: Physical Exam Vitals and nursing note reviewed.  Constitutional:      Appearance: Normal appearance.  HENT:     Head: Normocephalic and atraumatic.     Mouth/Throat:     Pharynx: Oropharynx is clear.  Eyes:     Pupils: Pupils are equal, round, and reactive to light.  Cardiovascular:     Rate and Rhythm: Normal rate and regular rhythm.  Pulmonary:     Effort: Pulmonary effort is normal.     Breath sounds: Normal breath sounds.  Abdominal:     General: Abdomen is flat.      Palpations: Abdomen is soft.  Musculoskeletal:        General: Normal range of motion.  Skin:    General: Skin is warm and dry.  Neurological:     General: No focal deficit present.     Mental Status: She is alert. Mental status is at baseline.  Psychiatric:        Attention and Perception: She is inattentive.        Mood and Affect: Mood is elated. Affect is labile and inappropriate.        Speech: Speech is rapid and pressured and tangential.        Behavior: Behavior is agitated.        Thought Content: Thought  content is paranoid.        Judgment: Judgment is inappropriate.    Review of Systems  Constitutional: Negative.   HENT: Negative.    Eyes: Negative.   Respiratory: Negative.    Cardiovascular: Negative.   Gastrointestinal: Negative.   Musculoskeletal: Negative.   Skin: Negative.   Neurological: Negative.   Psychiatric/Behavioral:  Negative for depression, hallucinations, substance abuse and suicidal ideas. The patient is nervous/anxious and has insomnia.    Blood pressure (!) 150/63, pulse 79, temperature 97.7 F (36.5 C), temperature source Oral, resp. rate 19, height 5\' 1"  (1.549 m), weight 69.9 kg, SpO2 97 %. Body mass index is 29.1 kg/m.   Treatment Plan Summary: Daily contact with patient to assess and evaluate symptoms and progress in treatment, Medication management, and Plan increase dose of Seroquel to 800 mg at night.  Psychoeducation and support to patient.  Continue including in activities for daily assessment and encouragement.  Alethia Berthold, MD 08/02/2022, 12:40 PM

## 2022-08-02 NOTE — Group Note (Signed)
[facility] LCSW Group Therapy Note    Group Date: 08/02/2022 Start Time: 1400 End Time: 1500  Type of Therapy and Topic:  Group Therapy:  Overcoming Obstacles  Participation Level:  [facility] PARTICIPATION LEVEL: None   Description of Group:   In this group patients will be encouraged to explore what they see as obstacles to their own wellness and recovery. They will be guided to discuss their thoughts, feelings, and behaviors related to these obstacles. The group will process together ways to cope with barriers, with attention given to specific choices patients can make. Each patient will be challenged to identify changes they are motivated to make in order to overcome their obstacles. This group will be process-oriented, with patients participating in exploration of their own experiences as well as giving and receiving support and challenge from other group members.  Therapeutic Goals: 1. Patient will identify personal and current obstacles as they relate to admission. 2. Patient will identify barriers that currently interfere with their wellness or overcoming obstacles.  3. Patient will identify feelings, thought process and behaviors related to these barriers. 4. Patient will identify two changes they are willing to make to overcome these obstacles:    Summary of Patient Progress Patient came into the group room but while there spent most of her time slumped over, sleeping. After awhile, pt apologized and said she needed to go back to her room to sleep. Pt left group and did not return.   Therapeutic Modalities:   Cognitive Behavioral Therapy Solution Focused Therapy Motivational Interviewing Relapse Prevention Therapy   Shirl Harris, LCSW

## 2022-08-02 NOTE — Progress Notes (Signed)
D- Patient alert and oriented x 2-3. Affect blunted/mood anxious. Denies SI/ HI/ AVH. Complains of back pain 5/10. Tylenol PRN administered with no relief 5/10. A- Scheduled medications administered to patient, per MD orders. Support and encouragement provided.  Routine safety checks conducted every 15 minutes.  Patient informed to notify staff with problems or concerns. R- No adverse drug reactions noted. Patient delusional with depersonalized conversation, tangential,disorganized. Patient compliant with medications and treatment plan. Patient receptive and cooperative. Patient interacts well with others on the unit.  Patient contracts for safety and remains safe on the unit at this time.

## 2022-08-02 NOTE — Progress Notes (Signed)
Patient requesting for Klonopin and Seroquel stated she takes more Seroquel than the one ordered Patient educated and advised that the Provider had prescribed her 400 mg, also offered to take atarax since she did not have Klonopin at this time for her anxiety Patient refused Atarax took PO Seroquel. Support and encouragement provided.

## 2022-08-03 DIAGNOSIS — F250 Schizoaffective disorder, bipolar type: Secondary | ICD-10-CM | POA: Diagnosis not present

## 2022-08-03 NOTE — Progress Notes (Signed)
Patient at the nurses station station requesting medication for a headache.  Stated that she has migraines and rates her headache at 10/10 a this encounter.  Received PRN pain medication for symptoms.  Encouraged her to coe to staff with any concerns.    C Butler-Nicholson, LPN

## 2022-08-03 NOTE — Progress Notes (Signed)
[facility] MD Progress Note  08/03/2022 4:30 PM [name]  MRN:  [id number] Subjective: Follow-up patient with schizoaffective bipolar type.  Patient was sitting calmly in the day room but when I approached her and talk to her she became very animated once again having pressured speech delusional thinking grandiosity.  She has not been violent or aggressive here and largely has been cooperative with treatment.  Slept better last night.  Tolerated medicine without difficulty. Principal Problem: Schizoaffective disorder, bipolar type (HCC) Diagnosis: Principal Problem:   Schizoaffective disorder, bipolar type ([place]) Active Problems:   Seizure ([place])   Hypokalemia  Total Time spent with patient: 30 minutes  Past Psychiatric History: Past history of schizoaffective bipolar type  Past Medical History:  Past Medical History:  Diagnosis Date   Degeneration of lumbar intervertebral disc    Depression    DVT (deep vein thrombosis) in pregnancy    Schizoaffective disorder (HCC)    Seizures (HCC)    Shoulder pain, right     Past Surgical History:  Procedure Laterality Date   CESAREAN SECTION     X2   Family History:  Family History  Problem Relation Age of Onset   Cancer Mother    Family Psychiatric  History: See previous Social History:  Social History   Substance and Sexual Activity  Alcohol Use No     Social History   Substance and Sexual Activity  Drug Use Yes   Types: Marijuana   Comment: reports daily cannabis use    Social History   Socioeconomic History   Marital status: Divorced    Spouse name: Not on file   Number of children: Not on file   Years of education: Not on file   Highest education level: Not on file  Occupational History   Occupation: unemployed  Tobacco Use   Smoking status: Every Day    Packs/day: 1.50    Types: Cigarettes   Smokeless tobacco: Never  Vaping Use   Vaping Use: Never used  Substance and Sexual Activity   Alcohol use: No    Drug use: Yes    Types: Marijuana    Comment: reports daily cannabis use   Sexual activity: Yes    Partners: Male  Other Topics Concern   Not on file  Social History Narrative   Not on file   Social Determinants of Health   Financial Resource Strain: Unknown (11/17/2018)   Overall Financial Resource Strain (CARDIA)    Difficulty of Paying Living Expenses: Patient refused  Food Insecurity: No Food Insecurity (07/31/2022)   Hunger Vital Sign    Worried About Running Out of Food in the Last Year: Never true    Ran Out of Food in the Last Year: Never true  Transportation Needs: Unmet Transportation Needs (08/01/2022)   PRAPARE - Transportation    Lack of Transportation (Medical): No    Lack of Transportation (Non-Medical): Yes  Physical Activity: Unknown (11/17/2018)   Exercise Vital Sign    Days of Exercise per Week: Patient refused    Minutes of Exercise per Session: Patient refused  Stress: Unknown (11/17/2018)   [employer] [place]    Feeling of Stress : Patient refused  Social Connections: Unknown (11/17/2018)   Social Connection and Isolation Panel [NHANES]    Frequency of Communication with Friends and Family: Patient refused    Frequency of Social Gatherings with Friends and Family: Patient refused    Attends Religious Services: Patient refused  Active Member of Clubs or Organizations: Patient refused    Attends Archivist Meetings: Patient refused    Marital Status: Patient refused   Additional Social History:                         Sleep: Fair  Appetite:  Fair  Current Medications: Current Facility-Administered Medications  Medication Dose Route Frequency Provider Last Rate Last Admin   acetaminophen (TYLENOL) tablet 650 mg  650 mg Oral Q6H PRN Deloria Lair, NP   650 mg at 08/03/22 1311   alum & mag hydroxide-simeth (MAALOX/MYLANTA) 200-200-20 MG/5ML suspension 30 mL  30 mL Oral Q4H PRN  Deloria Lair, NP       clonazePAM (KLONOPIN) tablet 1 mg  1 mg Oral TID Deloria Lair, NP   1 mg at 08/03/22 1208   hydrOXYzine (ATARAX) tablet 25 mg  25 mg Oral TID PRN Deloria Lair, NP   25 mg at 08/03/22 0554   ibuprofen (ADVIL) tablet 600 mg  600 mg Oral Q6H PRN Omarri Eich, Madie Reno, MD       influenza vac split quadrivalent PF (FLUARIX) injection 0.5 mL  0.5 mL Intramuscular Tomorrow-1000 Payten Beaumier T, MD       lacosamide (VIMPAT) tablet 50 mg  50 mg Oral BID Doren Custard, Rashaun M, NP   50 mg at 08/03/22 0735   magnesium hydroxide (MILK OF MAGNESIA) suspension 30 mL  30 mL Oral Daily PRN Deloria Lair, NP       nicotine (NICODERM CQ - dosed in mg/24 hours) patch 21 mg  21 mg Transdermal Daily Emanuel Campos, Madie Reno, MD   21 mg at 08/03/22 0736   QUEtiapine (SEROQUEL) tablet 800 mg  800 mg Oral QHS Kavari Parrillo T, MD   800 mg at 08/02/22 2053   traZODone (DESYREL) tablet 50 mg  50 mg Oral QHS PRN Deloria Lair, NP        Lab Results:  Results for orders placed or performed during the hospital encounter of 08/01/22 (from the past 48 hour(s))  Lipid panel     Status: Abnormal   Collection Time: 08/02/22 10:57 AM  Result Value Ref Range   Cholesterol 192 0 - 200 mg/dL   Triglycerides 270 (H) <150 mg/dL   HDL 37 (L) >40 mg/dL   Total CHOL/HDL Ratio 5.2 RATIO   VLDL 54 (H) 0 - 40 mg/dL   LDL Cholesterol 101 (H) 0 - 99 mg/dL    Comment:        Total Cholesterol/HDL:CHD Risk Coronary Heart Disease Risk Table                     Men   Women  1/2 Average Risk   3.4   3.3  Average Risk       5.0   4.4  2 X Average Risk   9.6   7.1  3 X Average Risk  23.4   11.0        Use the calculated Patient Ratio above and the CHD Risk Table to determine the patient's CHD Risk.        ATP III CLASSIFICATION (LDL):  <100     mg/dL   Optimal  100-129  mg/dL   Near or Above                    Optimal  130-159  mg/dL   Borderline  160-189  mg/dL   High  >272     mg/dL   Very High Performed at  [facility], [address] [place], [place] [address]   Hemoglobin A1c     Status: None   Collection Time: 08/02/22 10:57 AM  Result Value Ref Range   Hgb A1c MFr Bld 5.2 4.8 - 5.6 %    Comment: (NOTE) Pre diabetes:          5.7%-6.4%  Diabetes:              >6.4%  Glycemic control for   <7.0% adults with diabetes    Mean Plasma Glucose 102.54 mg/dL    Comment: Performed at [facility], [address] [place], [place] [address]    Blood Alcohol level:  Lab Results  Component Value Date   [facility] <10 07/31/2022   ETH <10 03/16/2022    Metabolic Disorder Labs: Lab Results  Component Value Date   HGBA1C 5.2 08/02/2022   MPG 102.54 08/02/2022   MPG 102.54 03/17/2022   No results found for: "PROLACTIN" Lab Results  Component Value Date   CHOL 192 08/02/2022   TRIG 270 (H) 08/02/2022   HDL 37 (L) 08/02/2022   CHOLHDL 5.2 08/02/2022   VLDL 54 (H) 08/02/2022   LDLCALC 101 (H) 08/02/2022   LDLCALC 206 (H) 03/17/2022    Physical Findings: AIMS: Facial and Oral Movements Muscles of Facial Expression: None, normal Lips and Perioral Area: None, normal Jaw: None, normal Tongue: None, normal,Extremity Movements Upper (arms, wrists, hands, fingers): None, normal Lower (legs, knees, ankles, toes): None, normal, Trunk Movements Neck, shoulders, hips: None, normal, Overall Severity Severity of abnormal movements (highest score from questions above): None, normal Incapacitation due to abnormal movements: None, normal Patient's awareness of abnormal movements (rate only patient's report): No Awareness, Dental Status Current problems with teeth and/or dentures?: No Does patient usually wear dentures?: No  CIWA:    COWS:     Musculoskeletal: Strength & Muscle Tone: within normal limits Gait & Station: normal Patient leans: N/A  Psychiatric Specialty Exam:  Presentation  General Appearance:  Bizarre  Eye Contact: Fair  Speech: Clear and  Coherent  Speech Volume: Normal  Handedness: Right   Mood and Affect  Mood: Dysphoric  Affect: Inappropriate   Thought Process  Thought Processes: Irrevelant  Descriptions of Associations:Tangential  Orientation:Full (Time, Place and Person)  Thought Content:Illogical  History of Schizophrenia/Schizoaffective disorder:No  Duration of Psychotic Symptoms:No data recorded Hallucinations:No data recorded Ideas of Reference:Paranoia  Suicidal Thoughts:No data recorded Homicidal Thoughts:No data recorded  Sensorium  Memory: Immediate Poor  Judgment: Impaired  Insight: Lacking   Executive Functions  Concentration: Fair  Attention Span: Fair  Recall: Fair  Fund of Knowledge: Fair  Language: Fair   Psychomotor Activity  Psychomotor Activity:No data recorded  Assets  Assets: Desire for Improvement; Financial Resources/Insurance; Housing; Social Support   Sleep  Sleep:No data recorded   Physical Exam: Physical Exam Vitals and nursing note reviewed.  Constitutional:      Appearance: Normal appearance.  HENT:     Head: Normocephalic and atraumatic.     Mouth/Throat:     Pharynx: Oropharynx is clear.  Eyes:     Pupils: Pupils are equal, round, and reactive to light.  Cardiovascular:     Rate and Rhythm: Normal rate and regular rhythm.  Pulmonary:     Effort: Pulmonary effort is normal.     Breath sounds: Normal breath sounds.  Abdominal:  General: Abdomen is flat.     Palpations: Abdomen is soft.  Musculoskeletal:        General: Normal range of motion.  Skin:    General: Skin is warm and dry.  Neurological:     General: No focal deficit present.     Mental Status: She is alert. Mental status is at baseline.  Psychiatric:        Attention and Perception: She is inattentive.        Mood and Affect: Mood normal. Affect is labile and inappropriate.        Speech: Speech is rapid and pressured and tangential.        Behavior:  Behavior is agitated.        Thought Content: Thought content is delusional.    Review of Systems  Constitutional: Negative.   HENT: Negative.    Eyes: Negative.   Respiratory: Negative.    Cardiovascular: Negative.   Gastrointestinal: Negative.   Musculoskeletal: Negative.   Skin: Negative.   Neurological: Negative.   Psychiatric/Behavioral:  Positive for hallucinations. Negative for depression, substance abuse and suicidal ideas. The patient is nervous/anxious.    Blood pressure (!) 102/90, pulse (!) 106, temperature 98.4 F (36.9 C), temperature source Oral, resp. rate 19, height 5\' 1"  (1.549 m), weight 69.9 kg, SpO2 99 %. Body mass index is 29.1 kg/m.   Treatment Plan Summary: Medication management and Plan continue with current medication.  Encourage group attendance.  Supportive counseling.  Still psychotic still not at baseline.  Requires further time in the hospital for improvement.  Alethia Berthold, MD 08/03/2022, 4:30 PM

## 2022-08-03 NOTE — Progress Notes (Signed)
Patient received QHS medication. Denies si/hi/avh.  Depression and anxiety endorsed and requesting Klonopin. Staff explained that the medication is scheduled and had already been given today,  She then started asking for he seizure medication.  Again staff explained that she has already taken the medication fort the day.  Patient appeared to be satisfied with th explanation/    C Butler-Nicholson, LPN

## 2022-08-03 NOTE — Progress Notes (Signed)
Patient at the nurses station reporting that she can not sleep.  Made her aware that she had Trazodone to sleep but she initially refused and was asking for Keppra.  Staff explained that she is no longer on Keppra, but is now taking Vimpat and explained that she had already taken the medication for the day.  Staff offered support and encouragement  to take the Trazodone and try to get some sleep. After some resistance she took the Trazodone.  Will continue to monitor with q15 minute safety checks.     C Butler-Nicholson, LPN

## 2022-08-03 NOTE — Group Note (Signed)
[facility] LCSW Group Therapy Note   Group Date: 08/03/2022 Start Time: 1310 End Time: 1400   Type of Therapy/Topic:  Group Therapy:  Emotion Regulation  Participation Level:  None   Mood:  Description of Group:    The purpose of this group is to assist patients in learning to regulate negative emotions and experience positive emotions. Patients will be guided to discuss ways in which they have been vulnerable to their negative emotions. These vulnerabilities will be juxtaposed with experiences of positive emotions or situations, and patients challenged to use positive emotions to combat negative ones. Special emphasis will be placed on coping with negative emotions in conflict situations, and patients will process healthy conflict resolution skills.  Therapeutic Goals: Patient will identify two positive emotions or experiences to reflect on in order to balance out negative emotions:  Patient will label two or more emotions that they find the most difficult to experience:  Patient will be able to demonstrate positive conflict resolution skills through discussion or role plays:   Summary of Patient Progress: Patient was present in group.  Patient struggled with processing group and following the discussions.  Patient required a lot of redirection due to interrupting discussion to discuss the whiteboard, her significant other and her drawings.     Therapeutic Modalities:   Cognitive Behavioral Therapy Feelings Identification Dialectical Behavioral Therapy   Rozann Lesches, LCSW

## 2022-08-03 NOTE — Plan of Care (Signed)
D: Patient alert and oriented. Patient denies pain. Patient denies depression. Patient endorses anxiety. Patient denies SI/HI/AVH. Patient interactive with peers on the unit.   A: Scheduled medications administered to patient, per MD orders.  Support and encouragement provided to patient.  Q15 minute safety checks maintained.   R: Patient compliant with medication administration and treatment plan. No adverse drug reactions noted. Patient remains safe on the unit at this time. Problem: Education: Goal: Knowledge of [facility] Education information/materials will improve Outcome: Progressing Goal: Verbalization of understanding the information provided will improve Outcome: Progressing   Problem: Health Behavior/Discharge Planning: Goal: Compliance with treatment plan for underlying cause of condition will improve Outcome: Progressing   Problem: Physical Regulation: Goal: Ability to maintain clinical measurements within normal limits will improve Outcome: Progressing   Problem: Safety: Goal: Periods of time without injury will increase Outcome: Progressing

## 2022-08-03 NOTE — Progress Notes (Signed)
   08/02/22 2100  Psych Admission Type (Psych Patients Only)  Admission Status Involuntary  Psychosocial Assessment  Patient Complaints Anxiety  Eye Contact Intense  Facial Expression Wide-eyed  Affect Apprehensive;Irritable;Preoccupied  Speech Loud;Tangential  Interaction Arrogant;Demanding;Hostile  Motor Activity Fidgety;Pacing  Appearance/Hygiene Disheveled  Behavior Characteristics Cooperative  Mood Labile  Aggressive Behavior  Targets Other (Comment)  Type of Behavior Striking out  Effect No apparent injury  Thought Process  Coherency Circumstantial;Disorganized;Flight of ideas;Tangential  Content Blaming others;Delusions;Preoccupation  Delusions Paranoid  Perception Hallucinations  Hallucination Auditory  Judgment Impaired  Confusion WDL  Danger to Self  Current suicidal ideation? Denies  Danger to Others  Danger to Others None reported or observed   Patient compliant with medication irritable but redirectable Denies SI/VH and endorses AH. Q 15 minutes safety checks ongoing without self harm gestures. Support and encouragement provided.

## 2022-08-04 DIAGNOSIS — F250 Schizoaffective disorder, bipolar type: Secondary | ICD-10-CM | POA: Diagnosis not present

## 2022-08-04 NOTE — [facility] Group Notes (Signed)
[facility] Group Notes:  (Nursing/MHT/Case Management/Adjunct)  Date:  08/04/2022  Time:  8:34 PM  Type of Therapy:   Wrap up  Participation Level:  Did Not Attend  Summary of Progress/Problems:  [name] 08/04/2022, 8:34 PM

## 2022-08-04 NOTE — Group Note (Signed)
LCSW Group Therapy Note  Group Date: 08/04/2022 Start Time: 1300 End Time: 1400   Type of Therapy and Topic:  Group Therapy - Healthy vs Unhealthy Coping Skills  Participation Level:  Did Not Attend   Description of Group The focus of this group was to determine what unhealthy coping techniques typically are used by group members and what healthy coping techniques would be helpful in coping with various problems. Patients were guided in becoming aware of the differences between healthy and unhealthy coping techniques. Patients were asked to identify 2-3 healthy coping skills they would like to learn to use more effectively.  Therapeutic Goals Patients learned that coping is what human beings do all day long to deal with various situations in their lives Patients defined and discussed healthy vs unhealthy coping techniques Patients identified their preferred coping techniques and identified whether these were healthy or unhealthy Patients determined 2-3 healthy coping skills they would like to become more familiar with and use more often. Patients provided support and ideas to each other   Summary of Patient Progress:  Patient did not attend group despite encouraged participation.   Therapeutic Modalities Cognitive Behavioral Therapy Motivational Interviewing  Larose Kells 08/04/2022  3:49 PM

## 2022-08-04 NOTE — Plan of Care (Signed)
  Problem: Education: Goal: Knowledge of General Education information will improve Description: Including pain rating scale, medication(s)/side effects and non-pharmacologic comfort measures Outcome: Progressing   Problem: Health Behavior/Discharge Planning: Goal: Ability to manage health-related needs will improve Outcome: Progressing   Problem: Clinical Measurements: Goal: Ability to maintain clinical measurements within normal limits will improve Outcome: Progressing Goal: Will remain free from infection Outcome: Progressing Goal: Diagnostic test results will improve Outcome: Progressing Goal: Respiratory complications will improve Outcome: Progressing Goal: Cardiovascular complication will be avoided Outcome: Progressing   Problem: Safety: Goal: Periods of time without injury will increase Outcome: Progressing   Problem: Physical Regulation: Goal: Ability to maintain clinical measurements within normal limits will improve Outcome: Progressing   Problem: Health Behavior/Discharge Planning: Goal: Identification of resources available to assist in meeting health care needs will improve Outcome: Progressing Goal: Compliance with treatment plan for underlying cause of condition will improve Outcome: Progressing   Problem: Coping: Goal: Ability to verbalize frustrations and anger appropriately will improve Outcome: Progressing Goal: Ability to demonstrate self-control will improve Outcome: Progressing   Problem: Activity: Goal: Interest or engagement in activities will improve Outcome: Progressing Goal: Sleeping patterns will improve Outcome: Progressing   

## 2022-08-04 NOTE — Plan of Care (Signed)
D: Patient alert and oriented. Patient denies pain. When asked about anxiety and depression patient states "I don't want to die again." Patient came to medication room during scheduled medication pass and stated "I need to have my klonopin, no one understands that." It was explained to patient that this is a scheduled medication, meaning there are specific times the medication is given. Patient denies SI/HI/AVH. When assessed about HI patient denies and states " a lot of people want to hurt me though." Patient mood is labile. Patient irritable throughout shift after being told that scheduled medications are meant to be given during the scheduled times and medications even PRN medications still have times they are meant to me given.  Patient apologetic for irritable behavior when patient receives medication at each medication administration during shift.   A: Scheduled medications administered to patient, per MD orders.  Support and encouragement provided to patient.  Q15 minute safety checks maintained.   R: Patient compliant with medication administration and treatment plan. No adverse drug reactions noted. Patient remains safe on the unit at this time. Problem: Education: Goal: Knowledge of General Education information will improve Description: Including pain rating scale, medication(s)/side effects and non-pharmacologic comfort measures Outcome: Progressing   Problem: Nutrition: Goal: Adequate nutrition will be maintained Outcome: Progressing   Problem: Education: Goal: Knowledge of the prescribed therapeutic regimen will improve Outcome: Progressing

## 2022-08-04 NOTE — Progress Notes (Signed)
[facility] MD Progress Note  08/04/2022 11:21 AM [name]  MRN:  [id number] Subjective: Follow-up 42 year old woman with schizoaffective disorder.  Patient was crying in the day room.  She stopped crying easily but is still disorganized in her thoughts.  Her description of what is upsetting her is hard to follow jumps from one topic to another.  She was complaining of pain in her back and was reminded that she is on both Tylenol and Motrin now and would be able to get some more in another hour and a half.  Did not seem to be in terrible agony from it.  Still delusional and paranoid at times less agitated.  Sleeping better. Principal Problem: Schizoaffective disorder, bipolar type ([place]) Diagnosis: Principal Problem:   Schizoaffective disorder, bipolar type ([place]) Active Problems:   Seizure ([place])   Hypokalemia  Total Time spent with patient: 30 minutes  Past Psychiatric History: Past history of schizoaffective disorder  Past Medical History:  Past Medical History:  Diagnosis Date   Degeneration of lumbar intervertebral disc    Depression    DVT (deep vein thrombosis) in pregnancy    Schizoaffective disorder (HCC)    Seizures (HCC)    Shoulder pain, right     Past Surgical History:  Procedure Laterality Date   CESAREAN SECTION     X2   Family History:  Family History  Problem Relation Age of Onset   Cancer Mother    Family Psychiatric  History: See previous Social History:  Social History   Substance and Sexual Activity  Alcohol Use No     Social History   Substance and Sexual Activity  Drug Use Yes   Types: Marijuana   Comment: reports daily cannabis use    Social History   Socioeconomic History   Marital status: Divorced    Spouse name: Not on file   Number of children: Not on file   Years of education: Not on file   Highest education level: Not on file  Occupational History   Occupation: unemployed  Tobacco Use   Smoking status: Every Day    Packs/day:  1.50    Types: Cigarettes   Smokeless tobacco: Never  Vaping Use   Vaping Use: Never used  Substance and Sexual Activity   Alcohol use: No   Drug use: Yes    Types: Marijuana    Comment: reports daily cannabis use   Sexual activity: Yes    Partners: Male  Other Topics Concern   Not on file  Social History Narrative   Not on file   Social Determinants of Health   Financial Resource Strain: Unknown (11/17/2018)   Overall Financial Resource Strain (CARDIA)    Difficulty of Paying Living Expenses: Patient refused  Food Insecurity: No Food Insecurity (07/31/2022)   Hunger Vital Sign    Worried About Running Out of Food in the Last Year: Never true    Ran Out of Food in the Last Year: Never true  Transportation Needs: Unmet Transportation Needs (08/01/2022)   PRAPARE - Transportation    Lack of Transportation (Medical): No    Lack of Transportation (Non-Medical): Yes  Physical Activity: Unknown (11/17/2018)   Exercise Vital Sign    Days of Exercise per Week: Patient refused    Minutes of Exercise per Session: Patient refused  Stress: Unknown (11/17/2018)   [employer] [place]    Feeling of Stress : Patient refused  Social Connections: Unknown (11/17/2018)   Social  Connection and Isolation Panel [NHANES]    Frequency of Communication with Friends and Family: Patient refused    Frequency of Social Gatherings with Friends and Family: Patient refused    Attends Religious Services: Patient refused    Marine scientist or Organizations: Patient refused    Attends Music therapist: Patient refused    Marital Status: Patient refused   Additional Social History:                         Sleep: Fair  Appetite:  Fair  Current Medications: Current Facility-Administered Medications  Medication Dose Route Frequency Provider Last Rate Last Admin   acetaminophen (TYLENOL) tablet 650 mg  650 mg Oral Q6H PRN  Deloria Lair, NP   650 mg at 08/04/22 0641   alum & mag hydroxide-simeth (MAALOX/MYLANTA) 200-200-20 MG/5ML suspension 30 mL  30 mL Oral Q4H PRN Deloria Lair, NP       clonazePAM (KLONOPIN) tablet 1 mg  1 mg Oral TID Deloria Lair, NP   1 mg at 08/04/22 2951   hydrOXYzine (ATARAX) tablet 25 mg  25 mg Oral TID PRN Deloria Lair, NP   25 mg at 08/03/22 1937   ibuprofen (ADVIL) tablet 600 mg  600 mg Oral Q6H PRN Theodosia Bahena, Madie Reno, MD   600 mg at 08/04/22 8841   influenza vac split quadrivalent PF (FLUARIX) injection 0.5 mL  0.5 mL Intramuscular Tomorrow-1000 Adreanna Fickel T, MD       lacosamide (VIMPAT) tablet 50 mg  50 mg Oral BID Doren Custard, Rashaun M, NP   50 mg at 08/04/22 6606   magnesium hydroxide (MILK OF MAGNESIA) suspension 30 mL  30 mL Oral Daily PRN Deloria Lair, NP       nicotine (NICODERM CQ - dosed in mg/24 hours) patch 21 mg  21 mg Transdermal Daily Ladoris Lythgoe T, MD   21 mg at 08/04/22 3016   QUEtiapine (SEROQUEL) tablet 800 mg  800 mg Oral QHS Jahira Swiss T, MD   800 mg at 08/03/22 2112   traZODone (DESYREL) tablet 50 mg  50 mg Oral QHS PRN Deloria Lair, NP   50 mg at 08/03/22 2242    Lab Results: No results found for this or any previous visit (from the past 48 hour(s)).  Blood Alcohol level:  Lab Results  Component Value Date   ETH <10 07/31/2022   ETH <10 07/05/3233    Metabolic Disorder Labs: Lab Results  Component Value Date   HGBA1C 5.2 08/02/2022   MPG 102.54 08/02/2022   MPG 102.54 03/17/2022   No results found for: "PROLACTIN" Lab Results  Component Value Date   CHOL 192 08/02/2022   TRIG 270 (H) 08/02/2022   HDL 37 (L) 08/02/2022   CHOLHDL 5.2 08/02/2022   VLDL 54 (H) 08/02/2022   LDLCALC 101 (H) 08/02/2022   LDLCALC 206 (H) 03/17/2022    Physical Findings: AIMS: Facial and Oral Movements Muscles of Facial Expression: None, normal Lips and Perioral Area: None, normal Jaw: None, normal Tongue: None, normal,Extremity  Movements Upper (arms, wrists, hands, fingers): None, normal Lower (legs, knees, ankles, toes): None, normal, Trunk Movements Neck, shoulders, hips: None, normal, Overall Severity Severity of abnormal movements (highest score from questions above): None, normal Incapacitation due to abnormal movements: None, normal Patient's awareness of abnormal movements (rate only patient's report): No Awareness, Dental Status Current problems with teeth and/or dentures?: No Does patient usually  wear dentures?: No  CIWA:    COWS:     Musculoskeletal: Strength & Muscle Tone: within normal limits Gait & Station: normal Patient leans: N/A  Psychiatric Specialty Exam:  Presentation  General Appearance:  Bizarre  Eye Contact: Fair  Speech: Clear and Coherent  Speech Volume: Normal  Handedness: Right   Mood and Affect  Mood: Dysphoric  Affect: Inappropriate   Thought Process  Thought Processes: Irrevelant  Descriptions of Associations:Tangential  Orientation:Full (Time, Place and Person)  Thought Content:Illogical  History of Schizophrenia/Schizoaffective disorder:No  Duration of Psychotic Symptoms:No data recorded Hallucinations:No data recorded Ideas of Reference:Paranoia  Suicidal Thoughts:No data recorded Homicidal Thoughts:No data recorded  Sensorium  Memory: Immediate Poor  Judgment: Impaired  Insight: Lacking   Executive Functions  Concentration: Fair  Attention Span: Fair  Recall: [place] of Knowledge: Fair  Language: Fair   Psychomotor Activity  Psychomotor Activity:No data recorded  Assets  Assets: Desire for Improvement; Financial Resources/Insurance; Housing; Social Support   Sleep  Sleep:No data recorded   Physical Exam: Physical Exam Vitals and nursing note reviewed.  Constitutional:      Appearance: Normal appearance.  HENT:     Head: Normocephalic and atraumatic.     Mouth/Throat:     Pharynx: Oropharynx is  clear.  Eyes:     Pupils: Pupils are equal, round, and reactive to light.  Cardiovascular:     Rate and Rhythm: Normal rate and regular rhythm.  Pulmonary:     Effort: Pulmonary effort is normal.     Breath sounds: Normal breath sounds.  Abdominal:     General: Abdomen is flat.     Palpations: Abdomen is soft.  Musculoskeletal:        General: Normal range of motion.  Skin:    General: Skin is warm and dry.  Neurological:     General: No focal deficit present.     Mental Status: She is alert. Mental status is at baseline.  Psychiatric:        Attention and Perception: She is inattentive.        Mood and Affect: Affect is labile and tearful.        Speech: Speech is tangential.        Thought Content: Thought content is delusional.    Review of Systems  Constitutional: Negative.   HENT: Negative.    Eyes: Negative.   Respiratory: Negative.    Cardiovascular: Negative.   Gastrointestinal: Negative.   Musculoskeletal: Negative.   Skin: Negative.   Neurological: Negative.   Psychiatric/Behavioral:  Positive for depression. Negative for substance abuse and suicidal ideas. The patient is nervous/anxious.    Blood pressure 105/79, pulse 92, temperature 97.7 F (36.5 C), temperature source Oral, resp. rate 18, height 5\' 1"  (1.549 m), weight 69.9 kg, SpO2 100 %. Body mass index is 29.1 kg/m.   Treatment Plan Summary: Medication management and Plan no change to medication management.  Support and encouragement provided.  Urged patient to be more patient about her pain medicine.  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  Continue current psychiatric medicine.  Alethia Berthold, MD 08/04/2022, 11:21 AM

## 2022-08-05 DIAGNOSIS — F250 Schizoaffective disorder, bipolar type: Secondary | ICD-10-CM | POA: Diagnosis not present

## 2022-08-05 MED ORDER — CLONAZEPAM 1 MG PO TABS
1.0000 mg | ORAL_TABLET | Freq: Four times a day (QID) | ORAL | Status: DC
  Administered 2022-08-05 – 2022-08-11 (×24): 1 mg via ORAL
  Filled 2022-08-05 (×25): qty 1
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
## 2022-08-05 NOTE — Plan of Care (Signed)
  Problem: Nutrition: Goal: Adequate nutrition will be maintained Outcome: Progressing   Problem: Education: Goal: Will be free of psychotic symptoms Outcome: Progressing   Problem: Education: Goal: Knowledge of General Education information will improve Description: Including pain rating scale, medication(s)/side effects and non-pharmacologic comfort measures Outcome: Not Progressing   Problem: Coping: Goal: Level of anxiety will decrease Outcome: Not Progressing   Problem: Pain Managment: Goal: General experience of comfort will improve Outcome: Not Progressing

## 2022-08-05 NOTE — Progress Notes (Signed)
Patient continues to be med seeking requesting controlled meds outside of its administration time.

## 2022-08-05 NOTE — Plan of Care (Signed)
  Problem: Safety: Goal: Ability to remain free from injury will improve Outcome: Progressing   Problem: Education: Goal: Will be free of psychotic symptoms Outcome: Progressing   Problem: Safety: Goal: Ability to redirect hostility and anger into socially appropriate behaviors will improve Outcome: Progressing

## 2022-08-05 NOTE — Progress Notes (Signed)
[facility] MD Progress Note  08/05/2022 3:41 PM [name]  MRN:  [id number] Subjective: Follow-up 42 year old woman with schizoaffective disorder.  Patient seen and chart reviewed.  Making progress.  Not talking about grossly psychotic stuff.  Mostly wants to talk about drawing things and ways to manage her mood and anxiety at home.  Tolerating medicine well.  No behavior problems not acting out. Principal Problem: Schizoaffective disorder, bipolar type ([place]) Diagnosis: Principal Problem:   Schizoaffective disorder, bipolar type ([place]) Active Problems:   Seizure ([place])   Hypokalemia  Total Time spent with patient: 30 minutes  Past Psychiatric History: Past history of schizoaffective disorder bipolar type.  Frequent presentation with mood related psychosis  Past Medical History:  Past Medical History:  Diagnosis Date   Degeneration of lumbar intervertebral disc    Depression    DVT (deep vein thrombosis) in pregnancy    Schizoaffective disorder (HCC)    Seizures (HCC)    Shoulder pain, right     Past Surgical History:  Procedure Laterality Date   CESAREAN SECTION     X2   Family History:  Family History  Problem Relation Age of Onset   Cancer Mother    Family Psychiatric  History: See previous Social History:  Social History   Substance and Sexual Activity  Alcohol Use No     Social History   Substance and Sexual Activity  Drug Use Yes   Types: Marijuana   Comment: reports daily cannabis use    Social History   Socioeconomic History   Marital status: Divorced    Spouse name: Not on file   Number of children: Not on file   Years of education: Not on file   Highest education level: Not on file  Occupational History   Occupation: unemployed  Tobacco Use   Smoking status: Every Day    Packs/day: 1.50    Types: Cigarettes   Smokeless tobacco: Never  Vaping Use   Vaping Use: Never used  Substance and Sexual Activity   Alcohol use: No   Drug use: Yes     Types: Marijuana    Comment: reports daily cannabis use   Sexual activity: Yes    Partners: Male  Other Topics Concern   Not on file  Social History Narrative   Not on file   Social Determinants of Health   Financial Resource Strain: Unknown (11/17/2018)   Overall Financial Resource Strain (CARDIA)    Difficulty of Paying Living Expenses: Patient refused  Food Insecurity: No Food Insecurity (07/31/2022)   Hunger Vital Sign    Worried About Running Out of Food in the Last Year: Never true    Ran Out of Food in the Last Year: Never true  Transportation Needs: Unmet Transportation Needs (08/01/2022)   PRAPARE - Transportation    Lack of Transportation (Medical): No    Lack of Transportation (Non-Medical): Yes  Physical Activity: Unknown (11/17/2018)   Exercise Vital Sign    Days of Exercise per Week: Patient refused    Minutes of Exercise per Session: Patient refused  Stress: Unknown (11/17/2018)   [employer] [place]    Feeling of Stress : Patient refused  Social Connections: Unknown (11/17/2018)   Social Connection and Isolation Panel [NHANES]    Frequency of Communication with Friends and Family: Patient refused    Frequency of Social Gatherings with Friends and Family: Patient refused    Attends Religious Services: Patient refused    Active  Member of Clubs or Organizations: Patient refused    Attends Archivist Meetings: Patient refused    Marital Status: Patient refused   Additional Social History:                         Sleep: Fair  Appetite:  Fair  Current Medications: Current Facility-Administered Medications  Medication Dose Route Frequency Provider Last Rate Last Admin   acetaminophen (TYLENOL) tablet 650 mg  650 mg Oral Q6H PRN Deloria Lair, NP   650 mg at 08/05/22 1351   alum & mag hydroxide-simeth (MAALOX/MYLANTA) 200-200-20 MG/5ML suspension 30 mL  30 mL Oral Q4H PRN Dixon, Rashaun M,  NP       clonazePAM (KLONOPIN) tablet 1 mg  1 mg Oral QID Umair Rosiles, Madie Reno, MD       hydrOXYzine (ATARAX) tablet 25 mg  25 mg Oral TID PRN Deloria Lair, NP   25 mg at 08/05/22 1351   ibuprofen (ADVIL) tablet 600 mg  600 mg Oral Q6H PRN Opel Lejeune T, MD   600 mg at 08/04/22 2057   influenza vac split quadrivalent PF (FLUARIX) injection 0.5 mL  0.5 mL Intramuscular Tomorrow-1000 Chavon Lucarelli T, MD       lacosamide (VIMPAT) tablet 50 mg  50 mg Oral BID Doren Custard, Rashaun M, NP   50 mg at 08/05/22 0747   magnesium hydroxide (MILK OF MAGNESIA) suspension 30 mL  30 mL Oral Daily PRN Deloria Lair, NP       nicotine (NICODERM CQ - dosed in mg/24 hours) patch 21 mg  21 mg Transdermal Daily Damon Baisch T, MD   21 mg at 08/05/22 0748   QUEtiapine (SEROQUEL) tablet 800 mg  800 mg Oral QHS Emaley Applin T, MD   800 mg at 08/04/22 2057   traZODone (DESYREL) tablet 50 mg  50 mg Oral QHS PRN Deloria Lair, NP   50 mg at 08/04/22 2057    Lab Results: No results found for this or any previous visit (from the past 48 hour(s)).  Blood Alcohol level:  Lab Results  Component Value Date   ETH <10 07/31/2022   ETH <10 123456    Metabolic Disorder Labs: Lab Results  Component Value Date   HGBA1C 5.2 08/02/2022   MPG 102.54 08/02/2022   MPG 102.54 03/17/2022   No results found for: "PROLACTIN" Lab Results  Component Value Date   CHOL 192 08/02/2022   TRIG 270 (H) 08/02/2022   HDL 37 (L) 08/02/2022   CHOLHDL 5.2 08/02/2022   VLDL 54 (H) 08/02/2022   LDLCALC 101 (H) 08/02/2022   LDLCALC 206 (H) 03/17/2022    Physical Findings: AIMS: Facial and Oral Movements Muscles of Facial Expression: None, normal Lips and Perioral Area: None, normal Jaw: None, normal Tongue: None, normal,Extremity Movements Upper (arms, wrists, hands, fingers): None, normal Lower (legs, knees, ankles, toes): None, normal, Trunk Movements Neck, shoulders, hips: None, normal, Overall Severity Severity of  abnormal movements (highest score from questions above): None, normal Incapacitation due to abnormal movements: None, normal Patient's awareness of abnormal movements (rate only patient's report): No Awareness, Dental Status Current problems with teeth and/or dentures?: No Does patient usually wear dentures?: No  CIWA:    COWS:     Musculoskeletal: Strength & Muscle Tone: within normal limits Gait & Station: normal Patient leans: N/A  Psychiatric Specialty Exam:  Presentation  General Appearance:  Bizarre  Eye Contact: Fair  Speech:  Clear and Coherent  Speech Volume: Normal  Handedness: Right   Mood and Affect  Mood: Dysphoric  Affect: Inappropriate   Thought Process  Thought Processes: Irrevelant  Descriptions of Associations:Tangential  Orientation:Full (Time, Place and Person)  Thought Content:Illogical  History of Schizophrenia/Schizoaffective disorder:No  Duration of Psychotic Symptoms:No data recorded Hallucinations:No data recorded Ideas of Reference:Paranoia  Suicidal Thoughts:No data recorded Homicidal Thoughts:No data recorded  Sensorium  Memory: Immediate Poor  Judgment: Impaired  Insight: Lacking   Executive Functions  Concentration: Fair  Attention Span: Fair  Recall: [place] of Knowledge: Fair  Language: Fair   Psychomotor Activity  Psychomotor Activity:No data recorded  Assets  Assets: Desire for Improvement; Financial Resources/Insurance; Housing; Social Support   Sleep  Sleep:No data recorded   Physical Exam: Physical Exam Vitals and nursing note reviewed.  Constitutional:      Appearance: Normal appearance.  HENT:     Head: Normocephalic and atraumatic.     Mouth/Throat:     Pharynx: Oropharynx is clear.  Eyes:     Pupils: Pupils are equal, round, and reactive to light.  Cardiovascular:     Rate and Rhythm: Normal rate and regular rhythm.  Pulmonary:     Effort: Pulmonary effort is  normal.     Breath sounds: Normal breath sounds.  Abdominal:     General: Abdomen is flat.     Palpations: Abdomen is soft.  Musculoskeletal:        General: Normal range of motion.  Skin:    General: Skin is warm and dry.  Neurological:     General: No focal deficit present.     Mental Status: She is alert. Mental status is at baseline.  Psychiatric:        Attention and Perception: Attention normal.        Mood and Affect: Mood is anxious.        Speech: Speech normal.        Behavior: Behavior normal.        Thought Content: Thought content normal.        Cognition and Memory: Cognition normal.    Review of Systems  Constitutional: Negative.   HENT: Negative.    Eyes: Negative.   Respiratory: Negative.    Cardiovascular: Negative.   Gastrointestinal: Negative.   Musculoskeletal: Negative.   Skin: Negative.   Neurological: Negative.   Psychiatric/Behavioral:  Negative for depression. The patient is nervous/anxious.    Blood pressure 114/77, pulse 92, temperature 97.8 F (36.6 C), temperature source Oral, resp. rate 18, height 5' 1"$  (1.549 m), weight 69.9 kg, SpO2 99 %. Body mass index is 29.1 kg/m.   Treatment Plan Summary: Medication management and Plan patient seems to be stabilizing.  Tolerating medicine well.  Certainly no sign of over sedation.  Encouraged her to keep up with her medicine.  I think at the rate she is going she might be ready for discharge as early as Monday.  Alethia Berthold, MD 08/05/2022, 3:41 PM

## 2022-08-05 NOTE — [facility] Group Notes (Signed)
[facility] Group Notes:  (Nursing/MHT/Case Management/Adjunct)  Date:  08/05/2022  Time:  10:09 AM  Type of Therapy:   Community Meeting  Participation Level:  Did Not Attend   [name] [place] 08/05/2022, 10:09 AM

## 2022-08-05 NOTE — Progress Notes (Signed)
Patient alert and oriented. Denies SI, Hi, AVH. Endorses anxiety, patient continues to request klonopin, Probation officer informed due at 8 12 and 5. Patient complains of generalized pain, request med, given with good relief. Patient needy, labile irritable at times. Encouragement and support provided, safety checks maintained. Medications given as prescribed. Pt receptive and remains safe on unit with q 15 min checks.

## 2022-08-06 DIAGNOSIS — F250 Schizoaffective disorder, bipolar type: Secondary | ICD-10-CM | POA: Diagnosis not present

## 2022-08-06 MED ORDER — HYDROXYZINE HCL 25 MG PO TABS
25.0000 mg | ORAL_TABLET | Freq: Three times a day (TID) | ORAL | Status: DC | PRN
  Administered 2022-08-06 – 2022-08-09 (×7): 25 mg via ORAL
  Filled 2022-08-06 (×7): qty 1
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
## 2022-08-06 NOTE — Progress Notes (Signed)
[facility] MD Progress Note  08/06/2022 11:50 AM [name]  MRN:  [id number] Subjective: [name] is seen on rounds.  She says that the nurses will give her medicines at 6 PM but at 8 PM.  Went back to chart and are not really sure what medicine she is talking about.  She does asked to take Vistaril at bedtime so I did not make that change.  Other than that she has been pleasant and cooperative and no issues. Principal Problem: Schizoaffective disorder, bipolar type ([place]) Diagnosis: Principal Problem:   Schizoaffective disorder, bipolar type ([place]) Active Problems:   Seizure ([place])   Hypokalemia  Total Time spent with patient: 15 minutes  Past Psychiatric History: Unremarkable  Past Medical History:  Past Medical History:  Diagnosis Date   Degeneration of lumbar intervertebral disc    Depression    DVT (deep vein thrombosis) in pregnancy    Schizoaffective disorder (HCC)    Seizures (HCC)    Shoulder pain, right     Past Surgical History:  Procedure Laterality Date   CESAREAN SECTION     X2   Family History:  Family History  Problem Relation Age of Onset   Cancer Mother    Family Psychiatric  History: Unremarkable Social History:  Social History   Substance and Sexual Activity  Alcohol Use No     Social History   Substance and Sexual Activity  Drug Use Yes   Types: Marijuana   Comment: reports daily cannabis use    Social History   Socioeconomic History   Marital status: Divorced    Spouse name: Not on file   Number of children: Not on file   Years of education: Not on file   Highest education level: Not on file  Occupational History   Occupation: unemployed  Tobacco Use   Smoking status: Every Day    Packs/day: 1.50    Types: Cigarettes   Smokeless tobacco: Never  Vaping Use   Vaping Use: Never used  Substance and Sexual Activity   Alcohol use: No   Drug use: Yes    Types: Marijuana    Comment: reports daily cannabis use   Sexual activity:  Yes    Partners: Male  Other Topics Concern   Not on file  Social History Narrative   Not on file   Social Determinants of Health   Financial Resource Strain: Unknown (11/17/2018)   Overall Financial Resource Strain (CARDIA)    Difficulty of Paying Living Expenses: Patient refused  Food Insecurity: No Food Insecurity (07/31/2022)   Hunger Vital Sign    Worried About Running Out of Food in the Last Year: Never true    Ran Out of Food in the Last Year: Never true  Transportation Needs: Unmet Transportation Needs (08/01/2022)   PRAPARE - Transportation    Lack of Transportation (Medical): No    Lack of Transportation (Non-Medical): Yes  Physical Activity: Unknown (11/17/2018)   Exercise Vital Sign    Days of Exercise per Week: Patient refused    Minutes of Exercise per Session: Patient refused  Stress: Unknown (11/17/2018)   [place]    Feeling of Stress : Patient refused  Social Connections: Unknown (11/17/2018)   Social Connection and Isolation Panel [NHANES]    Frequency of Communication with Friends and Family: Patient refused    Frequency of Social Gatherings with Friends and Family: Patient refused    Attends Religious Services: Patient refused  Active Member of Clubs or Organizations: Patient refused    Attends Archivist Meetings: Patient refused    Marital Status: Patient refused   Additional Social History:                         Sleep: Good  Appetite:  Good  Current Medications: Current Facility-Administered Medications  Medication Dose Route Frequency Provider Last Rate Last Admin   acetaminophen (TYLENOL) tablet 650 mg  650 mg Oral Q6H PRN Deloria Lair, NP   650 mg at 08/05/22 2100   alum & mag hydroxide-simeth (MAALOX/MYLANTA) 200-200-20 MG/5ML suspension 30 mL  30 mL Oral Q4H PRN Deloria Lair, NP   30 mL at 08/05/22 1833   clonazePAM (KLONOPIN) tablet 1 mg  1 mg Oral  QID Clapacs, John T, MD   1 mg at 08/06/22 [id number]   hydrOXYzine (ATARAX) tablet 25 mg  25 mg Oral TID PRN Parks Ranger, DO       ibuprofen (ADVIL) tablet 600 mg  600 mg Oral Q6H PRN Clapacs, John T, MD   600 mg at 08/04/22 2057   influenza vac split quadrivalent PF (FLUARIX) injection 0.5 mL  0.5 mL Intramuscular Tomorrow-1000 Clapacs, John T, MD       lacosamide (VIMPAT) tablet 50 mg  50 mg Oral BID Doren Custard, Rashaun M, NP   50 mg at 08/06/22 0804   magnesium hydroxide (MILK OF MAGNESIA) suspension 30 mL  30 mL Oral Daily PRN Deloria Lair, NP   30 mL at 08/05/22 1834   nicotine (NICODERM CQ - dosed in mg/24 hours) patch 21 mg  21 mg Transdermal Daily Clapacs, John T, MD   21 mg at 08/06/22 0805   QUEtiapine (SEROQUEL) tablet 800 mg  800 mg Oral QHS Clapacs, John T, MD   800 mg at 08/05/22 2100   traZODone (DESYREL) tablet 50 mg  50 mg Oral QHS PRN Deloria Lair, NP   50 mg at 08/04/22 2057    Lab Results: No results found for this or any previous visit (from the past 48 hour(s)).  Blood Alcohol level:  Lab Results  Component Value Date   ETH <10 07/31/2022   ETH <10 123456    Metabolic Disorder Labs: Lab Results  Component Value Date   HGBA1C 5.2 08/02/2022   MPG 102.54 08/02/2022   MPG 102.54 03/17/2022   No results found for: "PROLACTIN" Lab Results  Component Value Date   CHOL 192 08/02/2022   TRIG 270 (H) 08/02/2022   HDL 37 (L) 08/02/2022   CHOLHDL 5.2 08/02/2022   VLDL 54 (H) 08/02/2022   LDLCALC 101 (H) 08/02/2022   LDLCALC 206 (H) 03/17/2022    Physical Findings: AIMS: Facial and Oral Movements Muscles of Facial Expression: None, normal Lips and Perioral Area: None, normal Jaw: None, normal Tongue: None, normal,Extremity Movements Upper (arms, wrists, hands, fingers): None, normal Lower (legs, knees, ankles, toes): None, normal, Trunk Movements Neck, shoulders, hips: None, normal, Overall Severity Severity of abnormal movements (highest score  from questions above): None, normal Incapacitation due to abnormal movements: None, normal Patient's awareness of abnormal movements (rate only patient's report): No Awareness, Dental Status Current problems with teeth and/or dentures?: No Does patient usually wear dentures?: No  CIWA:    COWS:     Musculoskeletal: Strength & Muscle Tone: within normal limits Gait & Station: normal Patient leans: N/A  Psychiatric Specialty Exam:  Presentation  General Appearance:  Bizarre  Eye Contact: Fair  Speech: Clear and Coherent  Speech Volume: Normal  Handedness: Right   Mood and Affect  Mood: Dysphoric  Affect: Inappropriate   Thought Process  Thought Processes: Irrevelant  Descriptions of Associations:Tangential  Orientation:Full (Time, Place and Person)  Thought Content:Illogical  History of Schizophrenia/Schizoaffective disorder:No  Duration of Psychotic Symptoms:No data recorded Hallucinations:No data recorded Ideas of Reference:Paranoia  Suicidal Thoughts:No data recorded Homicidal Thoughts:No data recorded  Sensorium  Memory: Immediate Poor  Judgment: Impaired  Insight: Lacking   Executive Functions  Concentration: Fair  Attention Span: Fair  Recall: [place] of Knowledge: Fair  Language: Fair   Psychomotor Activity  Psychomotor Activity:No data recorded  Assets  Assets: Desire for Improvement; Financial Resources/Insurance; Housing; Social Support   Sleep  Sleep:No data recorded    Blood pressure 94/74, pulse 99, temperature (!) 97.5 F (36.4 C), temperature source Oral, resp. rate 18, height 5' 1"$  (1.549 m), weight 69.9 kg, SpO2 96 %. Body mass index is 29.1 kg/m.   Treatment Plan Summary: Daily contact with patient to assess and evaluate symptoms and progress in treatment, Medication management, and Plan continue current medications.  Parks Ranger, DO 08/06/2022, 11:50 AM

## 2022-08-06 NOTE — Plan of Care (Signed)
D- Patient alert and oriented x 2-3.Marland Kitchen Affect blunted/mood labile. Denies SI/HI/ AVH. She denies pain. Early in the shift she was verbally abrasive and demanding "my medicine". She was able to be deescalated and divert to the dayroom and eat breakfast. She was reassured that she would get her medication after breakfast. A- Scheduled medications administered to patient, per MD orders. Support and encouragement provided.  Routine safety checks conducted every 15 minutes.  Patient informed to notify staff with problems or concerns. R- No adverse drug. Behavior improved and cooperative for the rest of the shift. Patient contracts for safety and patient remains safe on the unit at this time.

## 2022-08-06 NOTE — BHH Group Notes (Signed)
LCSW Wellness Group Note   08/06/2022 1:00pm  Type of Group and Topic: Psychoeducational Group:  Wellness  Participation Level:  limited  Description of Group  Wellness group introduces the topic and its focus on developing healthy habits across the spectrum and its relationship to a decrease in hospital admissions.  Six areas of wellness are discussed: physical, social spiritual, intellectual, occupational, and emotional.  Patients are asked to consider their current wellness habits and to identify areas of wellness where they are interested and able to focus on improvements.    Therapeutic Goals Patients will understand components of wellness and how they can positively impact overall health.  Patients will identify areas of wellness where they have developed good habits. Patients will identify areas of wellness where they would like to make improvements.    Summary of Patient Progress: pt present for the entire group but coloring on an art sheet throughout.  Unable to tell if pt was paying much attention to the discussion.  When asked to identify positive and negative wellness areas in her life, pt talked about being involved with internet debates with Eminem.  Pt later stood up and talked/ranted about rich people who were stealing resources from under the ground.  Not able to focus on group topic or respond appropriately.       Therapeutic Modalities: Cognitive Behavioral Therapy Psychoeducation    Joanne Chars, LCSW

## 2022-08-06 NOTE — BH IP Treatment Plan (Signed)
Interdisciplinary Treatment and Diagnostic Plan Update  08/06/2022 Time of Session: [address] [name] MRN: [id number]  Principal Diagnosis: Schizoaffective disorder, bipolar type [facility])  Secondary Diagnoses: Principal Problem:   Schizoaffective disorder, bipolar type ([place]) Active Problems:   Seizure ([place])   Hypokalemia   Current Medications:  Current Facility-Administered Medications  Medication Dose Route Frequency Provider Last Rate Last Admin   acetaminophen (TYLENOL) tablet 650 mg  650 mg Oral Q6H PRN Adrienne Lair, [name]   650 mg at 08/05/22 2100   alum & mag hydroxide-simeth (MAALOX/MYLANTA) 200-200-20 MG/5ML suspension 30 mL  30 mL Oral Q4H PRN Adrienne Lair, [name]   30 mL at 08/05/22 1833   clonazePAM (KLONOPIN) tablet 1 mg  1 mg Oral QID [name], Adrienne [place], [name]   1 mg at 08/06/22 [id number]   hydrOXYzine (ATARAX) tablet 25 mg  25 mg Oral TID PRN Adrienne Lair, [name]   25 mg at 08/05/22 1351   ibuprofen (ADVIL) tablet 600 mg  600 mg Oral Q6H PRN [name], [name], [name]   600 mg at 08/04/22 2057   influenza vac split quadrivalent PF (FLUARIX) injection 0.5 mL  0.5 mL Intramuscular Tomorrow-1000 [name], [name], [name]       lacosamide (VIMPAT) tablet 50 mg  50 mg Oral BID [name], [name], [name]   50 mg at 08/06/22 0804   magnesium hydroxide (MILK OF MAGNESIA) suspension 30 mL  30 mL Oral Daily PRN Adrienne Lair, [name]   30 mL at 08/05/22 1834   nicotine (NICODERM CQ - dosed in mg/24 hours) patch 21 mg  21 mg Transdermal Daily [name], [name], [name]   21 mg at 08/06/22 0805   QUEtiapine (SEROQUEL) tablet 800 mg  800 mg Oral QHS [name], [name], [name]   800 mg at 08/05/22 2100   traZODone (DESYREL) tablet 50 mg  50 mg Oral QHS PRN Adrienne Lair, [name]   50 mg at 08/04/22 2057   PTA Medications: Medications Prior to Admission  Medication Sig Dispense Refill Last Dose   Buprenorphine HCl-Naloxone HCl 8-2 MG FILM Place 1 Film under the tongue 2 (two) times daily.      buprenorphine-naloxone  (SUBOXONE) 8-2 mg SUBL SL tablet Place 0.5 tablets under the tongue 2 (two) times daily. 30 tablet 0    clonazePAM (KLONOPIN) 1 MG tablet Take 1 tablet (1 mg total) by mouth 3 (three) times daily. 90 tablet 0    famotidine (PEPCID) 20 MG tablet Take 1 tablet (20 mg total) by mouth 2 (two) times daily. 60 tablet 1    lacosamide (VIMPAT) 50 MG TABS tablet Take 1 tablet (50 mg total) by mouth 2 (two) times daily. 60 tablet 1    nicotine (NICODERM CQ - DOSED IN MG/24 HOURS) 21 mg/24hr patch Place 1 patch (21 mg total) onto the skin daily. 28 patch 0    PARoxetine (PAXIL) 20 MG tablet Take 1 tablet (20 mg total) by mouth at bedtime. 30 tablet 1    polyethylene glycol (MIRALAX / GLYCOLAX) 17 g packet Take 17 g by mouth daily. 28 each 1    QUEtiapine (SEROQUEL) 400 MG tablet Take 2 tablets (800 mg total) by mouth at bedtime. (Patient taking differently: Take 400-800 mg by mouth at bedtime.) 60 tablet 1    sulfamethoxazole-trimethoprim (BACTRIM DS) 800-160 MG tablet Take 1 tablet by mouth every 12 (twelve) hours. 8 tablet 0     Patient Stressors:    Patient Strengths:  Treatment Modalities: Medication Management, Group therapy, Case management,  1 to 1 session with clinician, Psychoeducation, Recreational therapy.   Physician Treatment Plan for Primary Diagnosis: Schizoaffective disorder, bipolar type ([place]) Long Term Goal(s): Improvement in symptoms so as ready for discharge   Short Term Goals: Ability to maintain clinical measurements within normal limits will improve Compliance with prescribed medications will improve Ability to verbalize feelings will improve Ability to demonstrate self-control will improve Ability to identify and develop effective coping behaviors will improve  Medication Management: Evaluate patient's response, side effects, and tolerance of medication regimen.  Therapeutic Interventions: 1 to 1 sessions, Unit Group sessions and Medication administration.  Evaluation  of Outcomes: Progressing  Physician Treatment Plan for Secondary Diagnosis: Principal Problem:   Schizoaffective disorder, bipolar type ([employer]) Active Problems:   Seizure ([place])   Hypokalemia  Long Term Goal(s): Improvement in symptoms so as ready for discharge   Short Term Goals: Ability to maintain clinical measurements within normal limits will improve Compliance with prescribed medications will improve Ability to verbalize feelings will improve Ability to demonstrate self-control will improve Ability to identify and develop effective coping behaviors will improve     Medication Management: Evaluate patient's response, side effects, and tolerance of medication regimen.  Therapeutic Interventions: 1 to 1 sessions, Unit Group sessions and Medication administration.  Evaluation of Outcomes: Progressing   RN Treatment Plan for Primary Diagnosis: Schizoaffective disorder, bipolar type ([place]) Long Term Goal(s): Knowledge of disease and therapeutic regimen to maintain health will improve  Short Term Goals: Ability to identify and develop effective coping behaviors will improve and Compliance with prescribed medications will improve  Medication Management: RN will administer medications as ordered by provider, will assess and evaluate patient's response and provide education to patient for prescribed medication. RN will report any adverse and/or side effects to prescribing provider.  Therapeutic Interventions: 1 on 1 counseling sessions, Psychoeducation, Medication administration, Evaluate responses to treatment, Monitor vital signs and CBGs as ordered, Perform/monitor CIWA, COWS, AIMS and Fall Risk screenings as ordered, Perform wound care treatments as ordered.  Evaluation of Outcomes: Progressing   LCSW Treatment Plan for Primary Diagnosis: Schizoaffective disorder, bipolar type ([place]) Long Term Goal(s): Safe transition to appropriate next level of care at discharge, Engage patient in  therapeutic group addressing interpersonal concerns.  Short Term Goals: Engage patient in aftercare planning with referrals and resources and Increase skills for wellness and recovery  Therapeutic Interventions: Assess for all discharge needs, 1 to 1 time with Social worker, Explore available resources and support systems, Assess for adequacy in community support network, Educate family and significant other(s) on suicide prevention, Complete Psychosocial Assessment, Interpersonal group therapy.  Evaluation of Outcomes: Progressing   Progress in Treatment: Attending groups: No. Participating in groups: No. Taking medication as prescribed: Yes. Toleration medication: Yes. Family/Significant other contact made: Yes, individual(s) contacted:  [name] Patient understands diagnosis: Yes. Discussing patient identified problems/goals with staff: Yes. Medical problems stabilized or resolved: Yes. Denies suicidal/homicidal ideation: Yes. Issues/concerns per patient self-inventory: No. Other: None  New problem(s) identified: No, Describe:  none  New Short Term/Long Term Goal(s):  Patient Goals:    Discharge Plan or Barriers: Dr Collie Siad, CBC  Reason for Continuation of Hospitalization: Medication stabilization  Estimated Length of Stay:  Last [place] Suicide Severity Risk Score: Flowsheet Row Admission (Current) from 08/01/2022 in [place] ED from 07/31/2022 in [facility] Emergency Department at [facility] Admission (Discharged) from 03/16/2022 in [place]  CATEGORY No Risk No Risk No Risk       Last PHQ 2/9 Scores:     No data to display          Scribe for Treatment Team: Deirdre Evener 08/06/2022 10:42 AM

## 2022-08-06 NOTE — BHH Group Notes (Signed)
[place] Group Notes:  (Nursing/MHT/Case Management/Adjunct)  Date:  08/06/2022  Time:  4:13 PM  Type of Therapy:  Psychoeducational Skills  Participation Level:  Did Not Attend    [name] [facility] 08/06/2022, 4:13 PM

## 2022-08-06 NOTE — Progress Notes (Signed)
Patient alert and oriented x 4, affect is blunted, thoughts are organized and coherent. 15 minutes safety checks maintained will continue to monitor closely. No distress noted.

## 2022-08-07 DIAGNOSIS — F250 Schizoaffective disorder, bipolar type: Secondary | ICD-10-CM | POA: Diagnosis not present

## 2022-08-07 MED ORDER — LIDOCAINE 5 % EX PTCH
1.0000 | MEDICATED_PATCH | CUTANEOUS | Status: DC
  Administered 2022-08-07: 1 via TRANSDERMAL
  Filled 2022-08-07 (×3): qty 1

## 2022-08-07 NOTE — Plan of Care (Signed)
Pt began shift highly agitated, demanding, rapid, pressured, tangential speech.  Pt was preoccupied with the loss of her teeth some time ago and calling 9-1-1 to report.  Pt was redirected from calling 9-1-1 for nonemergency concerns.  Pt later apologized for "being all bipolar on you" and began talking about not wanting to lose one of her children.  Pt took PM medications without incident and went to bed with no further outbursts.  Pt awoke one time and stated she was scared of the ghosts in her room but upon reassurance that there are no ghosts in her room pt returned to bed.  Continue to monitor via q 15 minute checks
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
## 2022-08-07 NOTE — Progress Notes (Signed)
D- Patient alert and delusional with depersonalization and derealization of the present. Affect blunted/mood labile. Denies SI/ HI/ AVH, and pain. 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 Early in shift she demanded Suboxone and threatened staff refusing to deescalate- demanding to "call Dr. Delcie Roch and get me some Suboxone". She was given her scheduled Klonopin 3m and was directed to go to her room. She then began to apologize for her behavior with child like responses. Easily redirectable to dayroom to color. No further outbursts and disruption. A- Scheduled medications administered to patient, per MD orders. Support and encouragement provided.  Routine safety checks conducted every 15 minutes without incident. R- No adverse drug reactions noted. Patient compliant with medications and treatment plan, persistently making request. She continues to request Suboxone. Patient is cooperative at present and interacts well with others on the unit. Conflict is usually between staff and patient when boundaries are set. Patient is safe on the unit at this time.

## 2022-08-07 NOTE — BHH Group Notes (Signed)
LCSW Group Therapy Note  08/07/2022 1230pm  Type of Therapy/Topic:  Group Therapy:  Balance in Life  Participation Level:  Did Not Attend  Description of Group:    This group will address the concept of balance and how it feels and looks when one is unbalanced. Patients will be encouraged to process areas in their lives that are out of balance and identify reasons for remaining unbalanced. Facilitators will guide patients in utilizing problem-solving interventions to address and correct the stressor making their life unbalanced. Understanding and applying boundaries will be explored and addressed for obtaining and maintaining a balanced life. Patients will be encouraged to explore ways to assertively make their unbalanced needs known to significant others in their lives, using other group members and facilitator for support and feedback.  Therapeutic Goals: Patient will identify two or more emotions or situations they have that consume much of in their lives. Patient will identify signs/triggers that life has become out of balance:  Patient will identify two ways to set boundaries in order to achieve balance in their lives:  Patient will demonstrate ability to communicate their needs through discussion and/or role plays  Summary of Patient Progress:      Therapeutic Modalities:   Cognitive Behavioral Therapy Solution-Focused Therapy Assertiveness Training  Deirdre Evener 08/07/2022 1:23 PM

## 2022-08-07 NOTE — Progress Notes (Signed)
[facility] MD Progress Note  08/07/2022 11:29 AM [name]  MRN:  [id number] Subjective: [name] is seen on rounds.  She complains of back pain.  She has been compliant with medications.  I discussed lidocaine patch with her and she would like to try it. Principal Problem: Schizoaffective disorder, bipolar type (HCC) Diagnosis: Principal Problem:   Schizoaffective disorder, bipolar type ([place]) Active Problems:   Seizure ([place])   Hypokalemia  Total Time spent with patient: 15 minutes  Past Psychiatric History: Schizoaffective disorder  Past Medical History:  Past Medical History:  Diagnosis Date   Degeneration of lumbar intervertebral disc    Depression    DVT (deep vein thrombosis) in pregnancy    Schizoaffective disorder (HCC)    Seizures (HCC)    Shoulder pain, right     Past Surgical History:  Procedure Laterality Date   CESAREAN SECTION     X2   Family History:  Family History  Problem Relation Age of Onset   Cancer Mother    Family Psychiatric  History: Unremarkable Social History:  Social History   Substance and Sexual Activity  Alcohol Use No     Social History   Substance and Sexual Activity  Drug Use Yes   Types: Marijuana   Comment: reports daily cannabis use    Social History   Socioeconomic History   Marital status: Divorced    Spouse name: Not on file   Number of children: Not on file   Years of education: Not on file   Highest education level: Not on file  Occupational History   Occupation: unemployed  Tobacco Use   Smoking status: Every Day    Packs/day: 1.50    Types: Cigarettes   Smokeless tobacco: Never  Vaping Use   Vaping Use: Never used  Substance and Sexual Activity   Alcohol use: No   Drug use: Yes    Types: Marijuana    Comment: reports daily cannabis use   Sexual activity: Yes    Partners: Male  Other Topics Concern   Not on file  Social History Narrative   Not on file   Social Determinants of Health    Financial Resource Strain: Unknown (11/17/2018)   Overall Financial Resource Strain (CARDIA)    Difficulty of Paying Living Expenses: Patient refused  Food Insecurity: No Food Insecurity (07/31/2022)   Hunger Vital Sign    Worried About Running Out of Food in the Last Year: Never true    Ran Out of Food in the Last Year: Never true  Transportation Needs: Unmet Transportation Needs (08/01/2022)   PRAPARE - Transportation    Lack of Transportation (Medical): No    Lack of Transportation (Non-Medical): Yes  Physical Activity: Unknown (11/17/2018)   Exercise Vital Sign    Days of Exercise per Week: Patient refused    Minutes of Exercise per Session: Patient refused  Stress: Unknown (11/17/2018)   [employer] [place]    Feeling of Stress : Patient refused  Social Connections: Unknown (11/17/2018)   Social Connection and Isolation Panel [NHANES]    Frequency of Communication with Friends and Family: Patient refused    Frequency of Social Gatherings with Friends and Family: Patient refused    Attends Religious Services: Patient refused    Active Member of Clubs or Organizations: Patient refused    Attends Archivist Meetings: Patient refused    Marital Status: Patient refused   Additional Social History:  Sleep: Good  Appetite:  Good  Current Medications: Current Facility-Administered Medications  Medication Dose Route Frequency Provider Last Rate Last Admin   acetaminophen (TYLENOL) tablet 650 mg  650 mg Oral Q6H PRN Deloria Lair, NP   650 mg at 08/07/22 0922   alum & mag hydroxide-simeth (MAALOX/MYLANTA) 200-200-20 MG/5ML suspension 30 mL  30 mL Oral Q4H PRN Deloria Lair, NP   30 mL at 08/05/22 1833   clonazePAM (KLONOPIN) tablet 1 mg  1 mg Oral QID Clapacs, John T, MD   1 mg at 08/07/22 1127   hydrOXYzine (ATARAX) tablet 25 mg  25 mg Oral TID PRN Parks Ranger, DO   25  mg at 08/07/22 [id number]   ibuprofen (ADVIL) tablet 600 mg  600 mg Oral Q6H PRN Clapacs, John T, MD   600 mg at 08/06/22 2104   influenza vac split quadrivalent PF (FLUARIX) injection 0.5 mL  0.5 mL Intramuscular Tomorrow-1000 Clapacs, John T, MD       lacosamide (VIMPAT) tablet 50 mg  50 mg Oral BID Doren Custard, Rashaun M, NP   50 mg at 08/07/22 0806   lidocaine (LIDODERM) 5 % 1 patch  1 patch Transdermal Q24H Parks Ranger, DO   1 patch at 08/07/22 1123   magnesium hydroxide (MILK OF MAGNESIA) suspension 30 mL  30 mL Oral Daily PRN Deloria Lair, NP   30 mL at 08/05/22 1834   nicotine (NICODERM CQ - dosed in mg/24 hours) patch 21 mg  21 mg Transdermal Daily Clapacs, John T, MD   21 mg at 08/07/22 1017   QUEtiapine (SEROQUEL) tablet 800 mg  800 mg Oral QHS Clapacs, John T, MD   800 mg at 08/06/22 2103   traZODone (DESYREL) tablet 50 mg  50 mg Oral QHS PRN Deloria Lair, NP   50 mg at 08/04/22 2057    Lab Results: No results found for this or any previous visit (from the past 48 hour(s)).  Blood Alcohol level:  Lab Results  Component Value Date   ETH <10 07/31/2022   ETH <10 123456    Metabolic Disorder Labs: Lab Results  Component Value Date   HGBA1C 5.2 08/02/2022   MPG 102.54 08/02/2022   MPG 102.54 03/17/2022   No results found for: "PROLACTIN" Lab Results  Component Value Date   CHOL 192 08/02/2022   TRIG 270 (H) 08/02/2022   HDL 37 (L) 08/02/2022   CHOLHDL 5.2 08/02/2022   VLDL 54 (H) 08/02/2022   LDLCALC 101 (H) 08/02/2022   LDLCALC 206 (H) 03/17/2022    Physical Findings: AIMS: Facial and Oral Movements Muscles of Facial Expression: None, normal Lips and Perioral Area: None, normal Jaw: None, normal Tongue: None, normal,Extremity Movements Upper (arms, wrists, hands, fingers): None, normal Lower (legs, knees, ankles, toes): None, normal, Trunk Movements Neck, shoulders, hips: None, normal, Overall Severity Severity of abnormal movements (highest score  from questions above): None, normal Incapacitation due to abnormal movements: None, normal Patient's awareness of abnormal movements (rate only patient's report): No Awareness, Dental Status Current problems with teeth and/or dentures?: No Does patient usually wear dentures?: No  CIWA:    COWS:     Musculoskeletal: Strength & Muscle Tone: within normal limits Gait & Station: normal Patient leans: N/A  Psychiatric Specialty Exam:  Presentation  General Appearance:  Bizarre  Eye Contact: Fair  Speech: Clear and Coherent  Speech Volume: Normal  Handedness: Right   Mood and Affect  Mood: Dysphoric  Affect:  Inappropriate   Thought Process  Thought Processes: Irrevelant  Descriptions of Associations:Tangential  Orientation:Full (Time, Place and Person)  Thought Content:Illogical  History of Schizophrenia/Schizoaffective disorder:No  Duration of Psychotic Symptoms:No data recorded Hallucinations:No data recorded Ideas of Reference:Paranoia  Suicidal Thoughts:No data recorded Homicidal Thoughts:No data recorded  Sensorium  Memory: Immediate Poor  Judgment: Impaired  Insight: Lacking   Executive Functions  Concentration: Fair  Attention Span: Fair  Recall: [place] of Knowledge: Fair  Language: Fair   Psychomotor Activity  Psychomotor Activity:No data recorded  Assets  Assets: Desire for Improvement; Financial Resources/Insurance; Housing; Social Support   Sleep  Sleep:No data recorded    Blood pressure 113/81, pulse (!) 101, temperature 97.7 F (36.5 C), temperature source Oral, resp. rate 18, height 5' 1"$  (1.549 m), weight 69.9 kg, SpO2 100 %. Body mass index is 29.1 kg/m.   Treatment Plan Summary: Daily contact with patient to assess and evaluate symptoms and progress in treatment, Medication management, and Plan 5% lidocaine patch daily.  Continue current medications.  Parks Ranger, DO 08/07/2022, 11:29  AM

## 2022-08-08 DIAGNOSIS — F250 Schizoaffective disorder, bipolar type: Secondary | ICD-10-CM | POA: Diagnosis not present

## 2022-08-08 MED ORDER — BUPRENORPHINE HCL-NALOXONE HCL 8-2 MG SL SUBL
1.0000 | SUBLINGUAL_TABLET | Freq: Two times a day (BID) | SUBLINGUAL | Status: DC
  Administered 2022-08-08 – 2022-08-11 (×6): 1 via SUBLINGUAL
  Filled 2022-08-08 (×6): qty 1

## 2022-08-08 MED ORDER — BUPRENORPHINE HCL-NALOXONE HCL 8-2 MG SL SUBL
1.0000 | SUBLINGUAL_TABLET | Freq: Every day | SUBLINGUAL | Status: DC
  Administered 2022-08-08: 1 via SUBLINGUAL
  Filled 2022-08-08: qty 1

## 2022-08-08 MED ORDER — METHOCARBAMOL 500 MG PO TABS
500.0000 mg | ORAL_TABLET | Freq: Three times a day (TID) | ORAL | Status: DC
  Administered 2022-08-08 – 2022-08-11 (×10): 500 mg via ORAL
  Filled 2022-08-08 (×10): qty 1

## 2022-08-08 NOTE — Group Note (Signed)
[facility] LCSW Group Therapy Note    Group Date: 08/08/2022 Start Time: 1300 End Time: 1400  Type of Therapy and Topic:  Group Therapy:  Overcoming Obstacles  Participation Level:  [facility] PARTICIPATION LEVEL: Minimal   Description of Group:   In this group patients will be encouraged to explore what they see as obstacles to their own wellness and recovery. They will be guided to discuss their thoughts, feelings, and behaviors related to these obstacles. The group will process together ways to cope with barriers, with attention given to specific choices patients can make. Each patient will be challenged to identify changes they are motivated to make in order to overcome their obstacles. This group will be process-oriented, with patients participating in exploration of their own experiences as well as giving and receiving support and challenge from other group members.  Therapeutic Goals: 1. Patient will identify personal and current obstacles as they relate to admission. 2. Patient will identify barriers that currently interfere with their wellness or overcoming obstacles.  3. Patient will identify feelings, thought process and behaviors related to these barriers. 4. Patient will identify two changes they are willing to make to overcome these obstacles:    Summary of Patient Progress Patient did not spend much time in the group room. She was not involved in the discussion and after returning to the class from using the bathroom she became disruptive. However, pt was open to guidance from facilitator around group decorum/respect. She did not appear to attend to the conversation and spent the majority of her time working on her own art project/telling people about her art.   Therapeutic Modalities:   Cognitive Behavioral Therapy Solution Focused Therapy Motivational Interviewing Relapse Prevention Therapy   Shirl Harris, LCSW

## 2022-08-08 NOTE — [facility] Group Notes (Signed)
[facility] Group Notes:  (Nursing/MHT/Case Management/Adjunct)  Date:  08/08/2022  Time:  4:20 PM  Type of Therapy:  Psychoeducational Skills  Participation Level:  Active  Participation Quality:  Appropriate  Affect:  Appropriate  Cognitive:  Alert and Appropriate  Insight:  Appropriate  Engagement in Group:  Engaged  Modes of Intervention:  Activity  Summary of Progress/Problems:  [name] 08/08/2022, 4:20 PM

## 2022-08-08 NOTE — Plan of Care (Signed)
D: Patient alert and oriented. Patient rates pain 10/10 for back pain. Patient states pain is 3/10 when reassessed. Patient states that "lidocaine makes me hurt worse and ibuprofen doesn't work for me." Patient denies depression. Patient endorses anxiety. Patient denies SI/HI/AVH. Patient mood is labile. Patient becomes tearful after being told it is not time for certain medications. Patient became apologetic for behavior before receiving first dose of scheduled suboxone. Patient sat interrupted another patients admission assessment asking for the time of next suboxone dose. After being told that the next scheduled dose is for tomorrow patient then sat in front of the doctors office stating that she needs another dose at 8pm. Patient mood has been pleasant after receiving medication.   A: Scheduled medications administered to patient, per MD orders.  Support and encouragement provided to patient.  Q15 minute safety checks maintained.   R: Patient compliant with medication administration and treatment plan. No adverse drug reactions noted. Patient remains safe on the unit at this time. Problem: Education: Goal: Knowledge of General Education information will improve Description: Including pain rating scale, medication(s)/side effects and non-pharmacologic comfort measures Outcome: Progressing   Problem: Education: Goal: Knowledge of the prescribed therapeutic regimen will improve Outcome: Progressing   Problem: Health Behavior/Discharge Planning: Goal: Compliance with prescribed medication regimen will improve Outcome: Progressing   Problem: Role Relationship: Goal: Ability to communicate needs accurately will improve Outcome: Progressing

## 2022-08-08 NOTE — Group Note (Signed)
Recreation Therapy Group Note   Group Topic:Coping Skills  Group Date: 08/08/2022 Start Time: 1000 End Time: 1040 Facilitators: Vilma Prader, LRT, CTRS Location:  Craft Room  Group Description:  Mind Map.  Patient was provided a blank template of a diagram with 32 blank boxes in a tiered system, branching from the center (similar to a bubble chart). LRT directed patients to label the middle of the diagram "Coping Skills". LRT and patients then came up with 8 different coping skills as examples. Pt were directed to record their coping skills in the 2nd tier boxes closest to the center.  Patients would then share their coping skills with the group as LRT wrote them on the board.  Affect/Mood: Full range   Participation Level: Moderate   Participation Quality: Minimal Cues   Behavior: Alert   Speech/Thought Process: Disorganized   Insight: Limited   Judgement: Limited   Modes of Intervention: Activity and Worksheet   Patient Response to Interventions:  Interested    Education Outcome:  In group clarification offered    Clinical Observations/Individualized Feedback: Avree was somewhat active in their participation of session activities and group discussion. Pt identified "music" as a coping skill. Pt also shared that she wanted "the internet to be clean because I do not want my daughter to log in and see a penis on the computer!" Pt able to be redirected and engaged appropriately with peers.   Plan: Continue to engage patient in RT group sessions 2-3x/week.   Vilma Prader, LRT, CTRS 08/08/2022 11:15 AM

## 2022-08-08 NOTE — Progress Notes (Signed)
[facility] MD Progress Note  08/08/2022 10:27 AM [name]  MRN:  [id number] Subjective: Follow-up patient with schizoaffective disorder.  Patient very tearful today saying that her back is in great pain.  This is new to me.  She does not report any injury over the weekend.  Last time I saw her she was not complaining of the pain nearly so much.  She is asking me to start narcotic pain medicine.  Mood is still labile and tearful at times less agitated but getting closer to baseline emotionally. Principal Problem: Schizoaffective disorder, bipolar type ([place]) Diagnosis: Principal Problem:   Schizoaffective disorder, bipolar type ([place]) Active Problems:   Seizure ([place])   Hypokalemia  Total Time spent with patient: 30 minutes  Past Psychiatric History: Past history of schizoaffective disorder  Past Medical History:  Past Medical History:  Diagnosis Date   Degeneration of lumbar intervertebral disc    Depression    DVT (deep vein thrombosis) in pregnancy    Schizoaffective disorder (HCC)    Seizures (HCC)    Shoulder pain, right     Past Surgical History:  Procedure Laterality Date   CESAREAN SECTION     X2   Family History:  Family History  Problem Relation Age of Onset   Cancer Mother    Family Psychiatric  History: See previous Social History:  Social History   Substance and Sexual Activity  Alcohol Use No     Social History   Substance and Sexual Activity  Drug Use Yes   Types: Marijuana   Comment: reports daily cannabis use    Social History   Socioeconomic History   Marital status: Divorced    Spouse name: Not on file   Number of children: Not on file   Years of education: Not on file   Highest education level: Not on file  Occupational History   Occupation: unemployed  Tobacco Use   Smoking status: Every Day    Packs/day: 1.50    Types: Cigarettes   Smokeless tobacco: Never  Vaping Use   Vaping Use: Never used  Substance and Sexual Activity    Alcohol use: No   Drug use: Yes    Types: Marijuana    Comment: reports daily cannabis use   Sexual activity: Yes    Partners: Male  Other Topics Concern   Not on file  Social History Narrative   Not on file   Social Determinants of Health   Financial Resource Strain: Unknown (11/17/2018)   Overall Financial Resource Strain (CARDIA)    Difficulty of Paying Living Expenses: Patient refused  Food Insecurity: No Food Insecurity (07/31/2022)   Hunger Vital Sign    Worried About Running Out of Food in the Last Year: Never true    Ran Out of Food in the Last Year: Never true  Transportation Needs: Unmet Transportation Needs (08/01/2022)   PRAPARE - Transportation    Lack of Transportation (Medical): No    Lack of Transportation (Non-Medical): Yes  Physical Activity: Unknown (11/17/2018)   Exercise Vital Sign    Days of Exercise per Week: Patient refused    Minutes of Exercise per Session: Patient refused  Stress: Unknown (11/17/2018)   [employer] [place]    Feeling of Stress : Patient refused  Social Connections: Unknown (11/17/2018)   Social Connection and Isolation Panel [NHANES]    Frequency of Communication with Friends and Family: Patient refused    Frequency of Social Gatherings with  Friends and Family: Patient refused    Attends Religious Services: Patient refused    Marine scientist or Organizations: Patient refused    Attends Music therapist: Patient refused    Marital Status: Patient refused   Additional Social History:                         Sleep: Fair  Appetite:  Fair  Current Medications: Current Facility-Administered Medications  Medication Dose Route Frequency Provider Last Rate Last Admin   acetaminophen (TYLENOL) tablet 650 mg  650 mg Oral Q6H PRN Deloria Lair, NP   650 mg at 08/08/22 0815   alum & mag hydroxide-simeth (MAALOX/MYLANTA) 200-200-20 MG/5ML suspension 30 mL   30 mL Oral Q4H PRN Deloria Lair, NP   30 mL at 08/05/22 1833   buprenorphine-naloxone (SUBOXONE) 8-2 mg per SL tablet 1 tablet  1 tablet Sublingual Daily Anjelina Dung, Madie Reno, MD       clonazePAM [name]) tablet 1 mg  1 mg Oral QID Lynne Righi T, MD   1 mg at 08/08/22 0815   hydrOXYzine (ATARAX) tablet 25 mg  25 mg Oral TID PRN Parks Ranger, DO   25 mg at 08/07/22 2122   ibuprofen (ADVIL) tablet 600 mg  600 mg Oral Q6H PRN Vihana Kydd T, MD   600 mg at 08/07/22 2124   influenza vac split quadrivalent PF (FLUARIX) injection 0.5 mL  0.5 mL Intramuscular Tomorrow-1000 Ambera Fedele T, MD       lacosamide (VIMPAT) tablet 50 mg  50 mg Oral BID Dixon, Rashaun M, NP   50 mg at 08/08/22 0815   lidocaine (LIDODERM) 5 % 1 patch  1 patch Transdermal Q24H Parks Ranger, DO   1 patch at 08/07/22 1123   magnesium hydroxide (MILK OF MAGNESIA) suspension 30 mL  30 mL Oral Daily PRN Deloria Lair, NP   30 mL at 08/05/22 1834   methocarbamol (ROBAXIN) tablet 500 mg  500 mg Oral TID Howell Groesbeck, Madie Reno, MD       nicotine (NICODERM CQ - dosed in mg/24 hours) patch 21 mg  21 mg Transdermal Daily Tyberius Ryner, Madie Reno, MD   21 mg at 08/08/22 0816   QUEtiapine (SEROQUEL) tablet 800 mg  800 mg Oral QHS Tomie Spizzirri T, MD   800 mg at 08/07/22 2122   traZODone (DESYREL) tablet 50 mg  50 mg Oral QHS PRN Deloria Lair, NP   50 mg at 08/04/22 2057    Lab Results: No results found for this or any previous visit (from the past 48 hour(s)).  Blood Alcohol level:  Lab Results  Component Value Date   ETH <10 07/31/2022   ETH <10 123456    Metabolic Disorder Labs: Lab Results  Component Value Date   HGBA1C 5.2 08/02/2022   MPG 102.54 08/02/2022   MPG 102.54 03/17/2022   No results found for: "PROLACTIN" Lab Results  Component Value Date   CHOL 192 08/02/2022   TRIG 270 (H) 08/02/2022   HDL 37 (L) 08/02/2022   CHOLHDL 5.2 08/02/2022   VLDL 54 (H) 08/02/2022   LDLCALC 101 (H) 08/02/2022    LDLCALC 206 (H) 03/17/2022    Physical Findings: AIMS: Facial and Oral Movements Muscles of Facial Expression: None, normal Lips and Perioral Area: None, normal Jaw: None, normal Tongue: None, normal,Extremity Movements Upper (arms, wrists, hands, fingers): None, normal Lower (legs, knees, ankles, toes): None, normal, Trunk  Movements Neck, shoulders, hips: None, normal, Overall Severity Severity of abnormal movements (highest score from questions above): None, normal Incapacitation due to abnormal movements: None, normal Patient's awareness of abnormal movements (rate only patient's report): No Awareness, Dental Status Current problems with teeth and/or dentures?: No Does patient usually wear dentures?: No  CIWA:    COWS:     Musculoskeletal: Strength & Muscle Tone: within normal limits Gait & Station: normal Patient leans: N/A  Psychiatric Specialty Exam:  Presentation  General Appearance:  Bizarre  Eye Contact: Fair  Speech: Clear and Coherent  Speech Volume: Normal  Handedness: Right   Mood and Affect  Mood: Dysphoric  Affect: Inappropriate   Thought Process  Thought Processes: Irrevelant  Descriptions of Associations:Tangential  Orientation:Full (Time, Place and Person)  Thought Content:Illogical  History of Schizophrenia/Schizoaffective disorder:No  Duration of Psychotic Symptoms:No data recorded Hallucinations:No data recorded Ideas of Reference:Paranoia  Suicidal Thoughts:No data recorded Homicidal Thoughts:No data recorded  Sensorium  Memory: Immediate Poor  Judgment: Impaired  Insight: Lacking   Executive Functions  Concentration: Fair  Attention Span: Fair  Recall: [place] of Knowledge: Fair  Language: Fair   Psychomotor Activity  Psychomotor Activity:No data recorded  Assets  Assets: Desire for Improvement; Financial Resources/Insurance; Housing; Social Support   Sleep  Sleep:No data  recorded   Physical Exam: Physical Exam Vitals and nursing note reviewed.  Constitutional:      Appearance: Normal appearance.  HENT:     Head: Normocephalic and atraumatic.     Mouth/Throat:     Pharynx: Oropharynx is clear.  Eyes:     Pupils: Pupils are equal, round, and reactive to light.  Cardiovascular:     Rate and Rhythm: Normal rate and regular rhythm.  Pulmonary:     Effort: Pulmonary effort is normal.     Breath sounds: Normal breath sounds.  Abdominal:     General: Abdomen is flat.     Palpations: Abdomen is soft.  Musculoskeletal:        General: Normal range of motion.  Skin:    General: Skin is warm and dry.  Neurological:     General: No focal deficit present.     Mental Status: She is alert. Mental status is at baseline.  Psychiatric:        Attention and Perception: She is inattentive.        Mood and Affect: Mood normal. Affect is labile and tearful.        Speech: Speech normal.        Behavior: Behavior is agitated. Behavior is not aggressive.        Thought Content: Thought content normal.        Cognition and Memory: Cognition is impaired.    Review of Systems  Constitutional: Negative.   HENT: Negative.    Eyes: Negative.   Respiratory: Negative.    Cardiovascular: Negative.   Gastrointestinal: Negative.   Musculoskeletal:  Positive for back pain.  Skin: Negative.   Neurological: Negative.   Psychiatric/Behavioral:  Positive for depression. Negative for hallucinations, substance abuse and suicidal ideas. The patient is nervous/anxious.    Blood pressure (!) 102/57, pulse (!) 101, temperature 97.9 F (36.6 C), temperature source Oral, resp. rate 18, height 5' 1"$  (1.549 m), weight 69.9 kg, SpO2 97 %. Body mass index is 29.1 kg/m.   Treatment Plan Summary: Medication management and Plan I explained to the patient that I was not willing to start any narcotic pain medicine but then she  said she would like to be back on the Suboxone which she  is usually provided by her outpatient doctor in which we had offered earlier in her stay.  I am fine with restarting the Suboxone 8 mg prescription.  Also added some Robaxin for this pain.  No change to psychiatric medicine.  Lots of reassurance and calming.  Still possibly could be discharged within the next few days.  Alethia Berthold, MD 08/08/2022, 10:27 AM

## 2022-08-09 DIAGNOSIS — F250 Schizoaffective disorder, bipolar type: Secondary | ICD-10-CM | POA: Diagnosis not present

## 2022-08-09 MED ORDER — PAROXETINE HCL 20 MG PO TABS
20.0000 mg | ORAL_TABLET | Freq: Every day | ORAL | Status: DC
  Administered 2022-08-09 – 2022-08-11 (×3): 20 mg via ORAL
  Filled 2022-08-09 (×3): qty 1

## 2022-08-09 NOTE — Plan of Care (Signed)
D- Patient alert and oriented. Patient presented in a preoccupied mood on assessment reporting that she slept good last night and had continued complaints of back pain. Patient rated her back pain a "5/10", in which she requested PRN medication to help with relief. Patient denied SI, HI, AVH at this time. Patient also denied any signs/symptoms of depression and anxiety stating that "my Daddy came to see me yesterday". Per her self-inventory, patient's goal for today is "writing and coloring a new cartoonish book".  A- Scheduled medications administered to patient, per MD orders. Support and encouragement provided.  Routine safety checks conducted every 15 minutes.  Patient informed to notify staff with problems or concerns.  R- No adverse drug reactions noted. Patient contracts for safety at this time. Patient compliant with medications and treatment plan. Patient receptive, calm, and cooperative. Patient interacts well with others on the unit. Patient remains safe at this time.  Problem: Education: Goal: Knowledge of General Education information will improve Description: Including pain rating scale, medication(s)/side effects and non-pharmacologic comfort measures Outcome: Progressing   Problem: Health Behavior/Discharge Planning: Goal: Ability to manage health-related needs will improve Outcome: Progressing   Problem: Clinical Measurements: Goal: Ability to maintain clinical measurements within normal limits will improve Outcome: Progressing Goal: Will remain free from infection Outcome: Progressing Goal: Diagnostic test results will improve Outcome: Progressing Goal: Respiratory complications will improve Outcome: Progressing Goal: Cardiovascular complication will be avoided Outcome: Progressing   Problem: Activity: Goal: Risk for activity intolerance will decrease Outcome: Progressing   Problem: Nutrition: Goal: Adequate nutrition will be maintained Outcome: Progressing    Problem: Coping: Goal: Level of anxiety will decrease Outcome: Progressing   Problem: Elimination: Goal: Will not experience complications related to bowel motility Outcome: Progressing Goal: Will not experience complications related to urinary retention Outcome: Progressing   Problem: Pain Managment: Goal: General experience of comfort will improve Outcome: Progressing   Problem: Safety: Goal: Ability to remain free from injury will improve Outcome: Progressing   Problem: Skin Integrity: Goal: Risk for impaired skin integrity will decrease Outcome: Progressing   Problem: Activity: Goal: Will verbalize the importance of balancing activity with adequate rest periods Outcome: Progressing   Problem: Education: Goal: Will be free of psychotic symptoms Outcome: Progressing Goal: Knowledge of the prescribed therapeutic regimen will improve Outcome: Progressing   Problem: Coping: Goal: Coping ability will improve Outcome: Progressing Goal: Will verbalize feelings Outcome: Progressing   Problem: Health Behavior/Discharge Planning: Goal: Compliance with prescribed medication regimen will improve Outcome: Progressing   Problem: Nutritional: Goal: Ability to achieve adequate nutritional intake will improve Outcome: Progressing   Problem: Role Relationship: Goal: Ability to communicate needs accurately will improve Outcome: Progressing Goal: Ability to interact with others will improve Outcome: Progressing   Problem: Safety: Goal: Ability to redirect hostility and anger into socially appropriate behaviors will improve Outcome: Progressing Goal: Ability to remain free from injury will improve Outcome: Progressing   Problem: Self-Care: Goal: Ability to participate in self-care as condition permits will improve Outcome: Progressing   Problem: Self-Concept: Goal: Will verbalize positive feelings about self Outcome: Progressing   Problem: Education: Goal: Knowledge  of [place] General Education information/materials will improve Outcome: Progressing Goal: Emotional status will improve Outcome: Progressing Goal: Mental status will improve Outcome: Progressing Goal: Verbalization of understanding the information provided will improve Outcome: Progressing   Problem: Activity: Goal: Interest or engagement in activities will improve Outcome: Progressing Goal: Sleeping patterns will improve Outcome: Progressing   Problem: Coping: Goal: Ability to verbalize  frustrations and anger appropriately will improve Outcome: Progressing Goal: Ability to demonstrate self-control will improve Outcome: Progressing   Problem: Health Behavior/Discharge Planning: Goal: Identification of resources available to assist in meeting health care needs will improve Outcome: Progressing Goal: Compliance with treatment plan for underlying cause of condition will improve Outcome: Progressing   Problem: Physical Regulation: Goal: Ability to maintain clinical measurements within normal limits will improve Outcome: Progressing   Problem: Safety: Goal: Periods of time without injury will increase Outcome: Progressing

## 2022-08-09 NOTE — BHH Group Notes (Signed)
[place] Group Notes:  (Nursing/MHT/Case Management/Adjunct)  Date:  08/09/2022  Time:  4:34 AM  Type of Therapy:  Group Therapy  Participation Level:  Active  Participation Quality:  Appropriate, Intrusive, and Monopolizing  Affect:  Depressed, Excited, and Not Congruent  Cognitive:  Alert, Disorganized, and Delusional  Insight:  Improving  Engagement in Group:  Engaged and Monopolizing  Modes of Intervention:  Education  Summary of Progress/Problems:  [name],[name] 08/09/2022, 4:34 AM

## 2022-08-09 NOTE — BHH Group Notes (Signed)
[facility] Group Notes:  (Nursing/MHT/Case Management/Adjunct)  Date:  08/09/2022  Time:  8:05 PM  Type of Therapy:   Wrap up  Participation Level:  Did Not Attend    Summary of Progress/Problems:  [name] 08/09/2022, 8:05 PM

## 2022-08-09 NOTE — Group Note (Signed)
LCSW Group Therapy Note   Group Date: 08/09/2022 Start Time: 1310 End Time: 1400   Type of Therapy and Topic:  Group Therapy: Boundaries  Participation Level:  Did Not Attend  Description of Group: This group will address the use of boundaries in their personal lives. Patients will explore why boundaries are important, the difference between healthy and unhealthy boundaries, and negative and postive outcomes of different boundaries and will look at how boundaries can be crossed.  Patients will be encouraged to identify current boundaries in their own lives and identify what kind of boundary is being set. Facilitators will guide patients in utilizing problem-solving interventions to address and correct types boundaries being used and to address when no boundary is being used. Understanding and applying boundaries will be explored and addressed for obtaining and maintaining a balanced life. Patients will be encouraged to explore ways to assertively make their boundaries and needs known to significant others in their lives, using other group members and facilitator for role play, support, and feedback.  Therapeutic Goals:  1.  Patient will identify areas in their life where setting clear boundaries could be  used to improve their life.  2.  Patient will identify signs/triggers that a boundary is not being respected. 3.  Patient will identify two ways to set boundaries in order to achieve balance in  their lives: 4.  Patient will demonstrate ability to communicate their needs and set boundaries  through discussion and/or role plays  Summary of Patient Progress:   Patient declined to attend group stating "ma'am I am writing my book".   Therapeutic Modalities:   Cognitive Behavioral Therapy Solution-Focused Therapy  Rozann Lesches, LCSWA 08/09/2022  2:15 PM

## 2022-08-09 NOTE — BHH Group Notes (Signed)
[place] Group Notes:  (Nursing/MHT/Case Management/Adjunct)  Date:  08/09/2022  Time:  6:04 PM  Type of Therapy:  Psychoeducational Skills  Participation Level:  Active  Participation Quality:  Appropriate  Affect:  Appropriate  Cognitive:  Appropriate  Insight:  Appropriate  Engagement in Group:  Engaged  Modes of Intervention:  Activity  Summary of Progress/Problems:  [name] [facility] 08/09/2022, 6:04 PM

## 2022-08-09 NOTE — Plan of Care (Signed)
Patient is mildly irritable, tangential at times, denies SI HI AVH.  Engaged in drawing/coloring as coping skills.  Compliant with medications.  Continued monitoring via 15 minute checks.     Problem: Coping: Goal: Ability to demonstrate self-control will improve Outcome: Progressing   Problem: Activity: Goal: Sleeping patterns will improve Outcome: Progressing

## 2022-08-09 NOTE — Progress Notes (Signed)
[facility] MD Progress Note  08/09/2022 1:14 PM [name]  MRN:  [id number] Subjective: [name] is seen on rounds.  She says that he lidocaine patch does not help.  She is pleasant and cooperative.  She has been compliant with her medications.  She does ask about going on Paxil and states that it was helpful in the past. Principal Problem: Schizoaffective disorder, bipolar type ([place]) Diagnosis: Principal Problem:   Schizoaffective disorder, bipolar type ([place]) Active Problems:   Seizure ([place])   Hypokalemia  Total Time spent with patient: 15 minutes  Past Psychiatric History: Schizoaffective disorder  Past Medical History:  Past Medical History:  Diagnosis Date   Degeneration of lumbar intervertebral disc    Depression    DVT (deep vein thrombosis) in pregnancy    Schizoaffective disorder (HCC)    Seizures (HCC)    Shoulder pain, right     Past Surgical History:  Procedure Laterality Date   CESAREAN SECTION     X2   Family History:  Family History  Problem Relation Age of Onset   Cancer Mother    Family Psychiatric  History: Unremarkable Social History:  Social History   Substance and Sexual Activity  Alcohol Use No     Social History   Substance and Sexual Activity  Drug Use Yes   Types: Marijuana   Comment: reports daily cannabis use    Social History   Socioeconomic History   Marital status: Divorced    Spouse name: Not on file   Number of children: Not on file   Years of education: Not on file   Highest education level: Not on file  Occupational History   Occupation: unemployed  Tobacco Use   Smoking status: Every Day    Packs/day: 1.50    Types: Cigarettes   Smokeless tobacco: Never  Vaping Use   Vaping Use: Never used  Substance and Sexual Activity   Alcohol use: No   Drug use: Yes    Types: Marijuana    Comment: reports daily cannabis use   Sexual activity: Yes    Partners: Male  Other Topics Concern   Not on file  Social History  Narrative   Not on file   Social Determinants of Health   Financial Resource Strain: Unknown (11/17/2018)   Overall Financial Resource Strain (CARDIA)    Difficulty of Paying Living Expenses: Patient refused  Food Insecurity: No Food Insecurity (07/31/2022)   Hunger Vital Sign    Worried About Running Out of Food in the Last Year: Never true    [place] in the Last Year: Never true  Transportation Needs: Unmet Transportation Needs (08/01/2022)   PRAPARE - Transportation    Lack of Transportation (Medical): No    Lack of Transportation (Non-Medical): Yes  Physical Activity: Unknown (11/17/2018)   Exercise Vital Sign    Days of Exercise per Week: Patient refused    Minutes of Exercise per Session: Patient refused  Stress: Unknown (11/17/2018)   [employer] [place]    Feeling of Stress : Patient refused  Social Connections: Unknown (11/17/2018)   Social Connection and Isolation Panel [NHANES]    Frequency of Communication with Friends and Family: Patient refused    Frequency of Social Gatherings with Friends and Family: Patient refused    Attends Religious Services: Patient refused    Active Member of Clubs or Organizations: Patient refused    Attends Archivist Meetings: Patient refused  Marital Status: Patient refused   Additional Social History:                         Sleep: Negative  Appetite:  Good  Current Medications: Current Facility-Administered Medications  Medication Dose Route Frequency Provider Last Rate Last Admin   acetaminophen (TYLENOL) tablet 650 mg  650 mg Oral Q6H PRN Deloria Lair, NP   650 mg at 08/09/22 0801   alum & mag hydroxide-simeth (MAALOX/MYLANTA) 200-200-20 MG/5ML suspension 30 mL  30 mL Oral Q4H PRN Deloria Lair, NP   30 mL at 08/05/22 1833   buprenorphine-naloxone (SUBOXONE) 8-2 mg per SL tablet 1 tablet  1 tablet Sublingual BID Clapacs, Madie Ren[place], MD   1 tablet  at 08/09/22 0801   clonazePAM (KLONOPIN) tablet 1 mg  1 mg Oral QID Clapacs, John T, MD   1 mg at 08/09/22 1214   hydrOXYzine (ATARAX) tablet 25 mg  25 mg Oral TID PRN Parks Ranger, DO   25 mg at 08/09/22 1215   ibuprofen (ADVIL) tablet 600 mg  600 mg Oral Q6H PRN Clapacs, John T, MD   600 mg at 08/09/22 1214   influenza vac split quadrivalent PF (FLUARIX) injection 0.5 mL  0.5 mL Intramuscular Tomorrow-1000 Clapacs, John T, MD       lacosamide (VIMPAT) tablet 50 mg  50 mg Oral BID Dixon, Rashaun M, NP   50 mg at 08/09/22 0801   lidocaine (LIDODERM) 5 % 1 patch  1 patch Transdermal Q24H Parks Ranger, DO   1 patch at 08/07/22 1123   magnesium hydroxide (MILK OF MAGNESIA) suspension 30 mL  30 mL Oral Daily PRN Deloria Lair, NP   30 mL at 08/05/22 1834   methocarbamol (ROBAXIN) tablet 500 mg  500 mg Oral TID Clapacs, John T, MD   500 mg at 08/09/22 1214   nicotine (NICODERM CQ - dosed in mg/24 hours) patch 21 mg  21 mg Transdermal Daily Clapacs, John T, MD   21 mg at 08/09/22 0802   QUEtiapine (SEROQUEL) tablet 800 mg  800 mg Oral QHS Clapacs, John T, MD   800 mg at 08/08/22 2126   traZODone (DESYREL) tablet 50 mg  50 mg Oral QHS PRN Deloria Lair, NP   50 mg at 08/04/22 2057    Lab Results: No results found for this or any previous visit (from the past 48 hour(s)).  Blood Alcohol level:  Lab Results  Component Value Date   ETH <10 07/31/2022   ETH <10 123456    Metabolic Disorder Labs: Lab Results  Component Value Date   HGBA1C 5.2 08/02/2022   MPG 102.54 08/02/2022   MPG 102.54 03/17/2022   No results found for: "PROLACTIN" Lab Results  Component Value Date   CHOL 192 08/02/2022   TRIG 270 (H) 08/02/2022   HDL 37 (L) 08/02/2022   CHOLHDL 5.2 08/02/2022   VLDL 54 (H) 08/02/2022   LDLCALC 101 (H) 08/02/2022   LDLCALC 206 (H) 03/17/2022    Physical Findings: AIMS: Facial and Oral Movements Muscles of Facial Expression: None, normal Lips and  Perioral Area: None, normal Jaw: None, normal Tongue: None, normal,Extremity Movements Upper (arms, wrists, hands, fingers): None, normal Lower (legs, knees, ankles, toes): None, normal, Trunk Movements Neck, shoulders, hips: None, normal, Overall Severity Severity of abnormal movements (highest score from questions above): None, normal Incapacitation due to abnormal movements: None, normal Patient's awareness of abnormal movements (  rate only patient's report): No Awareness, Dental Status Current problems with teeth and/or dentures?: No Does patient usually wear dentures?: No  CIWA:    COWS:     Musculoskeletal: Strength & Muscle Tone: within normal limits Gait & Station: normal Patient leans: N/A  Psychiatric Specialty Exam:  Presentation  General Appearance:  Bizarre  Eye Contact: Fair  Speech: Clear and Coherent  Speech Volume: Normal  Handedness: Right   Mood and Affect  Mood: Dysphoric  Affect: Inappropriate   Thought Process  Thought Processes: Irrevelant  Descriptions of Associations:Tangential  Orientation:Full (Time, Place and Person)  Thought Content:Illogical  History of Schizophrenia/Schizoaffective disorder:No  Duration of Psychotic Symptoms:No data recorded Hallucinations:No data recorded Ideas of Reference:Paranoia  Suicidal Thoughts:No data recorded Homicidal Thoughts:No data recorded  Sensorium  Memory: Immediate Poor  Judgment: Impaired  Insight: Lacking   Executive Functions  Concentration: Fair  Attention Span: Fair  Recall: [place] of Knowledge: Fair  Language: Fair   Psychomotor Activity  Psychomotor Activity:No data recorded  Assets  Assets: Desire for Improvement; Financial Resources/Insurance; Housing; Social Support   Sleep  Sleep:No data recorded   Blood pressure 107/67, pulse (!) 111, temperature 97.9 F (36.6 C), temperature source Oral, resp. rate 18, height 5' 1"$  (1.549 m),  weight 69.9 kg, SpO2 96 %. Body mass index is 29.1 kg/m.   Treatment Plan Summary: Daily contact with patient to assess and evaluate symptoms and progress in treatment, Medication management, and Plan discontinue the lidocaine patch.  Start 20 mg of Paxil.  [place], DO 08/09/2022, 1:14 PM

## 2022-08-09 NOTE — Group Note (Signed)
Recreation Therapy Group Note   Group Topic:Problem Solving  Group Date: 08/09/2022 Start Time: 1000 End Time: 1055 Facilitators: Vilma Prader, LRT, CTRS Location:  Craft Room  Group Description: Life Boat. Patients were given the scenario that they are on a boat that is about to become shipwrecked, leaving them stranded on an [place]. They are asked to make a list of 15 different items that they want to take with them when they are stranded on the [place]. Patients are asked to rank their items from most important to least important, #1 being the most important and #15 being the least. Patients will work individually for the first round to come up with 15 items and then pair up with a peer(s) to condense their list and come up with one list of 15 items between the two of them. Patients or LRT will read aloud the 15 different items to the group after each round.   Affect/Mood: N/A   Participation Level: Did not attend    Clinical Observations/Individualized Feedback: [name] did not attend group due to being in her room resting.   Plan: Continue to engage patient in RT group sessions 2-3x/week.   Vilma Prader, LRT, CTRS  08/09/2022 11:25 AM

## 2022-08-10 DIAGNOSIS — F250 Schizoaffective disorder, bipolar type: Secondary | ICD-10-CM | POA: Diagnosis not present

## 2022-08-10 NOTE — BHH Group Notes (Signed)
[place] Group Notes:  (Nursing/MHT/Case Management/Adjunct)  Date:  08/10/2022  Time:  9:57 AM  Type of Therapy:   Community Group  Participation Level:  Did Not Attend  Participation Quality:    Affect:    Cognitive:    Insight:    Engagement in Group:    Modes of Intervention:    Summary of Progress/Problems:  [name] 08/10/2022, 9:57 AM

## 2022-08-10 NOTE — BHH Group Notes (Signed)
[place] Group Notes:  (Nursing/MHT/Case Management/Adjunct)  Date:  08/10/2022  Time:  4:26 PM  Type of Therapy:  Psychoeducational Skills  Participation Level:  Did Not Attend    [name] [facility] 08/10/2022, 4:26 PM

## 2022-08-10 NOTE — Plan of Care (Signed)
  Problem: Education: Goal: Knowledge of General Education information will improve Description: Including pain rating scale, medication(s)/side effects and non-pharmacologic comfort measures Outcome: Progressing   Problem: Health Behavior/Discharge Planning: Goal: Ability to manage health-related needs will improve Outcome: Progressing   Problem: Clinical Measurements: Goal: Ability to maintain clinical measurements within normal limits will improve Outcome: Progressing Goal: Will remain free from infection Outcome: Progressing Goal: Diagnostic test results will improve Outcome: Progressing Goal: Respiratory complications will improve Outcome: Progressing Goal: Cardiovascular complication will be avoided Outcome: Progressing   Problem: Activity: Goal: Risk for activity intolerance will decrease Outcome: Progressing   Problem: Nutrition: Goal: Adequate nutrition will be maintained Outcome: Progressing   Problem: Coping: Goal: Level of anxiety will decrease Outcome: Progressing   Problem: Elimination: Goal: Will not experience complications related to bowel motility Outcome: Progressing Goal: Will not experience complications related to urinary retention Outcome: Progressing   Problem: Pain Managment: Goal: General experience of comfort will improve Outcome: Progressing   Problem: Safety: Goal: Ability to remain free from injury will improve Outcome: Progressing   Problem: Skin Integrity: Goal: Risk for impaired skin integrity will decrease Outcome: Progressing   Problem: Activity: Goal: Will verbalize the importance of balancing activity with adequate rest periods Outcome: Progressing   Problem: Education: Goal: Will be free of psychotic symptoms Outcome: Progressing Goal: Knowledge of the prescribed therapeutic regimen will improve Outcome: Progressing   Problem: Coping: Goal: Coping ability will improve Outcome: Progressing Goal: Will verbalize  feelings Outcome: Progressing   Problem: Health Behavior/Discharge Planning: Goal: Compliance with prescribed medication regimen will improve Outcome: Progressing   Problem: Nutritional: Goal: Ability to achieve adequate nutritional intake will improve Outcome: Progressing   Problem: Role Relationship: Goal: Ability to communicate needs accurately will improve Outcome: Progressing Goal: Ability to interact with others will improve Outcome: Progressing   Problem: Safety: Goal: Ability to redirect hostility and anger into socially appropriate behaviors will improve Outcome: Progressing Goal: Ability to remain free from injury will improve Outcome: Progressing   Problem: Self-Care: Goal: Ability to participate in self-care as condition permits will improve Outcome: Progressing   Problem: Self-Concept: Goal: Will verbalize positive feelings about self Outcome: Progressing   Problem: Education: Goal: Knowledge of [place] General Education information/materials will improve Outcome: Progressing Goal: Emotional status will improve Outcome: Progressing Goal: Mental status will improve Outcome: Progressing Goal: Verbalization of understanding the information provided will improve Outcome: Progressing   Problem: Activity: Goal: Interest or engagement in activities will improve Outcome: Progressing Goal: Sleeping patterns will improve Outcome: Progressing   Problem: Coping: Goal: Ability to verbalize frustrations and anger appropriately will improve Outcome: Progressing Goal: Ability to demonstrate self-control will improve Outcome: Progressing   Problem: Health Behavior/Discharge Planning: Goal: Identification of resources available to assist in meeting health care needs will improve Outcome: Progressing Goal: Compliance with treatment plan for underlying cause of condition will improve Outcome: Progressing   Problem: Physical Regulation: Goal: Ability to  maintain clinical measurements within normal limits will improve Outcome: Progressing   Problem: Safety: Goal: Periods of time without injury will increase Outcome: Progressing   

## 2022-08-10 NOTE — Group Note (Signed)
LCSW Group Therapy Note  Group Date: 08/10/2022 Start Time: 1300 End Time: 1400   Type of Therapy and Topic:  Group Therapy - Healthy vs Unhealthy Coping Skills  Participation Level:  Minimal   Description of Group The focus of this group was to determine what unhealthy coping techniques typically are used by group members and what healthy coping techniques would be helpful in coping with various problems. Patients were guided in becoming aware of the differences between healthy and unhealthy coping techniques. Patients were asked to identify 2-3 healthy coping skills they would like to learn to use more effectively.  Therapeutic Goals Patients learned that coping is what human beings do all day long to deal with various situations in their lives Patients defined and discussed healthy vs unhealthy coping techniques Patients identified their preferred coping techniques and identified whether these were healthy or unhealthy Patients determined 2-3 healthy coping skills they would like to become more familiar with and use more often. Patients provided support and ideas to each other   Summary of Patient Progress:   Patient was present for the entirety of group session. Patient participated in opening and closing remarks. However, patient did not contribute at all to the topic of discussion despite encouraged participation.   Therapeutic Modalities Cognitive Behavioral Therapy Motivational Interviewing  Larose Kells 08/10/2022  2:36 PM

## 2022-08-10 NOTE — Group Note (Signed)
Recreation Therapy Group Note   Group Topic:Self-Esteem  Group Date: 08/10/2022 Start Time: 1000 End Time: 1035 Facilitators: Vilma Prader, LRT, CTRS Location:  Dayroom  Group Description: Patients and LRT discussed the importance of self-love and self-esteem. Pt completed a worksheet that helps them identify 24 different strengths and qualities about themselves. Pt encouraged to read aloud at least 3 off their sheet to the group. Pt's then had the choice to play "Positive Affirmation Bingo" afterwards, with journals or stress balls as bingo prizes.   Affect/Mood: N/A   Participation Level: Did not attend    Clinical Observations/Individualized Feedback: Elizia did not attend group due to resting in their room.   Plan: Continue to engage patient in RT group sessions 2-3x/week.   Vilma Prader, LRT, CTRS  08/10/2022 11:11 AM

## 2022-08-10 NOTE — Progress Notes (Signed)
D- Patient alert and oriented x3-4. Affect pleasant/mood euthymic. Denies SI/HI/ AVH. She states pain is better now on Suboxone. States she has chronic back pain and that what she take Suboxone for. States she is being discharged tomorrow. A- Scheduled medications administered to patient, per MD orders. Support and encouragement provided.  Routine safety checks conducted every 15 minutes.  Patient informed to notify staff with problems or concerns. R- No adverse drug reactions noted. Patient compliant with medications and treatment plan. Patient receptive, calm, and cooperative today. Patient interacts well with others on the unit.  Patient contracts for safety and remains safe on the unit at this time.

## 2022-08-10 NOTE — Progress Notes (Signed)
Patient compliant with medications. Wake up asking for "Emergency Suboxone" due to finger pain. Denies SI/HI/A/VH and verbally contracted for Patient. Support and encouragement provided. Q 15 minutes ongoing support and encouragement provided.

## 2022-08-10 NOTE — Progress Notes (Signed)
[facility] MD Progress Note  08/10/2022 3:52 PM [name]  MRN:  [id number] Subjective: Follow-up 42 year old woman with schizoaffective disorder.  Patient reports that she is feeling better.  Still has some lability to her mood but has not been acting out aggressively and is more able to hold lucid conversations.  Denies any current suicidal thinking. Principal Problem: Schizoaffective disorder, bipolar type (HCC) Diagnosis: Principal Problem:   Schizoaffective disorder, bipolar type ([place]) Active Problems:   Seizure ([place])   Hypokalemia  Total Time spent with patient: 30 minutes  Past Psychiatric History: History of schizoaffective disorder  Past Medical History:  Past Medical History:  Diagnosis Date   Degeneration of lumbar intervertebral disc    Depression    DVT (deep vein thrombosis) in pregnancy    Schizoaffective disorder (HCC)    Seizures (HCC)    Shoulder pain, right     Past Surgical History:  Procedure Laterality Date   CESAREAN SECTION     X2   Family History:  Family History  Problem Relation Age of Onset   Cancer Mother    Family Psychiatric  History: Denies Social History:  Social History   Substance and Sexual Activity  Alcohol Use No     Social History   Substance and Sexual Activity  Drug Use Yes   Types: Marijuana   Comment: reports daily cannabis use    Social History   Socioeconomic History   Marital status: Divorced    Spouse name: Not on file   Number of children: Not on file   Years of education: Not on file   Highest education level: Not on file  Occupational History   Occupation: unemployed  Tobacco Use   Smoking status: Every Day    Packs/day: 1.50    Types: Cigarettes   Smokeless tobacco: Never  Vaping Use   Vaping Use: Never used  Substance and Sexual Activity   Alcohol use: No   Drug use: Yes    Types: Marijuana    Comment: reports daily cannabis use   Sexual activity: Yes    Partners: Male  Other Topics  Concern   Not on file  Social History Narrative   Not on file   Social Determinants of Health   Financial Resource Strain: Unknown (11/17/2018)   Overall Financial Resource Strain (CARDIA)    Difficulty of Paying Living Expenses: Patient refused  Food Insecurity: No Food Insecurity (07/31/2022)   Hunger Vital Sign    Worried About Running Out of Food in the Last Year: Never true    Ran Out of Food in the Last Year: Never true  Transportation Needs: Unmet Transportation Needs (08/01/2022)   PRAPARE - Transportation    Lack of Transportation (Medical): No    Lack of Transportation (Non-Medical): Yes  Physical Activity: Unknown (11/17/2018)   Exercise Vital Sign    Days of Exercise per Week: Patient refused    Minutes of Exercise per Session: Patient refused  Stress: Unknown (11/17/2018)   [employer] [place]    Feeling of Stress : Patient refused  Social Connections: Unknown (11/17/2018)   Social Connection and Isolation Panel [NHANES]    Frequency of Communication with Friends and Family: Patient refused    Frequency of Social Gatherings with Friends and Family: Patient refused    Attends Religious Services: Patient refused    Active Member of Clubs or Organizations: Patient refused    Attends Archivist Meetings: Patient refused  Marital Status: Patient refused   Additional Social History:                         Sleep: Fair  Appetite:  Fair  Current Medications: Current Facility-Administered Medications  Medication Dose Route Frequency Provider Last Rate Last Admin   acetaminophen (TYLENOL) tablet 650 mg  650 mg Oral Q6H PRN Deloria Lair, NP   650 mg at 08/10/22 0233   alum & mag hydroxide-simeth (MAALOX/MYLANTA) 200-200-20 MG/5ML suspension 30 mL  30 mL Oral Q4H PRN Deloria Lair, NP   30 mL at 08/05/22 1833   buprenorphine-naloxone (SUBOXONE) 8-2 mg per SL tablet 1 tablet  1 tablet  Sublingual BID Maxxon Schwanke, Madie Reno, MD   1 tablet at 08/10/22 0817   clonazePAM (KLONOPIN) tablet 1 mg  1 mg Oral QID Taris Galindo T, MD   1 mg at 08/10/22 1223   hydrOXYzine (ATARAX) tablet 25 mg  25 mg Oral TID PRN Parks Ranger, DO   25 mg at 08/09/22 2103   ibuprofen (ADVIL) tablet 600 mg  600 mg Oral Q6H PRN Ashleen Demma T, MD   600 mg at 08/09/22 1214   influenza vac split quadrivalent PF (FLUARIX) injection 0.5 mL  0.5 mL Intramuscular Tomorrow-1000 Kinneth Fujiwara T, MD       lacosamide (VIMPAT) tablet 50 mg  50 mg Oral BID Doren Custard, Rashaun M, NP   50 mg at 08/10/22 0817   magnesium hydroxide (MILK OF MAGNESIA) suspension 30 mL  30 mL Oral Daily PRN Deloria Lair, NP   30 mL at 08/05/22 1834   methocarbamol (ROBAXIN) tablet 500 mg  500 mg Oral TID Tatiana Courter T, MD   500 mg at 08/10/22 1223   nicotine (NICODERM CQ - dosed in mg/24 hours) patch 21 mg  21 mg Transdermal Daily Ajani Schnieders T, MD   21 mg at 08/10/22 0818   PARoxetine (PAXIL) tablet 20 mg  20 mg Oral Daily Parks Ranger, DO   20 mg at 08/10/22 0816   QUEtiapine (SEROQUEL) tablet 800 mg  800 mg Oral QHS Emin Foree T, MD   800 mg at 08/09/22 2102   traZODone (DESYREL) tablet 50 mg  50 mg Oral QHS PRN Deloria Lair, NP   50 mg at 08/04/22 2057    Lab Results: No results found for this or any previous visit (from the past 48 hour(s)).  Blood Alcohol level:  Lab Results  Component Value Date   ETH <10 07/31/2022   ETH <10 123456    Metabolic Disorder Labs: Lab Results  Component Value Date   HGBA1C 5.2 08/02/2022   MPG 102.54 08/02/2022   MPG 102.54 03/17/2022   No results found for: "PROLACTIN" Lab Results  Component Value Date   CHOL 192 08/02/2022   TRIG 270 (H) 08/02/2022   HDL 37 (L) 08/02/2022   CHOLHDL 5.2 08/02/2022   VLDL 54 (H) 08/02/2022   LDLCALC 101 (H) 08/02/2022   LDLCALC 206 (H) 03/17/2022    Physical Findings: AIMS: Facial and Oral Movements Muscles of Facial  Expression: None, normal Lips and Perioral Area: None, normal Jaw: None, normal Tongue: None, normal,Extremity Movements Upper (arms, wrists, hands, fingers): None, normal Lower (legs, knees, ankles, toes): None, normal, Trunk Movements Neck, shoulders, hips: None, normal, Overall Severity Severity of abnormal movements (highest score from questions above): None, normal Incapacitation due to abnormal movements: None, normal Patient's awareness of abnormal movements (rate  only patient's report): No Awareness, Dental Status Current problems with teeth and/or dentures?: No Does patient usually wear dentures?: No  CIWA:    COWS:     Musculoskeletal: Strength & Muscle Tone: within normal limits Gait & Station: normal Patient leans: N/A  Psychiatric Specialty Exam:  Presentation  General Appearance:  Bizarre  Eye Contact: Fair  Speech: Clear and Coherent  Speech Volume: Normal  Handedness: Right   Mood and Affect  Mood: Dysphoric  Affect: Inappropriate   Thought Process  Thought Processes: Irrevelant  Descriptions of Associations:Tangential  Orientation:Full (Time, Place and Person)  Thought Content:Illogical  History of Schizophrenia/Schizoaffective disorder:No  Duration of Psychotic Symptoms:No data recorded Hallucinations:No data recorded Ideas of Reference:Paranoia  Suicidal Thoughts:No data recorded Homicidal Thoughts:No data recorded  Sensorium  Memory: Immediate Poor  Judgment: Impaired  Insight: Lacking   Executive Functions  Concentration: Fair  Attention Span: Fair  Recall: [place] of Knowledge: Fair  Language: Fair   Psychomotor Activity  Psychomotor Activity:No data recorded  Assets  Assets: Desire for Improvement; Financial Resources/Insurance; Housing; Social Support   Sleep  Sleep:No data recorded   Physical Exam: Physical Exam Vitals and nursing note reviewed.  Constitutional:      Appearance:  Normal appearance.  HENT:     Head: Normocephalic and atraumatic.     Mouth/Throat:     Pharynx: Oropharynx is clear.  Eyes:     Pupils: Pupils are equal, round, and reactive to light.  Cardiovascular:     Rate and Rhythm: Normal rate and regular rhythm.  Pulmonary:     Effort: Pulmonary effort is normal.     Breath sounds: Normal breath sounds.  Abdominal:     General: Abdomen is flat.     Palpations: Abdomen is soft.  Musculoskeletal:        General: Normal range of motion.  Skin:    General: Skin is warm and dry.  Neurological:     General: No focal deficit present.     Mental Status: She is alert. Mental status is at baseline.  Psychiatric:        Attention and Perception: Attention normal.        Mood and Affect: Mood normal. Affect is labile.        Speech: Speech normal.        Behavior: Behavior is cooperative.        Thought Content: Thought content normal.        Cognition and Memory: Cognition is impaired.    Review of Systems  Constitutional: Negative.   HENT: Negative.    Eyes: Negative.   Respiratory: Negative.    Cardiovascular: Negative.   Gastrointestinal: Negative.   Musculoskeletal: Negative.   Skin: Negative.   Neurological: Negative.   Psychiatric/Behavioral: Negative.  Negative for depression.    Blood pressure 122/78, pulse 96, temperature 97.7 F (36.5 C), temperature source Oral, resp. rate 18, height 5' 1"$  (1.549 m), weight 69.9 kg, SpO2 99 %. Body mass index is 29.1 kg/m.   Treatment Plan Summary: Daily contact with patient to assess and evaluate symptoms and progress in treatment, Medication management, and Plan continue medication.  Reviewed importance of staying on medicine.  Seems to be generally doing much better and I am going to anticipate likely discharge tomorrow.  Alethia Berthold, MD 08/10/2022, 3:52 PM

## 2022-08-11 DIAGNOSIS — F250 Schizoaffective disorder, bipolar type: Secondary | ICD-10-CM | POA: Diagnosis not present

## 2022-08-11 MED ORDER — QUETIAPINE FUMARATE 400 MG PO TABS: 800.0000 mg | ORAL_TABLET | Freq: Every day | ORAL | 1 refills | Status: DC

## 2022-08-11 MED ORDER — PAROXETINE HCL 20 MG PO TABS: 20.0000 mg | ORAL_TABLET | Freq: Every day | ORAL | 0 refills | Status: DC

## 2022-08-11 MED ORDER — NICOTINE 21 MG/24HR TD PT24: 21.0000 mg | MEDICATED_PATCH | Freq: Every day | TRANSDERMAL | 0 refills | Status: AC

## 2022-08-11 MED ORDER — TRAZODONE HCL 50 MG PO TABS: 50.0000 mg | ORAL_TABLET | Freq: Every evening | ORAL | 0 refills | Status: DC | PRN

## 2022-08-11 MED ORDER — BUPRENORPHINE HCL-NALOXONE HCL 8-2 MG SL FILM: 1.0000 | ORAL_FILM | Freq: Two times a day (BID) | SUBLINGUAL | 0 refills | Status: DC

## 2022-08-11 MED ORDER — LACOSAMIDE 50 MG PO TABS: 50.0000 mg | ORAL_TABLET | Freq: Two times a day (BID) | ORAL | 0 refills | Status: DC

## 2022-08-11 MED ORDER — HYDROXYZINE HCL 25 MG PO TABS: 25.0000 mg | ORAL_TABLET | Freq: Three times a day (TID) | ORAL | 0 refills | Status: AC | PRN

## 2022-08-11 MED ORDER — METHOCARBAMOL 500 MG PO TABS: 500.0000 mg | ORAL_TABLET | Freq: Three times a day (TID) | ORAL | 0 refills | Status: DC

## 2022-08-11 MED ORDER — CLONAZEPAM 1 MG PO TABS: 1.0000 mg | ORAL_TABLET | Freq: Four times a day (QID) | ORAL | 0 refills | Status: DC

## 2022-08-11 NOTE — Discharge Summary (Signed)
Physician Discharge Summary Note  Patient:  [name] is an 42 y.o., female MRN:  [id number] DOB:  10-09-1980 Patient phone:  [phone] (home)  Patient address:   [place] [address],  Total Time spent with patient: 30 minutes  Date of Admission:  08/01/2022 Date of Discharge: 08/11/2022  Reason for Admission: Admitted with worsening symptoms of mood instability euphoria or grandiosity typical manic symptoms part of her schizoaffective disorder had become more disruptive at home.  Principal Problem: Schizoaffective disorder, bipolar type [facility]) Discharge Diagnoses: Principal Problem:   Schizoaffective disorder, bipolar type ([place]) Active Problems:   Seizure ([place])   Hypokalemia   Past Psychiatric History: History of schizoaffective disorder with both depressive and manic episodes in the past.  Past history of substance use problems to some extent especially with narcotics.  Chronic pain.  Past Medical History:  Past Medical History:  Diagnosis Date   Degeneration of lumbar intervertebral disc    Depression    DVT (deep vein thrombosis) in pregnancy    Schizoaffective disorder (HCC)    Seizures (HCC)    Shoulder pain, right     Past Surgical History:  Procedure Laterality Date   CESAREAN SECTION     X2   Family History:  Family History  Problem Relation Age of Onset   Cancer Mother    Family Psychiatric  History: See previous Social History:  Social History   Substance and Sexual Activity  Alcohol Use No     Social History   Substance and Sexual Activity  Drug Use Yes   Types: Marijuana   Comment: reports daily cannabis use    Social History   Socioeconomic History   Marital status: Divorced    Spouse name: Not on file   Number of children: Not on file   Years of education: Not on file   Highest education level: Not on file  Occupational History   Occupation: unemployed  Tobacco Use   Smoking status: Every Day     Packs/day: 1.50    Types: Cigarettes   Smokeless tobacco: Never  Vaping Use   Vaping Use: Never used  Substance and Sexual Activity   Alcohol use: No   Drug use: Yes    Types: Marijuana    Comment: reports daily cannabis use   Sexual activity: Yes    Partners: Male  Other Topics Concern   Not on file  Social History Narrative   Not on file   Social Determinants of Health   Financial Resource Strain: Unknown (11/17/2018)   Overall Financial Resource Strain (CARDIA)    Difficulty of Paying Living Expenses: Patient refused  Food Insecurity: No Food Insecurity (07/31/2022)   Hunger Vital Sign    Worried About Running Out of Food in the Last Year: Never true    Ran Out of Food in the Last Year: Never true  Transportation Needs: Unmet Transportation Needs (08/01/2022)   PRAPARE - Transportation    Lack of Transportation (Medical): No    Lack of Transportation (Non-Medical): Yes  Physical Activity: Unknown (11/17/2018)   Exercise Vital Sign    Days of Exercise per Week: Patient refused    Minutes of Exercise per Session: Patient refused  Stress: Unknown (11/17/2018)   [employer] [place]    Feeling of Stress : Patient refused  Social Connections: Unknown (11/17/2018)   Social Connection and Isolation Panel [NHANES]    Frequency of Communication with Friends and  Family: Patient refused    Frequency of Social Gatherings with Friends and Family: Patient refused    Attends Religious Services: Patient refused    Marine scientist or Organizations: Patient refused    Attends Archivist Meetings: Patient refused    Marital Status: Patient refused    Hospital Course: Patient was initially very agitated but was not violent or threatening.  No self harm in the hospital.  Restarted on medications.  She is usually on clonazepam and also on high-dose Seroquel.  Initially started back at lower dose Seroquel that she was  demanding to get back on the 800 mg which she has tolerated well.  Now back on a combination of medicines that has worked for her in the past.  Initially she was refusing the Suboxone but started to complain of more back pain and then realized the Suboxone was helpful for her.  Restarted the Suboxone which she is tolerating fine.  Also added some Robaxin.  Despite multiple medicines with the potential for sedation patient is not over sedated but is awake and active during the day.  Thoughts are more lucid behavior is calm and no sign of acute dangerousness.  Patient counseled about not misusing medicines.  She will be discharged with follow-up with her regular outpatient psychiatric provider  Physical Findings: AIMS: Facial and Oral Movements Muscles of Facial Expression: None, normal Lips and Perioral Area: None, normal Jaw: None, normal Tongue: None, normal,Extremity Movements Upper (arms, wrists, hands, fingers): None, normal Lower (legs, knees, ankles, toes): None, normal, Trunk Movements Neck, shoulders, hips: None, normal, Overall Severity Severity of abnormal movements (highest score from questions above): None, normal Incapacitation due to abnormal movements: None, normal Patient's awareness of abnormal movements (rate only patient's report): No Awareness, Dental Status Current problems with teeth and/or dentures?: No Does patient usually wear dentures?: No  CIWA:    COWS:     Musculoskeletal: Strength & Muscle Tone: within normal limits Gait & Station: normal Patient leans: N/A   Psychiatric Specialty Exam:  Presentation  General Appearance:  Bizarre  Eye Contact: Fair  Speech: Clear and Coherent  Speech Volume: Normal  Handedness: Right   Mood and Affect  Mood: Dysphoric  Affect: Inappropriate   Thought Process  Thought Processes: Irrevelant  Descriptions of Associations:Tangential  Orientation:Full (Time, Place and Person)  Thought  Content:Illogical  History of Schizophrenia/Schizoaffective disorder:No  Duration of Psychotic Symptoms:No data recorded Hallucinations:No data recorded Ideas of Reference:Paranoia  Suicidal Thoughts:No data recorded Homicidal Thoughts:No data recorded  Sensorium  Memory: Immediate Poor  Judgment: Impaired  Insight: Lacking   Executive Functions  Concentration: Fair  Attention Span: Fair  Recall: [place] of Knowledge: Fair  Language: Fair   Psychomotor Activity  Psychomotor Activity:No data recorded  Assets  Assets: Desire for Improvement; Financial Resources/Insurance; Housing; Social Support   Sleep  Sleep:No data recorded   Physical Exam: Physical Exam Vitals and nursing note reviewed.  Constitutional:      Appearance: Normal appearance.  HENT:     Head: Normocephalic and atraumatic.     Mouth/Throat:     Pharynx: Oropharynx is clear.  Eyes:     Pupils: Pupils are equal, round, and reactive to light.  Cardiovascular:     Rate and Rhythm: Normal rate and regular rhythm.  Pulmonary:     Effort: Pulmonary effort is normal.     Breath sounds: Normal breath sounds.  Abdominal:     General: Abdomen is flat.  Palpations: Abdomen is soft.  Musculoskeletal:        General: Normal range of motion.  Skin:    General: Skin is warm and dry.  Neurological:     General: No focal deficit present.     Mental Status: She is alert. Mental status is at baseline.  Psychiatric:        Attention and Perception: Attention normal.        Mood and Affect: Mood normal.        Speech: Speech normal.        Behavior: Behavior normal.        Thought Content: Thought content normal.        Cognition and Memory: Cognition normal.    Review of Systems  Constitutional: Negative.   HENT: Negative.    Eyes: Negative.   Respiratory: Negative.    Cardiovascular: Negative.   Gastrointestinal: Negative.   Musculoskeletal: Negative.   Skin: Negative.    Neurological: Negative.   Psychiatric/Behavioral: Negative.     Blood pressure 116/75, pulse 99, temperature 98 F (36.7 C), temperature source Oral, resp. rate 18, height 5' 1"$  (1.549 m), weight 69.9 kg, SpO2 97 %. Body mass index is 29.1 kg/m.   Social History   Tobacco Use  Smoking Status Every Day   Packs/day: 1.50   Types: Cigarettes  Smokeless Tobacco Never   Tobacco Cessation:  A prescription for an FDA-approved tobacco cessation medication provided at discharge   Blood Alcohol level:  Lab Results  Component Value Date   [facility] <10 07/31/2022   ETH <10 123456    Metabolic Disorder Labs:  Lab Results  Component Value Date   HGBA1C 5.2 08/02/2022   MPG 102.54 08/02/2022   MPG 102.54 03/17/2022   No results found for: "PROLACTIN" Lab Results  Component Value Date   CHOL 192 08/02/2022   TRIG 270 (H) 08/02/2022   HDL 37 (L) 08/02/2022   CHOLHDL 5.2 08/02/2022   VLDL 54 (H) 08/02/2022   LDLCALC 101 (H) 08/02/2022   LDLCALC 206 (H) 03/17/2022    See Psychiatric Specialty Exam and Suicide Risk Assessment completed by Attending Physician prior to discharge.  Discharge destination:  Home  Is patient on multiple antipsychotic therapies at discharge:  No   Has Patient had three or more failed trials of antipsychotic monotherapy by history:  No  Recommended Plan for Multiple Antipsychotic Therapies: NA  Discharge Instructions     Diet - low sodium heart healthy   Complete by: As directed    Increase activity slowly   Complete by: As directed       Allergies as of 08/11/2022       Reactions   Latex Rash, Swelling   Other reaction(s): Unknown Skin turns red   Morphine Other (See Comments), Swelling   Patient states that her reaction is that her skin gets a bit red.   No urticaria or airway difficulty.     Prednisone Other (See Comments)   Other reaction(s): Other (See Comments) Stayed up 30 days when pt. Was on prednisone insomnia   Buprenorphine  Hcl-naloxone Hcl    Duloxetine Other (See Comments)   Unknown Unknown   Ketorolac Tromethamine    Lithium    drool   Tramadol    Other reaction(s): Other (See Comments) Seizures   Poison Ivy Extract [poison Ivy Extract] Rash   Poison Oak Extract [poison Oak Extract] Rash   [place] Rash        Medication List  STOP taking these medications    famotidine 20 MG tablet Commonly known as: PEPCID   polyethylene glycol 17 g packet Commonly known as: MIRALAX / GLYCOLAX   sulfamethoxazole-trimethoprim 800-160 MG tablet Commonly known as: BACTRIM DS       TAKE these medications      Indication  Buprenorphine HCl-Naloxone HCl 8-2 MG Film Place 1 Film under the tongue 2 (two) times daily. What changed: Another medication with the same name was removed. Continue taking this medication, and follow the directions you see here.  Indication: Opioid Dependence   clonazePAM 1 MG tablet Commonly known as: KLONOPIN Take 1 tablet (1 mg total) by mouth 4 (four) times daily. What changed: when to take this  Indication: Feeling Anxious   hydrOXYzine 25 MG tablet Commonly known as: ATARAX Take 1 tablet (25 mg total) by mouth 3 (three) times daily as needed for anxiety (Sleep).  Indication: Feeling Anxious   lacosamide 50 MG Tabs tablet Commonly known as: VIMPAT Take 1 tablet (50 mg total) by mouth 2 (two) times daily.  Indication: Partial Onset Seizure   methocarbamol 500 MG tablet Commonly known as: ROBAXIN Take 1 tablet (500 mg total) by mouth 3 (three) times daily.  Indication: Musculoskeletal Pain   nicotine 21 mg/24hr patch Commonly known as: NICODERM CQ - dosed in mg/24 hours Place 1 patch (21 mg total) onto the skin daily.  Indication: Nicotine Addiction   PARoxetine 20 MG tablet Commonly known as: PAXIL Take 1 tablet (20 mg total) by mouth daily. Start taking on: August 12, 2022 What changed: when to take this  Indication: Depressive Phase of  Manic-Depression   QUEtiapine 400 MG tablet Commonly known as: SEROQUEL Take 2 tablets (800 mg total) by mouth at bedtime. What changed: how much to take  Indication: Depressive Phase of Manic-Depression   traZODone 50 MG tablet Commonly known as: DESYREL Take 1 tablet (50 mg total) by mouth at bedtime as needed for sleep.  Indication: [place], [place]. Schedule an appointment as soon as possible for a visit.   Why: Please make an appointment with your existing outpatient provider, medical records will be sent to your psychiatrist. Contact information: [place] [address] [phone]                 Follow-up recommendations:  Other:  Follow-up with [facility].  Continue current medicine.  Do not misuse medicine.  Make sure you are sleeping regularly and on a good schedule.  Comments: Discharge home today.  Prescription sent to her preferred pharmacy  Signed: Alethia Berthold, MD 08/11/2022, 9:19 AM

## 2022-08-11 NOTE — Progress Notes (Signed)
Patient denies SI, HI & AVH. She did require some redirection to stay on task. She is looking forward to discharge today.

## 2022-08-11 NOTE — BHH Suicide Risk Assessment (Signed)
[facility] Discharge Suicide Risk Assessment   Principal Problem: Schizoaffective disorder, bipolar type [facility]) Discharge Diagnoses: Principal Problem:   Schizoaffective disorder, bipolar type ([place]) Active Problems:   Seizure ([place])   Hypokalemia   Total Time spent with patient: 30 minutes  Musculoskeletal: Strength & Muscle Tone: within normal limits Gait & Station: normal Patient leans: N/A  Psychiatric Specialty Exam  Presentation  General Appearance:  Bizarre  Eye Contact: Fair  Speech: Clear and Coherent  Speech Volume: Normal  Handedness: Right   Mood and Affect  Mood: Dysphoric  Duration of Depression Symptoms: No data recorded Affect: Inappropriate   Thought Process  Thought Processes: Irrevelant  Descriptions of Associations:Tangential  Orientation:Full (Time, Place and Person)  Thought Content:Illogical  History of Schizophrenia/Schizoaffective disorder:No  Duration of Psychotic Symptoms:No data recorded Hallucinations:No data recorded Ideas of Reference:Paranoia  Suicidal Thoughts:No data recorded Homicidal Thoughts:No data recorded  Sensorium  Memory: Immediate Poor  Judgment: Impaired  Insight: Lacking   Executive Functions  Concentration: Fair  Attention Span: Fair  Recall: [place] of Knowledge: Fair  Language: Fair   Psychomotor Activity  Psychomotor Activity:No data recorded  Assets  Assets: Desire for Improvement; Financial Resources/Insurance; Housing; Social Support   Sleep  Sleep:No data recorded  Physical Exam: Physical Exam Vitals and nursing note reviewed.  Constitutional:      Appearance: Normal appearance.  HENT:     Head: Normocephalic and atraumatic.     Mouth/Throat:     Pharynx: Oropharynx is clear.  Eyes:     Pupils: Pupils are equal, round, and reactive to light.  Cardiovascular:     Rate and Rhythm: Normal rate and regular rhythm.  Pulmonary:     Effort: Pulmonary effort is  normal.     Breath sounds: Normal breath sounds.  Abdominal:     General: Abdomen is flat.     Palpations: Abdomen is soft.  Musculoskeletal:        General: Normal range of motion.  Skin:    General: Skin is warm and dry.  Neurological:     General: No focal deficit present.     Mental Status: She is alert. Mental status is at baseline.  Psychiatric:        Attention and Perception: Attention normal.        Mood and Affect: Mood normal.        Speech: Speech normal.        Behavior: Behavior is cooperative.        Thought Content: Thought content normal.        Cognition and Memory: Cognition normal.    Review of Systems  Constitutional: Negative.   HENT: Negative.    Eyes: Negative.   Respiratory: Negative.    Cardiovascular: Negative.   Gastrointestinal: Negative.   Musculoskeletal: Negative.   Skin: Negative.   Neurological: Negative.   Psychiatric/Behavioral: Negative.     Blood pressure 116/75, pulse 99, temperature 98 F (36.7 C), temperature source Oral, resp. rate 18, height 5' 1"$  (1.549 m), weight 69.9 kg, SpO2 97 %. Body mass index is 29.1 kg/m.  Mental Status Per Nursing Assessment::   On Admission:  NA  Demographic Factors:  Caucasian  Loss Factors: NA  Historical Factors: Prior suicide attempts and Impulsivity  Risk Reduction Factors:   Sense of responsibility to family, Living with another person, especially a relative, Positive social support, Positive therapeutic relationship, and Positive coping skills or problem solving skills  Continued Clinical Symptoms:  Bipolar Disorder:  Mixed State Schizophrenia:   Less than 45 years old  Cognitive Features That Contribute To Risk:  None    Suicide Risk:  Minimal: No identifiable suicidal ideation.  Patients presenting with no risk factors but with morbid ruminations; may be classified as minimal risk based on the severity of the depressive symptoms    Plan Of Care/Follow-up recommendations:   Other:  Patient's mood is much improved.  Behavior has been calm and appropriate.  Completely denies suicidal ideation.  Upbeat and optimistic with positive plans after discharge and agrees to treatment.  No longer meets commitment criteria and can be discharged home.  Alethia Berthold, MD 08/11/2022, 9:12 AM

## 2022-08-11 NOTE — Progress Notes (Signed)
Discharge Note:  Patient denies SI/HI/AVH at this time. Discharge instructions, AVS, prescriptions, and transition record gone over with patient. Patient agrees to comply with medication management, follow-up visit, and outpatient therapy. Patient belongings returned to patient. Patient questions and concerns addressed and answered. Patient ambulatory off unit. Patient discharged to home with husband.

## 2022-08-11 NOTE — Group Note (Signed)
Recreation Therapy Group Note   Group Topic:Relaxation  Group Date: 08/11/2022 Start Time: 1000 End Time: 1045 Facilitators: Vilma Prader, LRT, CTRS Location:  Craft Room  Group Description: PMR (Progressive Muscle Relaxation). LRT asks patients their current level of stress/anxiety from 1-10, with 10 being the highest. LRT educates patients on what PMR is and the benefits that come from it. Patients are asked to sit with their feet flat on the floor while sitting up and all the way back in their chair, if possible. LRT follows prompt that requires the patients to tense and release different muscles in their body and focus on their breathing. During session, lights are off and soft music is being played. At the end of the prompt, LRT asks patients to rank their current levels of stress/anxiety from 1-10, 10 being the highest.   Affect/Mood: Appropriate   Participation Level: Active and Engaged   Participation Quality: Independent   Behavior: Appropriate   Speech/Thought Process: Coherent   Insight: Good   Judgement: Good   Modes of Intervention: Activity and Education   Patient Response to Interventions:  Attentive, Engaged, Interested , and Receptive   Education Outcome:  Acknowledges education   Clinical Observations/Individualized Feedback: [name] was active in their participation of session activities and group discussion. Pt came into group irritated with MHT about not being able to have colored pencils. Pt was able to calm down and actively engage in group completing all exercises. Pt identified that their stress level was a 6 and their anxiety level was a 4 at the beginning of group. Pt identified that their stress level was a "6-2" and their anxiety level was a "3-1" after session. At the end of group, pt asked if she could have a copy of the prompt as it is something she wants to do at home. LRT provided pt with resources.   Plan: Continue to engage patient in RT  group sessions 2-3x/week.   Vilma Prader, LRT, CTRS  08/11/2022 11:10 AM

## 2022-08-11 NOTE — Plan of Care (Signed)
  Problem: Education: Goal: Knowledge of General Education information will improve Description: Including pain rating scale, medication(s)/side effects and non-pharmacologic comfort measures Outcome: Progressing   Problem: Health Behavior/Discharge Planning: Goal: Ability to manage health-related needs will improve Outcome: Progressing   Problem: Clinical Measurements: Goal: Ability to maintain clinical measurements within normal limits will improve Outcome: Progressing Goal: Will remain free from infection Outcome: Progressing Goal: Diagnostic test results will improve Outcome: Progressing Goal: Respiratory complications will improve Outcome: Progressing Goal: Cardiovascular complication will be avoided Outcome: Progressing   Problem: Activity: Goal: Risk for activity intolerance will decrease Outcome: Progressing   Problem: Nutrition: Goal: Adequate nutrition will be maintained Outcome: Progressing   Problem: Coping: Goal: Level of anxiety will decrease Outcome: Progressing   Problem: Elimination: Goal: Will not experience complications related to bowel motility Outcome: Progressing Goal: Will not experience complications related to urinary retention Outcome: Progressing   Problem: Pain Managment: Goal: General experience of comfort will improve Outcome: Progressing   Problem: Safety: Goal: Ability to remain free from injury will improve Outcome: Progressing   Problem: Skin Integrity: Goal: Risk for impaired skin integrity will decrease Outcome: Progressing   Problem: Activity: Goal: Will verbalize the importance of balancing activity with adequate rest periods Outcome: Progressing   Problem: Education: Goal: Will be free of psychotic symptoms Outcome: Progressing Goal: Knowledge of the prescribed therapeutic regimen will improve Outcome: Progressing   Problem: Coping: Goal: Coping ability will improve Outcome: Progressing Goal: Will verbalize  feelings Outcome: Progressing   Problem: Health Behavior/Discharge Planning: Goal: Compliance with prescribed medication regimen will improve Outcome: Progressing   Problem: Nutritional: Goal: Ability to achieve adequate nutritional intake will improve Outcome: Progressing   Problem: Role Relationship: Goal: Ability to communicate needs accurately will improve Outcome: Progressing Goal: Ability to interact with others will improve Outcome: Progressing   Problem: Safety: Goal: Ability to redirect hostility and anger into socially appropriate behaviors will improve Outcome: Progressing Goal: Ability to remain free from injury will improve Outcome: Progressing   Problem: Self-Care: Goal: Ability to participate in self-care as condition permits will improve Outcome: Progressing   Problem: Self-Concept: Goal: Will verbalize positive feelings about self Outcome: Progressing   Problem: Education: Goal: Knowledge of [place] General Education information/materials will improve Outcome: Progressing Goal: Emotional status will improve Outcome: Progressing Goal: Mental status will improve Outcome: Progressing Goal: Verbalization of understanding the information provided will improve Outcome: Progressing   Problem: Activity: Goal: Interest or engagement in activities will improve Outcome: Progressing Goal: Sleeping patterns will improve Outcome: Progressing   Problem: Coping: Goal: Ability to verbalize frustrations and anger appropriately will improve Outcome: Progressing Goal: Ability to demonstrate self-control will improve Outcome: Progressing   Problem: Health Behavior/Discharge Planning: Goal: Identification of resources available to assist in meeting health care needs will improve Outcome: Progressing Goal: Compliance with treatment plan for underlying cause of condition will improve Outcome: Progressing   Problem: Physical Regulation: Goal: Ability to  maintain clinical measurements within normal limits will improve Outcome: Progressing   Problem: Safety: Goal: Periods of time without injury will increase Outcome: Progressing

## 2022-08-11 NOTE — Progress Notes (Addendum)
  [facility] Adult Case Management Discharge Plan :  Will you be returning to the same living situation after discharge:  Yes,  Patient to discharge to place of residence with partner.  At discharge, do you have transportation home?: Yes,  Patient's partner to provide transportation from hospital.  Do you have the ability to pay for your medications: Yes,  BC[employer] Medicare   Release of information consent forms completed and in the chart;  Patient's signature needed at discharge.  Patient to Follow up at:  Follow-up Information     [facility], [place]. Schedule an appointment as soon as possible for a visit.   Why: Please make an appointment with your existing outpatient provider, medical records will be sent to your psychiatrist. Contact information: [place] [address] [phone]                CSW unable to reach clinic for scheduling.   Next level of care provider has access to [place] and Suicide Prevention discussed: Yes,  SPE completed with patient, declined consent for CSW to reach family/friend.    Has patient been referred to the Quitline?: Patient refused referral Tobacco Use: High Risk (08/01/2022)   Patient History    Smoking Tobacco Use: Every Day    Smokeless Tobacco Use: Never    Passive Exposure: Not on file   Patient has been referred for addiction treatment: Pt. refused referral Social History   Substance and Sexual Activity  Drug Use Yes   Types: Marijuana   Comment: reports daily cannabis use   Social History   Substance and Sexual Activity  Alcohol Use No   Durenda Hurt, LCSWA 08/11/2022, 9:15 AM

## 2022-08-11 NOTE — Plan of Care (Signed)
Problem: Education: Goal: Knowledge of General Education information will improve Description: Including pain rating scale, medication(s)/side effects and non-pharmacologic comfort measures 08/11/2022 1257 by Earvin Hansen, RN Outcome: Adequate for Discharge 08/11/2022 1009 by Earvin Hansen, RN Outcome: Progressing   Problem: Health Behavior/Discharge Planning: Goal: Ability to manage health-related needs will improve 08/11/2022 1257 by Earvin Hansen, RN Outcome: Adequate for Discharge 08/11/2022 1009 by Earvin Hansen, RN Outcome: Progressing   Problem: Clinical Measurements: Goal: Ability to maintain clinical measurements within normal limits will improve 08/11/2022 1257 by Earvin Hansen, RN Outcome: Adequate for Discharge 08/11/2022 1009 by Earvin Hansen, RN Outcome: Progressing Goal: Will remain free from infection 08/11/2022 1257 by Earvin Hansen, RN Outcome: Adequate for Discharge 08/11/2022 1009 by Earvin Hansen, RN Outcome: Progressing Goal: Diagnostic test results will improve 08/11/2022 1257 by Earvin Hansen, RN Outcome: Adequate for Discharge 08/11/2022 1009 by Earvin Hansen, RN Outcome: Progressing Goal: Respiratory complications will improve 08/11/2022 1257 by Earvin Hansen, RN Outcome: Adequate for Discharge 08/11/2022 1009 by Earvin Hansen, RN Outcome: Progressing Goal: Cardiovascular complication will be avoided 08/11/2022 1257 by Earvin Hansen, RN Outcome: Adequate for Discharge 08/11/2022 1009 by Earvin Hansen, RN Outcome: Progressing   Problem: Activity: Goal: Risk for activity intolerance will decrease 08/11/2022 1257 by Earvin Hansen, RN Outcome: Adequate for Discharge 08/11/2022 1009 by Earvin Hansen, RN Outcome: Progressing   Problem: Nutrition: Goal: Adequate nutrition will be maintained 08/11/2022 1257 by Earvin Hansen, RN Outcome: Adequate for Discharge 08/11/2022 1009 by Earvin Hansen, RN Outcome: Progressing    Problem: Coping: Goal: Level of anxiety will decrease 08/11/2022 1257 by Earvin Hansen, RN Outcome: Adequate for Discharge 08/11/2022 1009 by Earvin Hansen, RN Outcome: Progressing   Problem: Elimination: Goal: Will not experience complications related to bowel motility 08/11/2022 1257 by Earvin Hansen, RN Outcome: Adequate for Discharge 08/11/2022 1009 by Earvin Hansen, RN Outcome: Progressing Goal: Will not experience complications related to urinary retention 08/11/2022 1257 by Earvin Hansen, RN Outcome: Adequate for Discharge 08/11/2022 1009 by Earvin Hansen, RN Outcome: Progressing   Problem: Pain Managment: Goal: General experience of comfort will improve 08/11/2022 1257 by Earvin Hansen, RN Outcome: Adequate for Discharge 08/11/2022 1009 by Earvin Hansen, RN Outcome: Progressing   Problem: Safety: Goal: Ability to remain free from injury will improve 08/11/2022 1257 by Earvin Hansen, RN Outcome: Adequate for Discharge 08/11/2022 1009 by Earvin Hansen, RN Outcome: Progressing   Problem: Skin Integrity: Goal: Risk for impaired skin integrity will decrease 08/11/2022 1257 by Earvin Hansen, RN Outcome: Adequate for Discharge 08/11/2022 1009 by Earvin Hansen, RN Outcome: Progressing   Problem: Activity: Goal: Will verbalize the importance of balancing activity with adequate rest periods 08/11/2022 1257 by Earvin Hansen, RN Outcome: Adequate for Discharge 08/11/2022 1009 by Earvin Hansen, RN Outcome: Progressing   Problem: Education: Goal: Will be free of psychotic symptoms 08/11/2022 1257 by Earvin Hansen, RN Outcome: Adequate for Discharge 08/11/2022 1009 by Earvin Hansen, RN Outcome: Progressing Goal: Knowledge of the prescribed therapeutic regimen will improve 08/11/2022 1257 by Earvin Hansen, RN Outcome: Adequate for Discharge 08/11/2022 1009 by Earvin Hansen, RN Outcome: Progressing   Problem: Coping: Goal: Coping ability will  improve 08/11/2022 1257 by Earvin Hansen, RN Outcome: Adequate for Discharge 08/11/2022 1009 by Earvin Hansen, RN Outcome: Progressing Goal: Will verbalize feelings 08/11/2022 1257 by Gaspar Bidding,  Beckie Busing, RN Outcome: Adequate for Discharge 08/11/2022 1009 by Earvin Hansen, RN Outcome: Progressing   Problem: Health Behavior/Discharge Planning: Goal: Compliance with prescribed medication regimen will improve 08/11/2022 1257 by Earvin Hansen, RN Outcome: Adequate for Discharge 08/11/2022 1009 by Earvin Hansen, RN Outcome: Progressing   Problem: Nutritional: Goal: Ability to achieve adequate nutritional intake will improve 08/11/2022 1257 by Earvin Hansen, RN Outcome: Adequate for Discharge 08/11/2022 1009 by Earvin Hansen, RN Outcome: Progressing   Problem: Role Relationship: Goal: Ability to communicate needs accurately will improve 08/11/2022 1257 by Earvin Hansen, RN Outcome: Adequate for Discharge 08/11/2022 1009 by Earvin Hansen, RN Outcome: Progressing Goal: Ability to interact with others will improve 08/11/2022 1257 by Earvin Hansen, RN Outcome: Adequate for Discharge 08/11/2022 1009 by Earvin Hansen, RN Outcome: Progressing   Problem: Safety: Goal: Ability to redirect hostility and anger into socially appropriate behaviors will improve 08/11/2022 1257 by Earvin Hansen, RN Outcome: Adequate for Discharge 08/11/2022 1009 by Earvin Hansen, RN Outcome: Progressing Goal: Ability to remain free from injury will improve 08/11/2022 1257 by Earvin Hansen, RN Outcome: Adequate for Discharge 08/11/2022 1009 by Earvin Hansen, RN Outcome: Progressing   Problem: Self-Care: Goal: Ability to participate in self-care as condition permits will improve 08/11/2022 1257 by Earvin Hansen, RN Outcome: Adequate for Discharge 08/11/2022 1009 by Earvin Hansen, RN Outcome: Progressing   Problem: Self-Concept: Goal: Will verbalize positive feelings about self 08/11/2022  1257 by Earvin Hansen, RN Outcome: Adequate for Discharge 08/11/2022 1009 by Earvin Hansen, RN Outcome: Progressing   Problem: Education: Goal: Knowledge of [place] Education information/materials will improve 08/11/2022 1257 by Earvin Hansen, RN Outcome: Adequate for Discharge 08/11/2022 1009 by Earvin Hansen, RN Outcome: Progressing Goal: Emotional status will improve 08/11/2022 1257 by Earvin Hansen, RN Outcome: Adequate for Discharge 08/11/2022 1009 by Earvin Hansen, RN Outcome: Progressing Goal: Mental status will improve 08/11/2022 1257 by Earvin Hansen, RN Outcome: Adequate for Discharge 08/11/2022 1009 by Earvin Hansen, RN Outcome: Progressing Goal: Verbalization of understanding the information provided will improve 08/11/2022 1257 by Earvin Hansen, RN Outcome: Adequate for Discharge 08/11/2022 1009 by Earvin Hansen, RN Outcome: Progressing   Problem: Activity: Goal: Interest or engagement in activities will improve 08/11/2022 1257 by Earvin Hansen, RN Outcome: Adequate for Discharge 08/11/2022 1009 by Earvin Hansen, RN Outcome: Progressing Goal: Sleeping patterns will improve 08/11/2022 1257 by Earvin Hansen, RN Outcome: Adequate for Discharge 08/11/2022 1009 by Earvin Hansen, RN Outcome: Progressing   Problem: Coping: Goal: Ability to verbalize frustrations and anger appropriately will improve 08/11/2022 1257 by Earvin Hansen, RN Outcome: Adequate for Discharge 08/11/2022 1009 by Earvin Hansen, RN Outcome: Progressing Goal: Ability to demonstrate self-control will improve 08/11/2022 1257 by Earvin Hansen, RN Outcome: Adequate for Discharge 08/11/2022 1009 by Earvin Hansen, RN Outcome: Progressing   Problem: Health Behavior/Discharge Planning: Goal: Identification of resources available to assist in meeting health care needs will improve 08/11/2022 1257 by Earvin Hansen, RN Outcome: Adequate for Discharge 08/11/2022 1009  by Earvin Hansen, RN Outcome: Progressing Goal: Compliance with treatment plan for underlying cause of condition will improve 08/11/2022 1257 by Earvin Hansen, RN Outcome: Adequate for Discharge 08/11/2022 1009 by Earvin Hansen, RN Outcome: Progressing   Problem: Physical Regulation: Goal: Ability to maintain clinical measurements within normal limits will improve 08/11/2022 1257 by  Earvin Hansen, RN Outcome: Adequate for Discharge 08/11/2022 1009 by Earvin Hansen, RN Outcome: Progressing   Problem: Safety: Goal: Periods of time without injury will increase 08/11/2022 1257 by Earvin Hansen, RN Outcome: Adequate for Discharge 08/11/2022 1009 by Earvin Hansen, RN Outcome: Progressing

## 2022-12-20 ENCOUNTER — Emergency Department
Admission: EM | Admit: 2022-12-20 | Discharge: 2022-12-23 | Disposition: A | Discharge status: Other Acute Inpatient Hospital | Payer: Medicare HMO | Attending: Emergency Medicine | Admitting: Emergency Medicine

## 2022-12-20 ENCOUNTER — Other Ambulatory Visit: Payer: Self-pay

## 2022-12-20 DIAGNOSIS — M25511 Pain in right shoulder: Secondary | ICD-10-CM | POA: Insufficient documentation

## 2022-12-20 DIAGNOSIS — F259 Schizoaffective disorder, unspecified: Secondary | ICD-10-CM | POA: Insufficient documentation

## 2022-12-20 DIAGNOSIS — Z1152 Encounter for screening for COVID-19: Secondary | ICD-10-CM | POA: Insufficient documentation

## 2022-12-20 DIAGNOSIS — Z79899 Other long term (current) drug therapy: Secondary | ICD-10-CM | POA: Diagnosis not present

## 2022-12-20 DIAGNOSIS — G8929 Other chronic pain: Secondary | ICD-10-CM | POA: Insufficient documentation

## 2022-12-20 DIAGNOSIS — F29 Unspecified psychosis not due to a substance or known physiological condition: Secondary | ICD-10-CM | POA: Diagnosis not present

## 2022-12-20 DIAGNOSIS — Z20822 Contact with and (suspected) exposure to covid-19: Secondary | ICD-10-CM | POA: Insufficient documentation

## 2022-12-20 DIAGNOSIS — R451 Restlessness and agitation: Secondary | ICD-10-CM | POA: Diagnosis present

## 2022-12-20 LAB — SALICYLATE LEVEL: Salicylate Lvl: <7 mg/dL — ABNORMAL LOW (ref 7.0–30.0)

## 2022-12-20 LAB — CBC
HCT: 44 % (ref 36.0–46.0)
Hemoglobin: 14.2 g/dL (ref 12.0–15.0)
MCH: 29.8 pg (ref 26.0–34.0)
MCHC: 32.3 g/dL (ref 30.0–36.0)
MCV: 92.4 fL (ref 80.0–100.0)
Platelets: 346 10*3/uL (ref 150–400)
RBC: 4.76 MIL/uL (ref 3.87–5.11)
RDW: 13.3 % (ref 11.5–15.5)
WBC: 10.1 10*3/uL (ref 4.0–10.5)
nRBC: 0 % (ref 0.0–0.2)

## 2022-12-20 LAB — COMPREHENSIVE METABOLIC PANEL
ALT: 21 U/L (ref 0–44)
AST: 22 U/L (ref 15–41)
Albumin: 4.4 g/dL (ref 3.5–5.0)
Alkaline Phosphatase: 86 U/L (ref 38–126)
Anion gap: 12 (ref 5–15)
BUN: 9 mg/dL (ref 6–20)
CO2: 22 mmol/L (ref 22–32)
Calcium: 9.2 mg/dL (ref 8.9–10.3)
Chloride: 102 mmol/L (ref 98–111)
Creatinine, Ser: 0.82 mg/dL (ref 0.44–1.00)
GFR, Estimated: >60 mL/min (ref >60)
Glucose, Bld: 110 mg/dL — ABNORMAL HIGH (ref 70–99)
Potassium: 3.3 mmol/L — ABNORMAL LOW (ref 3.5–5.1)
Sodium: 136 mmol/L (ref 135–145)
Total Bilirubin: 0.9 mg/dL (ref 0.3–1.2)
Total Protein: 8 g/dL (ref 6.5–8.1)

## 2022-12-20 LAB — ETHANOL: Alcohol, Ethyl (B): <10 mg/dL (ref <10)

## 2022-12-20 LAB — ACETAMINOPHEN LEVEL: Acetaminophen (Tylenol), Serum: <10 ug/mL — ABNORMAL LOW (ref 10–30)

## 2022-12-20 MED ORDER — METHOCARBAMOL 500 MG PO TABS
500.0000 mg | ORAL_TABLET | Freq: Three times a day (TID) | ORAL | Status: DC
  Administered 2022-12-20 – 2022-12-23 (×7): 500 mg via ORAL
  Filled 2022-12-20 (×7): qty 1

## 2022-12-20 MED ORDER — CLONAZEPAM 1 MG PO TABS
1.0000 mg | ORAL_TABLET | Freq: Four times a day (QID) | ORAL | Status: DC
  Administered 2022-12-20 – 2022-12-23 (×9): 1 mg via ORAL
  Filled 2022-12-20: qty 1
  Filled 2022-12-20: qty 2
  Filled 2022-12-20 (×6): qty 1
  Filled 2022-12-20: qty 2

## 2022-12-20 MED ORDER — PAROXETINE HCL 20 MG PO TABS
20.0000 mg | ORAL_TABLET | Freq: Every day | ORAL | Status: DC
  Administered 2022-12-20 – 2022-12-23 (×4): 20 mg via ORAL
  Filled 2022-12-20 (×4): qty 1

## 2022-12-20 MED ORDER — HYDROXYZINE HCL 25 MG PO TABS
25.0000 mg | ORAL_TABLET | Freq: Three times a day (TID) | ORAL | Status: DC | PRN
  Administered 2022-12-20 – 2022-12-22 (×2): 25 mg via ORAL
  Filled 2022-12-20 (×2): qty 1

## 2022-12-20 MED ORDER — NICOTINE 21 MG/24HR TD PT24
21.0000 mg | MEDICATED_PATCH | Freq: Every day | TRANSDERMAL | Status: DC
  Administered 2022-12-20 – 2022-12-23 (×4): 21 mg via TRANSDERMAL
  Filled 2022-12-20 (×4): qty 1

## 2022-12-20 MED ORDER — LACOSAMIDE 50 MG PO TABS
50.0000 mg | ORAL_TABLET | Freq: Two times a day (BID) | ORAL | Status: DC
  Administered 2022-12-20 – 2022-12-23 (×6): 50 mg via ORAL
  Filled 2022-12-20 (×6): qty 1

## 2022-12-20 MED ORDER — ZIPRASIDONE MESYLATE 20 MG IM SOLR: 20.0000 mg | Freq: Once | INTRAMUSCULAR | Status: DC

## 2022-12-20 MED ORDER — LORAZEPAM 2 MG PO TABS
2.0000 mg | ORAL_TABLET | Freq: Once | ORAL | Status: AC
  Administered 2022-12-20: 2 mg via ORAL
  Filled 2022-12-20: qty 1

## 2022-12-20 MED ORDER — QUETIAPINE FUMARATE 200 MG PO TABS
400.0000 mg | ORAL_TABLET | Freq: Every day | ORAL | Status: DC
  Administered 2022-12-20 – 2022-12-22 (×3): 400 mg via ORAL
  Filled 2022-12-20 (×3): qty 2

## 2022-12-20 NOTE — ED Provider Notes (Signed)
   [facility] Provider Note    Event Date/Time   First MD Initiated Contact with Patient 12/20/22 1842     (approximate)  History   Chief Complaint: Psychiatric Evaluation  HPI  Kellsie Grindle is a 42 y.o. female with a past medical history of schizoaffective, presents to the emergency department under IVC for agitation/psychosis.  According to the IVC the patient went to the dental clinic and [place] was acting irrational and agitated and police were called and brought the patient to our [facility].  Patient continued to act erratic and agitated and was brought to the emergency department.  Here upon arrival patient very agitated initially trying to fight back with staff members handcuffed by police.  During my evaluation patient continues to state that she did not do anything does not know why she is here.  However at times she seems delusional making statements such as she is dating and going to marry Eminem the rapper.  Patient denies any drug use.  Does not appear safe to care for herself in her current state.  Physical Exam   Triage Vital Signs: ED Triage Vitals  Enc Vitals Group     BP      Pulse      Resp      Temp      Temp src      SpO2      Weight      Height      Head Circumference      Peak Flow      Pain Score      Pain Loc      Pain Edu?      Excl. in GC?     Most recent vital signs: There were no vitals filed for this visit.  General: Awake, no distress.  Somewhat agitated.  Currently handcuffed by police. CV:  Good peripheral perfusion.  Regular rate and rhythm  Resp:  Normal effort.  Equal breath sounds bilaterally.  Abd:  No distention.  Soft, nontender.  No rebound or guarding. Other:  States chronic right shoulder pain, no deformity or concern for dislocation or fracture.  ED Results / Procedures / Treatments   MEDICATIONS ORDERED IN ED: Medications - No data to display   IMPRESSION / MDM / ASSESSMENT AND PLAN / ED  COURSE  I reviewed the triage vital signs and the nursing notes.  Patient's presentation is most consistent with acute presentation with potential threat to life or bodily function.  Patient presents emergency department under IVC for agitation and possible psychosis.  History of schizoaffective.  Denies any drug use.  We will check labs including urine drug screen.  Will continue the IVC until psychiatry can adequately evaluate.  We will dose 20 mg of Geodon for agitation.  Patient is asking willingly for a shot of medication.  CBC is reassuring chemistry reassuring, ethanol is negative.  Salicylate and acetaminophen negative.  I have reordered the patient's home medications.  Psychiatry evaluation pending.  FINAL CLINICAL IMPRESSION(S) / ED DIAGNOSES   Psychosis Agitation    Note:  This document was prepared using Dragon voice recognition software and may include unintentional dictation errors.   Minna Antis, MD 12/20/22 [phone]
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
## 2022-12-20 NOTE — ED Triage Notes (Signed)
Pt to er room number 21 with pd, pt is cuffed and yelling, md at bedside, pt de escalated pt is ivc for danger to self and decompensating mental health.  Pt states that she has a 65 iq and god has told her that she will save [place] and she will be the first woman president.  Pt moving all extremities, pt reports some shoulder pain.
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
## 2022-12-20 NOTE — ED Notes (Signed)
Patient wandering outside of room to hallway. Redirected to room. Patient is now in room talking to herself stating that she is a queen and should be treating as such and that she was the chosen one to make [place] great again.

## 2022-12-20 NOTE — ED Notes (Signed)
IVC 

## 2022-12-21 DIAGNOSIS — F259 Schizoaffective disorder, unspecified: Secondary | ICD-10-CM | POA: Diagnosis not present

## 2022-12-21 LAB — URINE DRUG SCREEN, QUALITATIVE (ARMC ONLY)
Amphetamines, Ur Screen: POSITIVE — AB
Barbiturates, Ur Screen: NOT DETECTED
Benzodiazepine, Ur Scrn: POSITIVE — AB
Cannabinoid 50 Ng, Ur ~~LOC~~: POSITIVE — AB
Cocaine Metabolite,Ur ~~LOC~~: NOT DETECTED
MDMA (Ecstasy)Ur Screen: NOT DETECTED
Methadone Scn, Ur: POSITIVE — AB
Opiate, Ur Screen: NOT DETECTED
Phencyclidine (PCP) Ur S: NOT DETECTED
Tricyclic, Ur Screen: POSITIVE — AB

## 2022-12-21 MED ORDER — IBUPROFEN 600 MG PO TABS
600.0000 mg | ORAL_TABLET | Freq: Once | ORAL | Status: AC
  Administered 2022-12-21: 600 mg via ORAL
  Filled 2022-12-21: qty 1

## 2022-12-21 MED ORDER — ACETAMINOPHEN 500 MG PO TABS
1000.0000 mg | ORAL_TABLET | Freq: Once | ORAL | Status: AC
  Administered 2022-12-21: 1000 mg via ORAL
  Filled 2022-12-21: qty 2

## 2022-12-21 MED ORDER — OLANZAPINE 10 MG PO TBDP
10.0000 mg | ORAL_TABLET | Freq: Three times a day (TID) | ORAL | Status: DC | PRN
  Administered 2022-12-21: 10 mg via ORAL
  Filled 2022-12-21: qty 1

## 2022-12-21 MED ORDER — LORAZEPAM 1 MG PO TABS: 1.0000 mg | ORAL_TABLET | ORAL | Status: DC | PRN

## 2022-12-21 MED ORDER — ZIPRASIDONE MESYLATE 20 MG IM SOLR: 20.0000 mg | INTRAMUSCULAR | Status: DC | PRN
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
## 2022-12-21 NOTE — ED Notes (Signed)
IVC/Recommend psychiatric Inpatient admission when medically cleared.  

## 2022-12-21 NOTE — ED Notes (Signed)
Pt is agitated, stating that they are experiencing pain in the neck and back. Pt is yelling out that they need a doctor and they need xrays because they can't move their head.
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
## 2022-12-21 NOTE — ED Notes (Signed)
PM snack provided. 
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
## 2022-12-21 NOTE — ED Notes (Signed)
Pts snack and drink at bedside; pt currently sleeping. 

## 2022-12-21 NOTE — BH Assessment (Addendum)
Per [facility] Alcario Drought), patient to be referred out of system.  Referral information for Psychiatric Hospitalization faxed to;   [facility] [phone]) No available beds  [facility] [phone]- [phone]),   [facility] [phone]), Denied due to no appropriate bed  [facility] [phone]),   [facility] [phone] -or- [phone]),   [facility] [phone])  [facility] [place] [facility] [phone])

## 2022-12-21 NOTE — ED Notes (Signed)
IVC/pt recommend for in-pt psych

## 2022-12-21 NOTE — ED Notes (Signed)
Pt. Alert and oriented, warm and dry, in no distress. Pt. Denies SI, HI, and AVH. Patient continues to complain of pain. EDP notified and PO medication given per orders.  Patient states she has got to get out of here she is getting her new dentures week after next. Pt. Encouraged to let nursing staff know of any concerns or needs.   ENVIRONMENTAL ASSESSMENT Potentially harmful objects out of patient reach: Yes.   Personal belongings secured: Yes.   Patient dressed in hospital provided attire only: Yes.   Plastic bags out of patient reach: Yes.   Patient care equipment (cords, cables, call bells, lines, and drains) shortened, removed, or accounted for: Yes.   Equipment and supplies removed from bottom of stretcher: Yes.   Potentially toxic materials out of patient reach: Yes.   Sharps container removed or out of patient reach: Yes.

## 2022-12-21 NOTE — ED Notes (Addendum)
Pts dinner tray and drink at bedside, pt currently sleeping. 

## 2022-12-22 DIAGNOSIS — F259 Schizoaffective disorder, unspecified: Secondary | ICD-10-CM | POA: Diagnosis not present

## 2022-12-22 MED ORDER — ACETAMINOPHEN 325 MG PO TABS
650.0000 mg | ORAL_TABLET | Freq: Once | ORAL | Status: AC
  Administered 2022-12-22: 650 mg via ORAL
  Filled 2022-12-22: qty 2

## 2022-12-22 NOTE — ED Notes (Signed)
Patient given snack at this time

## 2022-12-22 NOTE — ED Notes (Signed)
Pt stated that "my back is really hurting, can I have some tylenol." This RN told pt that we would let the MD know.

## 2022-12-22 NOTE — Consult Note (Signed)
  Patient noted lying in bed. Patient asleep upon writer entering her pod. She turned over and returned to sleep after Clinical research associate greeted her.

## 2022-12-22 NOTE — ED Notes (Signed)
Peaches and water provided for snack

## 2022-12-22 NOTE — ED Notes (Signed)
Ivc /recommend inpatient psych admission

## 2022-12-22 NOTE — ED Notes (Signed)
Lunch tray and water provided  

## 2022-12-22 NOTE — ED Notes (Signed)
Pt given 1 toothbrush and toothpaste, 1 bar of soap, 1 body wash, 1 deodorant, 1 pair of disposable briefs, 1 pair of scrubs pants and top, and 2 towels. Pt  returned all items, but hair was dry/unwashed, and toothbrush was not used. Pt complained that the water turned off during her shower and when asked if she would like to try again pt stated that "[name] really wants to take a shower, so no." When asked if pt would like to shower after the other pt shower's pt stated "it's too cold." Pt then returned to her bed.

## 2022-12-22 NOTE — ED Notes (Signed)
Unable to obtain vitals due to patient sleeping. Will continue to monitor.   

## 2022-12-22 NOTE — ED Notes (Signed)
Patient trash disposed of at this time.

## 2022-12-22 NOTE — ED Notes (Signed)
Pt complained of not being able to sleep and was questioning her night medications stating nurses did not give her all of them. Pt educated on medications that she received and she then stated she needs the dosages increased. Pt educated on med management and admin, prn offered, given.

## 2022-12-22 NOTE — ED Notes (Signed)
Pt came to door asking for a towel to clean up spilled water in their room that was noticed on the last med pass. Pt cleaned up the mess and brought the towel back to nurse's station to be put in the hamper. Pt then asked about her afternoon meds and was told that they were due at 2pm and that we would go ahead and get them for the pt, and the pt was very appreciative. During conversation pt mentioned that "God told me that they are coming." When asked who was coming, pt stated "[place]." While pt was at [employer], pt stated that "I can help you." When asked what they could help me with, pt stated "pass out the snacks" and the pt was thanked and told that between everyone we have working back here today, we have it covered. Pt then took her meds and went back to their room.

## 2022-12-22 NOTE — ED Notes (Signed)
Pt given house phone to make a call.

## 2022-12-22 NOTE — ED Notes (Signed)
Pt provided snack and water 

## 2022-12-22 NOTE — ED Notes (Signed)
Dinner tray given to patient at this time.  

## 2022-12-22 NOTE — ED Notes (Signed)
Breakfast tray and juice provided 

## 2022-12-23 DIAGNOSIS — F259 Schizoaffective disorder, unspecified: Secondary | ICD-10-CM | POA: Diagnosis not present

## 2022-12-23 LAB — RESP PANEL BY RT-PCR (RSV, FLU A&B, COVID)  RVPGX2
Influenza A by PCR: NEGATIVE
Influenza B by PCR: NEGATIVE
Resp Syncytial Virus by PCR: NEGATIVE
SARS Coronavirus 2 by RT PCR: NEGATIVE

## 2022-12-23 MED ORDER — IBUPROFEN 600 MG PO TABS
600.0000 mg | ORAL_TABLET | Freq: Once | ORAL | Status: AC
  Administered 2022-12-23: 600 mg via ORAL
  Filled 2022-12-23: qty 1

## 2022-12-23 NOTE — ED Notes (Signed)
[place]  [facility]  called  for  transport to  [facility] 

## 2022-12-23 NOTE — ED Notes (Signed)
Report given to  Sarah, RN.

## 2022-12-23 NOTE — BH Assessment (Addendum)
PATIENT BED AVAILABLE AFTER 9AM ON 12/23/22  Patient has been accepted to [facility].  Patient assigned to [facility] A-Unit Accepting physician is Dr. Sallyanne Kuster.  Call report to [phone].  Representative was [employer].   ER Staff is aware of it:  [facility] ER Secretary  Dr. Katrinka Blazing, ER MD  Thayer Ohm Patient's Nurse       Address: [phone]  Facility is requesting a COVID test be completed and faxed before transfer

## 2022-12-23 NOTE — ED Notes (Signed)
Pt given her breakfast tray.  
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
## 2022-12-23 NOTE — ED Notes (Signed)
Attempted to call report, no answer just phone ringing

## 2022-12-23 NOTE — BH Assessment (Signed)
Referral information for Psychiatric Hospitalization REfaxed to;    [facility] [phone]- [phone]) No available beds   [facility] [phone]),    [facility] [phone]), Denied due to no appropriate bed   [facility] [phone]),    [facility] [phone] -or- [phone]),    [facility] [phone])   [facility] [phone])

## 2022-12-23 NOTE — ED Notes (Signed)
Admission discussed with pt and she was tearful saying she didn't want to go. IVC explained to pt but pt remains delusional saying she didn't do anything and wants to remain here this hospital. Consented to vitals and meds. Belongings given to Emergency planning/management officer to transport with pt and pt went with officer. Stable, ambulatory and in NAD.

## 2022-12-23 NOTE — ED Provider Notes (Signed)
Emergency Medicine Observation Re-evaluation Note  [name] is a 42 y.o. female, seen on rounds today.  Pt initially presented to the ED for complaints of Psychiatric Evaluation  Currently, the patient is resting in bed. No reported issues overnight from nursing team.   Physical Exam  BP 116/73   Pulse 83   Temp 98 F (36.7 C)   Resp 17   Ht 5\' 3"  (1.6 m)   Wt 72.6 kg   SpO2 98%   BMI 28.34 kg/m  Physical Exam General: Resting comfortably in bed  ED Course / MDM  EKG:EKG Interpretation Date/Time:  Wednesday December 21 2022 16:00:30 EDT Ventricular Rate:  93 PR Interval:  154 QRS Duration:  76 QT Interval:  414 QTC Calculation: 514 R Axis:   108  Text Interpretation: Normal sinus rhythm Rightward axis T wave abnormality, consider anterior ischemia Prolonged QT Abnormal ECG When compared with ECG of 31-Jul-2022 23:32, Minimal criteria for Inferior infarct are no longer Present Nonspecific T wave abnormality, improved in Inferior leads Confirmed by UNCONFIRMED, DOCTOR ([id number]), editor Lonell Face [phone]) on 12/22/2022 7:12:13 AM  I have reviewed the labs and medications over the past 24 hours.   Plan  Current plan is for inpatient psych placement.    Trinna Post, MD 12/23/22 [phone]

## 2023-01-22 ENCOUNTER — Emergency Department
Admission: EM | Admit: 2023-01-22 | Discharge: 2023-01-25 | Disposition: A | Discharge status: Other Acute Inpatient Hospital | Payer: Medicare HMO | Attending: Emergency Medicine | Admitting: Emergency Medicine

## 2023-01-22 ENCOUNTER — Other Ambulatory Visit: Payer: Self-pay

## 2023-01-22 DIAGNOSIS — Z79899 Other long term (current) drug therapy: Secondary | ICD-10-CM | POA: Diagnosis not present

## 2023-01-22 DIAGNOSIS — F259 Schizoaffective disorder, unspecified: Secondary | ICD-10-CM | POA: Diagnosis not present

## 2023-01-22 DIAGNOSIS — F17210 Nicotine dependence, cigarettes, uncomplicated: Secondary | ICD-10-CM | POA: Diagnosis not present

## 2023-01-22 DIAGNOSIS — F1110 Opioid abuse, uncomplicated: Secondary | ICD-10-CM | POA: Diagnosis present

## 2023-01-22 DIAGNOSIS — R456 Violent behavior: Secondary | ICD-10-CM | POA: Diagnosis present

## 2023-01-22 DIAGNOSIS — E876 Hypokalemia: Secondary | ICD-10-CM | POA: Insufficient documentation

## 2023-01-22 DIAGNOSIS — F22 Delusional disorders: Secondary | ICD-10-CM | POA: Diagnosis not present

## 2023-01-22 DIAGNOSIS — F23 Brief psychotic disorder: Secondary | ICD-10-CM | POA: Insufficient documentation

## 2023-01-22 LAB — COMPREHENSIVE METABOLIC PANEL
ALT: 11 U/L (ref 0–44)
AST: 13 U/L — ABNORMAL LOW (ref 15–41)
Albumin: 4.4 g/dL (ref 3.5–5.0)
Alkaline Phosphatase: 77 U/L (ref 38–126)
Anion gap: 12 (ref 5–15)
BUN: <5 mg/dL — ABNORMAL LOW (ref 6–20)
CO2: 24 mmol/L (ref 22–32)
Calcium: 9.1 mg/dL (ref 8.9–10.3)
Chloride: 102 mmol/L (ref 98–111)
Creatinine, Ser: 0.69 mg/dL (ref 0.44–1.00)
GFR, Estimated: >60 mL/min (ref >60)
Glucose, Bld: 118 mg/dL — ABNORMAL HIGH (ref 70–99)
Potassium: 2.7 mmol/L — CL (ref 3.5–5.1)
Sodium: 138 mmol/L (ref 135–145)
Total Bilirubin: 0.7 mg/dL (ref 0.3–1.2)
Total Protein: 8 g/dL (ref 6.5–8.1)

## 2023-01-22 LAB — CBC
HCT: 41.6 % (ref 36.0–46.0)
Hemoglobin: 14.2 g/dL (ref 12.0–15.0)
MCH: 30.3 pg (ref 26.0–34.0)
MCHC: 34.1 g/dL (ref 30.0–36.0)
MCV: 88.9 fL (ref 80.0–100.0)
Platelets: 394 10*3/uL (ref 150–400)
RBC: 4.68 MIL/uL (ref 3.87–5.11)
RDW: 14.3 % (ref 11.5–15.5)
WBC: 12.6 10*3/uL — ABNORMAL HIGH (ref 4.0–10.5)
nRBC: 0 % (ref 0.0–0.2)

## 2023-01-22 LAB — ETHANOL: Alcohol, Ethyl (B): <10 mg/dL (ref <10)

## 2023-01-22 LAB — ACETAMINOPHEN LEVEL: Acetaminophen (Tylenol), Serum: <10 ug/mL — ABNORMAL LOW (ref 10–30)

## 2023-01-22 LAB — SALICYLATE LEVEL: Salicylate Lvl: <7 mg/dL — ABNORMAL LOW (ref 7.0–30.0)

## 2023-01-22 LAB — MAGNESIUM: Magnesium: 1.8 mg/dL (ref 1.7–2.4)

## 2023-01-22 MED ORDER — DIPHENHYDRAMINE HCL 50 MG/ML IJ SOLN
50.0000 mg | Freq: Once | INTRAMUSCULAR | Status: AC
  Administered 2023-01-22: 50 mg via INTRAMUSCULAR
  Filled 2023-01-22: qty 1

## 2023-01-22 MED ORDER — MIDAZOLAM HCL 2 MG/2ML IJ SOLN
2.0000 mg | Freq: Once | INTRAMUSCULAR | Status: AC
  Administered 2023-01-22: 2 mg via INTRAMUSCULAR
  Filled 2023-01-22: qty 2

## 2023-01-22 MED ORDER — MIDAZOLAM HCL 2 MG/2ML IJ SOLN: 4.0000 mg | Freq: Once | INTRAMUSCULAR | Status: DC

## 2023-01-22 MED ORDER — HYDROXYZINE HCL 25 MG PO TABS
25.0000 mg | ORAL_TABLET | Freq: Three times a day (TID) | ORAL | Status: DC | PRN
  Administered 2023-01-23 (×2): 25 mg via ORAL
  Filled 2023-01-22 (×2): qty 1

## 2023-01-22 MED ORDER — POTASSIUM CHLORIDE CRYS ER 20 MEQ PO TBCR
40.0000 meq | EXTENDED_RELEASE_TABLET | Freq: Once | ORAL | Status: DC
  Filled 2023-01-22: qty 2

## 2023-01-22 MED ORDER — PAROXETINE HCL 20 MG PO TABS
20.0000 mg | ORAL_TABLET | Freq: Every day | ORAL | Status: DC
  Administered 2023-01-23 – 2023-01-25 (×3): 20 mg via ORAL
  Filled 2023-01-22 (×3): qty 1

## 2023-01-22 MED ORDER — TRAZODONE HCL 50 MG PO TABS
50.0000 mg | ORAL_TABLET | Freq: Every evening | ORAL | Status: DC | PRN
  Administered 2023-01-23: 50 mg via ORAL
  Filled 2023-01-22: qty 1

## 2023-01-22 MED ORDER — QUETIAPINE FUMARATE 200 MG PO TABS
400.0000 mg | ORAL_TABLET | Freq: Every day | ORAL | Status: DC
  Administered 2023-01-23 – 2023-01-24 (×2): 400 mg via ORAL
  Filled 2023-01-22 (×2): qty 2

## 2023-01-22 NOTE — ED Provider Notes (Signed)
The patient has been placed in psychiatric observation due to the need to provide a safe environment for the patient while obtaining psychiatric consultation and evaluation, as well as ongoing medical and medication management to treat the patient's condition.  The patient has been placed under full IVC at this time.    Sharyn Creamer, MD 01/22/23 [phone]

## 2023-01-22 NOTE — ED Triage Notes (Addendum)
Pt to ER in police custody for psychiatric evaluation. Police were dispatched by patient's daughter who reported pt wandering around naked in the front yard. PD confirms patient was walking around in her yard naked upon their arrival. Pt has known psychiatric diagnoses. Pt is hyperfocused on getting her klonopin. Pt denies SI/HI and AV hallucinations.

## 2023-01-22 NOTE — ED Notes (Signed)
Patient spit out potassium pills. Patient appears to not fully comprehend directions at this current time.

## 2023-01-22 NOTE — ED Notes (Signed)
Critical result: Potassium 2.7  Fanny Bien, MD aware

## 2023-01-22 NOTE — ED Provider Notes (Signed)
[facility] Provider Note    Event Date/Time   First MD Initiated Contact with Patient 01/22/23 2048     (approximate)   History   Psychiatric Evaluation  EM caveat: Patient with bizarre behavior, altered mental status suspected psychosis  HPI  [name] is a 42 y.o. female with a documented history of schizoaffective disorder and bipolar  Patient brought to the ER under involuntary commitment for concerns of this bizarre behavior.  Evidently was out running about naked in her lawn, her roommate called reports she believes the patient has been abusing her psychiatric medications  Reviewed prior ED evaluation including psychiatric consultation with the patient was felt to be suffering from acute psychosis related to underlying psychiatric illness      Physical Exam   Triage Vital Signs: ED Triage Vitals  Encounter Vitals Group     BP 01/22/23 1755 96/83     Systolic BP Percentile --      Diastolic BP Percentile --      Pulse Rate 01/22/23 1755 92     Resp 01/22/23 1755 18     Temp 01/22/23 1755 98.8 F (37.1 C)     Temp Source 01/22/23 1755 Oral     SpO2 01/22/23 1755 97 %     Weight 01/22/23 1755 140 lb (63.5 kg)     Height 01/22/23 1755 5\' 1"  (1.549 m)     Head Circumference --      Peak Flow --      Pain Score 01/22/23 1811 0     Pain Loc --      Pain Education --      Exclude from Growth Chart --     Most recent vital signs: Vitals:   01/22/23 1755  BP: 96/83  Pulse: 92  Resp: 18  Temp: 98.8 F (37.1 C)  SpO2: 97%     General: Awake, no distress.  Sitting upright.  When asked she says her name is "[name]" but otherwise says "yes" to pretty much all lines of questions.  She is sitting upright, in no distress respirating normally.  Moves about the bed and follows basic commands such as please lay down, rest, take deep breaths but otherwise provides no history.  She does not exhibit acute agitation CV:  Good peripheral  perfusion.  Normal tones and rate Resp:  Normal effort.  Clear bilateral with normal work of breathing Abd:  No distention.  Soft and nontender Other:  Is all extremities without noted deficit.   ED Results / Procedures / Treatments   Labs (all labs ordered are listed, but only abnormal results are displayed) Labs Reviewed  COMPREHENSIVE METABOLIC PANEL - Abnormal; Notable for the following components:      Result Value   Potassium 2.7 (*)    Glucose, Bld 118 (*)    BUN <5 (*)    AST 13 (*)    All other components within normal limits  SALICYLATE LEVEL - Abnormal; Notable for the following components:   Salicylate Lvl <7.0 (*)    All other components within normal limits  ACETAMINOPHEN LEVEL - Abnormal; Notable for the following components:   Acetaminophen (Tylenol), Serum <10 (*)    All other components within normal limits  CBC - Abnormal; Notable for the following components:   WBC 12.6 (*)    All other components within normal limits  ETHANOL  MAGNESIUM  URINE DRUG SCREEN, QUALITATIVE ([facility] ONLY)  POC URINE PREG, ED  Labs interpreted as hypokalemia.  Mild leukocytosis  RADIOLOGY     PROCEDURES:  Critical Care performed: No  Procedures   MEDICATIONS ORDERED IN ED: Medications  potassium chloride SA (KLOR-CON M) CR tablet 40 mEq (40 mEq Oral Patient Refused/Not Given 01/22/23 2052)  hydrOXYzine (ATARAX) tablet 25 mg (has no administration in time range)  PARoxetine (PAXIL) tablet 20 mg (has no administration in time range)  traZODone (DESYREL) tablet 50 mg (has no administration in time range)  QUEtiapine (SEROQUEL) tablet 400 mg (has no administration in time range)  diphenhydrAMINE (BENADRYL) injection 50 mg (50 mg Intramuscular Given 01/22/23 2141)  midazolam (VERSED) injection 2 mg (2 mg Intramuscular Given 01/22/23 2141)     IMPRESSION / MDM / ASSESSMENT AND PLAN / ED COURSE  I reviewed the triage vital signs and the nursing notes.                               Differential diagnosis includes, but is not limited to, acute psychosis, underlying psychiatric disease, medication effect etc. will remain under IVC, psych consult has been ordered.  Patient's presentation is most consistent with exacerbation of chronic underlying disease.  Patient requiring diagnostic workup  No evidence by clinical history of acute metabolic cause for presentation is a history of similar presentation in the past reviewed by psychiatric etiology.  Does have associated hypokalemia, wish to replete, but patient currently resting after receiving calming medication treatments.  Plan to reinitiate her home medications as well as potassium once patient's somnolence improves.   Notified by psychiatry, practitioner Durwin Nora, recommendation for psychiatric inpatient admission  Ongoing ER care assigned to Dr. Rosalia Hammers       FINAL CLINICAL IMPRESSION(S) / ED DIAGNOSES   Final diagnoses:  Acute psychosis (HCC)     Rx / DC Orders   ED Discharge Orders     None        Note:  This document was prepared using Dragon voice recognition software and may include unintentional dictation errors.   Sharyn Creamer, MD 01/22/23 [phone]

## 2023-01-22 NOTE — Consult Note (Signed)
[facility] Face-to-Face Psychiatry Consult   Reason for Consult:  Psych evaluation  Referring Physician:  Dr. Fanny Bien Patient Identification: [name] MRN:  [id number] Principal Diagnosis: Psychosis [facility]) Diagnosis:  Principal Problem:   Psychosis (HCC) Active Problems:   Opiate abuse, episodic (HCC)   Schizoaffective disorder (HCC)   Paranoia (psychosis) (HCC)   Total Time spent with patient: 40 minutes  Subjective:   Patient observed by neighbors to be wondering around her yard naked and acting bizarrely.     HPI:  [name], 42 y.o., female patient seen by this provider; chart reviewed and consulted with Dr. Fanny Bien on 01/22/23.  On evaluation Nyara Border is laying in bed.  She respond to her name being called, but nothing else after that.  When asked questions, patient mumble and make nonsensical noises.  Per chart review, pt to ER in police custody for psychiatric evaluation. Police were dispatched by patient's daughter who reported pt wandering around naked in the front yard. PD confirms patient was walking around in her yard naked upon their arrival. Pt has known psychiatric diagnoses. Pt is hyperfocused on getting her klonopin. Pt denies SI/HI and AV hallucinations.  Patient is in a confused and disoriented state. Patient is unable to carry on a logical and coherent conversation at the present time.   Per chart review, patient has presented to the er 3  times for similar episodes and admitted to [facility] 1x.  She appeared to present the same on this presentation, psychotic with bizarre behavior.    Recommendation:  Inpatient hospitalization  Dr. Fanny Bien informed of above recommendation and disposition  Past Psychiatric History: Schizoaffective disorder  Risk to Self:   Risk to Others:   Prior Inpatient Therapy:   Prior Outpatient Therapy:    Past Medical History:  Past Medical History:  Diagnosis Date   Degeneration of lumbar intervertebral disc     Depression    DVT (deep vein thrombosis) in pregnancy    Schizoaffective disorder (HCC)    Seizures (HCC)    Shoulder pain, right     Past Surgical History:  Procedure Laterality Date   CESAREAN SECTION     X2   Family History:  Family History  Problem Relation Age of Onset   Cancer Mother    Family Psychiatric  History: unknown Social History:  Social History   Substance and Sexual Activity  Alcohol Use No     Social History   Substance and Sexual Activity  Drug Use Yes   Types: Marijuana   Comment: reports daily cannabis use    Social History   Socioeconomic History   Marital status: Divorced    Spouse name: Not on file   Number of children: Not on file   Years of education: Not on file   Highest education level: Not on file  Occupational History   Occupation: unemployed  Tobacco Use   Smoking status: Every Day    Current packs/day: 1.50    Types: Cigarettes   Smokeless tobacco: Never  Vaping Use   Vaping status: Never Used  Substance and Sexual Activity   Alcohol use: No   Drug use: Yes    Types: Marijuana    Comment: reports daily cannabis use   Sexual activity: Yes    Partners: Male  Other Topics Concern   Not on file  Social History Narrative   Not on file   Social Determinants of Health   Financial Resource Strain: Unknown (11/17/2018)   Overall  Financial Resource Strain (CARDIA)    Difficulty of Paying Living Expenses: Patient declined  Food Insecurity: No Food Insecurity (07/31/2022)   Hunger Vital Sign    Worried About Running Out of Food in the Last Year: Never true    Ran Out of Food in the Last Year: Never true  Transportation Needs: Unmet Transportation Needs (08/01/2022)   PRAPARE - Transportation    Lack of Transportation (Medical): No    Lack of Transportation (Non-Medical): Yes  Physical Activity: Unknown (11/17/2018)   Exercise Vital Sign    Days of Exercise per Week: Patient declined    Minutes of Exercise per Session: Patient  declined  Stress: Unknown (11/17/2018)   [employer] of Occupational Health - Occupational Stress Questionnaire    Feeling of Stress : Patient declined  Social Connections: Unknown (11/17/2018)   Social Connection and Isolation Panel [NHANES]    Frequency of Communication with Friends and Family: Patient declined    Frequency of Social Gatherings with Friends and Family: Patient declined    Attends Religious Services: Patient declined    Database administrator or Organizations: Patient declined    Attends Banker Meetings: Patient declined    Marital Status: Patient declined   Additional Social History:    Allergies:   Allergies  Allergen Reactions   Latex Rash and Swelling    Other reaction(s): Unknown Skin turns red   Morphine Other (See Comments) and Swelling    Patient states that her reaction is that her skin gets a bit red.   No urticaria or airway difficulty.     Prednisone Other (See Comments)    Other reaction(s): Other (See Comments) Stayed up 30 days when pt. Was on prednisone insomnia   Buprenorphine Hcl-Naloxone Hcl    Duloxetine Other (See Comments)    Unknown Unknown   Ketorolac Tromethamine    Lithium     drool   Tramadol     Other reaction(s): Other (See Comments) Seizures   Poison Ivy Extract [Poison Ivy Extract] Rash   Poison Oak Extract [Poison Oak Extract] Rash   Sumac Rash    Labs:  Results for orders placed or performed during the hospital encounter of 01/22/23 (from the past 48 hour(s))  Comprehensive metabolic panel     Status: Abnormal   Collection Time: 01/22/23  5:57 PM  Result Value Ref Range   Sodium 138 135 - 145 mmol/L   Potassium 2.7 (LL) 3.5 - 5.1 mmol/L    Comment: CRITICAL RESULT CALLED TO, READ BACK BY AND VERIFIED WITH LINDSAY LOT RN @ 1838 01/22/23 [facility]    Chloride 102 98 - 111 mmol/L   CO2 24 22 - 32 mmol/L   Glucose, Bld 118 (H) 70 - 99 mg/dL    Comment: Glucose reference range applies only to samples  taken after fasting for at least 8 hours.   BUN <5 (L) 6 - 20 mg/dL   Creatinine, Ser 9.62 0.44 - 1.00 mg/dL   Calcium 9.1 8.9 - 95.2 mg/dL   Total Protein 8.0 6.5 - 8.1 g/dL   Albumin 4.4 3.5 - 5.0 g/dL   AST 13 (L) 15 - 41 U/L   ALT 11 0 - 44 U/L   Alkaline Phosphatase 77 38 - 126 U/L   Total Bilirubin 0.7 0.3 - 1.2 mg/dL   GFR, Estimated >84 >13 mL/min    Comment: (NOTE) Calculated using the CKD-EPI Creatinine Equation (2021)    Anion gap 12 5 - 15  Comment: Performed at [facility], [address] [place], [place] [address]  Ethanol     Status: None   Collection Time: 01/22/23  5:57 PM  Result Value Ref Range   Alcohol, Ethyl (B) <10 <10 mg/dL    Comment: (NOTE) Lowest detectable limit for serum alcohol is 10 mg/dL.  For medical purposes only. Performed at [facility], [address] [place], [place] [address]   Salicylate level     Status: Abnormal   Collection Time: 01/22/23  5:57 PM  Result Value Ref Range   Salicylate Lvl <7.0 (L) 7.0 - 30.0 mg/dL    Comment: Performed at [facility], [address] [place], [place] [address]  Acetaminophen level     Status: Abnormal   Collection Time: 01/22/23  5:57 PM  Result Value Ref Range   Acetaminophen (Tylenol), Serum <10 (L) 10 - 30 ug/mL    Comment: (NOTE) Therapeutic concentrations vary significantly. A range of 10-30 ug/mL  may be an effective concentration for many patients. However, some  are best treated at concentrations outside of this range. Acetaminophen concentrations >150 ug/mL at 4 hours after ingestion  and >50 ug/mL at 12 hours after ingestion are often associated with  toxic reactions.  Performed at [facility], [address] [place], [place] [address]   cbc     Status: Abnormal   Collection Time: 01/22/23  5:57 PM  Result Value Ref Range   WBC 12.6 (H) 4.0 - 10.5 K/uL   RBC 4.68 3.87 - 5.11 MIL/uL   Hemoglobin 14.2 12.0 - 15.0 g/dL   HCT 10.2 72.5  - 36.6 %   MCV 88.9 80.0 - 100.0 fL   MCH 30.3 26.0 - 34.0 pg   MCHC 34.1 30.0 - 36.0 g/dL   RDW 44.0 34.7 - 42.5 %   Platelets 394 150 - 400 K/uL   nRBC 0.0 0.0 - 0.2 %    Comment: Performed at [facility], [address], [place], [place] [address]  Magnesium     Status: None   Collection Time: 01/22/23  7:32 PM  Result Value Ref Range   Magnesium 1.8 1.7 - 2.4 mg/dL    Comment: Performed at [facility], [address] [place], [place] [address]    Current Facility-Administered Medications  Medication Dose Route Frequency Provider Last Rate Last Admin   hydrOXYzine (ATARAX) tablet 25 mg  25 mg Oral TID PRN Sharyn Creamer, MD       Melene Muller ON 01/23/2023] PARoxetine (PAXIL) tablet 20 mg  20 mg Oral Daily Sharyn Creamer, MD       potassium chloride SA (KLOR-CON M) CR tablet 40 mEq  40 mEq Oral Once Sharyn Creamer, MD       QUEtiapine (SEROQUEL) tablet 400 mg  400 mg Oral QHS Sharyn Creamer, MD       traZODone (DESYREL) tablet 50 mg  50 mg Oral QHS PRN Sharyn Creamer, MD       Current Outpatient Medications  Medication Sig Dispense Refill   Buprenorphine HCl-Naloxone HCl 8-2 MG FILM Place 1 Film under the tongue 2 (two) times daily. 60 each 0   clonazePAM (KLONOPIN) 1 MG tablet Take 1 tablet (1 mg total) by mouth 4 (four) times daily. 120 tablet 0   hydrOXYzine (ATARAX) 25 MG tablet Take 1 tablet (25 mg total) by mouth 3 (three) times daily as needed for anxiety (Sleep). 30 tablet 0   lacosamide (VIMPAT) 50 MG TABS  tablet Take 1 tablet (50 mg total) by mouth 2 (two) times daily. (Patient not taking: Reported on 12/21/2022) 60 tablet 0   levETIRAcetam (KEPPRA) 1000 MG tablet Take 1,000 mg by mouth 2 (two) times daily. (Patient not taking: Reported on 12/21/2022)     methocarbamol (ROBAXIN) 500 MG tablet Take 1 tablet (500 mg total) by mouth 3 (three) times daily. (Patient not taking: Reported on 12/21/2022) 90 tablet 0   nicotine (NICODERM CQ - DOSED IN MG/24 HOURS) 21 mg/24hr patch  Place 1 patch (21 mg total) onto the skin daily. 28 patch 0   PARoxetine (PAXIL) 20 MG tablet Take 1 tablet (20 mg total) by mouth daily. 30 tablet 0   QUEtiapine (SEROQUEL) 400 MG tablet Take 2 tablets (800 mg total) by mouth at bedtime. 60 tablet 1   traZODone (DESYREL) 50 MG tablet Take 1 tablet (50 mg total) by mouth at bedtime as needed for sleep. 30 tablet 0    Musculoskeletal: Strength & Muscle Tone: within normal limits Gait & Station: normal Patient leans: N/A  Psychiatric Specialty Exam:  Presentation  General Appearance:  Bizarre; Disheveled  Eye Contact: None  Speech: Blocked  Speech Volume: Decreased  Handedness: Right   Mood and Affect  Mood: -- (unable to assess)  Affect: Flat; Inappropriate; Other (comment) (unable to assess)   Thought Process  Thought Processes: -- (unable to assess)  Descriptions of Associations:Tangential  Orientation:-- (unable to assess)  Thought Content:-- (unable to assess)  History of Schizophrenia/Schizoaffective disorder:Yes  Duration of Psychotic Symptoms:Greater than six months  Hallucinations:Hallucinations: -- (unable to assess)  Ideas of Reference:-- (unable to assess)  Suicidal Thoughts:Suicidal Thoughts: -- (unable to assess)  Homicidal Thoughts:Homicidal Thoughts: -- (unable to assess)   Sensorium  Memory: -- (unable to assess)  Judgment: Impaired  Insight: None   Executive Functions  Concentration: Poor  Attention Span: Poor  Recall: Poor  Fund of Knowledge: Poor  Language: Poor   Psychomotor Activity  Psychomotor Activity: Psychomotor Activity: -- (unable to assess)   Assets  Assets: -- (unable to assess)   Sleep  Sleep: Sleep: -- (unable to assess)   Physical Exam: Physical Exam Constitutional:      General: She is in acute distress.  HENT:     Head: Normocephalic and atraumatic.     Nose: Nose normal.  Eyes:     Extraocular Movements: Extraocular  movements intact.  Pulmonary:     Effort: Pulmonary effort is normal.  Musculoskeletal:        General: Normal range of motion.     Cervical back: Normal range of motion.  Skin:    General: Skin is dry.  Neurological:     Mental Status: She is oriented to person, place, and time.  Psychiatric:        Attention and Perception: She is inattentive.        Mood and Affect: Affect is inappropriate.        Speech: She is noncommunicative.        Behavior: Behavior is withdrawn.        Cognition and Memory: Cognition is impaired.        Judgment: Judgment is impulsive and inappropriate.    Review of Systems  Psychiatric/Behavioral:  Positive for hallucinations.   All other systems reviewed and are negative.  Blood pressure 96/83, pulse 92, temperature 98.8 F (37.1 C), temperature source Oral, resp. rate 18, height 5\' 1"  (1.549 m), weight 63.5 kg, SpO2 97%. Body mass index is  26.45 kg/m.  Treatment Plan Summary: Daily contact with patient to assess and evaluate symptoms and progress in treatment and Medication management  Disposition: Recommend psychiatric Inpatient admission when medically cleared. Supportive therapy provided about ongoing stressors. Discussed crisis plan, support from social network, calling 911, coming to the Emergency Department, and calling Suicide Hotline.  Jearld Lesch, NP 01/22/2023 11:30 PM

## 2023-01-22 NOTE — ED Notes (Signed)
Pt dressed by this tech and ACS deputy in wine colored/blue paper scrubs. Pt items placed in belongings bag are the following: Blue sneakers Black pants Grey shirt  Pt not entirely certain how to follow directions but is attempting to.

## 2023-01-22 NOTE — BH Assessment (Addendum)
Addendum:  Pt assessed by psychiatry.  Meets criteria for inpatient hospitalization.  Pt to ER in police custody for psychiatric evaluation. Police were dispatched by patient's daughter who reported pt wandering around naked in the front yard. PD confirms patient was walking around in her yard naked upon their arrival. Pt has known psychiatric diagnoses.  TTS made attempt to assess patient.  Patient is in a confused and disoriented state.  Patient is unable to carry on a logical and coherent conversation at the present time.    Patient to be re-evaluated tomorrow morning.

## 2023-01-22 NOTE — ED Provider Notes (Signed)
Patient no longer redirectable, attempting to get up from bed, ambulate, and is confused.  Patient appears to be at risk to self.  Given IM Versed and Benadryl with good effect.  Patient resting comfortably thereafter  Seen and evaluated by psychiatry team who recommends psychiatric inpatient admission   Sharyn Creamer, MD 01/22/23 2247

## 2023-01-23 DIAGNOSIS — F259 Schizoaffective disorder, unspecified: Secondary | ICD-10-CM | POA: Diagnosis not present

## 2023-01-23 LAB — PREGNANCY, URINE: Preg Test, Ur: NEGATIVE

## 2023-01-23 MED ORDER — POTASSIUM CHLORIDE CRYS ER 20 MEQ PO TBCR
60.0000 meq | EXTENDED_RELEASE_TABLET | Freq: Once | ORAL | Status: AC
  Administered 2023-01-23: 20 meq via ORAL
  Filled 2023-01-23: qty 3

## 2023-01-23 NOTE — BH Assessment (Signed)
Per [facility] AC Alcario Drought), patient to be referred out of system.  Referral information for Psychiatric Hospitalization faxed to;   [facility] [phone]),  [place] [phone]--- [phone]--- [phone]--- [phone])  [address] [phone]),   [facility] [phone] -or- [phone]),   [facility] [phone]),  [place] [phone])  [facility] [phone])

## 2023-01-23 NOTE — BH Assessment (Addendum)
Patient has been accepted to [facility].  Patient assigned to [facility]. Accepting physician is Dr. Betti Cruz.  Call report to [phone].  Representative was [employer].   ER Staff is aware of it:  Jackie Plum, ER Secretary  Dr. Roxan Hockey, ER MD  Pattricia Boss, Patient's Nurse     Patient can arrive at facility 01/24/23 after 7 AM.

## 2023-01-23 NOTE — ED Notes (Signed)
Pt walked to nursing desk- pt with very disorganized thoughts & flight of ideas at this time. Pt states "those people with the body parts, they know what they did to me and that child". This RN and NT escorted patient back to room.

## 2023-01-23 NOTE — ED Notes (Signed)
Ate all of breakfast. Requesting medications. States she feels hot. Skin warm and dry.

## 2023-01-23 NOTE — ED Notes (Signed)
Patient awake and alert. Sitting up in bed responding appropriately. States she wants to go home.

## 2023-01-23 NOTE — ED Notes (Signed)
This tech unsuccessfully attempted to obtain vital signs from pt. Pt was extremely irritated as this Clinical research associate was trying to redirect pt to her room. Electronics engineer at bedside as well.

## 2023-01-23 NOTE — ED Notes (Signed)
IVC PENDING RE-EVALUATION

## 2023-01-23 NOTE — ED Notes (Signed)
Offered Snack. Patient refused. Patient came out in hallway stating she needed to go home. States she needs to make this world a better place. Guided her back to her room.

## 2023-01-23 NOTE — ED Provider Notes (Signed)
Emergency Medicine Observation Re-evaluation Note  [name] is a 42 y.o. female, seen on rounds today.  Pt initially presented to the ED for complaints of Psychiatric Evaluation  Currently, the patient is calm, no acute complaints.  Physical Exam  Blood pressure 96/83, pulse 92, temperature 98.8 F (37.1 C), temperature source Oral, resp. rate 18, height 5\' 1"  (1.549 m), weight 63.5 kg, SpO2 97%. Physical Exam General: NAD Lungs: CTAB Psych: not agitated  ED Course / MDM  EKG:    I have reviewed the labs performed to date as well as medications administered while in observation.  Recent changes in the last 24 hours include no acute events overnight.    Plan  Current plan is for psych eval/disposition. Patient is under full IVC at this time.   Sharman Cheek, MD 01/23/23 [phone]

## 2023-01-23 NOTE — ED Notes (Signed)
Nurse tech attmepting to obtain vitals from patient, patient begins yelling at nurse tech, stating "I'm gonna fucking stand up to you". This RN went to bedside along with officer, patient continues to yell at staff and get into staff's face stating "& I'm gonna stand up to you too". Staff left bedside to avoid further escalating patient at this time. Door closed for patient and staff safety

## 2023-01-23 NOTE — ED Notes (Signed)
Pt requesting klonopin- informed this is not an active order at this time. Pt reports "I just need something". Pt restless in room. PRN hydroxyzine given at this time. Patient updated that urine sample is still needed if she can provide one.

## 2023-01-23 NOTE — ED Notes (Signed)
Patient states I am very anxious I need something. 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 PRN medication given.

## 2023-01-23 NOTE — ED Notes (Signed)
ED Med made aware patient would only take 1 potassium pill.

## 2023-01-23 NOTE — ED Notes (Signed)
ENVIRONMENTAL ASSESSMENT Potentially harmful objects out of patient reach: Yes.   Personal belongings secured: Yes.   Patient dressed in hospital provided attire only: Yes.   Plastic bags out of patient reach: Yes.   Patient care equipment (cords, cables, call bells, lines, and drains) shortened, removed, or accounted for: Yes.   Equipment and supplies removed from bottom of stretcher: Yes.   Potentially toxic materials out of patient reach: Yes.   Sharps container removed or out of patient reach: Yes.    

## 2023-01-24 DIAGNOSIS — F259 Schizoaffective disorder, unspecified: Secondary | ICD-10-CM | POA: Diagnosis not present

## 2023-01-24 LAB — BASIC METABOLIC PANEL
Anion gap: 14 (ref 5–15)
BUN: 6 mg/dL (ref 6–20)
CO2: 23 mmol/L (ref 22–32)
Calcium: 9 mg/dL (ref 8.9–10.3)
Chloride: 100 mmol/L (ref 98–111)
Creatinine, Ser: 0.76 mg/dL (ref 0.44–1.00)
GFR, Estimated: >60 mL/min (ref >60)
Glucose, Bld: 116 mg/dL — ABNORMAL HIGH (ref 70–99)
Potassium: 2.7 mmol/L — CL (ref 3.5–5.1)
Sodium: 137 mmol/L (ref 135–145)

## 2023-01-24 MED ORDER — POTASSIUM CHLORIDE 20 MEQ PO PACK
40.0000 meq | PACK | Freq: Once | ORAL | Status: DC
  Filled 2023-01-24: qty 2

## 2023-01-24 MED ORDER — POTASSIUM CHLORIDE 20 MEQ PO PACK
40.0000 meq | PACK | Freq: Two times a day (BID) | ORAL | Status: DC
  Administered 2023-01-24: 40 meq via ORAL
  Filled 2023-01-24 (×2): qty 2

## 2023-01-24 MED ORDER — POTASSIUM CHLORIDE CRYS ER 20 MEQ PO TBCR
40.0000 meq | EXTENDED_RELEASE_TABLET | Freq: Once | ORAL | Status: AC
  Administered 2023-01-24: 40 meq via ORAL
  Filled 2023-01-24: qty 2

## 2023-01-24 MED ORDER — LORAZEPAM 2 MG PO TABS
2.0000 mg | ORAL_TABLET | Freq: Once | ORAL | Status: AC
  Administered 2023-01-24: 2 mg via ORAL
  Filled 2023-01-24: qty 1

## 2023-01-24 MED ORDER — LEVETIRACETAM 500 MG PO TABS
1000.0000 mg | ORAL_TABLET | Freq: Two times a day (BID) | ORAL | Status: DC
  Administered 2023-01-24 – 2023-01-25 (×2): 1000 mg via ORAL
  Filled 2023-01-24 (×3): qty 2

## 2023-01-24 NOTE — ED Notes (Signed)
Attempted to give patient ordered PO meds. Pt refused, stating, "you're a liar." Pt calm but refuses to take medications as ordered.

## 2023-01-24 NOTE — ED Notes (Signed)
ivc/consult done/pt accepted to [facility]/patient can arrive at facility 01/24/23 after 7 am.

## 2023-01-24 NOTE — ED Notes (Signed)
IVC/PENDING DISPOSITION

## 2023-01-24 NOTE — ED Notes (Signed)
Patient in room watching TV. Patent would not allow me to change her bed sheet at this time.

## 2023-01-24 NOTE — ED Notes (Signed)
Called to give report at [facility]. Was able to give report, but staff RN stated patient's potassium needed to be redrawn before they accept her. Spoke to Dr. Cyril Loosen, who ordered another dose of potassium due to original potassium of 2.7 (patient has chronically low potassium levels). Patient chewed up half of one of the potassium, spit out other half, and refused to take second tablet. Lab to be redrawn now per Dr. Cyril Loosen. Awaiting potassium result to be able to transfer patient.

## 2023-01-24 NOTE — ED Notes (Signed)
Snack was given to pt. 

## 2023-01-24 NOTE — ED Notes (Signed)
Pt is becoming agitated, coming out of room, refusing to wear her pants. Redirected back to room multiple times by officer and security. MD notified, order for PO Ativan obtained. When this RN took medication to patient, states "I'm scared to death of everyone." When asked what is making her feel afraid, states, "It's [place]. It's lies. It's all lies." Pt eventually agreed to take PO Ativan. Went back into room and closed door.

## 2023-01-24 NOTE — ED Notes (Signed)
Pt came out of room and began arguing with officer in unit. Pt redirected back to room by officer, but sat on floor in the hallway prior to getting all the way back to room. Officer assisted patient back into room.

## 2023-01-25 DIAGNOSIS — F259 Schizoaffective disorder, unspecified: Secondary | ICD-10-CM | POA: Diagnosis not present

## 2023-01-25 LAB — BASIC METABOLIC PANEL
Anion gap: 6 (ref 5–15)
BUN: 8 mg/dL (ref 6–20)
CO2: 30 mmol/L (ref 22–32)
Calcium: 8.5 mg/dL — ABNORMAL LOW (ref 8.9–10.3)
Chloride: 102 mmol/L (ref 98–111)
Creatinine, Ser: 0.8 mg/dL (ref 0.44–1.00)
GFR, Estimated: >60 mL/min (ref >60)
Glucose, Bld: 103 mg/dL — ABNORMAL HIGH (ref 70–99)
Potassium: 3 mmol/L — ABNORMAL LOW (ref 3.5–5.1)
Sodium: 138 mmol/L (ref 135–145)

## 2023-01-25 LAB — MAGNESIUM: Magnesium: 2.1 mg/dL (ref 1.7–2.4)

## 2023-01-25 MED ORDER — NICOTINE 21 MG/24HR TD PT24
21.0000 mg | MEDICATED_PATCH | Freq: Once | TRANSDERMAL | Status: DC
  Administered 2023-01-25: 21 mg via TRANSDERMAL
  Filled 2023-01-25: qty 1

## 2023-01-25 MED ORDER — POTASSIUM CHLORIDE CRYS ER 20 MEQ PO TBCR
40.0000 meq | EXTENDED_RELEASE_TABLET | Freq: Once | ORAL | Status: AC
  Administered 2023-01-25: 40 meq via ORAL
  Filled 2023-01-25: qty 2

## 2023-01-25 NOTE — ED Notes (Signed)
Transferred via [facility]. Sent with 1 bag of belonging given to transport. Ambulatory, pt in NAD

## 2023-01-25 NOTE — ED Notes (Signed)
EMTALA reviewed by this RN.  

## 2023-01-25 NOTE — ED Provider Notes (Signed)
Emergency Medicine Observation Re-evaluation Note  [name] is a 42 y.o. female, seen on rounds today.  Pt initially presented to the ED for complaints of Psychiatric Evaluation Currently, the patient is resting, voices no medical complaints.  Physical Exam  BP 121/80 (BP Location: Right Arm)   Pulse (!) 107   Temp 98.7 F (37.1 C) (Oral)   Resp 17   Ht 5\' 1"  (1.549 m)   Wt 63.5 kg   SpO2 95%   BMI 26.45 kg/m  Physical Exam General: Resting in no acute distress Cardiac: No cyanosis Lungs: Equal rise and fall Psych: Not agitated  ED Course / MDM  EKG:   I have reviewed the labs performed to date as well as medications administered while in observation.  Recent changes in the last 24 hours include no events overnight.  Plan  Current plan is for psychiatric disposition.    Irean Hong, MD 01/25/23 [phone]

## 2023-01-25 NOTE — ED Notes (Signed)
Pt requested shower; provided clean hospital clothing and linens.  Shower setup provided with soap, shampoo, toothbrush/toothpaste, and deodorant.  Pt able to preform own ADL's with no assistance.    

## 2023-01-25 NOTE — ED Provider Notes (Signed)
Emergency Medicine Observation Re-evaluation Note  [name] is a 42 y.o. female, seen on rounds today.  Pt initially presented to the ED for complaints of Psychiatric Evaluation  Currently, the patient is is no acute distress. Denies any concerns at this time.  Physical Exam  Blood pressure 104/77, pulse 87, temperature 98.2 F (36.8 C), temperature source Oral, resp. rate 16, height 5\' 1"  (1.549 m), weight 63.5 kg, SpO2 98%.  Physical Exam: General: No apparent distress Pulm: Normal WOB Neuro: Moving all extremities Psych: Resting comfortably     ED Course / MDM     I have reviewed the labs performed to date as well as medications administered while in observation.  Recent changes in the last 24 hours include: No acute events overnight. Improvement of potassium today to 3.0.  Patient tolerating p.o.  Normal magnesium level.  Patient was accepted to [facility] with plans for transfer.  Plan   Current plan: Patient awaiting placement at [facility]. Patient is under full IVC at this time.    Corena Herter, MD 01/25/23 1316

## 2023-02-24 ENCOUNTER — Emergency Department
Admission: EM | Admit: 2023-02-24 | Discharge: 2023-02-25 | Disposition: A | Discharge status: Home / Self Care | Payer: Medicare HMO | Attending: Emergency Medicine | Admitting: Emergency Medicine

## 2023-02-24 ENCOUNTER — Other Ambulatory Visit: Payer: Self-pay

## 2023-02-24 DIAGNOSIS — R569 Unspecified convulsions: Secondary | ICD-10-CM | POA: Insufficient documentation

## 2023-02-24 DIAGNOSIS — F250 Schizoaffective disorder, bipolar type: Secondary | ICD-10-CM | POA: Insufficient documentation

## 2023-02-24 DIAGNOSIS — F259 Schizoaffective disorder, unspecified: Secondary | ICD-10-CM

## 2023-02-24 DIAGNOSIS — E876 Hypokalemia: Secondary | ICD-10-CM | POA: Diagnosis not present

## 2023-02-24 LAB — COMPREHENSIVE METABOLIC PANEL
ALT: 12 U/L (ref 0–44)
AST: 14 U/L — ABNORMAL LOW (ref 15–41)
Albumin: 4 g/dL (ref 3.5–5.0)
Alkaline Phosphatase: 77 U/L (ref 38–126)
Anion gap: 13 (ref 5–15)
BUN: 10 mg/dL (ref 6–20)
CO2: 24 mmol/L (ref 22–32)
Calcium: 9.1 mg/dL (ref 8.9–10.3)
Chloride: 104 mmol/L (ref 98–111)
Creatinine, Ser: 0.91 mg/dL (ref 0.44–1.00)
GFR, Estimated: >60 mL/min (ref >60)
Glucose, Bld: 96 mg/dL (ref 70–99)
Potassium: 2.9 mmol/L — ABNORMAL LOW (ref 3.5–5.1)
Sodium: 141 mmol/L (ref 135–145)
Total Bilirubin: 0.7 mg/dL (ref 0.3–1.2)
Total Protein: 7.8 g/dL (ref 6.5–8.1)

## 2023-02-24 LAB — CBC WITH DIFFERENTIAL/PLATELET
Abs Immature Granulocytes: 0.05 10*3/uL (ref 0.00–0.07)
Basophils Absolute: 0.1 10*3/uL (ref 0.0–0.1)
Basophils Relative: 1 %
Eosinophils Absolute: 0 10*3/uL (ref 0.0–0.5)
Eosinophils Relative: 0 %
HCT: 43.1 % (ref 36.0–46.0)
Hemoglobin: 14.2 g/dL (ref 12.0–15.0)
Immature Granulocytes: 1 %
Lymphocytes Relative: 21 %
Lymphs Abs: 2 10*3/uL (ref 0.7–4.0)
MCH: 30.5 pg (ref 26.0–34.0)
MCHC: 32.9 g/dL (ref 30.0–36.0)
MCV: 92.7 fL (ref 80.0–100.0)
Monocytes Absolute: 0.6 10*3/uL (ref 0.1–1.0)
Monocytes Relative: 7 %
Neutro Abs: 7 10*3/uL (ref 1.7–7.7)
Neutrophils Relative %: 70 %
Platelets: 329 10*3/uL (ref 150–400)
RBC: 4.65 MIL/uL (ref 3.87–5.11)
RDW: 14.9 % (ref 11.5–15.5)
WBC: 9.8 10*3/uL (ref 4.0–10.5)
nRBC: 0.2 % (ref 0.0–0.2)

## 2023-02-24 LAB — HCG, QUANTITATIVE, PREGNANCY: hCG, Beta Chain, Quant, S: 1 m[IU]/mL (ref <5)

## 2023-02-24 LAB — SALICYLATE LEVEL: Salicylate Lvl: <7 mg/dL — ABNORMAL LOW (ref 7.0–30.0)

## 2023-02-24 LAB — ETHANOL: Alcohol, Ethyl (B): <10 mg/dL (ref <10)

## 2023-02-24 LAB — ACETAMINOPHEN LEVEL: Acetaminophen (Tylenol), Serum: <10 ug/mL — ABNORMAL LOW (ref 10–30)

## 2023-02-24 LAB — MAGNESIUM: Magnesium: 2.2 mg/dL (ref 1.7–2.4)

## 2023-02-24 MED ORDER — LEVETIRACETAM IN NACL 1000 MG/100ML IV SOLN
1000.0000 mg | Freq: Once | INTRAVENOUS | Status: AC
  Administered 2023-02-24: 1000 mg via INTRAVENOUS
  Filled 2023-02-24: qty 100

## 2023-02-24 MED ORDER — LACOSAMIDE 50 MG PO TABS: 50.0000 mg | ORAL_TABLET | Freq: Two times a day (BID) | ORAL | Status: DC

## 2023-02-24 MED ORDER — POTASSIUM CHLORIDE 10 MEQ/100ML IV SOLN
10.0000 meq | INTRAVENOUS | Status: DC
  Filled 2023-02-24: qty 100

## 2023-02-24 MED ORDER — POTASSIUM CHLORIDE CRYS ER 20 MEQ PO TBCR
40.0000 meq | EXTENDED_RELEASE_TABLET | Freq: Once | ORAL | Status: AC
  Administered 2023-02-24: 40 meq via ORAL
  Filled 2023-02-24: qty 2

## 2023-02-24 MED ORDER — ACETAMINOPHEN 325 MG PO TABS
650.0000 mg | ORAL_TABLET | Freq: Once | ORAL | Status: AC
  Administered 2023-02-24: 650 mg via ORAL
  Filled 2023-02-24: qty 2

## 2023-02-24 MED ORDER — CLONAZEPAM 0.5 MG PO TABS
1.0000 mg | ORAL_TABLET | Freq: Four times a day (QID) | ORAL | Status: DC
  Administered 2023-02-24 – 2023-02-25 (×6): 1 mg via ORAL
  Filled 2023-02-24 (×6): qty 1

## 2023-02-24 MED ORDER — LACOSAMIDE 50 MG PO TABS
50.0000 mg | ORAL_TABLET | Freq: Two times a day (BID) | ORAL | Status: DC
  Administered 2023-02-24 – 2023-02-25 (×3): 50 mg via ORAL
  Filled 2023-02-24 (×3): qty 1

## 2023-02-24 MED ORDER — LEVETIRACETAM IN NACL 1000 MG/100ML IV SOLN
INTRAVENOUS | Status: AC
  Filled 2023-02-24: qty 100

## 2023-02-24 MED ORDER — LEVETIRACETAM 500 MG PO TABS
1000.0000 mg | ORAL_TABLET | Freq: Two times a day (BID) | ORAL | Status: DC
  Administered 2023-02-24 – 2023-02-25 (×2): 1000 mg via ORAL
  Filled 2023-02-24 (×2): qty 2
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
## 2023-02-24 NOTE — ED Notes (Signed)
Pt belongings, 1 pair fleece pants, one red shirt

## 2023-02-24 NOTE — ED Notes (Signed)
Patient received breakfast tray and beverage. 

## 2023-02-24 NOTE — ED Notes (Signed)
Patient received lunch tray and beverage. 

## 2023-02-24 NOTE — BH Assessment (Signed)
Comprehensive Clinical Assessment (CCA) Screening, Triage and Referral Note  02/24/2023 [name] [id number] Recommendations for Services/Supports/Treatments: Psych consult/disposition pending. [name] is a 42 year old, English speaking, Caucasian female with a reported history of schizoaffective disorder. Per triage note: Pt had witnessed seizure at 0230. Pt states she has not been taking seizure medication bc she doesn't have enough time. Pt is slightly confused but alert and ambulatory from stretcher. Denies pain or discomfort.  Pt noted to be sitting up and holding her head upon this writer's arrival. Pt was unable to give a lucid account of her current situation. Pt's speech not logical or relevant to the questions asked. Pt kept repeating "thank you".  Motor behavior appears slowed. Pt seemed slightly confused. Pt's mood is sullen, affect is flat. Patient was noted to have poor insight as she denied having problems/stressors. Pt endorsed having anxiety. The patient denied symptoms of depression, SI, HI, AV/hallucinations, or symptoms of paranoia. Patient was polite and mannerable throughout the interview process. Chief Complaint:  Chief Complaint  Patient presents with   Seizures   Visit Diagnosis: Schizoaffective disorder, bipolar type  Patient Reported Information How did you hear about [place]? Self  What Is the Reason for Your Visit/Call Today? Pt had witnessed seizure at 0230. Pt states she has not been taking seizure medication bc she doesn't have enough time. Pt is slightly confused but alert and ambulatory from stretcher.  How Long Has This Been Causing You Problems? > than 6 months  What Do You Feel Would Help You the Most Today? -- ([name])   Have You Recently Had Any Thoughts About Hurting Yourself? No  Are You Planning to Commit Suicide/Harm Yourself At This time? No   Have you Recently Had Thoughts About Hurting Someone [name]? No  Are You Planning to  Harm Someone at This Time? No  Explanation: Pt unable to articulate presenting problems.   Have You Used Any Alcohol or Drugs in the Past 24 Hours? No  How Long Ago Did You Use Drugs or Alcohol? No data recorded What Did You Use and How Much? n/a   Do You Currently Have a Therapist/Psychiatrist? -- ([name])  Name of Therapist/Psychiatrist: Unknown   Have You Been Recently Discharged From Any Office Practice or Programs? No  Explanation of Discharge From Practice/Program: n/a    CCA Screening Triage Referral Assessment Type of Contact: Face-to-Face  Telemedicine Service Delivery:   Is this Initial or Reassessment?   Date Telepsych consult ordered in [facility]:    Time Telepsych consult ordered in [facility]:    Location of Assessment: [facility] ED  Provider Location: [facility] ED    Collateral Involvement: None provided   Does Patient Have a Court Appointed Legal Guardian? No data recorded Name and Contact of Legal Guardian: No data recorded If Minor and Not Living with Parent(s), Who has Custody? n/a  Is CPS involved or ever been involved? Never  Is APS involved or ever been involved? Never   Patient Determined To Be At Risk for Harm To Self or Others Based on Review of Patient Reported Information or Presenting Complaint? No  Method: No Plan  Availability of Means: No access or NA  Intent: Vague intent or NA  Notification Required: No need or identified person  Additional Information for Danger to Others Potential: -- (n/a)  Additional Comments for Danger to Others Potential: n/a  Are There Guns or Other Weapons in Your Home? No  Types of Guns/Weapons: n/a  Are These Weapons Safely Secured?                            -- (  n/a)  Who Could Verify You Are Able To Have These Secured: n/a  Do You Have any Outstanding Charges, Pending Court Dates, Parole/Probation? [name]  Contacted To Inform of Risk of Harm To Self or Others: Other: Comment   Does Patient Present under  Involuntary Commitment? Yes    [place] of Residence: [place]   Patient Currently Receiving the Following Services: -- ([name])   Determination of Need: Emergent (2 hours)   Options For Referral: ED Visit   Discharge Disposition:     Jeffren Dombek R Whitten Andreoni, LCAS

## 2023-02-24 NOTE — Consult Note (Cosign Needed Addendum)
  Case discussed and reviewed with Robinette Haines. Patient very well know to our service. 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 Recommend she continue current seizure medications and psychotropic medications for stabilization of epilepsy and psychiatric condition.  Postictal psychosis can develop several days to 1 week following cluster of seizures.  Symptoms do very but include multiple degrees of confusion, delirium, decreased attention span, alterations in sleep wake cycle, increased autonomic activity and often coexist with psychotic features.  Psychosis can present as hallucinations sometimes auditory, visual, somatosensory, paranoia, delusional, mistaken identifications.  Psychosis tends to last about 15 hours to 2 months.   She does not meet criteria for inpatient psychiatry at this time. While future psychiatric events cannot be accurately predicted, the patient does not currently require acute inpatient psychiatric care and does not currently meet [facility] involuntary commitment criteria.   -pt does not require inpt psych hospitalization -seizure work-up and management as per neurology recommendations. It is my thinking that the pt's psychosis is 2/2 seizures (inter-ictal psychosis, post-ictal psychosis), and if seizures were controlled, then confusion/psychosis would resolve.  -continue current home medications Recommend IVC be rescinded at this time, or once deemed medically stable.   TTS to sign off. The above information has been communicated to the primary team Roxan Hockey) and TTS LCSW Kathrynn Running)

## 2023-02-24 NOTE — ED Notes (Signed)
Dinner tray provided

## 2023-02-24 NOTE — ED Notes (Signed)
Patient is cooperative and calm, she states that she loves [place] and wants to marry the guard, she has no complaints, denies Si/hi or avh. Staff will continue to monitor for safety.

## 2023-02-24 NOTE — ED Notes (Signed)
Pt. Put under ivc and moved to bhu#6 awaiting psych consult.

## 2023-02-24 NOTE — ED Notes (Signed)
IVC  CONSULT  DONE  PENDING  PLACEMENT 

## 2023-02-24 NOTE — ED Triage Notes (Signed)
Pt had witnessed seizure at 0230. Pt states she has not been taking seizure medication bc she doesn't have enough time. Pt is slightly confused but alert and ambulatory from stretcher. Denies pain or discomfort.

## 2023-02-25 DIAGNOSIS — R569 Unspecified convulsions: Secondary | ICD-10-CM | POA: Diagnosis not present

## 2023-02-25 NOTE — ED Provider Notes (Signed)
Patient has been evaluated by psychiatry.  Recommending IVC retention.  She remains hemodynamically stable medically cleared.  No sign of persistent seizures.  She is calling family member for a ride and would like to be discharged at this point.  As she is well-known for similar presentations in the past does appear appropriate for outpatient management.   Willy Eddy, MD 02/25/23 [phone]

## 2023-02-25 NOTE — ED Notes (Signed)
ivc/pending placement.. 

## 2023-02-25 NOTE — ED Notes (Signed)
Breakfast tray provided with milk. Pt sitting up in bed eating breakfast.

## 2023-02-25 NOTE — ED Notes (Signed)
Pt received lunch 

## 2023-10-18 ENCOUNTER — Emergency Department
Admission: EM | Admit: 2023-10-18 | Discharge: 2023-10-18 | Disposition: A | Discharge status: Home / Self Care | Attending: Emergency Medicine | Admitting: Emergency Medicine

## 2023-10-18 ENCOUNTER — Emergency Department

## 2023-10-18 DIAGNOSIS — T71193A Asphyxiation due to mechanical threat to breathing due to other causes, assault, initial encounter: Secondary | ICD-10-CM | POA: Insufficient documentation

## 2023-10-18 DIAGNOSIS — Z79899 Other long term (current) drug therapy: Secondary | ICD-10-CM | POA: Diagnosis not present

## 2023-10-18 LAB — CBC
HCT: 39.6 % (ref 36.0–46.0)
Hemoglobin: 13.7 g/dL (ref 12.0–15.0)
MCH: 33 pg (ref 26.0–34.0)
MCHC: 34.6 g/dL (ref 30.0–36.0)
MCV: 95.4 fL (ref 80.0–100.0)
Platelets: 293 10*3/uL (ref 150–400)
RBC: 4.15 MIL/uL (ref 3.87–5.11)
RDW: 12.8 % (ref 11.5–15.5)
WBC: 6.5 10*3/uL (ref 4.0–10.5)
nRBC: 0 % (ref 0.0–0.2)

## 2023-10-18 LAB — BASIC METABOLIC PANEL WITH GFR
Anion gap: 11 (ref 5–15)
BUN: 8 mg/dL (ref 6–20)
CO2: 25 mmol/L (ref 22–32)
Calcium: 9 mg/dL (ref 8.9–10.3)
Chloride: 107 mmol/L (ref 98–111)
Creatinine, Ser: 0.78 mg/dL (ref 0.44–1.00)
GFR, Estimated: >60 mL/min (ref >60)
Glucose, Bld: 92 mg/dL (ref 70–99)
Potassium: 3.6 mmol/L (ref 3.5–5.1)
Sodium: 143 mmol/L (ref 135–145)

## 2023-10-18 LAB — HCG, QUANTITATIVE, PREGNANCY: hCG, Beta Chain, Quant, S: <1 m[IU]/mL (ref <5)

## 2023-10-18 LAB — SALICYLATE LEVEL: Salicylate Lvl: <7 mg/dL — ABNORMAL LOW (ref 7.0–30.0)

## 2023-10-18 LAB — ACETAMINOPHEN LEVEL: Acetaminophen (Tylenol), Serum: <10 ug/mL — ABNORMAL LOW (ref 10–30)

## 2023-10-18 LAB — TROPONIN I (HIGH SENSITIVITY): Troponin I (High Sensitivity): <2 ng/L (ref <18)

## 2023-10-18 LAB — CK: Total CK: 51 U/L (ref 38–234)

## 2023-10-18 MED ORDER — CLONAZEPAM 0.5 MG PO TABS
0.5000 mg | ORAL_TABLET | Freq: Once | ORAL | Status: AC
  Administered 2023-10-18: 0.5 mg via ORAL
  Filled 2023-10-18: qty 1

## 2023-10-18 MED ORDER — LEVETIRACETAM IN NACL 1000 MG/100ML IV SOLN
1000.0000 mg | Freq: Once | INTRAVENOUS | Status: AC
  Administered 2023-10-18: 1000 mg via INTRAVENOUS
  Filled 2023-10-18: qty 100

## 2023-10-18 MED ORDER — LACOSAMIDE 50 MG PO TABS
50.0000 mg | ORAL_TABLET | Freq: Two times a day (BID) | ORAL | Status: DC
  Administered 2023-10-18: 50 mg via ORAL
  Filled 2023-10-18: qty 1

## 2023-10-18 MED ORDER — OXYCODONE HCL 5 MG PO TABS
5.0000 mg | ORAL_TABLET | Freq: Once | ORAL | Status: AC
  Administered 2023-10-18: 5 mg via ORAL
  Filled 2023-10-18: qty 1
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
## 2023-10-18 NOTE — ED Notes (Signed)
 Patient removed her IV and said she was leaving.  Her arm was bandaged, but she refused a final set of vital signs.  She stated that she had to go, and she was going to check on her daughter.  Patient stated that she might come back another day.   She did agree to sign the AMA form electronically.
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
## 2023-10-20 LAB — LEVETIRACETAM LEVEL: Levetiracetam Lvl: 42.6 ug/mL — ABNORMAL HIGH (ref 10.0–40.0)

## 2023-10-22 LAB — LACOSAMIDE: Lacosamide: <0.5 ug/mL — ABNORMAL LOW (ref 5.0–10.0)

## 2023-12-13 ENCOUNTER — Other Ambulatory Visit: Payer: Self-pay

## 2023-12-13 ENCOUNTER — Emergency Department
Admission: EM | Admit: 2023-12-13 | Discharge: 2023-12-13 | Disposition: A | Discharge status: Home / Self Care | Attending: Emergency Medicine | Admitting: Emergency Medicine

## 2023-12-13 DIAGNOSIS — K068 Other specified disorders of gingiva and edentulous alveolar ridge: Secondary | ICD-10-CM | POA: Diagnosis present

## 2023-12-13 DIAGNOSIS — R519 Headache, unspecified: Secondary | ICD-10-CM | POA: Diagnosis not present

## 2023-12-13 LAB — URINALYSIS, ROUTINE W REFLEX MICROSCOPIC
Bilirubin Urine: NEGATIVE
Glucose, UA: NEGATIVE mg/dL
Hgb urine dipstick: NEGATIVE
Ketones, ur: NEGATIVE mg/dL
Leukocytes,Ua: NEGATIVE
Nitrite: NEGATIVE
Protein, ur: NEGATIVE mg/dL
Specific Gravity, Urine: 1 — ABNORMAL LOW (ref 1.005–1.030)
pH: 6 (ref 5.0–8.0)

## 2023-12-13 LAB — COMPREHENSIVE METABOLIC PANEL WITH GFR
ALT: 11 U/L (ref 0–44)
AST: 13 U/L — ABNORMAL LOW (ref 15–41)
Albumin: 3.3 g/dL — ABNORMAL LOW (ref 3.5–5.0)
Alkaline Phosphatase: 63 U/L (ref 38–126)
Anion gap: 6 (ref 5–15)
BUN: 9 mg/dL (ref 6–20)
CO2: 29 mmol/L (ref 22–32)
Calcium: 8.4 mg/dL — ABNORMAL LOW (ref 8.9–10.3)
Chloride: 106 mmol/L (ref 98–111)
Creatinine, Ser: 0.9 mg/dL (ref 0.44–1.00)
GFR, Estimated: >60 mL/min (ref >60)
Glucose, Bld: 103 mg/dL — ABNORMAL HIGH (ref 70–99)
Potassium: 3.3 mmol/L — ABNORMAL LOW (ref 3.5–5.1)
Sodium: 141 mmol/L (ref 135–145)
Total Bilirubin: 0.4 mg/dL (ref 0.0–1.2)
Total Protein: 6.3 g/dL — ABNORMAL LOW (ref 6.5–8.1)

## 2023-12-13 LAB — CBC
HCT: 39.9 % (ref 36.0–46.0)
Hemoglobin: 13.6 g/dL (ref 12.0–15.0)
MCH: 32.6 pg (ref 26.0–34.0)
MCHC: 34.1 g/dL (ref 30.0–36.0)
MCV: 95.7 fL (ref 80.0–100.0)
Platelets: 281 10*3/uL (ref 150–400)
RBC: 4.17 MIL/uL (ref 3.87–5.11)
RDW: 12.4 % (ref 11.5–15.5)
WBC: 5.8 10*3/uL (ref 4.0–10.5)
nRBC: 0 % (ref 0.0–0.2)

## 2023-12-13 LAB — LIPASE, BLOOD: Lipase: 50 U/L (ref 11–51)

## 2023-12-13 LAB — TROPONIN I (HIGH SENSITIVITY): Troponin I (High Sensitivity): <2 ng/L (ref <18)

## 2023-12-13 NOTE — ED Notes (Signed)
 MD was at bedside with patient. This RN witnessed patient abruptly depart from room. Walking without difficulty. NAD. Pt would not stop to communicate with staff. MD followed patient to lobby and spoke with father. Pt eloped at this time.

## 2023-12-13 NOTE — ED Triage Notes (Signed)
 Patient states headache, dizziness and diarrhea; also complaining of blister in mouth from wearing dentures.
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
## 2023-12-17 ENCOUNTER — Emergency Department: Admission: EM | Admit: 2023-12-17 | Discharge: 2023-12-17 | Disposition: A | Discharge status: Home / Self Care

## 2023-12-17 ENCOUNTER — Other Ambulatory Visit: Payer: Self-pay

## 2023-12-17 DIAGNOSIS — R42 Dizziness and giddiness: Secondary | ICD-10-CM | POA: Diagnosis not present

## 2023-12-17 DIAGNOSIS — R519 Headache, unspecified: Secondary | ICD-10-CM | POA: Diagnosis present

## 2023-12-17 LAB — CBC WITH DIFFERENTIAL/PLATELET
Abs Immature Granulocytes: 0.02 10*3/uL (ref 0.00–0.07)
Basophils Absolute: 0.1 10*3/uL (ref 0.0–0.1)
Basophils Relative: 1 %
Eosinophils Absolute: 0.1 10*3/uL (ref 0.0–0.5)
Eosinophils Relative: 2 %
HCT: 35.7 % — ABNORMAL LOW (ref 36.0–46.0)
Hemoglobin: 12.3 g/dL (ref 12.0–15.0)
Immature Granulocytes: 0 %
Lymphocytes Relative: 27 %
Lymphs Abs: 1.6 10*3/uL (ref 0.7–4.0)
MCH: 32.7 pg (ref 26.0–34.0)
MCHC: 34.5 g/dL (ref 30.0–36.0)
MCV: 94.9 fL (ref 80.0–100.0)
Monocytes Absolute: 0.3 10*3/uL (ref 0.1–1.0)
Monocytes Relative: 5 %
Neutro Abs: 4 10*3/uL (ref 1.7–7.7)
Neutrophils Relative %: 65 %
Platelets: 253 10*3/uL (ref 150–400)
RBC: 3.76 MIL/uL — ABNORMAL LOW (ref 3.87–5.11)
RDW: 12.4 % (ref 11.5–15.5)
WBC: 6.1 10*3/uL (ref 4.0–10.5)
nRBC: 0 % (ref 0.0–0.2)

## 2023-12-17 LAB — BASIC METABOLIC PANEL WITH GFR
Anion gap: 10 (ref 5–15)
BUN: 6 mg/dL (ref 6–20)
CO2: 23 mmol/L (ref 22–32)
Calcium: 7.9 mg/dL — ABNORMAL LOW (ref 8.9–10.3)
Chloride: 109 mmol/L (ref 98–111)
Creatinine, Ser: 0.65 mg/dL (ref 0.44–1.00)
GFR, Estimated: >60 mL/min (ref >60)
Glucose, Bld: 92 mg/dL (ref 70–99)
Potassium: 3.3 mmol/L — ABNORMAL LOW (ref 3.5–5.1)
Sodium: 142 mmol/L (ref 135–145)

## 2023-12-17 LAB — HCG, QUANTITATIVE, PREGNANCY: hCG, Beta Chain, Quant, S: <1 m[IU]/mL (ref <5)

## 2023-12-17 MED ORDER — IBUPROFEN 200 MG PO TABS: 600.0000 mg | ORAL_TABLET | Freq: Three times a day (TID) | ORAL | 2 refills | Status: DC | PRN

## 2023-12-17 MED ORDER — METOCLOPRAMIDE HCL 5 MG/ML IJ SOLN
10.0000 mg | Freq: Once | INTRAMUSCULAR | Status: AC
  Administered 2023-12-17: 10 mg via INTRAVENOUS
  Filled 2023-12-17: qty 2

## 2023-12-17 MED ORDER — ACETAMINOPHEN 500 MG PO TABS: 1000.0000 mg | ORAL_TABLET | Freq: Four times a day (QID) | ORAL | 2 refills | Status: DC | PRN

## 2023-12-17 MED ORDER — ACETAMINOPHEN 500 MG PO TABS
1000.0000 mg | ORAL_TABLET | Freq: Once | ORAL | Status: AC
  Administered 2023-12-17: 1000 mg via ORAL
  Filled 2023-12-17: qty 2

## 2023-12-17 MED ORDER — PROCHLORPERAZINE EDISYLATE 10 MG/2ML IJ SOLN
10.0000 mg | Freq: Once | INTRAMUSCULAR | Status: AC
  Administered 2023-12-17: 10 mg via INTRAVENOUS
  Filled 2023-12-17: qty 2

## 2023-12-17 MED ORDER — DIPHENHYDRAMINE HCL 50 MG/ML IJ SOLN
25.0000 mg | Freq: Once | INTRAMUSCULAR | Status: AC
  Administered 2023-12-17: 25 mg via INTRAVENOUS
  Filled 2023-12-17: qty 1

## 2023-12-17 MED ORDER — SODIUM CHLORIDE 0.9 % IV BOLUS
1000.0000 mL | Freq: Once | INTRAVENOUS | Status: AC
  Administered 2023-12-17: 1000 mL via INTRAVENOUS

## 2023-12-17 NOTE — ED Triage Notes (Signed)
 Pt arrives to the ED with the same complaint as the other day of headache and seeing spots. Pt sts that her blood counts are low. Pt is not able to stay on trach during a conversation.

## 2023-12-17 NOTE — Discharge Instructions (Signed)
 Your evaluation in the emergency department was reassuring, and your symptoms improved with treatment.  I do believe you were somewhat dehydrated, and you received IV fluid here in the emergency department.  Use Tylenol  and Motrin  as needed for any recurrent headache.  Continue to drink plenty fluids and follow-up with your primary care provider.  Return to the emergency department with any new or worsening symptoms.

## 2023-12-17 NOTE — ED Provider Notes (Signed)
 [facility] Provider Note    Event Date/Time   First MD Initiated Contact with Patient 12/17/23 1134     (approximate)   History   Headache  Pt arrives to the ED with the same complaint as the other day of headache and seeing spots. Pt sts that her blood counts are low. Pt is not able to stay on trach during a conversation.    HPI [name] is a 43 y.o. female PMH schizoaffective disorder, seizures presents for evaluation of headache, lightheadedness - Patient states over the past few days she has not been eating and drinking as much as usual.  Does have chronic headaches and continues to have 1 at this time, not worst headache of life, no focal weakness, not acute onset, no preceding trauma.  Not on blood thinners.  Appears her primary concern is that she has been feeling lightheaded when she stands up.  No frank syncope.  No chest pain or shortness of breath.  No leg swelling.  Contrary to triage note, patient is able to engage in a full conversation with me without any difficulty.     Physical Exam   Triage Vital Signs: ED Triage Vitals  Encounter Vitals Group     BP 12/17/23 1131 111/62     Girls Systolic BP Percentile --      Girls Diastolic BP Percentile --      Boys Systolic BP Percentile --      Boys Diastolic BP Percentile --      Pulse Rate 12/17/23 1131 (!) 115     Resp 12/17/23 1131 17     Temp 12/17/23 1131 97.7 F (36.5 C)     Temp Source 12/17/23 1131 Oral     SpO2 12/17/23 1131 96 %     Weight 12/17/23 1132 138 lb (62.6 kg)     Height 12/17/23 1132 5' 1 (1.549 m)     Head Circumference --      Peak Flow --      Pain Score 12/17/23 1132 6     Pain Loc --      Pain Education --      Exclude from Growth Chart --     Most recent vital signs: Vitals:   12/17/23 1147 12/17/23 1148  BP: 110/73   Pulse: 93   Resp: 18   Temp: 97.8 F (36.6 C)   SpO2: 98% 98%     General: Awake, no distress.  CV:  Good  peripheral perfusion. RRR, RP 2+ Resp:  Normal effort. CTAB Abd:  No distention. Nontender to deep palpation throughout Neuro:  Aox4, CN II-XII intact, FNF wnl, finger taps fast b/l, 5/5 strength in bilateral finger extension/grip, arm flexion/extension, EHL/FHL. BUE AG 10+ sec no drift, BLE AG 5+ sec no drift. Ambulates with steady gait. SILT. Negative Rhomberg.    ED Results / Procedures / Treatments   Labs (all labs ordered are listed, but only abnormal results are displayed) Labs Reviewed  BASIC METABOLIC PANEL WITH GFR - Abnormal; Notable for the following components:      Result Value   Potassium 3.3 (*)    Calcium 7.9 (*)    All other components within normal limits  CBC WITH DIFFERENTIAL/PLATELET - Abnormal; Notable for the following components:   RBC 3.76 (*)    HCT 35.7 (*)    All other components within normal limits  HCG, QUANTITATIVE, PREGNANCY     EKG  Ecg = sinus rhythm,  rate 89, no ST elevation or depression, some nonspecific T wave inversions, normal axis, normal intervals.  No clear evidence of ischemia no arrhythmia on my read.   RADIOLOGY N/a    PROCEDURES:  Critical Care performed: No  Procedures   MEDICATIONS ORDERED IN ED: Medications  sodium chloride  0.9 % bolus 1,000 mL (1,000 mLs Intravenous New Bag/Given 12/17/23 1245)  metoCLOPramide  (REGLAN ) injection 10 mg (10 mg Intravenous Given 12/17/23 1246)  diphenhydrAMINE  (BENADRYL ) injection 25 mg (25 mg Intravenous Given 12/17/23 1246)  acetaminophen  (TYLENOL ) tablet 1,000 mg (1,000 mg Oral Given 12/17/23 1247)  prochlorperazine (COMPAZINE) injection 10 mg (10 mg Intravenous Given 12/17/23 1325)     IMPRESSION / MDM / ASSESSMENT AND PLAN / ED COURSE  I reviewed the triage vital signs and the nursing notes.                              DDX/MDM/AP: Differential diagnosis includes, but is not limited to, likely orthostasis, doubt transient arrhythmia.  No findings to suggest underlying  infection.  Consider underlying anemia or electrolyte abnormality.  No headache red flags, no concern for acute intracranial pathology at this time.  Plan: - Labs - IV fluid, Reglan , Benadryl , Tylenol  - EKG  Patient's presentation is most consistent with acute complicated illness / injury requiring diagnostic workup.   ED course below.  Workup unremarkable.  Patient feeling much better after headache medications.  Discharged home.  Plan for Tylenol , Motrin , oral hydration, PMD follow-up.  ED return precautions in place.  Patient agrees with plan.  Clinical Course as of 12/17/23 1339  Sun Dec 17, 2023  1227 CBC reviewed, unremarkable [MM]  1321 Patient with some persistent headache despite Tylenol , Reglan , Benadryl , IV fluid.  Will trial Compazine.  Per chart review, last CT head in August of this year, unremarkable, no underlying masses [MM]    Clinical Course User Index [MM] Clarine Ozell LABOR, MD     FINAL CLINICAL IMPRESSION(S) / ED DIAGNOSES   Final diagnoses:  Nonintractable headache, unspecified chronicity pattern, unspecified headache type  Lightheadedness     Rx / DC Orders   ED Discharge Orders          Ordered    acetaminophen  (TYLENOL ) 500 MG tablet  Every 6 hours PRN        12/17/23 1336    ibuprofen  (MOTRIN  IB) 200 MG tablet  Every 8 hours PRN        12/17/23 1336             Note:  This document was prepared using Dragon voice recognition software and may include unintentional dictation errors.   Clarine Ozell LABOR, MD 12/17/23 [phone]

## 2024-01-09 ENCOUNTER — Emergency Department: Admission: EM | Admit: 2024-01-09 | Discharge: 2024-01-09 | Disposition: A | Discharge status: Home / Self Care

## 2024-01-09 ENCOUNTER — Emergency Department

## 2024-01-09 DIAGNOSIS — S0083XA Contusion of other part of head, initial encounter: Secondary | ICD-10-CM | POA: Diagnosis present

## 2024-01-09 MED ORDER — ACETAMINOPHEN 500 MG PO TABS: 1000.0000 mg | ORAL_TABLET | Freq: Four times a day (QID) | ORAL | 0 refills | Status: DC | PRN

## 2024-01-09 MED ORDER — IBUPROFEN 200 MG PO TABS: 600.0000 mg | ORAL_TABLET | Freq: Three times a day (TID) | ORAL | 0 refills | Status: DC | PRN

## 2024-01-09 MED ORDER — IBUPROFEN 600 MG PO TABS
600.0000 mg | ORAL_TABLET | Freq: Once | ORAL | Status: DC
  Filled 2024-01-09: qty 1

## 2024-01-09 MED ORDER — ACETAMINOPHEN 500 MG PO TABS
1000.0000 mg | ORAL_TABLET | Freq: Once | ORAL | Status: DC
  Filled 2024-01-09: qty 2

## 2024-01-09 MED ORDER — METOCLOPRAMIDE HCL 10 MG PO TABS
10.0000 mg | ORAL_TABLET | Freq: Once | ORAL | Status: DC
  Filled 2024-01-09: qty 1

## 2024-01-09 MED ORDER — METOCLOPRAMIDE HCL 10 MG PO TABS: 10.0000 mg | ORAL_TABLET | Freq: Three times a day (TID) | ORAL | 0 refills | Status: DC | PRN
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
## 2024-01-09 NOTE — Discharge Instructions (Signed)
 Please follow-up with your primary care provider and a social worker for ongoing evaluation.  You can use Tylenol  and Motrin  as needed for any ongoing discomfort, I also prescribed you a nausea medication.  Return to the emergency department with any new or worsening symptoms.
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
## 2024-06-08 ENCOUNTER — Emergency Department
Admission: EM | Admit: 2024-06-08 | Discharge: 2024-06-08 | Disposition: A | Discharge status: Other Acute Inpatient Hospital | Source: Home / Self Care | Attending: Emergency Medicine | Admitting: Emergency Medicine

## 2024-06-08 ENCOUNTER — Other Ambulatory Visit: Payer: Self-pay

## 2024-06-08 ENCOUNTER — Inpatient Hospital Stay
Admission: AD | Admit: 2024-06-08 | Discharge: 2024-06-18 | DRG: 885 | Disposition: A | Discharge status: Home / Self Care | Source: Intra-hospital | Attending: Psychiatry | Admitting: Psychiatry

## 2024-06-08 ENCOUNTER — Inpatient Hospital Stay: Admission: RE | Admit: 2024-06-08 | Source: Intra-hospital | Admitting: Child & Adolescent Psychiatry

## 2024-06-08 DIAGNOSIS — M51369 Other intervertebral disc degeneration, lumbar region without mention of lumbar back pain or lower extremity pain: Secondary | ICD-10-CM | POA: Diagnosis present

## 2024-06-08 DIAGNOSIS — Z885 Allergy status to narcotic agent status: Secondary | ICD-10-CM | POA: Diagnosis not present

## 2024-06-08 DIAGNOSIS — G4700 Insomnia, unspecified: Secondary | ICD-10-CM | POA: Diagnosis present

## 2024-06-08 DIAGNOSIS — Z59868 Other specified financial insecurity: Secondary | ICD-10-CM | POA: Diagnosis not present

## 2024-06-08 DIAGNOSIS — Z560 Unemployment, unspecified: Secondary | ICD-10-CM

## 2024-06-08 DIAGNOSIS — F600 Paranoid personality disorder: Secondary | ICD-10-CM | POA: Diagnosis present

## 2024-06-08 DIAGNOSIS — F1290 Cannabis use, unspecified, uncomplicated: Secondary | ICD-10-CM | POA: Diagnosis present

## 2024-06-08 DIAGNOSIS — G40909 Epilepsy, unspecified, not intractable, without status epilepticus: Secondary | ICD-10-CM | POA: Diagnosis present

## 2024-06-08 DIAGNOSIS — F29 Unspecified psychosis not due to a substance or known physiological condition: Secondary | ICD-10-CM | POA: Insufficient documentation

## 2024-06-08 DIAGNOSIS — F250 Schizoaffective disorder, bipolar type: Secondary | ICD-10-CM | POA: Diagnosis present

## 2024-06-08 DIAGNOSIS — Z86718 Personal history of other venous thrombosis and embolism: Secondary | ICD-10-CM

## 2024-06-08 DIAGNOSIS — F3160 Bipolar disorder, current episode mixed, unspecified: Secondary | ICD-10-CM | POA: Insufficient documentation

## 2024-06-08 DIAGNOSIS — F419 Anxiety disorder, unspecified: Secondary | ICD-10-CM | POA: Diagnosis present

## 2024-06-08 DIAGNOSIS — R45851 Suicidal ideations: Secondary | ICD-10-CM | POA: Insufficient documentation

## 2024-06-08 DIAGNOSIS — F319 Bipolar disorder, unspecified: Principal | ICD-10-CM | POA: Diagnosis present

## 2024-06-08 DIAGNOSIS — F17210 Nicotine dependence, cigarettes, uncomplicated: Secondary | ICD-10-CM | POA: Diagnosis present

## 2024-06-08 DIAGNOSIS — Z888 Allergy status to other drugs, medicaments and biological substances status: Secondary | ICD-10-CM | POA: Diagnosis not present

## 2024-06-08 DIAGNOSIS — F259 Schizoaffective disorder, unspecified: Secondary | ICD-10-CM | POA: Diagnosis present

## 2024-06-08 DIAGNOSIS — R569 Unspecified convulsions: Secondary | ICD-10-CM | POA: Diagnosis not present

## 2024-06-08 DIAGNOSIS — Z91040 Latex allergy status: Secondary | ICD-10-CM | POA: Diagnosis not present

## 2024-06-08 LAB — COMPREHENSIVE METABOLIC PANEL WITH GFR
ALT: 9 U/L (ref 0–44)
AST: 11 U/L — ABNORMAL LOW (ref 15–41)
Albumin: 4.5 g/dL (ref 3.5–5.0)
Alkaline Phosphatase: 79 U/L (ref 38–126)
Anion gap: 12 (ref 5–15)
BUN: 6 mg/dL (ref 6–20)
CO2: 27 mmol/L (ref 22–32)
Calcium: 9.2 mg/dL (ref 8.9–10.3)
Chloride: 102 mmol/L (ref 98–111)
Creatinine, Ser: 0.58 mg/dL (ref 0.44–1.00)
GFR, Estimated: >60 mL/min (ref >60)
Glucose, Bld: 116 mg/dL — ABNORMAL HIGH (ref 70–99)
Potassium: 3.8 mmol/L (ref 3.5–5.1)
Sodium: 141 mmol/L (ref 135–145)
Total Bilirubin: 0.4 mg/dL (ref 0.0–1.2)
Total Protein: 7.2 g/dL (ref 6.5–8.1)

## 2024-06-08 LAB — CBC
HCT: 44.2 % (ref 36.0–46.0)
Hemoglobin: 15 g/dL (ref 12.0–15.0)
MCH: 31.7 pg (ref 26.0–34.0)
MCHC: 33.9 g/dL (ref 30.0–36.0)
MCV: 93.4 fL (ref 80.0–100.0)
Platelets: 289 K/uL (ref 150–400)
RBC: 4.73 MIL/uL (ref 3.87–5.11)
RDW: 11.9 % (ref 11.5–15.5)
WBC: 7.9 K/uL (ref 4.0–10.5)
nRBC: 0 % (ref 0.0–0.2)

## 2024-06-08 LAB — URINE DRUG SCREEN
Amphetamines: NEGATIVE
Barbiturates: NEGATIVE
Benzodiazepines: NEGATIVE
Cocaine: NEGATIVE
Fentanyl: NEGATIVE
Methadone Scn, Ur: NEGATIVE
Opiates: NEGATIVE
Tetrahydrocannabinol: POSITIVE — AB

## 2024-06-08 LAB — ETHANOL: Alcohol, Ethyl (B): <15 mg/dL (ref <15)

## 2024-06-08 LAB — POC URINE PREG, ED: Preg Test, Ur: NEGATIVE

## 2024-06-08 MED ORDER — OLANZAPINE 10 MG IM SOLR: 5.0000 mg | Freq: Three times a day (TID) | INTRAMUSCULAR | Status: DC | PRN

## 2024-06-08 MED ORDER — MAGNESIUM HYDROXIDE 400 MG/5ML PO SUSP: 30.0000 mL | Freq: Every day | ORAL | Status: DC | PRN

## 2024-06-08 MED ORDER — NICOTINE 21 MG/24HR TD PT24: 21.0000 mg | MEDICATED_PATCH | Freq: Every day | TRANSDERMAL | Status: DC

## 2024-06-08 MED ORDER — QUETIAPINE FUMARATE 100 MG PO TABS
100.0000 mg | ORAL_TABLET | Freq: Every day | ORAL | Status: DC
  Administered 2024-06-08 – 2024-06-09 (×2): 100 mg via ORAL
  Filled 2024-06-08 (×3): qty 1

## 2024-06-08 MED ORDER — ACETAMINOPHEN 325 MG PO TABS
650.0000 mg | ORAL_TABLET | Freq: Four times a day (QID) | ORAL | Status: DC | PRN
  Administered 2024-06-09 – 2024-06-18 (×14): 650 mg via ORAL
  Filled 2024-06-08 (×14): qty 2

## 2024-06-08 MED ORDER — HYDROXYZINE HCL 50 MG PO TABS
50.0000 mg | ORAL_TABLET | Freq: Three times a day (TID) | ORAL | Status: DC | PRN
  Filled 2024-06-08: qty 1

## 2024-06-08 MED ORDER — PAROXETINE HCL 20 MG PO TABS
20.0000 mg | ORAL_TABLET | Freq: Every day | ORAL | Status: DC
  Administered 2024-06-09 – 2024-06-10 (×2): 20 mg via ORAL
  Filled 2024-06-08 (×2): qty 1

## 2024-06-08 MED ORDER — LEVETIRACETAM 500 MG PO TABS: 500.0000 mg | ORAL_TABLET | Freq: Two times a day (BID) | ORAL | Status: DC

## 2024-06-08 MED ORDER — NICOTINE 21 MG/24HR TD PT24
21.0000 mg | MEDICATED_PATCH | Freq: Every day | TRANSDERMAL | Status: DC
  Administered 2024-06-09 – 2024-06-18 (×10): 21 mg via TRANSDERMAL
  Filled 2024-06-08 (×10): qty 1

## 2024-06-08 MED ORDER — TRAZODONE HCL 50 MG PO TABS
50.0000 mg | ORAL_TABLET | Freq: Every evening | ORAL | Status: DC | PRN
  Administered 2024-06-08 – 2024-06-17 (×7): 50 mg via ORAL
  Filled 2024-06-08 (×9): qty 1

## 2024-06-08 MED ORDER — OLANZAPINE 5 MG PO TBDP
5.0000 mg | ORAL_TABLET | Freq: Three times a day (TID) | ORAL | Status: DC | PRN
  Administered 2024-06-17: 5 mg via ORAL
  Filled 2024-06-08: qty 1

## 2024-06-08 MED ORDER — HYDROXYZINE HCL 25 MG PO TABS
25.0000 mg | ORAL_TABLET | Freq: Three times a day (TID) | ORAL | Status: DC | PRN
  Administered 2024-06-11: 08:00:00 25 mg via ORAL
  Filled 2024-06-08: qty 1

## 2024-06-08 MED ORDER — ALUM & MAG HYDROXIDE-SIMETH 200-200-20 MG/5ML PO SUSP: 30.0000 mL | ORAL | Status: DC | PRN

## 2024-06-08 MED ORDER — TRAZODONE HCL 50 MG PO TABS: 50.0000 mg | ORAL_TABLET | Freq: Every evening | ORAL | Status: DC | PRN

## 2024-06-08 MED ORDER — LORAZEPAM 0.5 MG PO TABS
0.5000 mg | ORAL_TABLET | Freq: Once | ORAL | Status: AC
  Administered 2024-06-08: 0.5 mg via ORAL
  Filled 2024-06-08: qty 1

## 2024-06-08 NOTE — ED Notes (Signed)
Pt provided with cup of water per request
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
## 2024-06-08 NOTE — BH Assessment (Signed)
 Comprehensive Clinical Assessment (CCA) Note  06/08/2024 [name] [id number]  Chief Complaint:  Chief Complaint  Patient presents with   Insomnia   Suicidal   Visit Diagnosis: Psychosis   [name]. [name] is a 43 year old female who presents to the ER because she hasn't slept in three days. Per the report of the patient's father ([phone]), she called him today stating she hasn't slept and need to come to the ER for her medications. During the interview, the patient attempted to participate, but she was tangential and had to be redirected. She was fixated about her children being removed from her care, several years ago. When answering questions, majority of her answers did not relate to the questions. Patient denies HI and AV/H. She endorses SI with no plan. Patient has been to the ER for similar presentations, due to psychosis.   CCA Screening, Triage and Referral (STR)  Patient Reported Information How did you hear about us ? Self  What Is the Reason for Your Visit/Call Today? Brought to the ER because she hasnt slept in three days.  How Long Has This Been Causing You Problems? 1 wk - 1 month  What Do You Feel Would Help You the Most Today? Treatment for Depression or other mood problem   Have You Recently Had Any Thoughts About Hurting Yourself? Yes  Are You Planning to Commit Suicide/Harm Yourself At This time? No   Flowsheet Row ED from 06/08/2024 in [facility] at [facility] ED from 01/09/2024 in [facility] at [facility] ED from 12/17/2023 in [facility] Department at [facility]  C-SSRS RISK CATEGORY Low Risk No Risk No Risk    Have you Recently Had Thoughts About Hurting Someone Sherral? No  Are You Planning to Harm Someone at This Time? No  Explanation: No data recorded  Have You Used Any Alcohol or Drugs in the Past 24 Hours? No  How Long Ago Did You Use Drugs or  Alcohol? No data recorded What Did You Use and How Much? No data recorded  Do You Currently Have a Therapist/Psychiatrist? No  Name of Therapist/Psychiatrist:    Have You Been Recently Discharged From Any Office Practice or Programs? No  Explanation of Discharge From Practice/Program: No data recorded    CCA Screening Triage Referral Assessment Type of Contact: Face-to-Face  Telemedicine Service Delivery:   Is this Initial or Reassessment?   Date Telepsych consult ordered in [facility]:    Time Telepsych consult ordered in [facility]:    Location of Assessment: [facility] ED  Provider Location: [facility] ED   Collateral Involvement: Alyse [name])   Does Patient Have a Automotive Engineer Guardian? No data recorded Legal Guardian Contact Information: No data recorded Copy of Legal Guardianship Form: No data recorded Legal Guardian Notified of Arrival: No data recorded Legal Guardian Notified of Pending Discharge: No data recorded If Minor and Not Living with Parent(s), Who has Custody? No data recorded Is CPS involved or ever been involved? Never  Is APS involved or ever been involved? Never   Patient Determined To Be At Risk for Harm To Self or Others Based on Review of Patient Reported Information or Presenting Complaint? No data recorded Method: No data recorded Availability of Means: No data recorded Intent: No data recorded Notification Required: No data recorded Additional Information for Danger to Others Potential: No data recorded Additional Comments for Danger to Others Potential: No data recorded Are There Guns or Other Weapons in Your Home?  No data recorded Types of Guns/Weapons: No data recorded Are These Weapons Safely Secured?                            No  Who Could Verify You Are Able To Have These Secured: No data recorded Do You Have any Outstanding Charges, Pending Court Dates, Parole/Probation? No data recorded Contacted To Inform of Risk of Harm To Self or Others: No  data recorded   Does Patient Present under Involuntary Commitment? No    [place] of Residence: [place]   Patient Currently Receiving the Following Services: ACTT Psychologist, Educational)   Determination of Need: Emergent (2 hours)   Options For Referral: Inpatient Hospitalization    CCA Biopsychosocial Patient Reported Schizophrenia/Schizoaffective Diagnosis in Past: No   Strengths: Have stable housing, some insight and support system.   Mental Health Symptoms Depression:  Change in energy/activity   Duration of Depressive symptoms: Duration of Depressive Symptoms: N/A   Mania:  Racing thoughts; Change in energy/activity   Anxiety:   Restlessness; Difficulty concentrating   Psychosis:  None   Duration of Psychotic symptoms:    Trauma:  N/A   Obsessions:  N/A   Compulsions:  N/A   Inattention:  N/A   Hyperactivity/Impulsivity:  N/A   Oppositional/Defiant Behaviors:  N/A   Emotional Irregularity:  N/A   Other Mood/Personality Symptoms:  No data recorded   Mental Status Exam Appearance and self-care  Stature:  Average   Weight:  Average weight   Clothing:  Neat/clean   Grooming:  Normal   Cosmetic use:  None   Posture/gait:  Normal   Motor activity:  -- (Within normal range)   Sensorium  Attention:  Distractible; Confused   Concentration:  Focuses on irrelevancies   Orientation:  X5   Recall/memory:  Normal   Affect and Mood  Affect:  Anxious; Appropriate   Mood:  Anxious   Relating  Eye contact:  Fleeting   Facial expression:  Anxious; Sad   Attitude toward examiner:  Cooperative   Thought and Language  Speech flow: Blocked   Thought content:  Persecutions   Preoccupation:  Ruminations   Hallucinations:  No data recorded  Organization:  Insurance Underwriter of Knowledge:  Fair   Intelligence:  Average   Abstraction:  Concrete   Judgement:  Fair   Reality Testing:  Distorted    Insight:  Fair   Decision Making:  Paralyzed   Social Functioning  Social Maturity:  Isolates   Social Judgement:  Heedless; Chief Of Staff   Stress  Stressors:  Other (Comment)   Coping Ability:  Exhausted   Skill Deficits:  None   Supports:  Family; Friends/Service system     Religion: Religion/Spirituality Are You A Religious Person?: No  Leisure/Recreation: Leisure / Recreation Do You Have Hobbies?: No  Exercise/Diet: Exercise/Diet Do You Exercise?: No Have You Gained or Lost A Significant Amount of Weight in the Past Six Months?: No Do You Follow a Special Diet?: No Do You Have Any Trouble Sleeping?: Yes   CCA Employment/Education Employment/Work Situation: Employment / Work Situation Employment Situation: Unemployed Has Patient ever Been in Equities Trader?: No  Education: Education Is Patient Currently Attending School?: No Did You Have An Individualized Education Program (IIEP): No Did You Have Any Difficulty At [employer]?: No Patient's Education Has Been Impacted by Current Illness: No   CCA Family/Childhood History Family and Relationship History: Family history  Marital status: Long term relationship Does patient have children?: Yes  Childhood History:  Childhood History By whom was/is the patient raised?: Father Did patient suffer any verbal/emotional/physical/sexual abuse as a child?: No Did patient suffer from severe childhood neglect?: No Has patient ever been sexually abused/assaulted/raped as an adolescent or adult?: No Was the patient ever a victim of a crime or a disaster?: No Witnessed domestic violence?: No Has patient been affected by domestic violence as an adult?: No   CCA Substance Use Alcohol/Drug Use: Alcohol / Drug Use Pain Medications: See MAR Prescriptions: See MAR Over the Counter: See MAR History of alcohol / drug use?: No history of alcohol / drug abuse Longest period of sobriety (when/how long): n/a   ASAM's:   Six Dimensions of Multidimensional Assessment  Dimension 1:  Acute Intoxication and/or Withdrawal Potential:      Dimension 2:  Biomedical Conditions and Complications:      Dimension 3:  Emotional, Behavioral, or Cognitive Conditions and Complications:     Dimension 4:  Readiness to Change:     Dimension 5:  Relapse, Continued use, or Continued Problem Potential:     Dimension 6:  Recovery/Living Environment:     ASAM Severity Score:    ASAM Recommended Level of Treatment:     Substance use Disorder (SUD)    Recommendations for Services/Supports/Treatments:    Disposition Recommendation per psychiatric provider: Inpatient Treatment   DSM5 Diagnoses: Patient Active Problem List   Diagnosis Date Noted   Acute psychosis (HCC) 01/22/2023   Paranoia (psychosis) (HCC) 07/31/2022   Suicidal ideation 07/31/2022   Opiate use 03/17/2022   Schizoaffective disorder (HCC) 03/16/2022   Prolonged QT interval 12/25/2021   Schizoaffective disorder, bipolar type (HCC) 12/24/2021   Migraine 12/24/2021   Status epilepticus (HCC) 12/24/2021   Nonverbal 10/03/2021   Depression 10/02/2021   Seizures (HCC) 11/16/2018   Intellectual disability 03/24/2016   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   Substance-induced delirium (HCC) 03/02/2016   Altered mental status 03/01/2016   Seizure (HCC) 03/01/2016   Hypokalemia 03/01/2016   Leukocytosis 03/01/2016   Shoulder pain, right 10/25/2015   Opiate abuse, episodic (HCC) 10/22/2015    Referrals to Alternative Service(s): Referred to Alternative Service(s):   Place:   Date:   Time:    Referred to Alternative Service(s):   Place:   Date:   Time:    Referred to Alternative Service(s):   Place:   Date:   Time:    Referred to Alternative Service(s):   Place:   Date:   Time:     Kiki DOROTHA Barge MS, LCAS, [facility], [facility] Therapeutic Triage Specialist 06/08/2024 3:51 PM
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
## 2024-06-08 NOTE — Group Note (Signed)
 Date:  06/08/2024 Time:  9:55 PM  Group Topic/Focus:  Wrap-Up Group:   The focus of this group is to help patients review their daily goal of treatment and discuss progress on daily workbooks.    Additional Comments:  Didn't Attend  Kerri Katz 06/08/2024, 9:55 PM

## 2024-06-08 NOTE — ED Notes (Signed)
Patient received lunch tray 

## 2024-06-09 ENCOUNTER — Encounter: Payer: Self-pay | Admitting: Child & Adolescent Psychiatry

## 2024-06-09 DIAGNOSIS — F319 Bipolar disorder, unspecified: Secondary | ICD-10-CM | POA: Diagnosis not present
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
## 2024-06-09 NOTE — Group Note (Signed)
 Date:  06/09/2024 Time:  5:42 PM  Group Topic/Focus:  Overcoming Stress:   The focus of this group is to define stress and help patients assess their triggers.    Participation Level:  Did Not Attend   Camellia HERO Keyontay Stolz 06/09/2024, 5:42 PM

## 2024-06-09 NOTE — Progress Notes (Signed)
°   06/09/24 0918  Psych Admission Type (Psych Patients Only)  Admission Status Voluntary  Psychosocial Assessment  Patient Complaints Anxiety;Depression  Eye Contact Fair  Facial Expression Anxious  Affect Anxious;Preoccupied  Speech Soft  Interaction Childlike  Motor Activity Slow  Appearance/Hygiene In scrubs  Behavior Characteristics Cooperative;Anxious  Mood Preoccupied;Anxious  Aggressive Behavior  Effect No apparent injury  Thought Process  Coherency Disorganized  Content Preoccupation  Delusions Paranoid  Hallucination None reported or observed  Judgment Impaired  Confusion Mild  Danger to Self  Current suicidal ideation? Denies  Danger to Others  Danger to Others None reported or observed

## 2024-06-09 NOTE — [facility] Suicide Risk Assessment (Addendum)
 [facility] INPATIENT:  Family/Significant Other Suicide Prevention Education  Suicide Prevention Education:  Education Completed; [name], [phone], Father, has been identified by the patient as the family member/significant other with whom the patient will be residing, and identified as the person(s) who will aid the patient in the event of a mental health crisis (suicidal ideations/suicide attempt).  With written consent from the patient, the family member/significant other has been provided the following suicide prevention education, prior to the and/or following the discharge of the patient.  The suicide prevention education provided includes the following: Suicide risk factors Suicide prevention and interventions National Suicide Hotline telephone number [facility] assessment telephone number [facility] Emergency Assistance 911 [facility] and/or Residential Mobile Crisis Unit telephone number  Request made of family/significant other to: Remove weapons (e.g., guns, rifles, knives), all items previously/currently identified as safety concern.   Remove drugs/medications (over-the-counter, prescriptions, illicit drugs), all items previously/currently identified as a safety concern.  The family member/significant other verbalizes understanding of the suicide prevention education information provided.  The family member/significant other agrees to remove the items of safety concern listed above.  According to father, the patient has been going without sleep and talking out of her head. Father confirmed that the patient does reside with her 41 year old daughter and boyfriend, Mr. Signa. Father reported that the patient has an extensive history of seizures which he contributed to that patient's use of pain pills. Father reported that he is unsure if there are any weapons in the home at this present time. Father reported no further safety concerns.   [name] 06/09/2024, 10:41 AM

## 2024-06-09 NOTE — Plan of Care (Signed)
°  Problem: Education: Goal: Emotional status will improve Outcome: Progressing Goal: Verbalization of understanding the information provided will improve Outcome: Progressing   Problem: Activity: Goal: Interest or engagement in activities will improve Outcome: Progressing Goal: Sleeping patterns will improve Outcome: Progressing   Problem: Coping: Goal: Ability to demonstrate self-control will improve Outcome: Progressing   Problem: Health Behavior/Discharge Planning: Goal: Compliance with treatment plan for underlying cause of condition will improve Outcome: Progressing   Problem: Education: Goal: Mental status will improve Outcome: Not Progressing   Problem: Coping: Goal: Ability to verbalize frustrations and anger appropriately will improve Outcome: Not Progressing

## 2024-06-09 NOTE — Tx Team (Signed)
 Initial Treatment Plan 06/09/2024 6:16 AM [name] FMW:[id number]    PATIENT STRESSORS: Medication change or noncompliance     PATIENT STRENGTHS: Average or above average intelligence  Communication skills    PATIENT IDENTIFIED PROBLEMS: Psychosis                     DISCHARGE CRITERIA:  Reduction of life-threatening or endangering symptoms to within safe limits  PRELIMINARY DISCHARGE PLAN: Return to previous living arrangement  PATIENT/FAMILY INVOLVEMENT: This treatment plan has been presented to and reviewed with the patient, [name], and/or family member,  .  The patient and family have been given the opportunity to ask questions and make suggestions.  Odella DELENA Burrs, RN 06/09/2024, 6:16 AM

## 2024-06-09 NOTE — Progress Notes (Signed)
 Patient admitted under voluntary committment from the ED for medication stabilization. Pt appears to be tangential and disorganized in her thought process and fixated on past events. She was oriented to time, date, and situation. She is cooperative and needs a lot of prompting to stay on task. Pt appears to have little insight into current hospitalization.

## 2024-06-09 NOTE — [facility] Counselor (Signed)
 CSW conducted suicide prevention education with patient's written consent with her father, [name] at [phone]. According to father, the patient has been going without sleep and talking out of her head. Father confirmed that the patient does reside with her 41 year old daughter and boyfriend, Mr. Signa. Father reported that the patient has an extensive history of seizures which he contributed to that patient's use of pain pills. Father reported that he is unsure if there are any weapons in the home at this present time. Father reported no further safety concerns.   Kristain Hu, MSW, LCSWA 06/09/2024 10:43 AM
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
## 2024-06-09 NOTE — [facility] Suicide Risk Assessment (Signed)
 [facility] Admission Suicide Risk Assessment   Nursing information obtained from:    Demographic factors:  Caucasian Current Mental Status:  Suicidal ideation indicated by patient Loss Factors:  NA Historical Factors:  Prior suicide attempts Risk Reduction Factors:  Sense of responsibility to family  Total Time spent with patient: 30 minutes Principal Problem: Bipolar disorder (HCC) Diagnosis:  Principal Problem:   Bipolar disorder (HCC)  Subjective Data: ***  Continued Clinical Symptoms:  Alcohol Use Disorder Identification Test Final Score (AUDIT): 0 The Alcohol Use Disorders Identification Test, Guidelines for Use in Primary Care, Second Edition.  World Science Writer [facility]). Score between 0-7:  no or low risk or alcohol related problems. Score between 8-15:  moderate risk of alcohol related problems. Score between 16-19:  high risk of alcohol related problems. Score 20 or above:  warrants further diagnostic evaluation for alcohol dependence and treatment.   CLINICAL FACTORS:   {Clinical Factors:22706}   Musculoskeletal: Strength & Muscle Tone: {desc; muscle tone:32375} Gait & Station: {PE GAIT ED WJUO:77474} Patient leans: {Patient Leans:21022755}  Psychiatric Specialty Exam:  Presentation  General Appearance:  Bizarre; Disheveled  Eye Contact: Poor  Speech: Blocked  Speech Volume: Decreased  Handedness:No data recorded  Mood and Affect  Mood: -- (fine)  Affect: Flat; Inappropriate   Thought Process  Thought Processes: Other (comment) (unable to assess)  Descriptions of Associations:-- (unable to assess)  Orientation:Other (comment) (unable to assess)  Thought Content:Other (comment) (unable to assess)  History of Schizophrenia/Schizoaffective disorder:No  Duration of Psychotic Symptoms:No data recorded Hallucinations:Hallucinations: None  Ideas of Reference:Other (comment) (unable to assess)  Suicidal Thoughts:Suicidal Thoughts:  No  Homicidal Thoughts:Homicidal Thoughts: No   Sensorium  Memory: Other (comment) (unable to assess)  Judgment: Impaired  Insight: None   Executive Functions  Concentration: Poor  Attention Span: Poor  Recall: -- (unable to assess)  Fund of Knowledge: Other (comment) (unable to assess)  Language: Other (comment) (unable to assess)   Psychomotor Activity  Psychomotor Activity: Psychomotor Activity: Normal   Assets  Assets: Other (comment) (unable to assess)   Sleep  Sleep: Sleep: -- (unable to assess)    Physical Exam: Physical Exam ROS Blood pressure (!) 119/93, pulse (!) 101, temperature 97.9 F (36.6 C), temperature source Oral, resp. rate 16, height 5' 1 (1.549 m), weight 64 kg, last menstrual period 06/05/2024, SpO2 99%. Body mass index is 26.67 kg/m.   COGNITIVE FEATURES THAT CONTRIBUTE TO RISK:  {Cognitive Features:304700251}    SUICIDE RISK:   {[facility] SUICIDE RISK:22704}  PLAN OF CARE: ***  I certify that inpatient services furnished can reasonably be expected to improve the patient's condition.   Adrienne Arcos, NP 06/09/2024, 11:41 PM

## 2024-06-09 NOTE — Group Note (Signed)
 Date:  06/09/2024 Time:  5:38 PM  Group Topic/Focus:  Emotional Education:   The focus of this group is to discuss what feelings/emotions are, and how they are experienced.    Participation Level:  Active  Participation Quality:  Appropriate  Affect:  Appropriate  Cognitive:  Appropriate  Insight: Appropriate  Engagement in Group:  Engaged  Modes of Intervention:  Activity  Additional Comments:    [name] 06/09/2024, 5:38 PM

## 2024-06-09 NOTE — Plan of Care (Signed)
 Pt is newly admitted and need stabilization of psychiatric symptoms.

## 2024-06-10 DIAGNOSIS — R569 Unspecified convulsions: Secondary | ICD-10-CM

## 2024-06-10 DIAGNOSIS — F29 Unspecified psychosis not due to a substance or known physiological condition: Secondary | ICD-10-CM | POA: Diagnosis not present

## 2024-06-10 DIAGNOSIS — G4700 Insomnia, unspecified: Secondary | ICD-10-CM | POA: Diagnosis not present

## 2024-06-10 MED ORDER — OLANZAPINE 5 MG PO TBDP
5.0000 mg | ORAL_TABLET | Freq: Two times a day (BID) | ORAL | Status: DC
  Administered 2024-06-10 – 2024-06-14 (×9): 5 mg via ORAL
  Filled 2024-06-10 (×9): qty 1

## 2024-06-10 MED ORDER — CARBAMAZEPINE 200 MG PO TABS
200.0000 mg | ORAL_TABLET | Freq: Two times a day (BID) | ORAL | Status: DC
  Administered 2024-06-10 – 2024-06-18 (×16): 200 mg via ORAL
  Filled 2024-06-10 (×17): qty 1

## 2024-06-10 NOTE — Progress Notes (Signed)
°   06/10/24 0624  Psych Admission Type (Psych Patients Only)  Admission Status Voluntary  Psychosocial Assessment  Patient Complaints Anxiety  Eye Contact Brief  Facial Expression Anxious  Affect Anxious  Speech Soft;Logical/coherent  Interaction Childlike  Motor Activity Slow  Appearance/Hygiene Improved  Behavior Characteristics Appropriate to situation;Cooperative  Mood Preoccupied  Thought Process  Coherency Disorganized  Content Preoccupation  Delusions Paranoid  Perception WDL  Hallucination None reported or observed  Judgment WDL  Confusion WDL  Danger to Self  Current suicidal ideation? Denies  Danger to Others  Danger to Others None reported or observed   No distress noted, 15 minutes safety checks maintained will continue to monitor.

## 2024-06-10 NOTE — Group Note (Signed)
 Date:  06/10/2024 Time:  10:33 AM  Group Topic/Focus:  Goals Group:   The focus of this group is to help patients establish daily goals to achieve during treatment and discuss how the patient can incorporate goal setting into their daily lives to aide in recovery.    Participation Level:  Did Not Attend   Skippy LITTIE Bennett 06/10/2024, 10:33 AM

## 2024-06-10 NOTE — BH IP Treatment Plan (Signed)
 Interdisciplinary Treatment and Diagnostic Plan Update  06/10/2024 Time of Session: 10:20 [name] MRN: [id number]  Principal Diagnosis: Bipolar disorder [facility])  Secondary Diagnoses: Principal Problem:   Bipolar disorder (HCC)   Current Medications:  Current Facility-Administered Medications  Medication Dose Route Frequency Provider Last Rate Last Admin   acetaminophen  (TYLENOL ) tablet 650 mg  650 mg Oral Q6H PRN Hampton, Tracie B, NP   650 mg at 06/09/24 1721   alum & mag hydroxide-simeth (MAALOX/MYLANTA) 200-200-20 MG/5ML suspension 30 mL  30 mL Oral Q4H PRN Hampton, Tracie B, NP       hydrOXYzine  (ATARAX ) tablet 25 mg  25 mg Oral TID PRN Hampton, Tracie B, NP       hydrOXYzine  (ATARAX ) tablet 50 mg  50 mg Oral TID PRN Hampton, Tracie B, NP       magnesium  hydroxide (MILK OF MAGNESIA) suspension 30 mL  30 mL Oral Daily PRN Hampton, Tracie B, NP       nicotine  (NICODERM CQ  - dosed in mg/24 hours) patch 21 mg  21 mg Transdermal Daily Hampton, Tracie B, NP   21 mg at 06/10/24 9240   OLANZapine  (ZYPREXA ) injection 5 mg  5 mg Intramuscular TID PRN Hampton, Tracie B, NP       OLANZapine  zydis (ZYPREXA ) disintegrating tablet 5 mg  5 mg Oral TID PRN Hampton, Tracie B, NP       PARoxetine  (PAXIL ) tablet 20 mg  20 mg Oral Daily Hampton, Tracie B, NP   20 mg at 06/10/24 0758   QUEtiapine  (SEROQUEL ) tablet 100 mg  100 mg Oral QHS Hampton, Tracie B, NP   100 mg at 06/09/24 2105   traZODone  (DESYREL ) tablet 50 mg  50 mg Oral QHS PRN Hampton, Tracie B, NP   50 mg at 06/08/24 2144   PTA Medications: Medications Prior to Admission  Medication Sig Dispense Refill Last Dose/Taking   hydrOXYzine  (ATARAX ) 25 MG tablet Take 1 tablet (25 mg total) by mouth 3 (three) times daily as needed for anxiety (Sleep). (Patient not taking: Reported on 02/24/2023) 30 tablet 0    lacosamide  (VIMPAT ) 50 MG TABS tablet Take 1 tablet (50 mg total) by mouth 2 (two) times daily. (Patient not taking: Reported on  12/21/2022) 60 tablet 0    levETIRAcetam  (KEPPRA ) 1000 MG tablet Take 1,000 mg by mouth 2 (two) times daily. (Patient not taking: Reported on 02/24/2023)      nicotine  (NICODERM CQ  - DOSED IN MG/24 HOURS) 21 mg/24hr patch Place 1 patch (21 mg total) onto the skin daily. (Patient not taking: Reported on 06/08/2024) 28 patch 0    PARoxetine  (PAXIL ) 20 MG tablet Take 20 mg by mouth daily. (Patient not taking: Reported on 06/08/2024)      traZODone  (DESYREL ) 50 MG tablet Take 1 tablet (50 mg total) by mouth at bedtime as needed for sleep. (Patient not taking: Reported on 06/08/2024) 30 tablet 0     Patient Stressors: Medication change or noncompliance    Patient Strengths: Average or above average intelligence  Communication skills   Treatment Modalities: Medication Management, Group therapy, Case management,  1 to 1 session with clinician, Psychoeducation, Recreational therapy.   Physician Treatment Plan for Primary Diagnosis: Bipolar disorder (HCC) Long Term Goal(s):     Short Term Goals:    Medication Management: Evaluate patient's response, side effects, and tolerance of medication regimen.  Therapeutic Interventions: 1 to 1 sessions, Unit Group sessions and Medication administration.  Evaluation of Outcomes: Not Met  Physician  Treatment Plan for Secondary Diagnosis: Principal Problem:   Bipolar disorder (HCC)  Long Term Goal(s):     Short Term Goals:       Medication Management: Evaluate patient's response, side effects, and tolerance of medication regimen.  Therapeutic Interventions: 1 to 1 sessions, Unit Group sessions and Medication administration.  Evaluation of Outcomes: Not Met   RN Treatment Plan for Primary Diagnosis: Bipolar disorder (HCC) Long Term Goal(s): Knowledge of disease and therapeutic regimen to maintain health will improve  Short Term Goals: Ability to remain free from injury will improve, Ability to verbalize frustration and anger appropriately will  improve, Ability to demonstrate self-control, Ability to participate in decision making will improve, Ability to verbalize feelings will improve, Ability to disclose and discuss suicidal ideas, Ability to identify and develop effective coping behaviors will improve, and Compliance with prescribed medications will improve  Medication Management: RN will administer medications as ordered by provider, will assess and evaluate patient's response and provide education to patient for prescribed medication. RN will report any adverse and/or side effects to prescribing provider.  Therapeutic Interventions: 1 on 1 counseling sessions, Psychoeducation, Medication administration, Evaluate responses to treatment, Monitor vital signs and CBGs as ordered, Perform/monitor CIWA, COWS, AIMS and Fall Risk screenings as ordered, Perform wound care treatments as ordered.  Evaluation of Outcomes: Not Met   LCSW Treatment Plan for Primary Diagnosis: Bipolar disorder [facility]) Long Term Goal(s): Safe transition to appropriate next level of care at discharge, Engage patient in therapeutic group addressing interpersonal concerns.  Short Term Goals: Engage patient in aftercare planning with referrals and resources, Increase social support, Increase ability to appropriately verbalize feelings, Increase emotional regulation, Facilitate acceptance of mental health diagnosis and concerns, Facilitate patient progression through stages of change regarding substance use diagnoses and concerns, Identify triggers associated with mental health/substance abuse issues, and Increase skills for wellness and recovery  Therapeutic Interventions: Assess for all discharge needs, 1 to 1 time with Social worker, Explore available resources and support systems, Assess for adequacy in community support network, Educate family and significant other(s) on suicide prevention, Complete Psychosocial Assessment, Interpersonal group therapy.  Evaluation of  Outcomes: Not Met   Progress in Treatment: Attending groups: No. Participating in groups: No. Taking medication as prescribed: Yes. Toleration medication: Yes. Family/Significant other contact made: Yes, individual(s) contacted:  father, [name].  Patient understands diagnosis: No. Discussing patient identified problems/goals with staff: No. Medical problems stabilized or resolved: Yes. Denies suicidal/homicidal ideation: No. Issues/concerns per patient self-inventory: No. Other: none.  New problem(s) identified: No, Describe:  none identified.   New Short Term/Long Term Goal(s): elimination of symptoms of psychosis, medication management for mood stabilization; elimination of SI thoughts; development of comprehensive mental wellness/sobriety plan.  Patient Goals:  Patient was unable to identify goal for treatment.   Discharge Plan or Barriers: CSW will assist pt with development of an appropriate aftercare/discharge plan.   Reason for Continuation of Hospitalization: Medication stabilization Suicidal ideation Other; describe insomnia  Estimated Length of Stay: 1-7 days  Last 3 [place] Suicide Severity Risk Score: Flowsheet Row Admission (Current) from 06/08/2024 in [facility] Most recent reading at 06/09/2024  5:48 AM ED from 06/08/2024 in [facility] Most recent reading at 06/08/2024  2:59 PM ED from 01/09/2024 in [facility] Emergency Department at [facility] Most recent reading at 01/09/2024  2:47 PM  C-SSRS RISK CATEGORY Low Risk Low Risk No Risk    Last PHQ 2/9 Scores:  No data to display          Scribe for Treatment Team: Nadara JONELLE Fam, KEN 06/10/2024 10:47 AM

## 2024-06-10 NOTE — Group Note (Signed)
 [facility] LCSW Group Therapy Note    Group Date: 06/10/2024 Start Time: 1300 End Time: 1400  Type of Therapy and Topic:  Group Therapy:  Overcoming Obstacles  Participation Level:  [facility] PARTICIPATION LEVEL: Did Not Attend  Description of Group:   In this group patients will be encouraged to explore what they see as obstacles to their own wellness and recovery. They will be guided to discuss their thoughts, feelings, and behaviors related to these obstacles. The group will process together ways to cope with barriers, with attention given to specific choices patients can make. Each patient will be challenged to identify changes they are motivated to make in order to overcome their obstacles. This group will be process-oriented, with patients participating in exploration of their own experiences as well as giving and receiving support and challenge from other group members.  Therapeutic Goals: 1. Patient will identify personal and current obstacles as they relate to admission. 2. Patient will identify barriers that currently interfere with their wellness or overcoming obstacles.  3. Patient will identify feelings, thought process and behaviors related to these barriers. 4. Patient will identify two changes they are willing to make to overcome these obstacles:    Summary of Patient Progress Patient did not attend group.   Therapeutic Modalities:   Cognitive Behavioral Therapy Solution Focused Therapy Motivational Interviewing Relapse Prevention Therapy   Nadara JONELLE Fam, LCSW

## 2024-06-10 NOTE — Consult Note (Signed)
 NEUROLOGY CONSULT NOTE   Date of service: June 10, 2024 Patient Name: [name] MRN:  [id number] DOB:  1981-01-08 Chief Complaint: AED mgmt Requesting Provider: Ruther Millie SAUNDERS, MD  History of Present Illness   [name] is a 43 y.o. female with hx of bipolar disorder with psychotic features who presented to ED with psychosis and insomnia. She is currently psychotic and has been voluntarily committed. She also has a history of seizures, prescribed keppra , unclear compliance (has told various providers that she was taking it up until admission or has not taken it in months). Neurology is consulted for AED recommendations.  Patient is unable to provide coherent history. She does state that she was taking keppra  until admission and thought it made all the things in her head and ears worse but is unable to clarify further. When asked about her seizure history she states that people tell her she has seizures but seizures are not the problem it's inside my head and my ears. She is not sure if she has tried other seizure medicines in the past but is able to state that she has never heard of or been on carbamazepine .  We previously saw patient in 2023 and recommended keppra  at that time. Depakote was considered but not recommended given high risk of teratogenicity in a woman of child-bearing age. Per Dr. Peggi consult note 12/24/21:  Seizures reportedly started in March 2014 or 2013, with a prodrome of headache/feeling fuzzy prior to events followed by generalized shaking and loss of consciousness with 15 to 30 minutes of postevent confusion.  Records also describe some spells of nonsensical speech and other spells of upper extremity flexion.  She has provided variable history to different providers at different times per chart review.  She has certainly had withdrawal seizures in the setting of missing Klonopin  doses. 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   ROS   Unable to ascertain due to mental status  Past History   Past Medical History:  Diagnosis Date   Degeneration of lumbar intervertebral disc    Depression    DVT (deep vein thrombosis) in pregnancy    Schizoaffective disorder (HCC)    Seizures (HCC)    Shoulder pain, right     Past Surgical History:  Procedure Laterality Date   CESAREAN SECTION     X2    Family History: Family History  Problem Relation Age of Onset   Cancer Mother     Social History  reports that she has been smoking cigarettes. She has never used smokeless tobacco. She reports current drug use. Drug: Marijuana. She reports that she does not drink alcohol.  Allergies[1]  Medications  Current Medications[2]  Vitals   Vitals:   06/09/24 0500 06/09/24 0647 06/09/24 1606 06/10/24 0643  BP:  120/87 (!) 119/93 125/86  Pulse:  (!) 112 (!) 101 84  Resp:  16  16  Temp:  97.9 F (36.6 C)  97.8 F (36.6 C)  TempSrc:  Oral  Oral  SpO2:  99% 99% 100%  Weight: 64 kg     Height: 5' 1 (1.549 m)       Body mass index is 26.67 kg/m.   Physical Exam   Gen: patient lying in bed, NAD CV: extremities appear well-perfused Resp: normal WOB  Neurologic exam MS: alert, oriented to self, hospital and year, follows commands Speech: no dysarthria,  no aphasia, speech is slow CN: PERRL, VFF, EOMI, sensation intact, face symmetric, hearing intact to voice Motor: 5/5 strength throughout Sensory: SILT Reflexes: 2+ symm with toes down bilat Coordination: FNF intact bilat Gait: deferred  Psych: depressed affect, disorganized thought process  Labs/Imaging/Neurodiagnostic studies   CBC:  Recent Labs  Lab 2024/06/21 1013  WBC 7.9  HGB 15.0  HCT 44.2  MCV 93.4  PLT 289   Basic Metabolic Panel:  Lab Results  Component Value Date   NA 141 06/21/24   K 3.8 06/21/24   CO2 27 2024/06/21    GLUCOSE 116 (H) 21-Jun-2024   BUN 6 06/21/2024   CREATININE 0.58 06-21-2024   CALCIUM 9.2 06-21-2024   GFRNONAA >60 June 21, 2024   GFRAA >60 02/01/2020   Lipid Panel:  Lab Results  Component Value Date   LDLCALC 101 (H) 08/02/2022   HgbA1c:  Lab Results  Component Value Date   HGBA1C 5.2 08/02/2022   Urine Drug Screen:     Component Value Date/Time   LABOPIA NEGATIVE 2024-06-21 1619   COCAINSCRNUR NEGATIVE 2024-06-21 1619   COCAINSCRNUR NONE DETECTED 01/22/2023 1816   LABBENZ NEGATIVE 21-Jun-2024 1619   AMPHETMU NEGATIVE 2024-06-21 1619   THCU POSITIVE (A) 06-21-24 1619   LABBARB NEGATIVE 21-Jun-2024 1619    Alcohol Level     Component Value Date/Time   ETH <15 2024/06/21 1013   INR  Lab Results  Component Value Date   INR 1.0 10/03/2021   APTT  Lab Results  Component Value Date   APTT 31 04/22/2016   AED levels:  Lab Results  Component Value Date   PHENYTOIN  <2.5 (L) 02/29/2016   LEVETIRACETA 42.6 (H) 10/18/2023    No CNS imaging this admission  Labs this admission: POC urine pregnancy test neg UDS (+) THC otherwise neg CBC WNL CMP WNL  ASSESSMENT & RECOMMENDATIONS   [name] is a 43 y.o. female with hx of bipolar disorder with psychotic features who presented to ED with psychosis and insomnia. She is currently psychotic and has been voluntarily committed. She also has a history of seizures, prescribed keppra , unclear compliance. Per initial psych consult note she has not taken medications in months but patient reported today she was taking it up until admission. Unfortunately she cannot provide a coherent history today. Neurology is consulted for AED recommendations.  Ideally she would be started on an AED with mood-stabilizing effects of which there are 3: depakote, lamotrigine , and carbamazepine . We previously saw patient in 2023 and recommended keppra  at that time. I would not restart keppra  and would also avoid in the future given her  multiple presentations now with psychotic features (keppra  could exacerbate this and her underlying mood disorder). Depakote was considered but not prescribed 2/2 high risk of teratogenic effects. I would not prescribe depakote for this woman of child-bearing age particularly especially given volatile social situation and current landscape regarding elective pregnancy termination in the [place] . I do not think she is capable at this time of managing the cautious lamotrigine  uptitration required to reduce risk of SJS therefore she is not a good candidate for lamotrigine  at this time.   The other AED that does have mood-stabilizing effects is carbamazepine  which I think would be the best choice for her. Carbamazepine  dosing for seizures and mood stabilization are very similar. Patient was on seroquel  and paxil  until today when she was switched to scheduled olanzapine . D/w pharmacy, carbamazepine  is compatible with zyprexa  but may decrease serum olanzapine  levels (so just  titrate olanzapine  dosing to efficacy, as you otherwise would). Baseline labs required prior to starting carbamazepine  are WNL. Carbamazepine  does increase the risk of birth defects as do most AEDs other than keppra  and lamotrigine  but teratogenicity is lower than depakote. While overall carbamazepine  has a mood-stabilizing effect rarely it can increase SI and patient should be closely monitored for this. D/w Dr. Ruther this morning and he feels that patient actually does not have SI at this time and presentation is solely related to psychosis.  If carbamazepine  fails or is incompatible with further psychiatry medication recommendations would consider an alternative AED with mood-neutral effects such as vimpat .   - Start carbamazepine  200mg  bid. If she has side effects such as lethargy, dizziness, or nausea dose will decrease to 100mg  bid - I will put in a pharmacy consult for carbamazepine  so they can check a level in 3-5 days and adjust dose as  needed - She will require the following outpatient monitoring labs at 1 mo, 3 mos, and annually after that: carbamazepine  level, CBC, CMP. If she stays on this long-term will also need monitoring of thyroid  levels and vit D / bone density. - I am not concerned that she is having nonconvulsive seizures at this time therefore no indication for EEG - I will arrange outpatient neurology f/u for this - Will f/u tomorrow and see how she is tolerating the medication. Otherwise we will remain available as needed for any further recommendations on medication regimen.   Thank you for this consult. Please reach out with any questions or concerns.  Adrienne Ross, MD [facility] [phone]  If 7pm- 7am, please page neurology on call as listed in AMION.     [1]  Allergies Allergen Reactions   Latex Rash and Swelling    Other reaction(s): Unknown Skin turns red   Morphine Other (See Comments) and Swelling    Patient states that her reaction is that her skin gets a bit red.   No urticaria or airway difficulty.     Prednisone Other (See Comments)    Other reaction(s): Other (See Comments) Stayed up 30 days when pt. Was on prednisone insomnia   Buprenorphine      Other Reaction(s): Not available   Buprenorphine  Hcl-Naloxone  Hcl    Duloxetine Other (See Comments)    Unknown Unknown   Fluoxetine     Other Reaction(s): Not available   Ketorolac Tromethamine    Lithium     drool   Ropinirole     Other Reaction(s): Not available   Tramadol     Other reaction(s): Other (See Comments) Seizures   Poison Ivy Extract [Poison Ivy Extract] Rash   Poison Oak Extract [Poison Oak Extract] Rash   Sumac Rash  [2]  Current Facility-Administered Medications:    acetaminophen  (TYLENOL ) tablet 650 mg, 650 mg, Oral, Q6H PRN, [place], Tracie B, NP, 650 mg at 06/09/24 1721   alum & mag hydroxide-simeth (MAALOX/MYLANTA) 200-200-20 MG/5ML suspension 30 mL, 30 mL, Oral, Q4H PRN, [place], Tracie B,  NP   hydrOXYzine  (ATARAX ) tablet 25 mg, 25 mg, Oral, TID PRN, Hampton, Tracie B, NP   hydrOXYzine  (ATARAX ) tablet 50 mg, 50 mg, Oral, TID PRN, Hampton, Tracie B, NP   magnesium  hydroxide (MILK OF MAGNESIA) suspension 30 mL, 30 mL, Oral, Daily PRN, En[place], Tracie B, NP   nicotine  (NICODERM CQ  - dosed in mg/24 hours) patch 21 mg, 21 mg, Transdermal, Daily, Hampton, Tracie B, NP, 21 mg at 06/10/24 0759   OLANZapine  (ZYPREXA ) injection 5  mg, 5 mg, Intramuscular, TID PRN, Hampton, Tracie B, NP   OLANZapine  zydis (ZYPREXA ) disintegrating tablet 5 mg, 5 mg, Oral, TID PRN, Hampton, Tracie B, NP   PARoxetine  (PAXIL ) tablet 20 mg, 20 mg, Oral, Daily, Hampton, Tracie B, NP, 20 mg at 06/10/24 9241   QUEtiapine  (SEROQUEL ) tablet 100 mg, 100 mg, Oral, QHS, Hampton, Tracie B, NP, 100 mg at 06/09/24 2105   traZODone  (DESYREL ) tablet 50 mg, 50 mg, Oral, QHS PRN, Hampton, Tracie B, NP, 50 mg at 06/08/24 2144

## 2024-06-10 NOTE — Group Note (Signed)
 Recreation Therapy Group Note   Group Topic:Health and Wellness  Group Date: 06/10/2024 Start Time: 1530 End Time: 1610 Facilitators: Celestia Jeoffrey BRAVO, LRT, CTRS Location: Back Dayroom  Group Description: Light Exercise & Stretching. LRT discussed the mental and physical benefits of exercise. LRT and group discussed how physical activity can be used as a coping skill. LRT lead patients in a light exercise and stretching routine, with music being played in the background. Pt's encouraged to listen to their bodies and stop at any time if they experience feelings of discomfort or pain. Pts were encouraged to drink water and stay hydrated.   Goal Area(s) Addressed:  Patient will learn benefits of physical activity. Patient will identify exercise as a coping skill.  Patient will follow multistep directions. Patient will try a new leisure interest.   Affect/Mood: Appropriate   Participation Level: Minimal    Clinical Observations/Individualized Feedback: [name] came late to group. Pt was present for less than half the session.   Plan: Continue to engage patient in RT group sessions 2-3x/week.   Jeoffrey BRAVO Celestia, LRT, CTRS 06/10/2024 5:10 PM

## 2024-06-10 NOTE — Group Note (Signed)
 Recreation Therapy Group Note   Group Topic:Coping Skills  Group Date: 06/10/2024 Start Time: 1035 End Time: 1125 Facilitators: Celestia Jeoffrey BRAVO, LRT, CTRS Location: Craft Room  Group Description: Mind Map.  Patient was provided a blank template of a diagram with 32 blank boxes in a tiered system, branching from the center (similar to a bubble chart). LRT directed patients to label the middle of the diagram Coping Skills. LRT and patients then came up with 8 different coping skills as examples. Pt were directed to record their coping skills in the 2nd tier boxes closest to the center.  Patients would then share their coping skills with the group as LRT wrote them out. LRT gave a handout of 99 different coping skills at the end of group.   Goal Area(s) Addressed: Patients will be able to define coping skills. Patient will identify new coping skills.  Patient will increase communication.   Affect/Mood: N/A   Participation Level: Did not attend    Clinical Observations/Individualized Feedback: Patient did not attend group.   Plan: Continue to engage patient in RT group sessions 2-3x/week.   Jeoffrey BRAVO Celestia, LRT, CTRS 06/10/2024 11:53 AM

## 2024-06-10 NOTE — Group Note (Signed)
 Date:  06/10/2024 Time:  9:32 PM  Group Topic/Focus:  Orientation:   The focus of this group is to educate the patient on the purpose and policies of crisis stabilization and provide a format to answer questions about their admission.  The group details unit policies and expectations of patients while admitted. Wrap-Up Group:   The focus of this group is to help patients review their daily goal of treatment and discuss progress on daily workbooks.    Participation Level:  Active  Participation Quality:  Appropriate and Attentive  Affect:  Appropriate  Cognitive:  Alert and Appropriate  Insight: Appropriate and Good  Engagement in Group:  Engaged  Modes of Intervention:  Orientation  Additional Comments:     [name] 06/10/2024, 9:32 PM

## 2024-06-10 NOTE — Plan of Care (Signed)
   Problem: Education: Goal: Knowledge of [facility] Education information/materials will improve Outcome: Progressing   Problem: Education: Goal: Emotional status will improve Outcome: Progressing   Problem: Education: Goal: Mental status will improve Outcome: Progressing   Problem: Education: Goal: Verbalization of understanding the information provided will improve Outcome: Progressing

## 2024-06-10 NOTE — Progress Notes (Signed)
°   06/10/24 0902  Psych Admission Type (Psych Patients Only)  Admission Status Voluntary  Psychosocial Assessment  Patient Complaints Anxiety;Depression  Eye Contact Fair  Facial Expression Anxious  Affect Anxious;Preoccupied  Speech Soft  Interaction Childlike  Motor Activity Slow  Appearance/Hygiene In scrubs  Behavior Characteristics Anxious;Cooperative  Mood Preoccupied;Anxious;Sad  Aggressive Behavior  Effect No apparent injury  Thought Process  Coherency Disorganized  Content Preoccupation  Delusions Paranoid  Perception WDL  Hallucination None reported or observed  Judgment Impaired  Confusion Mild  Danger to Self  Current suicidal ideation? Denies  Danger to Others  Danger to Others None reported or observed
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
## 2024-06-10 NOTE — Plan of Care (Signed)
°  Problem: Activity: Goal: Interest or engagement in activities will improve Outcome: Progressing   Problem: Coping: Goal: Ability to demonstrate self-control will improve Outcome: Progressing   Problem: Health Behavior/Discharge Planning: Goal: Compliance with treatment plan for underlying cause of condition will improve Outcome: Progressing   Problem: Education: Goal: Emotional status will improve Outcome: Not Progressing Goal: Mental status will improve Outcome: Not Progressing Goal: Verbalization of understanding the information provided will improve Outcome: Not Progressing   Problem: Activity: Goal: Sleeping patterns will improve Outcome: Not Progressing   Problem: Coping: Goal: Ability to verbalize frustrations and anger appropriately will improve Outcome: Not Progressing

## 2024-06-10 NOTE — Progress Notes (Signed)
°   06/10/24 1145  Spiritual Encounters  Type of Visit Initial  Care provided to: Patient  Referral source Patient request  Reason for visit Routine spiritual support  OnCall Visit No  Interventions  Spiritual Care Interventions Made Established relationship of care and support;Compassionate presence;Reflective listening;Reconciliation with self/others;Narrative/life review;Prayer;Encouragement   Patient requested visit with Chaplain.  Provided compassionate presence and supportive listening.  Patient concerned about health and safety of daughters, and the need to get out of a very unhealthy home environment.  Patient voiced desire to get her daughters to safety and to be able to visit her grandson.  Patient stated a belief in God, patient and chaplain concluded the visit with prayer.

## 2024-06-11 DIAGNOSIS — F319 Bipolar disorder, unspecified: Secondary | ICD-10-CM | POA: Diagnosis not present

## 2024-06-11 LAB — LIPID PANEL
Cholesterol: 143 mg/dL (ref 0–200)
HDL: 48 mg/dL (ref >40)
LDL Cholesterol: 77 mg/dL (ref 0–99)
Total CHOL/HDL Ratio: 3 ratio
Triglycerides: 91 mg/dL (ref <150)
VLDL: 18 mg/dL (ref 0–40)

## 2024-06-11 LAB — TSH: TSH: 0.923 u[IU]/mL (ref 0.350–4.500)

## 2024-06-11 MED ORDER — SERTRALINE HCL 25 MG PO TABS
50.0000 mg | ORAL_TABLET | Freq: Every day | ORAL | Status: DC
  Administered 2024-06-11: 16:00:00 50 mg via ORAL
  Filled 2024-06-11: qty 2

## 2024-06-11 MED ORDER — CLONAZEPAM 0.5 MG PO TABS
0.5000 mg | ORAL_TABLET | Freq: Two times a day (BID) | ORAL | Status: DC
  Administered 2024-06-11 – 2024-06-14 (×7): 0.5 mg via ORAL
  Filled 2024-06-11 (×7): qty 1

## 2024-06-11 MED ORDER — SERTRALINE HCL 25 MG PO TABS
50.0000 mg | ORAL_TABLET | Freq: Every day | ORAL | Status: DC
  Administered 2024-06-12 – 2024-06-18 (×7): 50 mg via ORAL
  Filled 2024-06-11 (×7): qty 2

## 2024-06-11 NOTE — Plan of Care (Signed)
 [name] is a 43 y.o. female patient. 1. Seizure (HCC)    Past Medical History:  Diagnosis Date   Degeneration of lumbar intervertebral disc    Depression    DVT (deep vein thrombosis) in pregnancy    Schizoaffective disorder (HCC)    Seizures (HCC)    Shoulder pain, right    Current Facility-Administered Medications  Medication Dose Route Frequency Provider Last Rate Last Admin   acetaminophen  (TYLENOL ) tablet 650 mg  650 mg Oral Q6H PRN Hampton, Tracie B, NP   650 mg at 06/09/24 1721   alum & mag hydroxide-simeth (MAALOX/MYLANTA) 200-200-20 MG/5ML suspension 30 mL  30 mL Oral Q4H PRN Hampton, Tracie B, NP       carbamazepine  (TEGRETOL ) tablet 200 mg  200 mg Oral BID Stack, Colleen M, MD   200 mg at 06/10/24 1723   hydrOXYzine  (ATARAX ) tablet 25 mg  25 mg Oral TID PRN Hampton, Tracie B, NP       hydrOXYzine  (ATARAX ) tablet 50 mg  50 mg Oral TID PRN Hampton, Tracie B, NP       magnesium  hydroxide (MILK OF MAGNESIA) suspension 30 mL  30 mL Oral Daily PRN Hampton, Tracie B, NP       nicotine  (NICODERM CQ  - dosed in mg/24 hours) patch 21 mg  21 mg Transdermal Daily Hampton, Tracie B, NP   21 mg at 06/10/24 0759   OLANZapine  (ZYPREXA ) injection 5 mg  5 mg Intramuscular TID PRN Hampton, Tracie B, NP       OLANZapine  zydis (ZYPREXA ) disintegrating tablet 5 mg  5 mg Oral TID PRN Hampton, Tracie B, NP       OLANZapine  zydis (ZYPREXA ) disintegrating tablet 5 mg  5 mg Oral BID Madaram, Kondal R, MD   5 mg at 06/10/24 1631   traZODone  (DESYREL ) tablet 50 mg  50 mg Oral QHS PRN Hampton, Tracie B, NP   50 mg at 06/08/24 2144   Allergies[1] Principal Problem:   Bipolar disorder (HCC)  Blood pressure 107/83, pulse 83, temperature 97.8 F (36.6 C), temperature source Oral, resp. rate 18, height 5' 1 (1.549 m), weight 64 kg, last menstrual period 06/05/2024, SpO2 98%.    [name] [name] 06/11/2024      [1]  Allergies Allergen Reactions   Latex Rash and Swelling    Other  reaction(s): Unknown Skin turns red   Morphine Other (See Comments) and Swelling    Patient states that her reaction is that her skin gets a bit red.   No urticaria or airway difficulty.     Prednisone Other (See Comments)    Other reaction(s): Other (See Comments) Stayed up 30 days when pt. Was on prednisone insomnia   Buprenorphine      Other Reaction(s): Not available   Buprenorphine  Hcl-Naloxone  Hcl    Duloxetine Other (See Comments)    Unknown Unknown   Fluoxetine     Other Reaction(s): Not available   Ketorolac Tromethamine    Lithium     drool   Ropinirole     Other Reaction(s): Not available   Tramadol     Other reaction(s): Other (See Comments) Seizures   Poison Ivy Extract [Poison [employer] Extract] Rash   Poison Oak Extract [Poison Oak Extract] Rash   Sumac Rash

## 2024-06-11 NOTE — Plan of Care (Signed)
   Problem: Health Behavior/Discharge Planning: Goal: Identification of resources available to assist in meeting health care needs will improve Outcome: Progressing Goal: Compliance with treatment plan for underlying cause of condition will improve Outcome: Progressing   Problem: Physical Regulation: Goal: Ability to maintain clinical measurements within normal limits will improve Outcome: Progressing

## 2024-06-11 NOTE — Group Note (Signed)
 Date:  06/11/2024 Time:  11:15 AM  Group Topic/Focus:  Healthy Communication:   The focus of this group is to discuss communication, barriers to communication, as well as healthy ways to communicate with others.    Participation Level:  Active  Participation Quality:  Appropriate  Affect:  Appropriate  Cognitive:  Appropriate  Insight: Appropriate  Engagement in Group:  Engaged  Modes of Intervention:  Activity  Additional Comments:    Geraldine Sandberg 06/11/2024, 11:15 AM

## 2024-06-11 NOTE — Progress Notes (Signed)
°   06/11/24 0408  Psych Admission Type (Psych Patients Only)  Admission Status Voluntary  Psychosocial Assessment  Patient Complaints None  Eye Contact Fair  Facial Expression Anxious  Affect Anxious  Speech Soft  Interaction Childlike  Motor Activity Slow  Appearance/Hygiene In scrubs  Behavior Characteristics Anxious  Mood Sad;Anxious  Thought Process  Coherency Disorganized  Content Preoccupation  Delusions Paranoid  Perception WDL  Hallucination None reported or observed  Judgment Impaired  Confusion Mild  Danger to Self  Current suicidal ideation? Denies  Danger to Others  Danger to Others None reported or observed

## 2024-06-11 NOTE — Group Note (Signed)
 Date:  06/11/2024 Time:  8:45 PM  Group Topic/Focus:  Orientation:   The focus of this group is to educate the patient on the purpose and policies of crisis stabilization and provide a format to answer questions about their admission.  The group details unit policies and expectations of patients while admitted. Wrap-Up Group:   The focus of this group is to help patients review their daily goal of treatment and discuss progress on daily workbooks.    Participation Level:  Did Not Attend    Arlester CHRISTELLA Servant 06/11/2024, 8:45 PM

## 2024-06-11 NOTE — Group Note (Signed)
 Recreation Therapy Group Note   Group Topic:Healthy Support Systems  Group Date: 06/11/2024 Start Time: 1010 End Time: 1055 Facilitators: Celestia Jeoffrey BRAVO, LRT, CTRS Location: Craft Room  Group Description: Straw Bridge. In groups or individually, patients were given 10 plastic drinking straws and an equal length of masking tape. Using the materials provided, patients were instructed to build a free-standing bridge-like structure to suspend an everyday item (ex: deck of cards) off the floor or table surface. All materials were required to be used in secondary school teacher. LRT facilitated post-activity discussion reviewing the importance of having strong and healthy support systems in our lives. LRT discussed how the people in our lives serve as the tape and the deck of cards we placed on top of our straw structure are the stressors we face in daily life. LRT and pts discussed what happens in our life when things get too heavy for us , and we don't have strong supports outside of the hospital. Pt shared 2 of their healthy supports in their life aloud in the group.   Goal Area(s) Addressed:  Patient will identify 2 healthy supports in their life. Patient will identify skills to successfully complete activity. Patient will identify correlation of this activity to life post-discharge.  Patient will build on frustration tolerance skills. Patient will increase team building and communication skills.    Affect/Mood: Flat   Participation Level: Non-verbal    Clinical Observations/Individualized Feedback: [name] was present in group. Pt did not engage in activity or with peers.   Plan: Continue to engage patient in RT group sessions 2-3x/week.   Jeoffrey BRAVO Celestia, LRT, CTRS 06/11/2024 11:37 AM

## 2024-06-11 NOTE — Progress Notes (Incomplete)
Im

## 2024-06-11 NOTE — Group Note (Signed)
 LCSW Group Therapy Note   Group Date: 06/11/2024 Start Time: 1300 End Time: 1400   Type of Therapy and Topic:  Group Therapy: Challenging Core Beliefs  Participation Level:  Active  Description of Group:  Patients were educated about core beliefs and asked to identify one harmful core belief that they have. Patients were asked to explore from where those beliefs originate. Patients were asked to discuss how those beliefs make them feel and the resulting behaviors of those beliefs. They were then be asked if those beliefs are true and, if so, what evidence they have to support them. Lastly, group members were challenged to replace those negative core beliefs with helpful beliefs.   Therapeutic Goals:   1. Patient will identify harmful core beliefs and explore the origins of such beliefs. 2. Patient will identify feelings and behaviors that result from those core beliefs. 3. Patient will discuss whether such beliefs are true. 4.  Patient will replace harmful core beliefs with helpful ones.  Summary of Patient Progress:  Patient actively engaged in processing and exploring how core beliefs are formed and how they impact thoughts, feelings, and behaviors. Patient proved open to input from peers and feedback from CSW. Patient demonstrated proficient insight into the subject matter, was respectful and supportive of peers, and participated throughout the entire session.  Therapeutic Modalities: Cognitive Behavioral Therapy; Solution-Focused Therapy   [name] M Amyiah Gaba, LCSWA 06/11/2024  2:01 PM

## 2024-06-11 NOTE — Group Note (Signed)
 Recreation Therapy Group Note   Group Topic:Health and Wellness  Group Date: 06/11/2024 Start Time: 1515 End Time: 1600 Facilitators: Celestia Jeoffrey BRAVO, LRT, CTRS Location: Courtyard  Group Description: [employer]. LRT and patients played games of basketball, drew with chalk, and played corn hole while outside in the courtyard while getting fresh air and sunlight. Music was being played in the background. LRT and peers conversed about different games they have played before, what they do in their free time and anything else that is on their minds. LRT encouraged pts to drink water after being outside, sweating and getting their heart rate up.  Goal Area(s) Addressed: Patient will build on frustration tolerance skills. Patients will partake in a competitive play game with peers. Patients will gain knowledge of new leisure interest/hobby.    Affect/Mood: Blunted and Flat   Participation Level: Minimal    Clinical Observations/Individualized Feedback: [name] was present for half of group. Pt minimally interacted while present.   Plan: Continue to engage patient in RT group sessions 2-3x/week.   Jeoffrey BRAVO Celestia, LRT, CTRS 06/11/2024 4:55 PM

## 2024-06-11 NOTE — Progress Notes (Signed)
 [facility] MD Progress Note  06/11/2024 6:01 PM [name]  MRN:  [id number]   Subjective:  Chart reviewed, case discussed in multidisciplinary meeting, patient seen during rounds.   12/16: On interview today, patient is noted to be interacting in the milieu.  She returns to room to engage in interview with provider. She is alert and oriented x 3. She is noted to display disorganized thought processes with tangential speech and paranoid ideation.  She denies SI/HI/plan and denies hallucinations.  Patient is noted to have elevated blood pressure and heart rate.  Will add Zoloft  to address suspected SSRI withdrawal symptoms as Paxil  appears to have been discontinued without taper.  Patient reports poor sleep and fair appetite.  She denies current depression but does endorse anxiety.  PDMP reviewed.  Patient has taken Klonopin  for some time.  Patient is unable to state when she last took Klonopin .  Will restart Klonopin  0.5 mg twice daily.  Patient reports she was living with Mr. Signa and states she does not feel that this home is safe to return to.  Patient gives provider consent to contact her father, [name].  Patient does not voice any concerns or complaints at this time.  Past Psychiatric History: see h&P Family History:  Family History  Problem Relation Age of Onset   Cancer Mother    Social History:  Social History   Substance and Sexual Activity  Alcohol Use No     Social History   Substance and Sexual Activity  Drug Use Yes   Types: Marijuana   Comment: reports daily cannabis use    Social History   Socioeconomic History   Marital status: Divorced    Spouse name: Not on file   Number of children: Not on file   Years of education: Not on file   Highest education level: Not on file  Occupational History   Occupation: unemployed  Tobacco Use   Smoking status: Every Day    Current packs/day: 1.50    Types: Cigarettes   Smokeless tobacco: Never  Vaping Use    Vaping status: Never Used  Substance and Sexual Activity   Alcohol use: No   Drug use: Yes    Types: Marijuana    Comment: reports daily cannabis use   Sexual activity: Yes    Partners: Male  Other Topics Concern   Not on file  Social History Narrative   Not on file   Social Drivers of Health   Tobacco Use: High Risk (06/09/2024)   Patient History    Smoking Tobacco Use: Every Day    Smokeless Tobacco Use: Never    Passive Exposure: Not on file  Financial Resource Strain: High Risk (12/13/2023)   Received from [facility]   Overall Financial Resource Strain (CARDIA)    Difficulty of Paying Living Expenses: Hard  Food Insecurity: No Food Insecurity (06/09/2024)   Epic    Worried About Radiation Protection Practitioner of Food in the Last Year: Never true    Ran Out of Food in the Last Year: Never true  Transportation Needs: No Transportation Needs (06/09/2024)   Epic    Lack of Transportation (Medical): No    Lack of Transportation (Non-Medical): No  Physical Activity: Not on file  Stress: Not on file  Social Connections: Not on file  Depression (EYV7-0): Not on file  Alcohol Screen: Low Risk (06/09/2024)   Alcohol Screen    Last Alcohol Screening Score (AUDIT): 0  Housing: Low Risk (06/09/2024)  Epic    Unable to Pay for Housing in the Last Year: No    Number of Times Moved in the Last Year: 0    Homeless in the Last Year: No  Utilities: Not At Risk (06/09/2024)   Epic    Threatened with loss of utilities: No  Health Literacy: Not on file   Past Medical History:  Past Medical History:  Diagnosis Date   Degeneration of lumbar intervertebral disc    Depression    DVT (deep vein thrombosis) in pregnancy    Schizoaffective disorder (HCC)    Seizures (HCC)    Shoulder pain, right     Past Surgical History:  Procedure Laterality Date   CESAREAN SECTION     X2    Current Medications: Current Facility-Administered Medications  Medication Dose Route Frequency  Provider Last Rate Last Admin   acetaminophen  (TYLENOL ) tablet 650 mg  650 mg Oral Q6H PRN Hampton, Tracie B, NP   650 mg at 06/09/24 1721   alum & mag hydroxide-simeth (MAALOX/MYLANTA) 200-200-20 MG/5ML suspension 30 mL  30 mL Oral Q4H PRN Hampton, Tracie B, NP       carbamazepine  (TEGRETOL ) tablet 200 mg  200 mg Oral BID Stack, Colleen M, MD   200 mg at 06/11/24 1715   clonazePAM  (KLONOPIN ) tablet 0.5 mg  0.5 mg Oral BID Bawi Lakins L, PA-C   0.5 mg at 06/11/24 1600   hydrOXYzine  (ATARAX ) tablet 25 mg  25 mg Oral TID PRN Hampton, Tracie B, NP   25 mg at 06/11/24 9180   hydrOXYzine  (ATARAX ) tablet 50 mg  50 mg Oral TID PRN Hampton, Tracie B, NP       magnesium  hydroxide (MILK OF MAGNESIA) suspension 30 mL  30 mL Oral Daily PRN Hampton, Tracie B, NP       nicotine  (NICODERM CQ  - dosed in mg/24 hours) patch 21 mg  21 mg Transdermal Daily Hampton, Tracie B, NP   21 mg at 06/11/24 0816   OLANZapine  (ZYPREXA ) injection 5 mg  5 mg Intramuscular TID PRN Hampton, Tracie B, NP       OLANZapine  zydis (ZYPREXA ) disintegrating tablet 5 mg  5 mg Oral TID PRN Hampton, Tracie B, NP       OLANZapine  zydis (ZYPREXA ) disintegrating tablet 5 mg  5 mg Oral BID Madaram, Kondal R, MD   5 mg at 06/11/24 1717   sertraline  (ZOLOFT ) tablet 50 mg  50 mg Oral Daily Kaid Seeberger L, PA-C   50 mg at 06/11/24 1545   traZODone  (DESYREL ) tablet 50 mg  50 mg Oral QHS PRN Hampton, Tracie B, NP   50 mg at 06/08/24 2144    Lab Results:  Results for orders placed or performed during the hospital encounter of 06/08/24 (from the past 48 hours)  Lipid panel     Status: None   Collection Time: 06/11/24  9:25 AM  Result Value Ref Range   Cholesterol 143 0 - 200 mg/dL    Comment:        ATP III CLASSIFICATION:  <200     mg/dL   Desirable  799-760  mg/dL   Borderline High  >=759    mg/dL   High           Triglycerides 91 <150 mg/dL   HDL 48 >59 mg/dL   Total CHOL/HDL Ratio 3.0 RATIO   VLDL 18 0 - 40 mg/dL   LDL  Cholesterol 77 0 - 99 mg/dL    Comment:  Total Cholesterol/HDL:CHD Risk Coronary Heart Disease Risk Table                     Men   Women  1/2 Average Risk   3.4   3.3  Average Risk       5.0   4.4  2 X Average Risk   9.6   7.1  3 X Average Risk  23.4   11.0        Use the calculated Patient Ratio above and the CHD Risk Table to determine the patient's CHD Risk.        ATP III CLASSIFICATION (LDL):  <100     mg/dL   Optimal  899-870  mg/dL   Near or Above                    Optimal  130-159  mg/dL   Borderline  839-810  mg/dL   High  >809     mg/dL   Very High Performed at [facility], [address] [place], [place] [address]   TSH     Status: None   Collection Time: 06/11/24  9:25 AM  Result Value Ref Range   TSH 0.923 0.350 - 4.500 uIU/mL    Comment: Performed at [facility], [address] [place], [place] [address]    Blood Alcohol level:  Lab Results  Component Value Date   [facility] <15 06/08/2024   ETH <10 02/24/2023    Metabolic Disorder Labs: Lab Results  Component Value Date   HGBA1C 5.2 08/02/2022   MPG 102.54 08/02/2022   MPG 102.54 03/17/2022   No results found for: PROLACTIN Lab Results  Component Value Date   CHOL 143 06/11/2024   TRIG 91 06/11/2024   HDL 48 06/11/2024   CHOLHDL 3.0 06/11/2024   VLDL 18 06/11/2024   LDLCALC 77 06/11/2024   LDLCALC 101 (H) 08/02/2022    Physical Findings: AIMS:  , ,  ,  ,    CIWA:    COWS:      Psychiatric Specialty Exam:  Presentation  General Appearance:  Bizarre; Disheveled  Eye Contact: Poor  Speech: Blocked  Speech Volume: Decreased    Mood and Affect  Mood: Anxious  Affect: Flat; Inappropriate   Thought Process  Thought Processes: Disorganized  Orientation: Full  Thought Content: Paranoid ideation  Hallucinations: Denies Ideas of Reference: None  Suicidal Thoughts: Denies Homicidal Thoughts: Denies  Sensorium  Memory: Other (comment)  (unable to assess)  Judgment: Impaired  Insight: None   Executive Functions  Concentration: Poor  Attention Span: Poor  Recall: -- (unable to assess)  Fund of Knowledge: Other (comment) (unable to assess)  Language: Fair   Psychomotor Activity  Psychomotor Activity: Normal Musculoskeletal: Strength & Muscle Tone: within normal limits Gait & Station: normal Assets  Assets: Other (comment) (unable to assess)    Physical Exam: Physical Exam ROS Blood pressure (!) 142/95, pulse 74, temperature 98.8 F (37.1 C), temperature source Oral, resp. rate 16, height 5' 1 (1.549 m), weight 64 kg, last menstrual period 06/05/2024, SpO2 100%. Body mass index is 26.67 kg/m.  Diagnosis: Principal Problem:   Bipolar disorder (HCC)   PLAN: Safety and Monitoring:  -- Voluntary admission to inpatient psychiatric unit for safety, stabilization and treatment  -- Daily contact with patient to assess and evaluate symptoms and progress in treatment  -- Patient's case to be discussed in multi-disciplinary team meeting  -- Observation Level :  q15 minute checks  -- Vital signs:  q12 hours  -- Precautions: suicide, elopement, and assault -- Encouraged patient to participate in unit milieu and in scheduled group therapies  2. Psychiatric Treatment:  Scheduled Medications: Zyprexa  5 mg twice daily Zoloft  50 mg once daily Klonopin  0.5 mg twice daily    -- The risks/benefits/side-effects/alternatives to this medication were discussed in detail with the patient and time was given for questions. The patient consents to medication trial.  3. Medical Issues Being Addressed:  Carbamazepine  200 mg twice daily for seizure disorder per neurology   4. Discharge Planning:   -- Social work and case management to assist with discharge planning and identification of hospital follow-up needs prior to discharge  -- Estimated LOS: 5-7 days  [employer], PA-C 06/11/2024, 6:01 PM

## 2024-06-11 NOTE — Progress Notes (Signed)
 Patient reports anxiety denies SI/Hi/A/VH and verbally contracts for safety. BP elevated this shift Provider notified. Q 15 minutes safety checks ongoing Patient remains safe. Patient compliant with medications no adverse effects noted.

## 2024-06-12 DIAGNOSIS — F319 Bipolar disorder, unspecified: Secondary | ICD-10-CM | POA: Diagnosis not present

## 2024-06-12 LAB — HEMOGLOBIN A1C
Hgb A1c MFr Bld: 5 % (ref 4.8–5.6)
Mean Plasma Glucose: 96.8 mg/dL

## 2024-06-12 MED ORDER — HYDROXYZINE HCL 25 MG PO TABS: 25.0000 mg | ORAL_TABLET | Freq: Three times a day (TID) | ORAL | Status: DC | PRN

## 2024-06-12 NOTE — Plan of Care (Signed)
  Problem: Education: Goal: Knowledge of  General Education information/materials will improve Outcome: Progressing Goal: Emotional status will improve Outcome: Progressing Goal: Mental status will improve Outcome: Progressing Goal: Verbalization of understanding the information provided will improve Outcome: Progressing   Problem: Activity: Goal: Interest or engagement in activities will improve Outcome: Progressing Goal: Sleeping patterns will improve Outcome: Progressing   Problem: Coping: Goal: Ability to verbalize frustrations and anger appropriately will improve Outcome: Progressing Goal: Ability to demonstrate self-control will improve Outcome: Progressing   Problem: Health Behavior/Discharge Planning: Goal: Identification of resources available to assist in meeting health care needs will improve Outcome: Progressing Goal: Compliance with treatment plan for underlying cause of condition will improve Outcome: Progressing   Problem: Physical Regulation: Goal: Ability to maintain clinical measurements within normal limits will improve Outcome: Progressing   Problem: Safety: Goal: Periods of time without injury will increase Outcome: Progressing   Problem: Activity: Goal: Will identify at least one activity in which they can participate Outcome: Progressing   Problem: Coping: Goal: Ability to identify and develop effective coping behavior will improve Outcome: Progressing Goal: Ability to interact with others will improve Outcome: Progressing Goal: Demonstration of participation in decision-making regarding own care will improve Outcome: Progressing Goal: Ability to use eye contact when communicating with others will improve Outcome: Progressing   Problem: Health Behavior/Discharge Planning: Goal: Identification of resources available to assist in meeting health care needs will improve Outcome: Progressing   Problem: Self-Concept: Goal: Will  verbalize positive feelings about self Outcome: Progressing   

## 2024-06-12 NOTE — Plan of Care (Signed)
   Problem: Education: Goal: Mental status will improve Outcome: Progressing Goal: Verbalization of understanding the information provided will improve Outcome: Progressing   Problem: Activity: Goal: Interest or engagement in activities will improve Outcome: Progressing

## 2024-06-12 NOTE — Group Note (Signed)
 [facility] LCSW Group Therapy Note   Group Date: 06/12/2024 Start Time: 1300 End Time: 1400   Type of Therapy/Topic:  Group Therapy:  Emotion Regulation  Participation Level:  Active   Description of Group:    The purpose of this group is to assist patients in learning to regulate negative emotions and experience positive emotions. Patients will be guided to discuss ways in which they have been vulnerable to their negative emotions. These vulnerabilities will be juxtaposed with experiences of positive emotions or situations, and patients challenged to use positive emotions to combat negative ones. Special emphasis will be placed on coping with negative emotions in conflict situations, and patients will process healthy conflict resolution skills.  Therapeutic Goals: Patient will identify two positive emotions or experiences to reflect on in order to balance out negative emotions:  Patient will label two or more emotions that they find the most difficult to experience:  Patient will be able to demonstrate positive conflict resolution skills through discussion or role plays:   Summary of Patient Progress: Patient was present for the entirety of the group discussion. She was actively engaged with the topic but at times her comments were tangential. Insight into the topic remains questionable. She appeared open and receptive to feedback/comments from both her peers and the facilitator.  Therapeutic Modalities:   Cognitive Behavioral Therapy Feelings Identification Dialectical Behavioral Therapy   Adrienne JONELLE Fam, LCSW

## 2024-06-12 NOTE — Plan of Care (Signed)
   Problem: Coping: Goal: Ability to verbalize frustrations and anger appropriately will improve Outcome: Progressing   Problem: Safety: Goal: Periods of time without injury will increase Outcome: Progressing

## 2024-06-12 NOTE — BHH Counselor (Signed)
 CST referral sent to [facility] on patient's behalf.   Clorinda Wyble, MSW, LCSWA 06/12/2024 4:34 PM

## 2024-06-12 NOTE — Group Note (Signed)
 Date:  06/12/2024 Time:  10:20 AM  Group Topic/Focus:  Goals Group:   The focus of this group is to help patients establish daily goals to achieve during treatment and discuss how the patient can incorporate goal setting into their daily lives to aide in recovery.    Participation Level:  Active  Participation Quality:  Appropriate  Affect:  Appropriate  Cognitive:  Appropriate  Insight: Appropriate  Engagement in Group:  Engaged and Supportive  Modes of Intervention:  Discussion, Education, and Support  Additional Comments:    Deitra Caron Main[place] 06/12/2024, 10:20 AM
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
## 2024-06-12 NOTE — Progress Notes (Signed)
 Pt denies SI/HI/AVH at this time. PRN Trazodone  50mg  given for trouble sleeping, which was effective. Q 15 min safety checks in place.  06/11/24 2210  Psych Admission Type (Psych Patients Only)  Admission Status Voluntary  Psychosocial Assessment  Patient Complaints Anxiety  Eye Contact Fair  Facial Expression Animated  Affect Appropriate to circumstance  Speech Logical/coherent  Interaction Assertive  Motor Activity Other (Comment) (wnl)  Appearance/Hygiene Unremarkable  Behavior Characteristics Cooperative  Mood Pleasant  Thought Process  Coherency Circumstantial  Content Preoccupation  Delusions None reported or observed  Perception WDL  Hallucination None reported or observed  Judgment Impaired  Confusion Mild  Danger to Self  Current suicidal ideation? Denies  Danger to Others  Danger to Others None reported or observed

## 2024-06-12 NOTE — Group Note (Signed)
 Date:  06/12/2024 Time:  8:43 PM  Group Topic/Focus:  Wrap-Up Group:   The focus of this group is to help patients review their daily goal of treatment and discuss progress on daily workbooks.    Participation Level:  Active  Participation Quality:  Appropriate and Attentive  Affect:  Appropriate  Cognitive:  Alert and Appropriate  Insight: Appropriate and Good  Engagement in Group:  Engaged  Modes of Intervention:  Orientation  Additional Comments:     Arlester CHRISTELLA Servant 06/12/2024, 8:43 PM
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
## 2024-06-12 NOTE — Plan of Care (Signed)
 Neurology plan of care  Please see neurology progress note dated 06/09/25. Please notify us  if patient has breakthrough seizures or side effects on carbamazepine . Otherwise we will be available prn for questions going forward.  Elida Ross, MD [facility] [phone]  If 7pm- 7am, please page neurology on call as listed in [facility].

## 2024-06-12 NOTE — Progress Notes (Signed)
°   06/12/24 2100  Psych Admission Type (Psych Patients Only)  Admission Status Voluntary  Psychosocial Assessment  Patient Complaints Anxiety  Eye Contact Fair  Facial Expression Anxious  Affect Preoccupied  Speech Soft  Interaction Childlike  Motor Activity Slow  Appearance/Hygiene In scrubs  Behavior Characteristics Cooperative;Appropriate to situation  Mood Pleasant  Thought Process  Coherency Circumstantial  Content Preoccupation  Delusions Paranoid  Perception WDL  Hallucination None reported or observed  Judgment Poor  Confusion Mild  Danger to Self  Current suicidal ideation? Denies  Danger to Others  Danger to Others None reported or observed

## 2024-06-13 MED ORDER — SALINE SPRAY 0.65 % NA SOLN
1.0000 | NASAL | Status: DC | PRN
  Administered 2024-06-13 – 2024-06-15 (×2): 1 via NASAL
  Filled 2024-06-13: qty 44

## 2024-06-13 NOTE — Group Note (Signed)
 Date:  06/13/2024 Time:  10:14 AM  Group Topic/Focus:  Emotional Education:   The focus of this group is to discuss what feelings/emotions are, and how they are experienced.    Participation Level:  Active  Participation Quality:  Appropriate  Affect:  Appropriate  Cognitive:  Appropriate  Insight: Appropriate  Engagement in Group:  Engaged  Modes of Intervention:  Activity  Additional Comments:    Camellia HERO Cordarrius Coad 06/13/2024, 10:14 AM

## 2024-06-13 NOTE — Group Note (Signed)
 [facility] LCSW Group Therapy Note   Group Date: 06/13/2024 Start Time: 1300 End Time: 1400   Type of Therapy/Topic:  Group Therapy:  Balance in Life  Participation Level:  Did Not Attend   Description of Group:    This group will address the concept of balance and how it feels and looks when one is unbalanced. Patients will be encouraged to process areas in their lives that are out of balance, and identify reasons for remaining unbalanced. Facilitators will guide patients utilizing problem- solving interventions to address and correct the stressor making their life unbalanced. Understanding and applying boundaries will be explored and addressed for obtaining  and maintaining a balanced life. Patients will be encouraged to explore ways to assertively make their unbalanced needs known to significant others in their lives, using other group members and facilitator for support and feedback.  Therapeutic Goals: Patient will identify two or more emotions or situations they have that consume much of in their lives. Patient will identify signs/triggers that life has become out of balance:  Patient will identify two ways to set boundaries in order to achieve balance in their lives:  Patient will demonstrate ability to communicate their needs through discussion and/or role plays  Summary of Patient Progress: Patient did not attend group.   Therapeutic Modalities:   Cognitive Behavioral Therapy Solution-Focused Therapy Assertiveness Training   Nadara JONELLE Fam, LCSW

## 2024-06-13 NOTE — Group Note (Signed)
 Date:  06/13/2024 Time:  5:12 PM  Group Topic/Focus:  Activity Group: The focus of the group is to promote activity for the patients and encourage them to go outside to the courtyard and get some fresh air and exercise.    Participation Level:  Did Not Attend   Camellia HERO Leiana Rund 06/13/2024, 5:12 PM

## 2024-06-13 NOTE — Progress Notes (Signed)
 [facility] MD Progress Note  06/13/2024 4:53 PM Lennyx Verdell  MRN:  [id number]   Subjective:  Chart reviewed, case discussed in multidisciplinary meeting, patient seen during rounds.   12/18: On interview today, patient is noted to be lying in bed.  Patient did require this provider to awaken patient multiple times during interview today, but when awoken responding appropriately to questions.  Patient noted to be oriented x 4 and cooperative with psychiatry team.  Patient still appears to display disorganized thought process and paranoid ideation.  Patient denied suicidal or homicidal ideations as well as auditory or visual hallucinations.  She did report paranoia and the feeling that another resident is following her and out to get her.  Patient reported fair sleep and fair appetite.  Patient did still endorse anxiety on exam.  12/17: On interview today, patient is noted to be interacting appropriately in the milieu.  She returns to room to engage in interview with provider.  She is noted to be alert and oriented, calm and cooperative.  She continues to display disorganized thought process with tangential speech and paranoid ideation.  She denies SI/HI/plan and denies current hallucinations.  She endorses history of visual hallucinations at home, stating she sees the devil walking around and angels with long hair.  She reports feeling safe on the unit.  She reports improved sleep last night.  She reports good appetite.  She denies current depressive symptoms and describes anxiety as a whole lot better.  She is tolerating medication regimen well without adverse effects.   12/16: On interview today, patient is noted to be interacting in the milieu.  She returns to room to engage in interview with provider. She is alert and oriented x 3. She is noted to display disorganized thought processes with tangential speech and paranoid ideation.  She denies SI/HI/plan and denies hallucinations.  Patient is  noted to have elevated blood pressure and heart rate.  Will add Zoloft  to address suspected SSRI withdrawal symptoms as Paxil  appears to have been discontinued without taper.  Patient reports poor sleep and fair appetite.  She denies current depression but does endorse anxiety.  PDMP reviewed.  Patient has taken Klonopin  for some time.  Patient is unable to state when she last took Klonopin .  Will restart Klonopin  0.5 mg twice daily.  Patient reports she was living with Mr. Signa and states she does not feel that this home is safe to return to.  Patient gives provider consent to contact her father, [name].  Patient does not voice any concerns or complaints at this time.   Past Psychiatric History: see h&P Family History:  Family History  Problem Relation Age of Onset   Cancer Mother    Social History:  Social History   Substance and Sexual Activity  Alcohol Use No     Social History   Substance and Sexual Activity  Drug Use Yes   Types: Marijuana   Comment: reports daily cannabis use    Social History   Socioeconomic History   Marital status: Divorced    Spouse name: Not on file   Number of children: Not on file   Years of education: Not on file   Highest education level: Not on file  Occupational History   Occupation: unemployed  Tobacco Use   Smoking status: Every Day    Current packs/day: 1.50    Types: Cigarettes   Smokeless tobacco: Never  Vaping Use   Vaping status: Never Used  Substance and  Sexual Activity   Alcohol use: No   Drug use: Yes    Types: Marijuana    Comment: reports daily cannabis use   Sexual activity: Yes    Partners: Male  Other Topics Concern   Not on file  Social History Narrative   Not on file   Social Drivers of Health   Tobacco Use: High Risk (06/09/2024)   Patient History    Smoking Tobacco Use: Every Day    Smokeless Tobacco Use: Never    Passive Exposure: Not on file  Financial Resource Strain: High Risk (12/13/2023)    Received from [facility]   Overall Financial Resource Strain (CARDIA)    Difficulty of Paying Living Expenses: Hard  Food Insecurity: No Food Insecurity (06/09/2024)   Epic    Worried About Running Out of Food in the Last Year: Never true    Ran Out of Food in the Last Year: Never true  Transportation Needs: No Transportation Needs (06/09/2024)   Epic    Lack of Transportation (Medical): No    Lack of Transportation (Non-Medical): No  Physical Activity: Not on file  Stress: Not on file  Social Connections: Not on file  Depression (EYV7-0): Not on file  Alcohol Screen: Low Risk (06/09/2024)   Alcohol Screen    Last Alcohol Screening Score (AUDIT): 0  Housing: Low Risk (06/09/2024)   Epic    Unable to Pay for Housing in the Last Year: No    Number of Times Moved in the Last Year: 0    Homeless in the Last Year: No  Utilities: Not At Risk (06/09/2024)   Epic    Threatened with loss of utilities: No  Health Literacy: Not on file   Past Medical History:  Past Medical History:  Diagnosis Date   Degeneration of lumbar intervertebral disc    Depression    DVT (deep vein thrombosis) in pregnancy    Schizoaffective disorder (HCC)    Seizures (HCC)    Shoulder pain, right     Past Surgical History:  Procedure Laterality Date   CESAREAN SECTION     X2    Current Medications: Current Facility-Administered Medications  Medication Dose Route Frequency Provider Last Rate Last Admin   acetaminophen  (TYLENOL ) tablet 650 mg  650 mg Oral Q6H PRN Hampton, Tracie B, NP   650 mg at 06/13/24 0703   alum & mag hydroxide-simeth (MAALOX/MYLANTA) 200-200-20 MG/5ML suspension 30 mL  30 mL Oral Q4H PRN Hampton, Tracie B, NP       carbamazepine  (TEGRETOL ) tablet 200 mg  200 mg Oral BID Stack, Colleen M, MD   200 mg at 06/13/24 1618   clonazePAM  (KLONOPIN ) tablet 0.5 mg  0.5 mg Oral BID Hunter, Crystal L, PA-C   0.5 mg at 06/13/24 1618   hydrOXYzine  (ATARAX ) tablet 25 mg  25 mg  Oral TID PRN Hunter, Crystal L, PA-C       magnesium  hydroxide (MILK OF MAGNESIA) suspension 30 mL  30 mL Oral Daily PRN Hampton, Tracie B, NP       nicotine  (NICODERM CQ  - dosed in mg/24 hours) patch 21 mg  21 mg Transdermal Daily Hampton, Tracie B, NP   21 mg at 06/13/24 0815   OLANZapine  (ZYPREXA ) injection 5 mg  5 mg Intramuscular TID PRN Hampton, Tracie B, NP       OLANZapine  zydis (ZYPREXA ) disintegrating tablet 5 mg  5 mg Oral TID PRN Hampton, Tracie B, NP  OLANZapine  zydis (ZYPREXA ) disintegrating tablet 5 mg  5 mg Oral BID Madaram, Kondal R, MD   5 mg at 06/13/24 1618   sertraline  (ZOLOFT ) tablet 50 mg  50 mg Oral Daily Hunter, Crystal L, PA-C   50 mg at 06/13/24 1618   traZODone  (DESYREL ) tablet 50 mg  50 mg Oral QHS PRN Hampton, Tracie B, NP   50 mg at 06/12/24 2121    Lab Results:  No results found for this or any previous visit (from the past 48 hours).   Blood Alcohol level:  Lab Results  Component Value Date   [facility] <15 06/08/2024   ETH <10 02/24/2023    Metabolic Disorder Labs: Lab Results  Component Value Date   HGBA1C 5.0 06/11/2024   MPG 96.8 06/11/2024   MPG 102.54 08/02/2022   No results found for: PROLACTIN Lab Results  Component Value Date   CHOL 143 06/11/2024   TRIG 91 06/11/2024   HDL 48 06/11/2024   CHOLHDL 3.0 06/11/2024   VLDL 18 06/11/2024   LDLCALC 77 06/11/2024   LDLCALC 101 (H) 08/02/2022    Physical Findings: AIMS:  , ,  ,  ,    CIWA:    COWS:      Psychiatric Specialty Exam:  Presentation  General Appearance:  Bizarre; Disheveled  Eye Contact: Fair  Speech: Blocked  Speech Volume: Decreased    Mood and Affect  Mood: Euthymic  Affect: Flat; Inappropriate   Thought Process  Thought Processes: Disorganized  Orientation: Full  Thought Content: Paranoid ideation   Hallucinations: Denies Ideas of Reference: None  Suicidal Thoughts: Denies Homicidal Thoughts: Denies  Sensorium  Memory: Other  (comment) (unable to assess)  Judgment: Impaired  Insight: None   Executive Functions  Concentration: Poor  Attention Span: Poor  Recall: -- (unable to assess)  Fund of Knowledge: Other (comment) (unable to assess)  Language:  Fair   Psychomotor Activity  Psychomotor Activity: Normal Musculoskeletal: Strength & Muscle Tone: within normal limits Gait & Station: normal Assets  Assets: Other (comment) (unable to assess)    Physical Exam: Physical Exam Pulmonary:     Effort: Pulmonary effort is normal.  Neurological:     Mental Status: She is alert and oriented to person, place, and time.    Review of Systems  Respiratory:  Negative for shortness of breath.   Cardiovascular:  Negative for chest pain.  Psychiatric/Behavioral:  The patient is nervous/anxious.    Blood pressure 117/81, pulse (!) 104, temperature 98.2 F (36.8 C), temperature source Oral, resp. rate 16, height 5' 1 (1.549 m), weight 64 kg, last menstrual period 06/05/2024, SpO2 100%. Body mass index is 26.67 kg/m.  Diagnosis: Principal Problem:   Bipolar disorder (HCC)   PLAN: Safety and Monitoring:  -- Voluntary admission to inpatient psychiatric unit for safety, stabilization and treatment  -- Daily contact with patient to assess and evaluate symptoms and progress in treatment  -- Patient's case to be discussed in multi-disciplinary team meeting  -- Observation Level : q15 minute checks  -- Vital signs:  q12 hours  -- Precautions: suicide, elopement, and assault -- Encouraged patient to participate in unit milieu and in scheduled group therapies  2. Psychiatric Treatment:  Scheduled Medications: Zyprexa  5 mg twice daily - will plan to increase nighttime dose to 10 mg tomorrow pending response and tolerability  Zoloft  50 mg once daily Klonopin  0.5 mg twice daily         -- The risks/benefits/side-effects/alternatives to this medication were  discussed in detail with the patient and  time was given for questions. The patient consents to medication trial.  3. Medical Issues Being Addressed:  Carbamazepine  200 mg twice daily for seizure disorder per neurology    4. Discharge Planning:   -- Social work and case management to assist with discharge planning and identification of hospital follow-up needs prior to discharge  -- Estimated LOS: 5-7 days  Zelda Sharps, NP This note was created using [employer]. Please excuse any inadvertent transcription errors. Case was discussed with supervising physician Dr. Jadapalle who is agreeable with current plan.

## 2024-06-13 NOTE — Plan of Care (Signed)
  Problem: Education: Goal: Knowledge of  General Education information/materials will improve Outcome: Progressing Goal: Emotional status will improve Outcome: Progressing Goal: Mental status will improve Outcome: Progressing Goal: Verbalization of understanding the information provided will improve Outcome: Progressing   Problem: Activity: Goal: Interest or engagement in activities will improve Outcome: Progressing Goal: Sleeping patterns will improve Outcome: Progressing   Problem: Coping: Goal: Ability to verbalize frustrations and anger appropriately will improve Outcome: Progressing Goal: Ability to demonstrate self-control will improve Outcome: Progressing   Problem: Health Behavior/Discharge Planning: Goal: Identification of resources available to assist in meeting health care needs will improve Outcome: Progressing Goal: Compliance with treatment plan for underlying cause of condition will improve Outcome: Progressing   Problem: Physical Regulation: Goal: Ability to maintain clinical measurements within normal limits will improve Outcome: Progressing   Problem: Safety: Goal: Periods of time without injury will increase Outcome: Progressing   Problem: Activity: Goal: Will identify at least one activity in which they can participate Outcome: Progressing   Problem: Coping: Goal: Ability to identify and develop effective coping behavior will improve Outcome: Progressing Goal: Ability to interact with others will improve Outcome: Progressing Goal: Demonstration of participation in decision-making regarding own care will improve Outcome: Progressing Goal: Ability to use eye contact when communicating with others will improve Outcome: Progressing   Problem: Health Behavior/Discharge Planning: Goal: Identification of resources available to assist in meeting health care needs will improve Outcome: Progressing   Problem: Self-Concept: Goal: Will  verbalize positive feelings about self Outcome: Progressing   

## 2024-06-13 NOTE — Group Note (Signed)
 Recreation Therapy Group Note   Group Topic:General Recreation  Group Date: 06/13/2024 Start Time: 1525 End Time: 1545 Facilitators: Celestia Jeoffrey BRAVO, LRT, CTRS Location: Courtyard  Group Description: [employer]. LRT and patients played games of basketball, drew with chalk, and played corn hole while outside in the courtyard while getting fresh air and sunlight. Music was being played in the background. LRT and peers conversed about different games they have played before, what they do in their free time and anything else that is on their minds. LRT encouraged pts to drink water after being outside, sweating and getting their heart rate up.  Goal Area(s) Addressed: Patient will build on frustration tolerance skills. Patients will partake in a competitive play game with peers. Patients will gain knowledge of new leisure interest/hobby.    Affect/Mood: N/A   Participation Level: Did not attend    Clinical Observations/Individualized Feedback: Patient did not attend.  Plan: Continue to engage patient in RT group sessions 2-3x/week.   Jeoffrey BRAVO Celestia, LRT, CTRS 06/13/2024 4:23 PM

## 2024-06-13 NOTE — Group Note (Signed)
 Recreation Therapy Group Note   Group Topic:Goal Setting  Group Date: 06/13/2024 Start Time: 1000 End Time: 1050 Facilitators: Celestia Jeoffrey BRAVO, LRT, CTRS Location: Craft Room  Group Description: Product/process Development Scientist. Patients were given many different magazines, a glue stick, markers, and a piece of cardstock paper. LRT and pts discussed the importance of having goals in life. LRT and pts discussed the difference between short-term and long-term goals, as well as what a SMART goal is. LRT encouraged pts to create a vision board, with images they picked and then cut out with safety scissors from the magazine, for themselves, that capture their short and long-term goals. LRT encouraged pts to show and explain their vision board to the group.   Goal Area(s) Addressed:  Patient will gain knowledge of short vs. long term goals.  Patient will identify goals for themselves. Patient will practice setting SMART goals. Patient will verbalize their goals to LRT and peers.  Affect/Mood: Appropriate   Participation Level: Active   Participation Quality: Minimal Cues   Behavior: Calm and Cooperative   Speech/Thought Process: Loose association   Insight: Limited   Judgement: Limited   Modes of Intervention: Art, Education, and Exploration   Patient Response to Interventions:  Receptive   Education Outcome:  In group clarification offered    Clinical Observations/Individualized Feedback: [name] was mostly active in their participation of session activities and group discussion. Pt identified to work and to watch my mouth from saying bad things as goals.    Plan: Continue to engage patient in RT group sessions 2-3x/week.   Jeoffrey BRAVO Celestia, LRT, CTRS 06/13/2024 11:48 AM

## 2024-06-13 NOTE — Progress Notes (Addendum)
 Pt pleasant and cooperative during this shift. Pt given saline nasal spray per request. Pt participated in groups and other activities. Pt slept for part of the day and states that she has been resting well. Pt reported anxiety in the morning but reports it got better after morning medications. Pt denies SI/HI/AVH.    06/13/24 0900  Psych Admission Type (Psych Patients Only)  Admission Status Voluntary  Psychosocial Assessment  Patient Complaints Anxiety  Eye Contact Other (Comment) (appropriate)  Facial Expression Sad  Affect Sad  Speech Soft  Interaction Other (Comment) (appropriate)  Motor Activity Slow  Appearance/Hygiene In scrubs  Behavior Characteristics Cooperative  Mood Pleasant  Thought Process  Coherency Circumstantial  Content WDL  Delusions WDL  Perception WDL  Hallucination None reported or observed  Judgment Poor  Confusion None  Danger to Self  Current suicidal ideation? Denies  Danger to Others  Danger to Others None reported or observed

## 2024-06-13 NOTE — Group Note (Signed)
 Date:  06/13/2024 Time:  9:27 PM  Group Topic/Focus:  Managing Feelings:   The focus of this group is to identify what feelings patients have difficulty handling and develop a plan to handle them in a healthier way upon discharge.    Pt did ot attend group.  [name] L 06/13/2024, 9:27 PM

## 2024-06-14 DIAGNOSIS — F319 Bipolar disorder, unspecified: Secondary | ICD-10-CM | POA: Diagnosis not present

## 2024-06-14 LAB — CARBAMAZEPINE LEVEL, TOTAL: Carbamazepine Lvl: 10.2 ug/mL (ref 4.0–12.0)

## 2024-06-14 MED ORDER — OLANZAPINE 10 MG PO TBDP
10.0000 mg | ORAL_TABLET | Freq: Every day | ORAL | Status: DC
  Administered 2024-06-15: 10 mg via ORAL
  Filled 2024-06-14: qty 1

## 2024-06-14 MED ORDER — CLONAZEPAM 0.5 MG PO TABS
0.5000 mg | ORAL_TABLET | Freq: Two times a day (BID) | ORAL | Status: DC | PRN
  Administered 2024-06-15 – 2024-06-18 (×7): 0.5 mg via ORAL
  Filled 2024-06-14 (×7): qty 1

## 2024-06-14 NOTE — Group Note (Signed)
 Date:  06/14/2024 Time:  7:15 PM  Group Topic/Focus:  Wellness Toolbox:   The focus of this group is to discuss various aspects of wellness, balancing those aspects and exploring ways to increase the ability to experience wellness.  Patients will create a wellness toolbox for use upon discharge.    Participation Level:  Active  Participation Quality:  Appropriate  Affect:  Appropriate  Cognitive:  Appropriate  Insight: Appropriate  Engagement in Group:  Engaged  Modes of Intervention:  Activity and Socialization  Additional Comments:    Deitra Caron Mainland 06/14/2024, 7:15 PM

## 2024-06-14 NOTE — Progress Notes (Signed)
 Pt calm and pleasant during assessment denying SI/HI/AVH. Pt observed by this Clinical research associate interacting appropriately with staff and peers on the unit. Pt compliant with medication administration per MD orders. Pt given education, support, and encouragement to be active in her treatment plan. Pt being monitored Q 15 minutes for safety per unit protocol, remains safe on the unit

## 2024-06-14 NOTE — Plan of Care (Signed)
" °  Problem: Education: Goal: Mental status will improve Outcome: Progressing   Problem: Activity: Goal: Interest or engagement in activities will improve Outcome: Progressing   Problem: Safety: Goal: Periods of time without injury will increase Outcome: Progressing   Problem: Health Behavior/Discharge Planning: Goal: Identification of resources available to assist in meeting health care needs will improve Outcome: Progressing   "

## 2024-06-14 NOTE — Plan of Care (Signed)
   Problem: Education: Goal: Emotional status will improve Outcome: Progressing Goal: Mental status will improve Outcome: Progressing

## 2024-06-14 NOTE — Progress Notes (Signed)
 [facility] MD Progress Note  06/14/2024 11:08 AM [name]  MRN:  [id number]  From admission/initial presentation:   Patient is a 43 year old female with a pphx of Bipolar disorder, Paranoia, psychosis, and catatonia who presented voluntarily to the ED for insomnia and suicidal ideation.    On evaluation, patient presentation bizarre and disheveled. She is wrapped in a blanket on her bed in the dark with a mask pulled below her nose and only over her mouth. Patient responses are minimal, intermittent, and delayed. Patient appears to have though blocking.    Patient answered that she was doing good. She would answer yes/no intermittently to some questions. After about 5 questions patient stopped responding to clinical research associate, but stared at emerson electric. Interview was concluded due to patient being unable to engage.  Subjective:  Chart reviewed, case discussed in multidisciplinary meeting, patient seen during rounds.   12/19:  12/18: On interview today, patient is noted to be lying in bed.  Patient did require this provider to awaken patient multiple times during interview today, but when awoken responding appropriately to questions.  Patient noted to be oriented x 4 and cooperative with psychiatry team.  Patient still appears to display disorganized thought process and paranoid ideation.  Patient denied suicidal or homicidal ideations as well as auditory or visual hallucinations.  She did report paranoia and the feeling that another resident is following her and out to get her.  Patient reported fair sleep and fair appetite.  Patient did still endorse anxiety on exam.  12/17: On interview today, patient is noted to be interacting appropriately in the milieu.  She returns to room to engage in interview with provider.  She is noted to be alert and oriented, calm and cooperative.  She continues to display disorganized thought process with tangential speech and paranoid ideation.  She denies SI/HI/plan and  denies current hallucinations.  She endorses history of visual hallucinations at home, stating she sees the devil walking around and angels with long hair.  She reports feeling safe on the unit.  She reports improved sleep last night.  She reports good appetite.  She denies current depressive symptoms and describes anxiety as a whole lot better.  She is tolerating medication regimen well without adverse effects.   12/16: On interview today, patient is noted to be interacting in the milieu.  She returns to room to engage in interview with provider. She is alert and oriented x 3. She is noted to display disorganized thought processes with tangential speech and paranoid ideation.  She denies SI/HI/plan and denies hallucinations.  Patient is noted to have elevated blood pressure and heart rate.  Will add Zoloft  to address suspected SSRI withdrawal symptoms as Paxil  appears to have been discontinued without taper.  Patient reports poor sleep and fair appetite.  She denies current depression but does endorse anxiety.  PDMP reviewed.  Patient has taken Klonopin  for some time.  Patient is unable to state when she last took Klonopin .  Will restart Klonopin  0.5 mg twice daily.  Patient reports she was living with Mr. Signa and states she does not feel that this home is safe to return to.  Patient gives provider consent to contact her father, [name].  Patient does not voice any concerns or complaints at this time.   Past Psychiatric History: see h&P Family History:  Family History  Problem Relation Age of Onset   Cancer Mother    Social History:  Social History   Substance and  Sexual Activity  Alcohol Use No     Social History   Substance and Sexual Activity  Drug Use Yes   Types: Marijuana   Comment: reports daily cannabis use    Social History   Socioeconomic History   Marital status: Divorced    Spouse name: Not on file   Number of children: Not on file   Years of education: Not on  file   Highest education level: Not on file  Occupational History   Occupation: unemployed  Tobacco Use   Smoking status: Every Day    Current packs/day: 1.50    Types: Cigarettes   Smokeless tobacco: Never  Vaping Use   Vaping status: Never Used  Substance and Sexual Activity   Alcohol use: No   Drug use: Yes    Types: Marijuana    Comment: reports daily cannabis use   Sexual activity: Yes    Partners: Male  Other Topics Concern   Not on file  Social History Narrative   Not on file   Social Drivers of Health   Tobacco Use: High Risk (06/09/2024)   Patient History    Smoking Tobacco Use: Every Day    Smokeless Tobacco Use: Never    Passive Exposure: Not on file  Financial Resource Strain: High Risk (12/13/2023)   Received from [facility]   Overall Financial Resource Strain (CARDIA)    Difficulty of Paying Living Expenses: Hard  Food Insecurity: No Food Insecurity (06/09/2024)   Epic    Worried About Radiation Protection Practitioner of Food in the Last Year: Never true    Ran Out of Food in the Last Year: Never true  Transportation Needs: No Transportation Needs (06/09/2024)   Epic    Lack of Transportation (Medical): No    Lack of Transportation (Non-Medical): No  Physical Activity: Not on file  Stress: Not on file  Social Connections: Not on file  Depression (EYV7-0): Not on file  Alcohol Screen: Low Risk (06/09/2024)   Alcohol Screen    Last Alcohol Screening Score (AUDIT): 0  Housing: Low Risk (06/09/2024)   Epic    Unable to Pay for Housing in the Last Year: No    Number of Times Moved in the Last Year: 0    Homeless in the Last Year: No  Utilities: Not At Risk (06/09/2024)   Epic    Threatened with loss of utilities: No  Health Literacy: Not on file   Past Medical History:  Past Medical History:  Diagnosis Date   Degeneration of lumbar intervertebral disc    Depression    DVT (deep vein thrombosis) in pregnancy    Schizoaffective disorder (HCC)     Seizures (HCC)    Shoulder pain, right     Past Surgical History:  Procedure Laterality Date   CESAREAN SECTION     X2    Current Medications: Current Facility-Administered Medications  Medication Dose Route Frequency Provider Last Rate Last Admin   acetaminophen  (TYLENOL ) tablet 650 mg  650 mg Oral Q6H PRN Hampton, Tracie B, NP   650 mg at 06/14/24 0308   alum & mag hydroxide-simeth (MAALOX/MYLANTA) 200-200-20 MG/5ML suspension 30 mL  30 mL Oral Q4H PRN Hampton, Tracie B, NP       carbamazepine  (TEGRETOL ) tablet 200 mg  200 mg Oral BID Stack, Colleen M, MD   200 mg at 06/14/24 0818   clonazePAM  (KLONOPIN ) tablet 0.5 mg  0.5 mg Oral BID Hunter, Crystal L, PA-C   0.5  mg at 06/14/24 0818   hydrOXYzine  (ATARAX ) tablet 25 mg  25 mg Oral TID PRN Hunter, Crystal L, PA-C       magnesium  hydroxide (MILK OF MAGNESIA) suspension 30 mL  30 mL Oral Daily PRN Hampton, Tracie B, NP       nicotine  (NICODERM CQ  - dosed in mg/24 hours) patch 21 mg  21 mg Transdermal Daily Hampton, Tracie B, NP   21 mg at 06/14/24 9180   OLANZapine  (ZYPREXA ) injection 5 mg  5 mg Intramuscular TID PRN Hampton, Tracie B, NP       OLANZapine  zydis (ZYPREXA ) disintegrating tablet 5 mg  5 mg Oral TID PRN Hampton, Tracie B, NP       OLANZapine  zydis (ZYPREXA ) disintegrating tablet 5 mg  5 mg Oral BID Madaram, Kondal R, MD   5 mg at 06/14/24 0818   sertraline  (ZOLOFT ) tablet 50 mg  50 mg Oral Daily Hunter, Crystal L, PA-C   50 mg at 06/13/24 1618   sodium chloride  (OCEAN) 0.65 % nasal spray 1 spray  1 spray Each Nare PRN Donnelly Mellow, MD   1 spray at 06/13/24 1839   traZODone  (DESYREL ) tablet 50 mg  50 mg Oral QHS PRN Hampton, Tracie B, NP   50 mg at 06/13/24 2052    Lab Results:  No results found for this or any previous visit (from the past 48 hours).   Blood Alcohol level:  Lab Results  Component Value Date   [facility] <15 06/08/2024   ETH <10 02/24/2023    Metabolic Disorder Labs: Lab Results  Component Value Date    HGBA1C 5.0 06/11/2024   MPG 96.8 06/11/2024   MPG 102.54 08/02/2022   No results found for: PROLACTIN Lab Results  Component Value Date   CHOL 143 06/11/2024   TRIG 91 06/11/2024   HDL 48 06/11/2024   CHOLHDL 3.0 06/11/2024   VLDL 18 06/11/2024   LDLCALC 77 06/11/2024   LDLCALC 101 (H) 08/02/2022    Physical Findings: AIMS:  , ,  ,  ,    CIWA:    COWS:      Psychiatric Specialty Exam:  Presentation  General Appearance:  Bizarre; Disheveled  Eye Contact: Fair  Speech: Blocked  Speech Volume: Decreased    Mood and Affect  Mood: Euthymic  Affect: Flat; Inappropriate   Thought Process  Thought Processes: Disorganized  Orientation: Full  Thought Content: Paranoid ideation   Hallucinations: Denies Ideas of Reference: None  Suicidal Thoughts: Denies Homicidal Thoughts: Denies  Sensorium  Memory: Other (comment) (unable to assess)  Judgment: Impaired  Insight: None   Executive Functions  Concentration: Poor  Attention Span: Poor  Recall: -- (unable to assess)  Fund of Knowledge: Other (comment) (unable to assess)  Language:  Fair   Psychomotor Activity  Psychomotor Activity: Normal Musculoskeletal: Strength & Muscle Tone: within normal limits Gait & Station: normal Assets  Assets: Other (comment) (unable to assess)    Physical Exam: Physical Exam Pulmonary:     Effort: Pulmonary effort is normal.  Neurological:     Mental Status: She is alert and oriented to person, place, and time.    Review of Systems  Respiratory:  Negative for shortness of breath.   Cardiovascular:  Negative for chest pain.  Psychiatric/Behavioral:  The patient is nervous/anxious.    Blood pressure (!) 120/90, pulse 78, temperature 97.8 F (36.6 C), temperature source Oral, resp. rate 16, height 5' 1 (1.549 m), weight 64 kg, last menstrual period  06/05/2024, SpO2 100%. Body mass index is 26.67 kg/m.  Diagnosis: Principal Problem:    Bipolar disorder (HCC)   PLAN: Safety and Monitoring:  -- Voluntary admission to inpatient psychiatric unit for safety, stabilization and treatment  -- Daily contact with patient to assess and evaluate symptoms and progress in treatment  -- Patient's case to be discussed in multi-disciplinary team meeting  -- Observation Level : q15 minute checks  -- Vital signs:  q12 hours  -- Precautions: suicide, elopement, and assault -- Encouraged patient to participate in unit milieu and in scheduled group therapies  2. Psychiatric Treatment:  Scheduled Medications: Zyprexa  5 mg twice daily - will plan to increase nighttime dose to 10 mg tomorrow pending response and tolerability  Zoloft  50 mg once daily Klonopin  0.5 mg twice daily         -- The risks/benefits/side-effects/alternatives to this medication were discussed in detail with the patient and time was given for questions. The patient consents to medication trial.  3. Medical Issues Being Addressed:  Carbamazepine  200 mg twice daily for seizure disorder per neurology    4. Discharge Planning:   -- Social work and case management to assist with discharge planning and identification of hospital follow-up needs prior to discharge  -- Estimated LOS: 5-7 days  Shakeel Disney PMHNP-BC

## 2024-06-14 NOTE — Group Note (Signed)
 Date:  06/14/2024 Time:  10:39 AM  Group Topic/Focus:  Goals Group:   The focus of this group is to help patients establish daily goals to achieve during treatment and discuss how the patient can incorporate goal setting into their daily lives to aide in recovery.    Participation Level:  Active  Participation Quality:  Appropriate  Affect:  Appropriate  Cognitive:  Appropriate  Insight: Appropriate  Engagement in Group:  Engaged  Modes of Intervention:  Discussion, Education, and Support  Additional Comments:    Deitra Caron Mainland 06/14/2024, 10:39 AM

## 2024-06-14 NOTE — Progress Notes (Signed)
 MEDICATION RELATED CONSULT NOTE - FOLLOW UP   Pharmacy Consult for Carbamazepine    Indication: Bipolar and epilepsy  Allergies[1]  Patient Measurements: Height: 5' 1 (154.9 cm) Weight: 64 kg (141 lb 2.2 oz) IBW/kg (Calculated) : 47.8   Vital Signs: Temp: 97.8 F (36.6 C) (12/19 0616) Temp Source: Oral (12/19 0616) BP: 120/90 (12/19 0616) Pulse Rate: 78 (12/19 0616)  Labs:  Carbamazepine  Lvl 10.2 ug/mL      Monitoring carbamazepine  level per neuro request.  Indication: Bipolar and epilepsy goal level is 4-12 mcg/ml   Current regimen carbamazepine  200mg  po BID  Plan:  No change No additional levels indicated at this time.  Geovanna Simko Rodriguez-Guzman PharmD, BCPS 06/14/2024 1:36 PM        [1]  Allergies Allergen Reactions   Latex Rash and Swelling    Other reaction(s): Unknown Skin turns red   Morphine Other (See Comments) and Swelling    Patient states that her reaction is that her skin gets a bit red.   No urticaria or airway difficulty.     Prednisone Other (See Comments)    Other reaction(s): Other (See Comments) Stayed up 30 days when pt. Was on prednisone insomnia   Buprenorphine      Other Reaction(s): Not available   Buprenorphine  Hcl-Naloxone  Hcl    Duloxetine Other (See Comments)    Unknown Unknown   Fluoxetine     Other Reaction(s): Not available   Ketorolac Tromethamine    Lithium     drool   Ropinirole     Other Reaction(s): Not available   Tramadol     Other reaction(s): Other (See Comments) Seizures   Poison Ivy Extract [Poison [employer] Extract] Rash   Poison Oak Extract [Poison Oak Extract] Rash   Sumac Rash

## 2024-06-14 NOTE — Group Note (Signed)
 Date:  06/14/2024 Time:  9:05 PM  Group Topic/Focus:  Coping With Mental Health Crisis:   The purpose of this group is to help patients identify strategies for coping with mental health crisis.  Group discusses possible causes of crisis and ways to manage them effectively. Developing a Wellness Toolbox:   The focus of this group is to help patients develop a wellness toolbox with skills and strategies to promote recovery upon discharge.    Participation Level:  Active  Participation Quality:  Appropriate  Affect:  Appropriate  Cognitive:  Alert  Insight: Appropriate  Engagement in Group:  Engaged  Modes of Intervention:  Discussion and Education  Additional Comments:    Elona Yinger L 06/14/2024, 9:05 PM

## 2024-06-14 NOTE — Progress Notes (Signed)
" °   06/13/24 2055  Psych Admission Type (Psych Patients Only)  Admission Status Involuntary  Psychosocial Assessment  Patient Complaints Anxiety  Eye Contact Other (Comment) (appropriate)  Facial Expression Sad  Affect Sad  Speech Soft  Interaction Other (Comment) (appropriate)  Motor Activity Slow  Appearance/Hygiene Unremarkable  Behavior Characteristics Cooperative  Mood Pleasant  Aggressive Behavior  Effect No apparent injury  Thought Process  Coherency Circumstantial  Content WDL  Delusions WDL  Perception WDL  Hallucination None reported or observed  Judgment Poor  Confusion None  Danger to Self  Current suicidal ideation? Denies  Danger to Others  Danger to Others None reported or observed    "

## 2024-06-14 NOTE — Group Note (Signed)
 Recreation Therapy Group Note   Group Topic:Leisure Education  Group Date: 06/14/2024 Start Time: 1530 End Time: 1620 Facilitators: Celestia Jeoffrey FORBES ARTICE, CTRS Location: Craft Room  Group Description: Leisure. Patients were given the option to choose from journaling, coloring, drawing, making origami, playing with playdoh, listening to music or singing karaoke. LRT and pts discussed the meaning of leisure, the importance of participating in leisure during their free time/when they're outside of the hospital, as well as how our leisure interests can also serve as coping skills.   Goal Area(s) Addressed:  Patient will identify a current leisure interest.  Patient will learn the definition of leisure. Patient will practice making a positive decision. Patient will have the opportunity to try a new leisure activity. Patient will communicate with peers and LRT.   Affect/Mood: Appropriate   Participation Level: Active and Engaged   Participation Quality: Independent   Behavior: Calm and Cooperative   Speech/Thought Process: Coherent   Insight: Fair   Judgement: Fair    Modes of Intervention: Clarification, Education, Exploration, and Music   Patient Response to Interventions:  Attentive and Receptive   Education Outcome:  Acknowledges education   Clinical Observations/Individualized Feedback: [name] was active in their participation of session activities and group discussion. Pt identified pray and protect other people as things she does in her free time.    Plan: Continue to engage patient in RT group sessions 2-3x/week.   Jeoffrey FORBES Celestia, LRT, CTRS 06/14/2024 5:14 PM

## 2024-06-15 DIAGNOSIS — F319 Bipolar disorder, unspecified: Secondary | ICD-10-CM | POA: Diagnosis not present

## 2024-06-15 NOTE — Group Note (Signed)
 Date:  06/15/2024 Time:  8:52 PM  Group Topic/Focus:  Self Esteem Action Plan:   The focus of this group is to help patients create a plan to continue to build self-esteem after discharge.    Participation Level:  Active  Participation Quality:  Appropriate  Affect:  Appropriate  Cognitive:  Appropriate  Insight: Appropriate  Engagement in Group:  Engaged  Modes of Intervention:  Education  Additional Comments:    Abdulla Pooley L 06/15/2024, 8:52 PM

## 2024-06-15 NOTE — Group Note (Deleted)
 Date:  06/15/2024 Time:  8:44 PM  Group Topic/Focus:  Self Esteem Action Plan:   The focus of this group is to help patients create a plan to continue to build self-esteem after discharge.     Participation Level:  {BHH PARTICIPATION OZCZO:77735}  Participation Quality:  {BHH PARTICIPATION QUALITY:22265}  Affect:  {BHH AFFECT:22266}  Cognitive:  {BHH COGNITIVE:22267}  Insight: {BHH Insight2:20797}  Engagement in Group:  {BHH ENGAGEMENT IN HMNLE:77731}  Modes of Intervention:  {BHH MODES OF INTERVENTION:22269}  Additional Comments:  ***  [name] 06/15/2024, 8:44 PM

## 2024-06-15 NOTE — Plan of Care (Signed)
  Problem: Education: Goal: Mental status will improve Outcome: Progressing Goal: Verbalization of understanding the information provided will improve Outcome: Progressing   Problem: Activity: Goal: Interest or engagement in activities will improve Outcome: Progressing Goal: Sleeping patterns will improve Outcome: Progressing   Problem: Coping: Goal: Ability to verbalize frustrations and anger appropriately will improve Outcome: Progressing Goal: Ability to demonstrate self-control will improve Outcome: Progressing   Problem: Health Behavior/Discharge Planning: Goal: Compliance with treatment plan for underlying cause of condition will improve Outcome: Progressing   Problem: Education: Goal: Emotional status will improve Outcome: Not Progressing
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
## 2024-06-15 NOTE — Progress Notes (Signed)
" °   06/15/24 0915  Psych Admission Type (Psych Patients Only)  Admission Status Voluntary  Psychosocial Assessment  Patient Complaints None  Eye Contact Fair  Facial Expression Anxious  Affect Anxious  Speech Slow  Interaction Assertive  Motor Activity Slow  Appearance/Hygiene Improved  Behavior Characteristics Cooperative  Mood Pleasant  Aggressive Behavior  Effect No apparent injury  Thought Process  Coherency WDL  Content WDL  Delusions None reported or observed  Perception WDL  Hallucination None reported or observed  Judgment Impaired  Confusion WDL  Danger to Self  Current suicidal ideation? Denies  Danger to Others  Danger to Others None reported or observed    "

## 2024-06-15 NOTE — Group Note (Signed)
"                                                 [facility] LCSW Group Therapy Note    Group Date: 06/15/2024 Start Time: 1520 End Time: 1600  Type of Therapy and Topic:  Group Therapy:  Overcoming Obstacles  Participation Level:  [facility] PARTICIPATION LEVEL: Did Not Attend  Mood: Not able to assess ( pt did not attend).  Description of Group:   In this group patients will be encouraged to explore what they see as obstacles to their own wellness and recovery. They will be guided to discuss their thoughts, feelings, and behaviors related to these obstacles. The group will process together ways to cope with barriers, with attention given to specific choices patients can make. Each patient will be challenged to identify changes they are motivated to make in order to overcome their obstacles. This group will be process-oriented, with patients participating in exploration of their own experiences as well as giving and receiving support and challenge from other group members.  Therapeutic Goals: 1. Patient will identify personal and current obstacles as they relate to admission. 2. Patient will identify barriers that currently interfere with their wellness or overcoming obstacles.  3. Patient will identify feelings, thought process and behaviors related to these barriers. 4. Patient will identify two changes they are willing to make to overcome these obstacles:    Summary of Patient Progress Pt did not participate in group.     Therapeutic Modalities:   Cognitive Behavioral Therapy Solution Focused Therapy Motivational Interviewing Relapse Prevention Therapy   Rexene LELON Mae, LCSWA "

## 2024-06-15 NOTE — Progress Notes (Signed)
" °   06/14/24 2000  Psych Admission Type (Psych Patients Only)  Admission Status Involuntary  Psychosocial Assessment  Patient Complaints Anxiety  Eye Contact Fair  Facial Expression Flat  Affect Anxious  Speech Incoherent;Logical/coherent  Interaction Assertive  Motor Activity Slow  Appearance/Hygiene Improved  Behavior Characteristics Appropriate to situation;Cooperative  Mood Pleasant  Aggressive Behavior  Effect No apparent injury  Thought Process  Coherency WDL  Content WDL  Delusions WDL  Perception WDL  Hallucination None reported or observed  Judgment Poor  Confusion None  Danger to Self  Current suicidal ideation? Denies  Danger to Others  Danger to Others None reported or observed   No distress noted interacting appropriately denies SI/HI/AVH, thoughts are organized and coherent, 15 minutes safety checks maintained.  "

## 2024-06-15 NOTE — BH IP Treatment Plan (Signed)
 Interdisciplinary Treatment and Diagnostic Plan Update  06/15/2024 Time of Session: 2:30 PM  [name] MRN: [id number]  Principal Diagnosis: Bipolar disorder [facility])  Secondary Diagnoses: Principal Problem:   Bipolar disorder (HCC)   Current Medications:  Current Facility-Administered Medications  Medication Dose Route Frequency Provider Last Rate Last Admin   acetaminophen  (TYLENOL ) tablet 650 mg  650 mg Oral Q6H PRN Hampton, Tracie B, NP   650 mg at 06/15/24 1402   alum & mag hydroxide-simeth (MAALOX/MYLANTA) 200-200-20 MG/5ML suspension 30 mL  30 mL Oral Q4H PRN Hampton, Tracie B, NP       carbamazepine  (TEGRETOL ) tablet 200 mg  200 mg Oral BID Stack, Colleen M, MD   200 mg at 06/15/24 0802   clonazePAM  (KLONOPIN ) tablet 0.5 mg  0.5 mg Oral BID PRN May, Tanya, NP   0.5 mg at 06/15/24 0803   hydrOXYzine  (ATARAX ) tablet 25 mg  25 mg Oral TID PRN Hunter, Crystal L, PA-C       magnesium  hydroxide (MILK OF MAGNESIA) suspension 30 mL  30 mL Oral Daily PRN Hampton, Tracie B, NP       nicotine  (NICODERM CQ  - dosed in mg/24 hours) patch 21 mg  21 mg Transdermal Daily Hampton, Tracie B, NP   21 mg at 06/15/24 9194   OLANZapine  (ZYPREXA ) injection 5 mg  5 mg Intramuscular TID PRN Hampton, Tracie B, NP       OLANZapine  zydis (ZYPREXA ) disintegrating tablet 10 mg  10 mg Oral QHS May, Tanya, NP       OLANZapine  zydis (ZYPREXA ) disintegrating tablet 5 mg  5 mg Oral TID PRN Hampton, Tracie B, NP       sertraline  (ZOLOFT ) tablet 50 mg  50 mg Oral Daily Hunter, Crystal L, PA-C   50 mg at 06/14/24 1451   sodium chloride  (OCEAN) 0.65 % nasal spray 1 spray  1 spray Each Nare PRN Donnelly Mellow, MD   1 spray at 06/15/24 1127   traZODone  (DESYREL ) tablet 50 mg  50 mg Oral QHS PRN Hampton, Tracie B, NP   50 mg at 06/13/24 2052   PTA Medications: Medications Prior to Admission  Medication Sig Dispense Refill Last Dose/Taking   hydrOXYzine  (ATARAX ) 25 MG tablet Take 1 tablet (25 mg total) by  mouth 3 (three) times daily as needed for anxiety (Sleep). (Patient not taking: Reported on 02/24/2023) 30 tablet 0    lacosamide  (VIMPAT ) 50 MG TABS tablet Take 1 tablet (50 mg total) by mouth 2 (two) times daily. (Patient not taking: Reported on 12/21/2022) 60 tablet 0    levETIRAcetam  (KEPPRA ) 1000 MG tablet Take 1,000 mg by mouth 2 (two) times daily. (Patient not taking: Reported on 02/24/2023)      nicotine  (NICODERM CQ  - DOSED IN MG/24 HOURS) 21 mg/24hr patch Place 1 patch (21 mg total) onto the skin daily. (Patient not taking: Reported on 06/08/2024) 28 patch 0    PARoxetine  (PAXIL ) 20 MG tablet Take 20 mg by mouth daily. (Patient not taking: Reported on 06/08/2024)      traZODone  (DESYREL ) 50 MG tablet Take 1 tablet (50 mg total) by mouth at bedtime as needed for sleep. (Patient not taking: Reported on 06/08/2024) 30 tablet 0     Patient Stressors: Medication change or noncompliance    Patient Strengths: Average or above average intelligence  Communication skills   Treatment Modalities: Medication Management, Group therapy, Case management,  1 to 1 session with clinician, Psychoeducation, Recreational therapy.   Physician Treatment Plan  for Primary Diagnosis: Bipolar disorder (HCC) Long Term Goal(s): Improvement in symptoms so as ready for discharge   Short Term Goals: Ability to identify changes in lifestyle to reduce recurrence of condition will improve Ability to verbalize feelings will improve Ability to disclose and discuss suicidal ideas Ability to maintain clinical measurements within normal limits will improve Compliance with prescribed medications will improve Ability to identify triggers associated with substance abuse/mental health issues will improve  Medication Management: Evaluate patient's response, side effects, and tolerance of medication regimen.  Therapeutic Interventions: 1 to 1 sessions, Unit Group sessions and Medication administration.  Evaluation of  Outcomes: Progressing  Physician Treatment Plan for Secondary Diagnosis: Principal Problem:   Bipolar disorder (HCC)  Long Term Goal(s): Improvement in symptoms so as ready for discharge   Short Term Goals: Ability to identify changes in lifestyle to reduce recurrence of condition will improve Ability to verbalize feelings will improve Ability to disclose and discuss suicidal ideas Ability to maintain clinical measurements within normal limits will improve Compliance with prescribed medications will improve Ability to identify triggers associated with substance abuse/mental health issues will improve     Medication Management: Evaluate patient's response, side effects, and tolerance of medication regimen.  Therapeutic Interventions: 1 to 1 sessions, Unit Group sessions and Medication administration.  Evaluation of Outcomes: Progressing   RN Treatment Plan for Primary Diagnosis: Bipolar disorder (HCC) Long Term Goal(s): Knowledge of disease and therapeutic regimen to maintain health will improve  Short Term Goals: Ability to remain free from injury will improve, Ability to verbalize frustration and anger appropriately will improve, Ability to demonstrate self-control, Ability to participate in decision making will improve, Ability to verbalize feelings will improve, Ability to disclose and discuss suicidal ideas, Ability to identify and develop effective coping behaviors will improve, and Compliance with prescribed medications will improve  Medication Management: RN will administer medications as ordered by provider, will assess and evaluate patient's response and provide education to patient for prescribed medication. RN will report any adverse and/or side effects to prescribing provider.  Therapeutic Interventions: 1 on 1 counseling sessions, Psychoeducation, Medication administration, Evaluate responses to treatment, Monitor vital signs and CBGs as ordered, Perform/monitor CIWA, COWS,  AIMS and Fall Risk screenings as ordered, Perform wound care treatments as ordered.  Evaluation of Outcomes: Progressing   LCSW Treatment Plan for Primary Diagnosis: Bipolar disorder (HCC) Long Term Goal(s): Safe transition to appropriate next level of care at discharge, Engage patient in therapeutic group addressing interpersonal concerns.  Short Term Goals: Engage patient in aftercare planning with referrals and resources, Increase social support, Increase ability to appropriately verbalize feelings, Increase emotional regulation, Facilitate acceptance of mental health diagnosis and concerns, Facilitate patient progression through stages of change regarding substance use diagnoses and concerns, Identify triggers associated with mental health/substance abuse issues, and Increase skills for wellness and recovery  Therapeutic Interventions: Assess for all discharge needs, 1 to 1 time with Social worker, Explore available resources and support systems, Assess for adequacy in community support network, Educate family and significant other(s) on suicide prevention, Complete Psychosocial Assessment, Interpersonal group therapy.  Evaluation of Outcomes: Progressing   Progress in Treatment: Attending groups: Yes. and No. Participating in groups: Yes. and No. Taking medication as prescribed: Yes. Toleration medication: Yes. Family/Significant other contact made: Yes, individual(s) contacted:  father, [name].  Patient understands diagnosis: No. Discussing patient identified problems/goals with staff: No. Medical problems stabilized or resolved: Yes. Denies suicidal/homicidal ideation: No. Issues/concerns per patient self-inventory: No. Other: none.  New problem(s) identified: No, Describe:  none identified.  Update 06/15/24: No changes at this time   New Short Term/Long Term Goal(s): elimination of symptoms of psychosis, medication management for mood stabilization; elimination of SI  thoughts; development of comprehensive mental wellness/sobriety plan. Update 06/15/24: No changes at this time     Patient Goals:  Patient was unable to identify goal for treatment. Update 06/15/24: No changes at this time     Discharge Plan or Barriers: CSW will assist pt with development of an appropriate aftercare/discharge plan. Update 06/15/24: No changes at this time     Reason for Continuation of Hospitalization: Medication stabilization Suicidal ideation Other; describe insomnia   Estimated Length of Stay: 1-7 days Update 06/15/24: TBD    Last 3 [place] Suicide Severity Risk Score: Flowsheet Row Admission (Current) from 06/08/2024 in [facility] INPATIENT BEHAVIORAL MEDICINE Most recent reading at 06/09/2024  5:48 AM ED from 06/08/2024 in [facility] Emergency Department at [facility] Most recent reading at 06/08/2024  2:59 PM ED from 01/09/2024 in [facility] Emergency Department at [facility] Most recent reading at 01/09/2024  2:47 PM  C-SSRS RISK CATEGORY Low Risk Low Risk No Risk    Last PHQ 2/9 Scores:     No data to display          Scribe for Treatment Team: Lum JONETTA Croft, ISRAEL 06/15/2024 2:59 PM

## 2024-06-15 NOTE — Group Note (Signed)
 Date:  06/15/2024 Time:  10:09 AM  Group Topic/Focus:  Managing Feelings:   The focus of this group is to identify what feelings patients have difficulty handling and develop a plan to handle them in a healthier way upon discharge.    Participation Level:  Active  Participation Quality:  Appropriate  Affect:  Appropriate  Cognitive:  Appropriate  Insight: Appropriate  Engagement in Group:  Engaged  Modes of Intervention:  Activity  Additional Comments:    Camellia HERO Delaine Hernandez 06/15/2024, 10:09 AM

## 2024-06-16 DIAGNOSIS — F319 Bipolar disorder, unspecified: Secondary | ICD-10-CM | POA: Diagnosis not present

## 2024-06-16 MED ORDER — OLANZAPINE 5 MG PO TBDP
15.0000 mg | ORAL_TABLET | Freq: Every day | ORAL | Status: DC
  Administered 2024-06-16 – 2024-06-17 (×2): 15 mg via ORAL
  Filled 2024-06-16 (×3): qty 1

## 2024-06-16 NOTE — Group Note (Signed)
 Date:  06/16/2024 Time:  10:44 AM  Group Topic/Focus:  Building Self Esteem:   The Focus of this group is helping patients become aware of the effects of self-esteem on their lives, the things they and others do that enhance or undermine their self-esteem, seeing the relationship between their level of self-esteem and the choices they make and learning ways to enhance self-esteem.    Participation Level:  Did Not Attend   [name] June 06/16/2024, 10:44 AM
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
## 2024-06-16 NOTE — Progress Notes (Signed)
" °   06/16/24 0247  Psych Admission Type (Psych Patients Only)  Admission Status Voluntary  Psychosocial Assessment  Patient Complaints None  Eye Contact Fair  Facial Expression Anxious  Affect Anxious  Speech Slow  Interaction Assertive  Motor Activity Slow  Appearance/Hygiene Improved  Behavior Characteristics Cooperative;Appropriate to situation  Mood Labile  Thought Process  Coherency WDL  Content WDL  Delusions None reported or observed  Perception WDL  Hallucination None reported or observed  Judgment WDL  Confusion WDL   Patient alert and oriented x 3, affect is flat she denies SI/HI/AVH interating approporiately with peers and staff. Patient's thoughts are organized and coherent. 15 minutes safety checks maintained will continue to monitor.  "

## 2024-06-16 NOTE — Progress Notes (Signed)
" °   06/16/24 0805  Psych Admission Type (Psych Patients Only)  Admission Status Voluntary  Psychosocial Assessment  Patient Complaints Anxiety  Eye Contact Fair  Facial Expression Anxious  Affect Anxious  Speech Slow  Interaction Assertive  Motor Activity Slow  Appearance/Hygiene Unremarkable  Behavior Characteristics Cooperative  Mood Labile  Aggressive Behavior  Effect No apparent injury  Thought Process  Coherency WDL  Content WDL  Delusions None reported or observed  Perception WDL  Hallucination None reported or observed  Judgment Impaired  Confusion WDL  Danger to Self  Current suicidal ideation? Denies  Danger to Others  Danger to Others None reported or observed    "

## 2024-06-16 NOTE — Plan of Care (Signed)

## 2024-06-16 NOTE — Plan of Care (Signed)
   Problem: Education: Goal: Knowledge of [facility] Education information/materials will improve Outcome: Progressing   Problem: Education: Goal: Emotional status will improve Outcome: Progressing   Problem: Education: Goal: Mental status will improve Outcome: Progressing   Problem: Education: Goal: Verbalization of understanding the information provided will improve Outcome: Progressing

## 2024-06-17 NOTE — Group Note (Signed)
 Date:  06/17/2024 Time:  9:45 PM  Group Topic/Focus:  Wrap-Up Group:   The focus of this group is to help patients review their daily goal of treatment and discuss progress on daily workbooks.    Participation Level:  Did Not Attend   [name] 06/17/2024, 9:45 PM

## 2024-06-17 NOTE — Plan of Care (Signed)
  Problem: Education: Goal: Mental status will improve Outcome: Progressing Goal: Verbalization of understanding the information provided will improve Outcome: Progressing   Problem: Activity: Goal: Interest or engagement in activities will improve Outcome: Progressing Goal: Sleeping patterns will improve Outcome: Progressing   Problem: Coping: Goal: Ability to verbalize frustrations and anger appropriately will improve Outcome: Progressing Goal: Ability to demonstrate self-control will improve Outcome: Progressing   Problem: Health Behavior/Discharge Planning: Goal: Compliance with treatment plan for underlying cause of condition will improve Outcome: Progressing   Problem: Education: Goal: Emotional status will improve Outcome: Not Progressing

## 2024-06-17 NOTE — Progress Notes (Signed)
" °   06/17/24 0908  Psych Admission Type (Psych Patients Only)  Admission Status Voluntary  Psychosocial Assessment  Patient Complaints Anxiety  Eye Contact Fair  Facial Expression Anxious  Affect Anxious  Speech Slow  Interaction Assertive  Motor Activity Slow  Appearance/Hygiene Unremarkable  Behavior Characteristics Cooperative;Anxious  Mood Labile  Aggressive Behavior  Effect No apparent injury  Thought Process  Coherency WDL  Content WDL  Delusions None reported or observed  Perception WDL  Hallucination None reported or observed  Judgment WDL  Confusion WDL  Danger to Self  Current suicidal ideation? Denies  Danger to Others  Danger to Others None reported or observed    "

## 2024-06-17 NOTE — Progress Notes (Signed)
 Pt calm and pleasant during assessment denying SI/HI/AVH. Pt observed by this Clinical research associate interacting appropriately with staff and peers on the unit. Pt compliant with medication administration per MD orders. Pt given education, support, and encouragement to be active in her treatment plan. Pt being monitored Q 15 minutes for safety per unit protocol, remains safe on the unit

## 2024-06-17 NOTE — Group Note (Signed)
 Recreation Therapy Group Note   Group Topic:Coping Skills  Group Date: 06/17/2024 Start Time: 1515 End Time: 1550 Facilitators: Celestia Jeoffrey FORBES ARTICE, CTRS Location: Craft Room  Group Description: Mind Map.  Patient was provided a blank template of a diagram with 32 blank boxes in a tiered system, branching from the center (similar to a bubble chart). LRT directed patients to label the middle of the diagram Coping Skills. LRT and patients then came up with 8 different coping skills as examples. Pt were directed to record their coping skills in the 2nd tier boxes closest to the center.  Patients would then share their coping skills with the group as LRT wrote them out. LRT gave a handout of 99 different coping skills at the end of group.   Goal Area(s) Addressed: Patients will be able to define coping skills. Patient will identify new coping skills.  Patient will increase communication.   Affect/Mood: Appropriate   Participation Level: Active   Participation Quality: Independent   Behavior: Calm and Cooperative   Speech/Thought Process: Loose association   Insight: Fair   Judgement: Fair    Modes of Intervention: Clarification, Education, Worksheet, and Writing   Patient Response to Interventions:  Receptive   Education Outcome:  In group clarification offered    Clinical Observations/Individualized Feedback: Izetta was active in their participation of session activities and group discussion. Pt identified prayer and art as coping skills.    Plan: Continue to engage patient in RT group sessions 2-3x/week.   Jeoffrey FORBES Celestia, LRT, CTRS 06/17/2024 5:08 PM

## 2024-06-17 NOTE — Plan of Care (Signed)
  Problem: Education: Goal: Knowledge of [facility] Education information/materials will improve Outcome: Progressing   Problem: Education: Goal: Emotional status will improve Outcome: Progressing   Problem: Education: Goal: Mental status will improve Outcome: Progressing   Problem: Education: Goal: Verbalization of understanding the information provided will improve Outcome: Progressing   Problem: Activity: Goal: Interest or engagement in activities will improve Outcome: Progressing   

## 2024-06-17 NOTE — Progress Notes (Signed)
" °   06/16/24 2000  Psych Admission Type (Psych Patients Only)  Admission Status Voluntary  Psychosocial Assessment  Patient Complaints Anxiety  Eye Contact Fair  Facial Expression Anxious  Affect Angry  Speech Slow  Interaction Assertive  Motor Activity Slow  Appearance/Hygiene Improved  Behavior Characteristics Cooperative  Mood Labile  Aggressive Behavior  Effect No apparent injury  Thought Process  Coherency WDL  Content WDL  Delusions None reported or observed  Perception WDL  Hallucination None reported or observed  Judgment Impaired  Confusion WDL  Danger to Self  Current suicidal ideation? Denies  Danger to Others  Danger to Others None reported or observed   Patient is alert and oriented x 4, thoughts are organized and coherent, she denies SI/HI/AVH, affect is congruent with mood, interacting appropriately with peers and staff. 15 minutes safety checks maintained.  "

## 2024-06-17 NOTE — Group Note (Signed)
"                                                 [facility] LCSW Group Therapy Note    Group Date: 06/17/2024 Start Time: 1300 End Time: 1400  Type of Therapy and Topic:  Group Therapy:  Overcoming Obstacles  Participation Level:  [facility] PARTICIPATION LEVEL: Minimal  Description of Group:   In this group patients will be encouraged to explore what they see as obstacles to their own wellness and recovery. They will be guided to discuss their thoughts, feelings, and behaviors related to these obstacles. The group will process together ways to cope with barriers, with attention given to specific choices patients can make. Each patient will be challenged to identify changes they are motivated to make in order to overcome their obstacles. This group will be process-oriented, with patients participating in exploration of their own experiences as well as giving and receiving support and challenge from other group members.  Therapeutic Goals: 1. Patient will identify personal and current obstacles as they relate to admission. 2. Patient will identify barriers that currently interfere with their wellness or overcoming obstacles.  3. Patient will identify feelings, thought process and behaviors related to these barriers. 4. Patient will identify two changes they are willing to make to overcome these obstacles:    Summary of Patient Progress Patient was present for the entirety of the group process. She was minimally engaged in the discussion. Her comments were at times off topic. Insight into topic remains questionable. She appeared open and receptive to feedback/comments from both her peers and the facilitator.  Therapeutic Modalities:   Cognitive Behavioral Therapy Solution Focused Therapy Motivational Interviewing Relapse Prevention Therapy   Nadara JONELLE Fam, LCSW "

## 2024-06-17 NOTE — Group Note (Signed)
 Date:  06/17/2024 Time:  12:12 PM  Group Topic/Focus:  Goals Group:   The focus of this group is to help patients establish daily goals to achieve during treatment and discuss how the patient can incorporate goal setting into their daily lives to aide in recovery. We also went over positive thinking and redirecting negative thoughts to positive ones, while making a list of positive what if's to help positive redirection.   Participation Level:  Minimal  Participation Quality:  Appropriate  Affect:  Flat  Cognitive:  Lacking  Insight: Limited  Engagement in Group:  Limited  Modes of Intervention:  Activity  Additional Comments:    Leigh VEAR Pais 06/17/2024, 12:12 PM

## 2024-06-18 ENCOUNTER — Other Ambulatory Visit: Payer: Self-pay

## 2024-06-18 DIAGNOSIS — F3160 Bipolar disorder, current episode mixed, unspecified: Secondary | ICD-10-CM | POA: Insufficient documentation

## 2024-06-18 DIAGNOSIS — F319 Bipolar disorder, unspecified: Secondary | ICD-10-CM | POA: Diagnosis not present

## 2024-06-18 MED ORDER — CARBAMAZEPINE 200 MG PO TABS
200.0000 mg | ORAL_TABLET | Freq: Two times a day (BID) | ORAL | 0 refills | Status: AC
  Filled 2024-06-18: qty 60, 30d supply, fill #0

## 2024-06-18 MED ORDER — OLANZAPINE 15 MG PO TABS
15.0000 mg | ORAL_TABLET | Freq: Every day | ORAL | 0 refills | Status: AC
  Filled 2024-06-18: qty 30, 30d supply, fill #0

## 2024-06-18 MED ORDER — CLONAZEPAM 0.5 MG PO TABS
0.5000 mg | ORAL_TABLET | Freq: Two times a day (BID) | ORAL | 0 refills | Status: AC | PRN
  Filled 2024-06-18: qty 30, 15d supply, fill #0

## 2024-06-18 MED ORDER — TRAZODONE HCL 50 MG PO TABS
50.0000 mg | ORAL_TABLET | Freq: Every evening | ORAL | 0 refills | Status: AC | PRN
  Filled 2024-06-18: qty 30, 30d supply, fill #0

## 2024-06-18 MED ORDER — SERTRALINE HCL 50 MG PO TABS
50.0000 mg | ORAL_TABLET | Freq: Every day | ORAL | 0 refills | Status: AC
  Filled 2024-06-18: qty 30, 30d supply, fill #0

## 2024-06-18 NOTE — Plan of Care (Signed)
   Problem: Education: Goal: Emotional status will improve Outcome: Progressing Goal: Mental status will improve Outcome: Progressing

## 2024-06-18 NOTE — Progress Notes (Signed)
" °  [facility] Adult Case Management Discharge Plan :  Will you be returning to the same living situation after discharge:  Yes,  pt reports that she will return home.  At discharge, do you have transportation home?: Yes,  CSW to assist with transportation needs.  Do you have the ability to pay for your medications: Yes,  [employer] MEDICARE / DREMA DUAL COMPLETE  Release of information consent forms completed and in the chart;  Patient's signature needed at discharge.  Patient to Follow up at:  Follow-up Information     [facility] Follow up.   Why: Appointment is scheduled for 06/24/24 at 10:30am. Contact information: [address]  [address] [place] [address] [phone]                 Next level of care provider has access to [facility] Link:no  Safety Planning and Suicide Prevention discussed: Yes,  SPE completed with the patient's husband.      Has patient been referred to the Quitline?: Patient refused referral for treatment  Patient has been referred for addiction treatment: Yes, the patient will follow up with an outpatient provider for substance use disorder. Psychiatrist/APP: appointment made  Sherryle JINNY Margo, LCSW 06/18/2024, 9:44 AM "
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
## 2024-06-18 NOTE — Care Management Important Message (Signed)
 Important Message  Patient Details  Name: Salene Mohamud MRN: [id number] Date of Birth: [birth year]   Important Message Given:  Yes - Medicare IM     Sherryle JINNY Margo, LCSW 06/18/2024, 9:47 AM

## 2024-06-18 NOTE — Group Note (Signed)
 LCSW Group Therapy Note  Group Date: 06/18/2024 Start Time: 1300 End Time: 1400   Type of Therapy and Topic:  Group Therapy: Anger Cues and Responses  Participation Level:  Minimal   Description of Group:   In this group, patients learned how to recognize the physical, cognitive, emotional, and behavioral responses they have to anger-provoking situations.  They identified a recent time they became angry and how they reacted.  They analyzed how their reaction was possibly beneficial and how it was possibly unhelpful.  The group discussed a variety of healthier coping skills that could help with such a situation in the future.  Focus was placed on how helpful it is to recognize the underlying emotions to our anger, because working on those can lead to a more permanent solution as well as our ability to focus on the important rather than the urgent.  Therapeutic Goals: Patients will remember their last incident of anger and how they felt emotionally and physically, what their thoughts were at the time, and how they behaved. Patients will identify how their behavior at that time worked for them, as well as how it worked against them. Patients will explore possible new behaviors to use in future anger situations. Patients will learn that anger itself is normal and cannot be eliminated, and that healthier reactions can assist with resolving conflict rather than worsening situations.  Summary of Patient Progress:   She was active during the group. She shared a recent occurrence wherein feeling taken advantage of led to anger. She demonstrated poor insight into the subject matter, was respectful of peers, and participated throughout the entire session.  Therapeutic Modalities:   Cognitive Behavioral Therapy    Sherryle JINNY Margo, LCSW 06/18/2024  3:20 PM

## 2024-06-18 NOTE — Group Note (Signed)
 Recreation Therapy Group Note   Group Topic:Healthy Support Systems  Group Date: 06/18/2024 Start Time: 1010 End Time: 1050 Facilitators: Celestia Jeoffrey BRAVO, LRT, CTRS Location: Craft Room  Group Description: Straw Bridge. In groups or individually, patients were given 10 plastic drinking straws and an equal length of masking tape. Using the materials provided, patients were instructed to build a free-standing bridge-like structure to suspend an everyday item (ex: deck of cards) off the floor or table surface. All materials were required to be used in secondary school teacher. LRT facilitated post-activity discussion reviewing the importance of having strong and healthy support systems in our lives. LRT discussed how the people in our lives serve as the tape and the deck of cards we placed on top of our straw structure are the stressors we face in daily life. LRT and pts discussed what happens in our life when things get too heavy for us , and we don't have strong supports outside of the hospital. Pt shared 2 of their healthy supports in their life aloud in the group.   Goal Area(s) Addressed:  Patient will identify 2 healthy supports in their life. Patient will identify skills to successfully complete activity. Patient will identify correlation of this activity to life post-discharge.  Patient will build on frustration tolerance skills. Patient will increase team building and communication skills.   Affect/Mood: Appropriate   Participation Level: Active and Engaged   Participation Quality: Independent   Behavior: Calm and Cooperative   Speech/Thought Process: Coherent   Insight: Fair   Judgement: Fair    Modes of Intervention: STEM Activity   Patient Response to Interventions:  Receptive   Education Outcome:  In group clarification offered    Clinical Observations/Individualized Feedback: [name] was active in their participation of session activities and group discussion. Pt identified my  dad and my little girl as healthy supports.    Plan: Continue to engage patient in RT group sessions 2-3x/week.   Jeoffrey BRAVO Celestia, LRT, CTRS 06/18/2024 11:04 AM

## 2024-06-18 NOTE — BHH Suicide Risk Assessment (Signed)
 [facility] Discharge Suicide Risk Assessment   Principal Problem: Bipolar disorder [employer]) Discharge Diagnoses: Principal Problem:   Bipolar disorder (HCC)   Total Time spent with patient: 30 minutes  Musculoskeletal: Strength & Muscle Tone: within normal limits Gait & Station: normal Patient leans: N/A  Psychiatric Specialty Exam  Presentation  General Appearance:  Appropriate for Environment  Eye Contact: Fair  Speech: Clear and Coherent  Speech Volume: Normal  Handedness:Right   Mood and Affect  Mood: Euthymic  Duration of Depression Symptoms: N/A  Affect: Appropriate   Thought Process  Thought Processes: Coherent  Descriptions of Associations:Intact  Orientation:Full (Time, Place and Person)  Thought Content:Logical  History of Schizophrenia/Schizoaffective disorder:No  Duration of Psychotic Symptoms:No data recorded Hallucinations:Hallucinations: None  Ideas of Reference:None  Suicidal Thoughts:Suicidal Thoughts: No  Homicidal Thoughts:Homicidal Thoughts: No   Sensorium  Memory: Immediate Fair  Judgment: Fair  Insight: Fair   Art Therapist  Concentration: Fair  Attention Span: Fair  Recall: Fair  Fund of Knowledge: Fair  Language: Fair   Psychomotor Activity  Psychomotor Activity:Psychomotor Activity: Normal   Assets  Assets: Communication Skills; Desire for Improvement   Sleep  Sleep:Sleep: Fair  Estimated Sleeping Duration (Last 24 Hours): 10.00-11.75 hours  Physical Exam: Physical Exam ROS Blood pressure (!) 125/99, pulse 100, temperature 98.1 F (36.7 C), temperature source Oral, resp. rate 16, height 5' 1 (1.549 m), weight 64 kg, last menstrual period 06/05/2024, SpO2 100%. Body mass index is 26.67 kg/m.  Mental Status Per Nursing Assessment::   On Admission:  Suicidal ideation indicated by patient  Demographic Factors:  Low socioeconomic status  Loss Factors: Decrease in vocational  status  Historical Factors: Impulsivity  Risk Reduction Factors:   Living with another person, especially a relative, Positive social support, Positive therapeutic relationship, and Positive coping skills or problem solving skills  Continued Clinical Symptoms:  Depression:   Impulsivity  Cognitive Features That Contribute To Risk:  None    Suicide Risk:  Minimal: No identifiable suicidal ideation.  Patients presenting with no risk factors but with morbid ruminations; may be classified as minimal risk based on the severity of the depressive symptoms   Follow-up Information     [facility] Follow up.   Why: Appointment is scheduled for 06/24/24 at 10:30am. Contact information: [address]  [address] [place] [address] [phone]                 Plan Of Care/Follow-up recommendations:  Activity:  as tolerated  Allyn Foil, MD 06/18/2024, 10:48 AM

## 2024-06-18 NOTE — Progress Notes (Signed)
Patient pleasant and cooperative on approach. Denies SI,HI and AVH. Verbalized understanding discharge instructions,prescriptions and follow up care. 30 days medicines given to patient. All belongings returned from [employer]. Suicide safety plan filled by patient and placed in chart. Copy given to patient.Patient escorted out by staff and transported by family.

## 2024-06-18 NOTE — Discharge Summary (Signed)
 " Physician Discharge Summary Note  Patient:  [name] is an 43 y.o., female MRN:  [id number] DOB:  [birth year] Patient phone:  [phone] (home)  Patient address:   [address] [place] [address],   Total time spent: 40 min Date of Admission:  06/08/2024 Date of Discharge: 06/18/2024  Reason for Admission:  Patient is a 43 year old female with a pphx of Bipolar disorder, Paranoia, psychosis, and catatonia who presented voluntarily to the ED for insomnia and suicidal ideation.    On evaluation, patient presentation bizarre and disheveled. She is wrapped in a blanket on her bed in the dark with a mask pulled below her nose and only over her mouth. Patient responses are minimal, intermittent, and delayed. Patient appears to have though blocking.    Patient answered that she was doing good. She would answer yes/no intermittently to some questions. After about 5 questions patient stopped responding to clinical research associate, but stared at [employer] electric. Interview was concluded due to patient being unable to engage.  Principal Problem: Bipolar disorder [employer]) Discharge Diagnoses: Principal Problem:   Bipolar disorder (HCC)   Past Psychiatric History: see h&p  Family Psychiatric  History: see h&p Social History:  Social History   Substance and Sexual Activity  Alcohol Use No     Social History   Substance and Sexual Activity  Drug Use Yes   Types: Marijuana   Comment: reports daily cannabis use    Social History   Socioeconomic History   Marital status: Divorced    Spouse name: Not on file   Number of children: Not on file   Years of education: Not on file   Highest education level: Not on file  Occupational History   Occupation: unemployed  Tobacco Use   Smoking status: Every Day    Current packs/day: 1.50    Types: Cigarettes   Smokeless tobacco: Never  Vaping Use   Vaping status: Never Used  Substance and Sexual Activity   Alcohol use: No   Drug use: Yes     Types: Marijuana    Comment: reports daily cannabis use   Sexual activity: Yes    Partners: Male  Other Topics Concern   Not on file  Social History Narrative   Not on file   Social Drivers of Health   Tobacco Use: High Risk (06/09/2024)   Patient History    Smoking Tobacco Use: Every Day    Smokeless Tobacco Use: Never    Passive Exposure: Not on file  Financial Resource Strain: High Risk (12/13/2023)   Received from [facility]   Overall Financial Resource Strain (CARDIA)    Difficulty of Paying Living Expenses: Hard  Food Insecurity: No Food Insecurity (06/09/2024)   Epic    Worried About Radiation Protection Practitioner of Food in the Last Year: Never true    Ran Out of Food in the Last Year: Never true  Transportation Needs: No Transportation Needs (06/09/2024)   Epic    Lack of Transportation (Medical): No    Lack of Transportation (Non-Medical): No  Physical Activity: Not on file  Stress: Not on file  Social Connections: Not on file  Depression (EYV7-0): Not on file  Alcohol Screen: Low Risk (06/09/2024)   Alcohol Screen    Last Alcohol Screening Score (AUDIT): 0  Housing: Low Risk (06/09/2024)   Epic    Unable to Pay for Housing in the Last Year: No    Number of Times Moved in the Last  Year: 0    Homeless in the Last Year: No  Utilities: Not At Risk (06/09/2024)   Epic    Threatened with loss of utilities: No  Health Literacy: Not on file   Past Medical History:  Past Medical History:  Diagnosis Date   Degeneration of lumbar intervertebral disc    Depression    DVT (deep vein thrombosis) in pregnancy    Schizoaffective disorder (HCC)    Seizures (HCC)    Shoulder pain, right     Past Surgical History:  Procedure Laterality Date   CESAREAN SECTION     X2   Family History:  Family History  Problem Relation Age of Onset   Cancer Mother     Hospital Course:  Patient is a 43 year old female with a pphx of Bipolar disorder, Paranoia, psychosis, and  catatonia who presented voluntarily to the ED for insomnia and suicidal ideation.    On evaluation, patient presentation bizarre and disheveled. She is wrapped in a blanket on her bed in the dark with a mask pulled below her nose and only over her mouth. Patient responses are minimal, intermittent, and delayed. Patient appears to have though blocking.    Patient answered that she was doing good. She would answer yes/no intermittently to some questions. After about 5 questions patient stopped responding to clinical research associate, but stared at emerson electric. Interview was concluded due to patient being unable to engage.Patient is admitted to adult psych unit with Q15 min safety monitoring. Multidisciplinary team approach is offered. Medication management; group/milieu therapy is offered.   On admission, patient was started on Zyprexa  and the dosage was manage to 50 mg nightly to avoid daytime sedation.  Zoloft  50 mg was maintained to help with depression and anxiety.  Patient was initially on Klonopin  0.5 mg twice daily which was adding to her daytime sedation and was made as as needed medication.  Patient managed and tolerated medication adjustments fairly well with no reported side effects.  Patient participated in treatment team in groups and got along very well with the peers and staff members.  Patient displayed significant improvement in their mood including depression and anxiety and was not responding to internal stimuli.  Detailed risk assessment is complete based on clinical exam and individual risk factors and acute suicide risk is low and acute violence risk is low.    On the day of discharge, patient denies SI/HI/plan and denies hallucinations.  Patient remains future oriented and is willing to participate in outpatient mental health services.  Currently, all modifiable risk of harm to self/harm to others have been addressed and patient is no longer appropriate for the acute inpatient setting and is able to  continue treatment for mental health needs in the community with the supports as indicated below.  Patient is educated and verbalized understanding of discharge plan of care including medications, follow-up appointments, mental health resources and further crisis services in the community.  He is instructed to call 911 or present to the nearest emergency room should he experience any decompensation in mood, disturbance of bowel or return of suicidal/homicidal ideations.  Patient verbalizes understanding of this education and agrees to this plan of care  Physical Findings: AIMS:  , ,  ,  ,    CIWA:    COWS:      Psychiatric Specialty Exam:  Presentation  General Appearance:  Appropriate for Environment  Eye Contact: Fair  Speech: Clear and Coherent  Speech Volume: Normal    Mood and  Affect  Mood: Euthymic  Affect: Appropriate   Thought Process  Thought Processes: Coherent  Descriptions of Associations:Intact  Orientation:Full (Time, Place and Person)  Thought Content:Logical  Hallucinations:Hallucinations: None  Ideas of Reference:None  Suicidal Thoughts:Suicidal Thoughts: No  Homicidal Thoughts:Homicidal Thoughts: No   Sensorium  Memory: Immediate Fair  Judgment: Fair  Insight: Fair   Art Therapist  Concentration: Fair  Attention Span: Fair  Recall: Fair  Fund of Knowledge: Fair  Language: Fair   Psychomotor Activity  Psychomotor Activity: Psychomotor Activity: Normal  Musculoskeletal: Strength & Muscle Tone: within normal limits Gait & Station: normal Assets  Assets: Manufacturing Systems Engineer; Desire for Improvement   Sleep  Sleep: Sleep: Fair    Physical Exam: Physical Exam ROS Blood pressure (!) 125/99, pulse 100, temperature 98.1 F (36.7 C), temperature source Oral, resp. rate 16, height 5' 1 (1.549 m), weight 64 kg, last menstrual period 06/05/2024, SpO2 100%. Body mass index is 26.67 kg/m.   Tobacco Use  History[1] Tobacco Cessation:  A prescription for an FDA-approved tobacco cessation medication provided at discharge   Blood Alcohol level:  Lab Results  Component Value Date   [facility] <15 06/08/2024   ETH <10 02/24/2023    Metabolic Disorder Labs:  Lab Results  Component Value Date   HGBA1C 5.0 06/11/2024   MPG 96.8 06/11/2024   MPG 102.54 08/02/2022   No results found for: PROLACTIN Lab Results  Component Value Date   CHOL 143 06/11/2024   TRIG 91 06/11/2024   HDL 48 06/11/2024   CHOLHDL 3.0 06/11/2024   VLDL 18 06/11/2024   LDLCALC 77 06/11/2024   LDLCALC 101 (H) 08/02/2022    See Psychiatric Specialty Exam and Suicide Risk Assessment completed by Attending Physician prior to discharge.  Discharge destination:  Home  Is patient on multiple antipsychotic therapies at discharge:  No   Has Patient had three or more failed trials of antipsychotic monotherapy by history:  No  Recommended Plan for Multiple Antipsychotic Therapies: NA  Discharge Instructions     Ambulatory referral to Neurology   Complete by: As directed       Allergies as of 06/18/2024       Reactions   Latex Rash, Swelling   Other reaction(s): Unknown Skin turns red   Morphine Other (See Comments), Swelling   Patient states that her reaction is that her skin gets a bit red.   No urticaria or airway difficulty.     Prednisone Other (See Comments)   Other reaction(s): Other (See Comments) Stayed up 30 days when pt. Was on prednisone insomnia   Buprenorphine     Other Reaction(s): Not available   Buprenorphine  Hcl-naloxone  Hcl    Duloxetine Other (See Comments)   Unknown Unknown   Fluoxetine    Other Reaction(s): Not available   Ketorolac Tromethamine    Lithium    drool   Ropinirole    Other Reaction(s): Not available   Tramadol    Other reaction(s): Other (See Comments) Seizures   Poison Ivy Extract [poison Ivy Extract] Rash   Poison Oak Extract [poison Oak Extract] Rash   Sumac  Rash        Medication List     STOP taking these medications    lacosamide  50 MG Tabs tablet Commonly known as: VIMPAT    levETIRAcetam  1000 MG tablet Commonly known as: KEPPRA    PARoxetine  20 MG tablet Commonly known as: PAXIL        TAKE these medications      Indication  carbamazepine  200 MG tablet Commonly known as: TEGRETOL  Take 1 tablet (200 mg total) by mouth 2 (two) times daily.  Indication: Manic-Depression   clonazePAM  0.5 MG tablet Commonly known as: KLONOPIN  Take 1 tablet (0.5 mg total) by mouth 2 (two) times daily as needed (Anxiety).  Indication: Feeling Anxious   hydrOXYzine  25 MG tablet Commonly known as: ATARAX  Take 1 tablet (25 mg total) by mouth 3 (three) times daily as needed for anxiety (Sleep).  Indication: Feeling Anxious   nicotine  21 mg/24hr patch Commonly known as: NICODERM CQ  - dosed in mg/24 hours Place 1 patch (21 mg total) onto the skin daily.  Indication: Nicotine  Addiction   OLANZapine  zydis 15 MG disintegrating tablet Commonly known as: ZYPREXA  Take 1 tablet (15 mg total) by mouth at bedtime.  Indication: MIXED BIPOLAR AFFECTIVE DISORDER   sertraline  50 MG tablet Commonly known as: ZOLOFT  Take 1 tablet (50 mg total) by mouth daily.  Indication: Major Depressive Disorder   traZODone  50 MG tablet Commonly known as: DESYREL  Take 1 tablet (50 mg total) by mouth at bedtime as needed for sleep.  Indication: Trouble Sleeping        Follow-up Information     [facility] Follow up.   Why: Appointment is scheduled for 06/24/24 at 10:30am. Contact information: [address]  [address] [place] [address] [phone]                 Follow-up recommendations:  Activity:  As tolerated    Signed: Kirah Stice, MD 06/18/2024, 10:48 AM          [1]  Social History Tobacco Use  Smoking Status Every Day   Current packs/day: 1.50   Types: Cigarettes  Smokeless Tobacco Never   "
